# Patient Record
Sex: Female | Born: 1965 | Race: Black or African American | Hispanic: No | Marital: Married | Smoking: Never smoker
Health system: Southern US, Community
[De-identification: ages and names within clinical notes are randomized; demographics above are authoritative.]

## PROBLEM LIST (undated history)

## (undated) DIAGNOSIS — C73 Malignant neoplasm of thyroid gland: Secondary | ICD-10-CM

## (undated) DIAGNOSIS — T7840XA Allergy, unspecified, initial encounter: Secondary | ICD-10-CM

## (undated) DIAGNOSIS — E079 Disorder of thyroid, unspecified: Secondary | ICD-10-CM

## (undated) DIAGNOSIS — E039 Hypothyroidism, unspecified: Secondary | ICD-10-CM

## (undated) DIAGNOSIS — M25569 Pain in unspecified knee: Secondary | ICD-10-CM

## (undated) DIAGNOSIS — M1990 Unspecified osteoarthritis, unspecified site: Secondary | ICD-10-CM

## (undated) DIAGNOSIS — J45909 Unspecified asthma, uncomplicated: Secondary | ICD-10-CM

## (undated) DIAGNOSIS — IMO0001 Reserved for inherently not codable concepts without codable children: Secondary | ICD-10-CM

## (undated) DIAGNOSIS — R233 Spontaneous ecchymoses: Secondary | ICD-10-CM

## (undated) DIAGNOSIS — M797 Fibromyalgia: Secondary | ICD-10-CM

## (undated) DIAGNOSIS — G4730 Sleep apnea, unspecified: Secondary | ICD-10-CM

## (undated) DIAGNOSIS — R51 Headache: Secondary | ICD-10-CM

## (undated) DIAGNOSIS — M21619 Bunion of unspecified foot: Secondary | ICD-10-CM

## (undated) DIAGNOSIS — O009 Unspecified ectopic pregnancy without intrauterine pregnancy: Secondary | ICD-10-CM

## (undated) DIAGNOSIS — R238 Other skin changes: Secondary | ICD-10-CM

## (undated) DIAGNOSIS — R5383 Other fatigue: Secondary | ICD-10-CM

## (undated) DIAGNOSIS — M722 Plantar fascial fibromatosis: Secondary | ICD-10-CM

## (undated) DIAGNOSIS — C801 Malignant (primary) neoplasm, unspecified: Secondary | ICD-10-CM

## (undated) DIAGNOSIS — Z9289 Personal history of other medical treatment: Secondary | ICD-10-CM

## (undated) DIAGNOSIS — K219 Gastro-esophageal reflux disease without esophagitis: Secondary | ICD-10-CM

## (undated) DIAGNOSIS — D649 Anemia, unspecified: Secondary | ICD-10-CM

## (undated) HISTORY — PX: OTHER SURGICAL HISTORY: SHX169

## (undated) HISTORY — PX: ECTOPIC PREGNANCY SURGERY: SHX613

## (undated) HISTORY — DX: Bunion of unspecified foot: M21.619

## (undated) HISTORY — DX: Disorder of thyroid, unspecified: E07.9

## (undated) HISTORY — DX: Fibromyalgia: M79.7

## (undated) HISTORY — DX: Plantar fascial fibromatosis: M72.2

## (undated) HISTORY — DX: Malignant neoplasm of thyroid gland: C73

## (undated) HISTORY — PX: BUNIONECTOMY: SHX129

## (undated) HISTORY — DX: Unspecified osteoarthritis, unspecified site: M19.90

## (undated) HISTORY — DX: Reserved for inherently not codable concepts without codable children: IMO0001

## (undated) HISTORY — DX: Sleep apnea, unspecified: G47.30

## (undated) HISTORY — DX: Allergy, unspecified, initial encounter: T78.40XA

## (undated) HISTORY — DX: Gastro-esophageal reflux disease without esophagitis: K21.9

## (undated) HISTORY — PX: TUBAL LIGATION: SHX77

## (undated) HISTORY — DX: Other fatigue: R53.83

## (undated) HISTORY — DX: Pain in unspecified knee: M25.569

## (undated) HISTORY — PX: WISDOM TOOTH EXTRACTION: SHX21

## (undated) HISTORY — PX: COLONOSCOPY: SHX174

## (undated) HISTORY — DX: Other skin changes: R23.8

## (undated) HISTORY — DX: Spontaneous ecchymoses: R23.3

## (undated) HISTORY — DX: Unspecified ectopic pregnancy without intrauterine pregnancy: O00.90

---

## 1986-10-28 HISTORY — PX: DILATION AND CURETTAGE OF UTERUS: SHX78

## 1993-10-28 HISTORY — PX: THYROIDECTOMY: SHX17

## 1998-03-29 ENCOUNTER — Other Ambulatory Visit: Admission: RE | Admit: 1998-03-29 | Discharge: 1998-03-29 | Payer: Self-pay | Admitting: Obstetrics & Gynecology

## 1998-10-17 ENCOUNTER — Emergency Department (HOSPITAL_COMMUNITY): Admission: EM | Admit: 1998-10-17 | Discharge: 1998-10-17 | Payer: Self-pay | Admitting: Emergency Medicine

## 1998-10-28 DIAGNOSIS — Z9289 Personal history of other medical treatment: Secondary | ICD-10-CM

## 1998-10-28 HISTORY — DX: Personal history of other medical treatment: Z92.89

## 1999-01-15 ENCOUNTER — Ambulatory Visit (HOSPITAL_COMMUNITY): Admission: RE | Admit: 1999-01-15 | Discharge: 1999-01-15 | Payer: Self-pay | Admitting: Endocrinology

## 1999-01-17 ENCOUNTER — Ambulatory Visit (HOSPITAL_COMMUNITY): Admission: RE | Admit: 1999-01-17 | Discharge: 1999-01-17 | Payer: Self-pay | Admitting: Endocrinology

## 1999-01-18 ENCOUNTER — Encounter: Payer: Self-pay | Admitting: Endocrinology

## 1999-01-19 ENCOUNTER — Ambulatory Visit (HOSPITAL_COMMUNITY): Admission: RE | Admit: 1999-01-19 | Discharge: 1999-01-19 | Payer: Self-pay | Admitting: Endocrinology

## 1999-01-19 ENCOUNTER — Encounter: Payer: Self-pay | Admitting: Endocrinology

## 1999-01-23 ENCOUNTER — Ambulatory Visit (HOSPITAL_COMMUNITY): Admission: RE | Admit: 1999-01-23 | Discharge: 1999-01-23 | Payer: Self-pay | Admitting: General Surgery

## 1999-01-23 ENCOUNTER — Encounter: Payer: Self-pay | Admitting: General Surgery

## 1999-04-12 ENCOUNTER — Other Ambulatory Visit: Admission: RE | Admit: 1999-04-12 | Discharge: 1999-04-12 | Payer: Self-pay | Admitting: Obstetrics & Gynecology

## 1999-08-06 ENCOUNTER — Ambulatory Visit (HOSPITAL_COMMUNITY): Admission: RE | Admit: 1999-08-06 | Discharge: 1999-08-06 | Payer: Self-pay | Admitting: Endocrinology

## 1999-08-10 ENCOUNTER — Ambulatory Visit (HOSPITAL_COMMUNITY): Admission: RE | Admit: 1999-08-10 | Discharge: 1999-08-10 | Payer: Self-pay | Admitting: Endocrinology

## 1999-08-10 ENCOUNTER — Encounter: Payer: Self-pay | Admitting: Endocrinology

## 2000-07-15 ENCOUNTER — Ambulatory Visit (HOSPITAL_COMMUNITY): Admission: RE | Admit: 2000-07-15 | Discharge: 2000-07-15 | Payer: Self-pay | Admitting: Obstetrics and Gynecology

## 2000-07-15 ENCOUNTER — Encounter: Payer: Self-pay | Admitting: Obstetrics and Gynecology

## 2000-08-01 ENCOUNTER — Encounter: Payer: Self-pay | Admitting: Obstetrics and Gynecology

## 2000-08-01 ENCOUNTER — Ambulatory Visit (HOSPITAL_COMMUNITY): Admission: RE | Admit: 2000-08-01 | Discharge: 2000-08-01 | Payer: Self-pay | Admitting: Obstetrics and Gynecology

## 2000-08-28 ENCOUNTER — Other Ambulatory Visit: Admission: RE | Admit: 2000-08-28 | Discharge: 2000-08-28 | Payer: Self-pay | Admitting: Obstetrics and Gynecology

## 2000-09-15 ENCOUNTER — Encounter: Payer: Self-pay | Admitting: Endocrinology

## 2000-09-15 ENCOUNTER — Ambulatory Visit (HOSPITAL_COMMUNITY): Admission: RE | Admit: 2000-09-15 | Discharge: 2000-09-15 | Payer: Self-pay | Admitting: Endocrinology

## 2001-09-01 ENCOUNTER — Emergency Department (HOSPITAL_COMMUNITY): Admission: EM | Admit: 2001-09-01 | Discharge: 2001-09-01 | Payer: Self-pay | Admitting: Emergency Medicine

## 2003-04-04 ENCOUNTER — Encounter: Payer: Self-pay | Admitting: Obstetrics and Gynecology

## 2003-04-04 ENCOUNTER — Ambulatory Visit (HOSPITAL_COMMUNITY): Admission: RE | Admit: 2003-04-04 | Discharge: 2003-04-04 | Payer: Self-pay | Admitting: Obstetrics and Gynecology

## 2003-05-04 ENCOUNTER — Ambulatory Visit (HOSPITAL_COMMUNITY): Admission: RE | Admit: 2003-05-04 | Discharge: 2003-05-04 | Payer: Self-pay | Admitting: Obstetrics and Gynecology

## 2003-05-04 ENCOUNTER — Encounter: Payer: Self-pay | Admitting: Obstetrics and Gynecology

## 2003-05-25 ENCOUNTER — Other Ambulatory Visit: Admission: RE | Admit: 2003-05-25 | Discharge: 2003-05-25 | Payer: Self-pay | Admitting: Obstetrics and Gynecology

## 2003-06-28 ENCOUNTER — Emergency Department (HOSPITAL_COMMUNITY): Admission: EM | Admit: 2003-06-28 | Discharge: 2003-06-29 | Payer: Self-pay | Admitting: Emergency Medicine

## 2003-06-29 ENCOUNTER — Encounter: Payer: Self-pay | Admitting: Emergency Medicine

## 2004-08-06 ENCOUNTER — Encounter (HOSPITAL_COMMUNITY): Admission: RE | Admit: 2004-08-06 | Discharge: 2004-11-04 | Payer: Self-pay | Admitting: Surgery

## 2004-08-31 ENCOUNTER — Ambulatory Visit (HOSPITAL_COMMUNITY): Admission: RE | Admit: 2004-08-31 | Discharge: 2004-08-31 | Payer: Self-pay | Admitting: Obstetrics and Gynecology

## 2004-12-13 ENCOUNTER — Emergency Department (HOSPITAL_COMMUNITY): Admission: EM | Admit: 2004-12-13 | Discharge: 2004-12-13 | Payer: Self-pay | Admitting: Emergency Medicine

## 2004-12-21 ENCOUNTER — Ambulatory Visit: Payer: Self-pay | Admitting: Internal Medicine

## 2005-01-15 ENCOUNTER — Ambulatory Visit: Payer: Self-pay | Admitting: Internal Medicine

## 2006-07-28 ENCOUNTER — Encounter (HOSPITAL_COMMUNITY): Admission: RE | Admit: 2006-07-28 | Discharge: 2006-10-26 | Payer: Self-pay | Admitting: Endocrinology

## 2008-10-13 ENCOUNTER — Telehealth: Payer: Self-pay | Admitting: Internal Medicine

## 2008-10-14 ENCOUNTER — Ambulatory Visit: Payer: Self-pay | Admitting: Gastroenterology

## 2008-10-14 DIAGNOSIS — R198 Other specified symptoms and signs involving the digestive system and abdomen: Secondary | ICD-10-CM | POA: Insufficient documentation

## 2008-10-14 DIAGNOSIS — K625 Hemorrhage of anus and rectum: Secondary | ICD-10-CM | POA: Insufficient documentation

## 2008-10-24 ENCOUNTER — Telehealth: Payer: Self-pay | Admitting: Internal Medicine

## 2008-10-26 ENCOUNTER — Ambulatory Visit: Payer: Self-pay | Admitting: Internal Medicine

## 2008-10-26 ENCOUNTER — Encounter: Payer: Self-pay | Admitting: Internal Medicine

## 2008-10-26 DIAGNOSIS — D649 Anemia, unspecified: Secondary | ICD-10-CM | POA: Insufficient documentation

## 2008-11-02 LAB — CONVERTED CEMR LAB
Basophils Absolute: 0 10*3/uL (ref 0.0–0.1)
Basophils Relative: 0 % (ref 0.0–3.0)
Eosinophils Absolute: 0.1 10*3/uL (ref 0.0–0.7)
Eosinophils Relative: 0.8 % (ref 0.0–5.0)
HCT: 30.7 % — ABNORMAL LOW (ref 36.0–46.0)
Hemoglobin: 9.9 g/dL — ABNORMAL LOW (ref 12.0–15.0)
Lymphocytes Relative: 27.3 % (ref 12.0–46.0)
MCHC: 32.3 g/dL (ref 30.0–36.0)
MCV: 70.9 fL — ABNORMAL LOW (ref 78.0–100.0)
Monocytes Absolute: 0.4 10*3/uL (ref 0.1–1.0)
Monocytes Relative: 3.9 % (ref 3.0–12.0)
Neutro Abs: 7.6 10*3/uL (ref 1.4–7.7)
Neutrophils Relative %: 68 % (ref 43.0–77.0)
Platelets: 389 10*3/uL (ref 150–400)
RBC: 4.33 M/uL (ref 3.87–5.11)
RDW: 17.7 % — ABNORMAL HIGH (ref 11.5–14.6)
WBC: 11.2 10*3/uL — ABNORMAL HIGH (ref 4.5–10.5)

## 2008-11-03 ENCOUNTER — Encounter: Payer: Self-pay | Admitting: Internal Medicine

## 2008-11-08 ENCOUNTER — Telehealth: Payer: Self-pay | Admitting: Internal Medicine

## 2008-11-10 ENCOUNTER — Ambulatory Visit: Payer: Self-pay | Admitting: Internal Medicine

## 2008-11-22 ENCOUNTER — Telehealth: Payer: Self-pay | Admitting: Internal Medicine

## 2008-11-24 ENCOUNTER — Ambulatory Visit: Payer: Self-pay | Admitting: Internal Medicine

## 2008-11-24 ENCOUNTER — Encounter: Payer: Self-pay | Admitting: Internal Medicine

## 2009-12-26 ENCOUNTER — Encounter: Admission: RE | Admit: 2009-12-26 | Discharge: 2009-12-26 | Payer: Self-pay | Admitting: Internal Medicine

## 2009-12-28 ENCOUNTER — Encounter: Admission: RE | Admit: 2009-12-28 | Discharge: 2009-12-28 | Payer: Self-pay | Admitting: Internal Medicine

## 2010-11-17 ENCOUNTER — Encounter: Payer: Self-pay | Admitting: Surgery

## 2010-11-18 ENCOUNTER — Encounter: Payer: Self-pay | Admitting: Endocrinology

## 2011-01-16 ENCOUNTER — Other Ambulatory Visit: Payer: Self-pay | Admitting: Internal Medicine

## 2011-01-16 DIAGNOSIS — Z1231 Encounter for screening mammogram for malignant neoplasm of breast: Secondary | ICD-10-CM

## 2011-01-23 ENCOUNTER — Ambulatory Visit: Payer: Self-pay

## 2011-02-08 ENCOUNTER — Inpatient Hospital Stay: Admission: RE | Admit: 2011-02-08 | Payer: Self-pay | Source: Ambulatory Visit

## 2011-05-29 DIAGNOSIS — M722 Plantar fascial fibromatosis: Secondary | ICD-10-CM

## 2011-07-02 ENCOUNTER — Other Ambulatory Visit: Payer: Self-pay | Admitting: Orthopedic Surgery

## 2011-07-02 DIAGNOSIS — M25561 Pain in right knee: Secondary | ICD-10-CM

## 2011-07-07 ENCOUNTER — Ambulatory Visit
Admission: RE | Admit: 2011-07-07 | Discharge: 2011-07-07 | Disposition: A | Discharge status: Home / Self Care | Payer: BC Managed Care – PPO | Source: Ambulatory Visit | Attending: Orthopedic Surgery | Admitting: Orthopedic Surgery

## 2011-07-07 DIAGNOSIS — M25561 Pain in right knee: Secondary | ICD-10-CM

## 2011-08-21 ENCOUNTER — Other Ambulatory Visit: Payer: Self-pay | Admitting: Internal Medicine

## 2011-08-21 DIAGNOSIS — N644 Mastodynia: Secondary | ICD-10-CM

## 2011-08-30 ENCOUNTER — Ambulatory Visit (INDEPENDENT_AMBULATORY_CARE_PROVIDER_SITE_OTHER): Payer: BC Managed Care – PPO | Admitting: Surgery

## 2011-08-30 NOTE — Progress Notes (Signed)
Chief Complaint:  Morbid obesity, BMI 45 with DM  History of Present Illness:  Dana Vasquez is an 45 y.o. female referred by Dr. Kellie Shropshire for Roux-en-Y gastric bypass. Ms. Alen has been to one of our some enlarged and has done research on this and seems very well versed in the procedure. I discussed in some detail which she had a good comprehension and was to proceed with a workup for scheduling.  Her history of obesity may have been augmented by her bout with thyroid cancer requiring radioactive iodine ablation and subsequent thyroidectomy leaving her hypothyroid. Her surgeon was Dr. Gerrit Friends.  She also had a ruptured tubal pregnancy in 2001 requiring left tubal removal. As her only abdominal surgery.   Past Medical History  Diagnosis Date  . Fibromyalgia   . Euthyroid   . Fatigue   . GERD (gastroesophageal reflux disease)   . Bruises easily     Past Surgical History  Procedure Date  . Tubal ligation   . Fallopian tubes removed   . Thyroidectomy     Medications Prior to Admission  Medication Sig Dispense Refill  . Ferrous Sulfate (IRON CR PO) Take by mouth.        . Levothyroxine Sodium (SYNTHROID PO) Take by mouth.        . Nutritional Supplements (VITAMIN D BOOSTER PO) Take by mouth.         No current facility-administered medications on file as of 08/30/2011.     Allergies no known allergies   No family history on file.  Social History:   has an unknown smoking status. She does not have any smokeless tobacco history on file. She reports that she does not drink alcohol. Her drug history not on file.   REVIEW OF SYSTEMS - PERTINENT POSITIVES ONLY: Review of systems is positive for weight change, loss of sleep and fatigue, breast pain, high blood pressure, bronchitis, shortness of breath, diabetes, thyroid disease, hiatal hernia with reflux, rectal bleeding or hemorrhoids, headaches, anemia, prior thyroid cancer, sinus problems runny nose, hoarseness, glasses,  obstructive sleep apnea requiring CPAP machine.  Physical Exam:   Blood pressure 122/82, pulse 88, temperature 97.4 F (36.3 C), temperature source Temporal, height 5\' 5"  (1.651 m), weight 270 lb 6 oz (122.641 kg). Body mass index is 44.99 kg/(m^2).  Gen:  No acute distress.  Well nourished and well groomed.   Neurological: Alert and oriented to person, place, and time. Coordination normal.  Head: Normocephalic and atraumatic.  Eyes: Conjunctivae are normal. Pupils are equal, round, and reactive to light. No scleral icterus.  Neck: Normal range of motion. Neck supple. No tracheal deviation or thyromegaly present.  Cardiovascular: Normal rate, regular rhythm, normal heart sounds and intact distal pulses.  Exam reveals no gallop and no friction rub.  No murmur heard. Respiratory: Effort normal.  No respiratory distress. No chest wall tenderness. Breath sounds normal.  No wheezes, rales or rhonchi.  GI: Soft. Bowel sounds are normal. The abdomen is soft and nontender.  There is no rebound and no guarding.  Musculoskeletal: Normal range of motion. Extremities are nontender.  Lymphadenopathy: No cervical, preauricular, postauricular or axillary adenopathy is present Skin: Skin is warm and dry. No rash noted. No diaphoresis. No erythema. No pallor. No clubbing, cyanosis, or edema.   Psychiatric: Normal mood and affect. Behavior is normal. Judgment and thought content normal.    LABORATORY RESULTS: No results found for this or any previous visit (from the past 48 hour(s)).  RADIOLOGY  RESULTS: No results found.  Problem List: Active Problems:  * No active hospital problems. *    Assessment & Plan: Morbid obesity with multiple comorbidities. Plan begin workup for Roux-en-Y gastric bypass.    Matt B. Daphine Deutscher, MD, [facility] [phone] beeper [phone]  08/30/2011 5:48 PM

## 2011-09-02 ENCOUNTER — Other Ambulatory Visit (INDEPENDENT_AMBULATORY_CARE_PROVIDER_SITE_OTHER): Payer: Self-pay | Admitting: Surgery

## 2011-09-10 ENCOUNTER — Encounter: Payer: Self-pay | Admitting: *Deleted

## 2011-09-10 ENCOUNTER — Telehealth (INDEPENDENT_AMBULATORY_CARE_PROVIDER_SITE_OTHER): Payer: Self-pay | Admitting: Surgery

## 2011-09-10 ENCOUNTER — Encounter: Payer: BC Managed Care – PPO | Attending: Surgery | Admitting: *Deleted

## 2011-09-10 DIAGNOSIS — Z01818 Encounter for other preprocedural examination: Secondary | ICD-10-CM | POA: Insufficient documentation

## 2011-09-10 DIAGNOSIS — Z713 Dietary counseling and surveillance: Secondary | ICD-10-CM | POA: Insufficient documentation

## 2011-09-10 NOTE — Patient Instructions (Signed)
   Follow Pre-Op Nutrition Goals to prepare for Gastric Bypass Surgery.   Call the [facility] at [phone] once you have been given your surgery date to enrolled in the Pre-Op Nutrition Class. You will need to attend this nutrition class 3-4 weeks prior to your surgery. 

## 2011-09-10 NOTE — Telephone Encounter (Signed)
Hi Trace/Dr Daphine Deutscher,  I received the following email from the patient. I advised pt that the nutritionist will go over many of the items mentioned concerning meal plans/sizes, eating habits, weight loss goals, etc. Also advised the patient that we will be more than happy to schedule an appt with Dr Daphine Deutscher for her to discuss the consent and the concerns she lists in the email if she would like. Or, she may ask all of those questions at her pre-op appt and then sign the consent form. I also advised the patient that the surgeons normally let the patient decide which procedure they prefer because they have to live with it for the rest of their lives. Adv pt to call if she would like to set up an appt with Dr. Daphine Deutscher prior to pre-op. Okey Regal  From: [email or web address] Sent: Monday, September 09, 2011 7:25 AM To: Leandrew Koyanagi Subject: RE: Appointment Sheet  [name],  I did reschedule and I also received the paperwork in the mail. I do, however, have some concerns. I do not feel comfortable initialing the second section of the Consent Form as yet. I did have an appointment with Dr. Daphine Deutscher a couple weeks ago, but we did not discuss any of the items mentioned in detail. In fact, my time with him was all of 10 minutes after being there at least 3 hours. I was very disappointed about the visit. Was this the time that we should have discussed the items listed on the consent form? Dr. Daphine Deutscher did thank me for a thorough medical history and asked me if I had any questions, but I had been very anxious for the opportunity for someone to personnally address me as to how this surgery may or may not be my best choice for weight loss. I wasn't asked why I chose the gastric bypass; nor was any other recommendations made or presented as it pertained to ME. We did not discuss my eating habits, my future weight loss goals, personal limits regarding acceptable meal size, or other options that are  non-surgical other than the fact that I've probably tried almost every diet or weight-reducing plan of which I know.  This is a major decision for me and I want to make sure that it's the right one for me. I've read a lot and have attended the information session at [facility]. I understand the generalities, but just want my Surgeon to be able to understand my individual plight.  Please advise.  Sincerely, Dana Vasquez

## 2011-09-10 NOTE — Progress Notes (Signed)
  Pre-Op Assessment Visit: Pre-Operative Gastric Bypass Surgery  Medical Nutrition Therapy:  Appt start time: 1730 end time:  1830.  Patient was seen on 09/10/2011 for Pre-Operative Gastric Bypass Nutrition Assessment. Assessment and letter of approval faxed to [facility] B[facility]ric Surgery Program coordinator on 09/10/2011.  Approval letter sent to [facility] center and will be available in the chart under the media tab.  Handouts given during visit include:  Pre-Op Goals Handout  Patient to call for Pre-Op and Post-Op Nutrition Education at the [facility] when surgery is scheduled.

## 2011-09-12 ENCOUNTER — Ambulatory Visit: Payer: BC Managed Care – PPO | Admitting: *Deleted

## 2011-09-18 ENCOUNTER — Encounter (HOSPITAL_COMMUNITY)
Admission: RE | Disposition: A | Discharge status: Home / Self Care | Payer: Self-pay | Source: Ambulatory Visit | Attending: Surgery

## 2011-09-18 ENCOUNTER — Ambulatory Visit (HOSPITAL_COMMUNITY)
Admission: RE | Admit: 2011-09-18 | Discharge: 2011-09-18 | Disposition: A | Discharge status: Home / Self Care | Payer: BC Managed Care – PPO | Source: Ambulatory Visit | Attending: Surgery | Admitting: Surgery

## 2011-09-18 DIAGNOSIS — Z01818 Encounter for other preprocedural examination: Secondary | ICD-10-CM | POA: Insufficient documentation

## 2011-09-18 HISTORY — PX: BREATH TEK H PYLORI: SHX5422

## 2011-09-18 SURGERY — BREATH TEST, FOR HELICOBACTER PYLORI
Anesthesia: Choice

## 2011-09-23 ENCOUNTER — Encounter (HOSPITAL_COMMUNITY): Payer: Self-pay

## 2011-09-23 ENCOUNTER — Ambulatory Visit (HOSPITAL_COMMUNITY)
Admission: RE | Admit: 2011-09-23 | Discharge: 2011-09-23 | Disposition: A | Discharge status: Home / Self Care | Payer: BC Managed Care – PPO | Source: Ambulatory Visit | Attending: Surgery | Admitting: Surgery

## 2011-09-23 ENCOUNTER — Other Ambulatory Visit: Payer: Self-pay

## 2011-09-23 DIAGNOSIS — K449 Diaphragmatic hernia without obstruction or gangrene: Secondary | ICD-10-CM | POA: Insufficient documentation

## 2011-09-23 DIAGNOSIS — Z6841 Body Mass Index (BMI) 40.0 and over, adult: Secondary | ICD-10-CM | POA: Insufficient documentation

## 2011-09-23 DIAGNOSIS — IMO0001 Reserved for inherently not codable concepts without codable children: Secondary | ICD-10-CM | POA: Insufficient documentation

## 2011-09-23 DIAGNOSIS — K219 Gastro-esophageal reflux disease without esophagitis: Secondary | ICD-10-CM | POA: Insufficient documentation

## 2011-09-23 DIAGNOSIS — G4730 Sleep apnea, unspecified: Secondary | ICD-10-CM | POA: Insufficient documentation

## 2011-09-23 DIAGNOSIS — R7309 Other abnormal glucose: Secondary | ICD-10-CM | POA: Insufficient documentation

## 2011-09-24 ENCOUNTER — Encounter (HOSPITAL_COMMUNITY): Payer: Self-pay | Admitting: Surgery

## 2011-10-03 ENCOUNTER — Ambulatory Visit
Admission: RE | Admit: 2011-10-03 | Discharge: 2011-10-03 | Disposition: A | Discharge status: Home / Self Care | Payer: BC Managed Care – PPO | Source: Ambulatory Visit | Attending: Internal Medicine | Admitting: Internal Medicine

## 2011-10-03 DIAGNOSIS — N644 Mastodynia: Secondary | ICD-10-CM

## 2011-10-10 ENCOUNTER — Other Ambulatory Visit (INDEPENDENT_AMBULATORY_CARE_PROVIDER_SITE_OTHER): Payer: Self-pay | Admitting: Surgery

## 2011-10-10 LAB — COMPREHENSIVE METABOLIC PANEL
AST: 11 U/L (ref 0–37)
Albumin: 4 g/dL (ref 3.5–5.2)
Alkaline Phosphatase: 65 U/L (ref 39–117)
BUN: 14 mg/dL (ref 6–23)
Potassium: 4.3 mEq/L (ref 3.5–5.3)

## 2011-10-10 LAB — LIPID PANEL
HDL: 45 mg/dL (ref >39)
LDL Cholesterol: 113 mg/dL — ABNORMAL HIGH (ref 0–99)
Total CHOL/HDL Ratio: 3.7 Ratio

## 2011-10-10 LAB — T4: T4, Total: 10.8 ug/dL (ref 5.0–12.5)

## 2011-10-11 LAB — CBC WITH DIFFERENTIAL/PLATELET
Basophils Absolute: 0 10*3/uL (ref 0.0–0.1)
Basophils Relative: 0 % (ref 0–1)
HCT: 31.6 % — ABNORMAL LOW (ref 36.0–46.0)
MCHC: 31 g/dL (ref 30.0–36.0)
Monocytes Absolute: 1 10*3/uL (ref 0.1–1.0)
Neutro Abs: 7.9 10*3/uL — ABNORMAL HIGH (ref 1.7–7.7)
Neutrophils Relative %: 69 % (ref 43–77)
Platelets: 477 10*3/uL — ABNORMAL HIGH (ref 150–400)
RDW: 19.3 % — ABNORMAL HIGH (ref 11.5–15.5)

## 2011-10-11 LAB — HEMOGLOBIN A1C: Mean Plasma Glucose: 131 mg/dL — ABNORMAL HIGH (ref <117)

## 2011-10-18 ENCOUNTER — Ambulatory Visit (INDEPENDENT_AMBULATORY_CARE_PROVIDER_SITE_OTHER): Payer: BC Managed Care – PPO | Admitting: General Surgery

## 2011-10-18 ENCOUNTER — Encounter (INDEPENDENT_AMBULATORY_CARE_PROVIDER_SITE_OTHER): Payer: Self-pay | Admitting: General Surgery

## 2011-10-18 LAB — IRON AND TIBC
%SAT: 7 % — ABNORMAL LOW (ref 20–55)
TIBC: 339 ug/dL (ref 250–470)

## 2011-10-18 NOTE — Progress Notes (Signed)
Patient ID: Dana Vasquez, female   DOB: 08/19/1966, 45 y.o.   MRN: [id number]  Chief Complaint  Patient presents with  . Other    Eval for Gastric Sleeve    HPI Dana Vasquez is a 45 y.o. female. This patient presents for evaluation for weight loss surgery. She has a BMI of 45 and originally saw Dr. Daphine Deutscher for possible Roux-en-Y gastric bypass. After further research she is interested in the sleeve gastrectomy. She has multiple medical problems and her main concern for the gastric bypass is her history of iron deficiency anemia. Her hemoglobin runs from the 7-9 range. She takes iron supplements now as well as vitamin D injections and is concerned that with gastric bypass her iron levels might be too low. She has attended our weight loss surgery similar and has done some independent research. She is not interested in the LAP-BAND because she is concerned of its lack of effectiveness. She's not currently exercising although she has tried multiple diet attempts. She is basically ready for surgery and his argument with a psychologist in the nutritionist. HPI  Past Medical History  Diagnosis Date  . Fibromyalgia   . Euthyroid   . Fatigue   . GERD (gastroesophageal reflux disease)   . Bruises easily   . Sleep apnea   . Knee pain   . Arthritis     Past Surgical History  Procedure Date  . Tubal ligation   . Fallopian tubes removed   . Thyroidectomy   . Breath tek h pylori 09/18/2011    Procedure: BREATH TEK H PYLORI;  Surgeon: Valarie Merino, MD;  Location: [facility];  Service: General;  Laterality: N/A;    No family history on file.  Social History History  Substance Use Topics  . Smoking status: Never Smoker   . Smokeless tobacco: Not on file  . Alcohol Use: No    No Known Allergies  Current Outpatient Prescriptions  Medication Sig Dispense Refill  . Ferrous Sulfate (IRON CR PO) Take by mouth.        . Levothyroxine Sodium (SYNTHROID PO) Take by mouth.        .  Nutritional Supplements (VITAMIN D BOOSTER PO) Take by mouth.          Review of Systems Review of Systems  Blood pressure 148/92, pulse 72, temperature 97.1 F (36.2 C), temperature source Temporal, resp. rate 18, height 5\' 5"  (1.651 m), weight 268 lb 6.4 oz (121.745 kg), last menstrual period 09/22/2011.  Physical Exam Physical Exam  Vitals reviewed. Constitutional: She appears well-developed and well-nourished. No distress.  HENT:  Head: Normocephalic and atraumatic.  Mouth/Throat: No oropharyngeal exudate.  Eyes: Conjunctivae are normal. Pupils are equal, round, and reactive to light. Right eye exhibits no discharge. Left eye exhibits no discharge. No scleral icterus.  Neck: Normal range of motion. No tracheal deviation present.       Prior thyroidectomy scar   Cardiovascular: Normal rate.   Pulmonary/Chest: Effort normal. No stridor. No respiratory distress. She has no wheezes.  Abdominal: Soft. She exhibits no distension. There is no tenderness. There is no rebound and no guarding.  Musculoskeletal: Normal range of motion. She exhibits no edema.  Neurological: She is alert. No cranial nerve deficit. Coordination normal.  Skin: Skin is warm and dry. No rash noted. She is not diaphoretic. No erythema. No pallor.  Psychiatric: She has a normal mood and affect. Her behavior is normal. Judgment and thought content normal.  Data Reviewed   Assessment    Morbid obesity with sleep apnea and hypothyroidism. She states that she is borderline diabetic and borderline hypertensive. I think that she would be a fine candidate for any of the procedures including the lap band, or sleeve gastrectomy.she would likely be fine with gastric bypass as well. Her concerns are legitimate about her prior iron deficiency anemia and this certainly could be exacerbated by any of the weight loss procedures and with the greatest effect from the gastric bypass. She has very mild reflux she says she has  reflux symptoms approximately once per month and she does not take anything routinely for this. I have offered her sleeve gastrectomy or lap bandor even a gastric bypass. I think that she is most interested in the sleeve gastrectomy.    Plan    I recommended that she do some additional research in order to help make a decision and continue with preoperative workup. She already has had an upper GI with a small hiatal hernia which should not affect her choice of surgery of procedures since this would be easily repaired at the time of surgery. I will also check an iron profile and vitamin D levels in preparation for surgery to think that's again she should be adequate for a sleeve gastrectomy if she desires. I did explain the risks of the surgery including infection, bleeding, pain, scarring, need for open surgery, injury to the spleen or straining structures, risk of leaks and benefits, stricture, reflux and the need for possible medication or conversion to Roux-en-Y gastric bypass, and the need for future surgeries. We also discussed the irreversible nature of this procedure and we will continue with the workup and she desires to proceed with sleeve gastrectomy we will schedule this when her further workup is complete.       Lodema Pilot DAVID 10/18/2011, 4:16 PM

## 2011-11-06 ENCOUNTER — Other Ambulatory Visit (INDEPENDENT_AMBULATORY_CARE_PROVIDER_SITE_OTHER): Payer: Self-pay | Admitting: General Surgery

## 2011-11-14 ENCOUNTER — Ambulatory Visit: Payer: BC Managed Care – PPO

## 2012-02-12 ENCOUNTER — Ambulatory Visit (INDEPENDENT_AMBULATORY_CARE_PROVIDER_SITE_OTHER): Payer: PRIVATE HEALTH INSURANCE | Admitting: Obstetrics and Gynecology

## 2012-02-12 ENCOUNTER — Encounter: Payer: Self-pay | Admitting: Obstetrics and Gynecology

## 2012-02-12 VITALS — BP 122/80 | HR 76 | Wt 264.0 lb

## 2012-02-12 DIAGNOSIS — N920 Excessive and frequent menstruation with regular cycle: Secondary | ICD-10-CM | POA: Insufficient documentation

## 2012-02-12 DIAGNOSIS — D219 Benign neoplasm of connective and other soft tissue, unspecified: Secondary | ICD-10-CM

## 2012-02-12 DIAGNOSIS — Z833 Family history of diabetes mellitus: Secondary | ICD-10-CM

## 2012-02-12 DIAGNOSIS — Z8759 Personal history of other complications of pregnancy, childbirth and the puerperium: Secondary | ICD-10-CM | POA: Insufficient documentation

## 2012-02-12 DIAGNOSIS — D259 Leiomyoma of uterus, unspecified: Secondary | ICD-10-CM

## 2012-02-12 NOTE — Patient Instructions (Signed)

## 2012-02-12 NOTE — Progress Notes (Signed)
The patient reports:she wantsf to discuss the removal of the iud.  Contraception:Tubal ligation  Last mammogram: was normal December 2012 Last pap: was normal August  2012  GC/Chlamydia cultures offered: declined HIV/RPR/HbsAg offered:  declined HSV 1 and 2 glycoprotein offered: declined  Menstrual cycle regular and monthly: Yes Menstrual flow normal: No to long  Urinary symptoms: none Normal bowel movements: No Reports abuse at home: No:   Pt presents to followup menorrhagia treated with Mirena.  Menses have been much lighter, but March menses lasted almost 2 weeks though the bleeding was light.  She expressed concerns about blood clots as a Mirena side effect, but confused menstual clots with DVT.  We discussed the risks and benefits of hysterectomy as an option for management of menorrhagia.  Exam:  IUD string visible at the cervix             Uterus enlarged, mobile and nontender.Exam compromised by body habitus             Adnexa no masses Assessment:  IUD in place.                         Improved menorrhagia                         Known side effect of irregular bleeding, but also improved. Plan:  Observe for 3 additional months            FU at that time

## 2013-02-15 ENCOUNTER — Other Ambulatory Visit: Payer: Self-pay

## 2013-02-15 DIAGNOSIS — Z1231 Encounter for screening mammogram for malignant neoplasm of breast: Secondary | ICD-10-CM

## 2013-03-11 ENCOUNTER — Ambulatory Visit: Payer: Self-pay

## 2013-10-14 ENCOUNTER — Ambulatory Visit: Payer: Self-pay

## 2013-10-18 ENCOUNTER — Ambulatory Visit: Payer: Self-pay

## 2013-10-26 ENCOUNTER — Ambulatory Visit: Payer: Self-pay

## 2014-03-08 ENCOUNTER — Other Ambulatory Visit: Payer: Self-pay

## 2014-03-08 DIAGNOSIS — Z1231 Encounter for screening mammogram for malignant neoplasm of breast: Secondary | ICD-10-CM

## 2014-03-10 ENCOUNTER — Ambulatory Visit
Admission: RE | Admit: 2014-03-10 | Discharge: 2014-03-10 | Disposition: A | Discharge status: Home / Self Care | Payer: BC Managed Care – PPO | Source: Ambulatory Visit

## 2014-03-10 ENCOUNTER — Encounter (INDEPENDENT_AMBULATORY_CARE_PROVIDER_SITE_OTHER): Payer: Self-pay

## 2014-03-10 DIAGNOSIS — Z1231 Encounter for screening mammogram for malignant neoplasm of breast: Secondary | ICD-10-CM

## 2014-04-07 ENCOUNTER — Other Ambulatory Visit: Payer: Self-pay | Admitting: Obstetrics and Gynecology

## 2014-04-12 ENCOUNTER — Other Ambulatory Visit: Payer: Self-pay | Admitting: Obstetrics and Gynecology

## 2014-04-13 ENCOUNTER — Encounter: Payer: BC Managed Care – PPO | Attending: Endocrinology | Admitting: Dietician

## 2014-04-13 ENCOUNTER — Encounter: Payer: Self-pay | Admitting: Dietician

## 2014-04-13 VITALS — Ht 64.0 in | Wt 263.5 lb

## 2014-04-13 DIAGNOSIS — Z713 Dietary counseling and surveillance: Secondary | ICD-10-CM | POA: Insufficient documentation

## 2014-04-13 DIAGNOSIS — E119 Type 2 diabetes mellitus without complications: Secondary | ICD-10-CM | POA: Diagnosis not present

## 2014-04-13 NOTE — Patient Instructions (Addendum)
   Your procedure is scheduled on:  Wednesday, June 24  Enter through the [employer] [facility] at: 10:25 AM Pick up the phone at the desk and dial [phone] and inform us of your arrival.  Please call this number if you have any problems the morning of surgery: [phone]  Remember: Do not eat food after midnight: Tuesday Do not drink clear liquids after: 7:30 AM Wednesday, day of surgery Take these medicines the morning of surgery with a SIP OF WATER: synthroid, lorsartan, liothyronine. Bring albuterol inhaler with you on day of surgery. Bring CPAP machine.  Do not wear jewelry, make-up, or FINGER nail polish No metal in your hair or on your body. Do not wear lotions, powders, perfumes.  You may wear deodorant.  Do not bring valuables to the hospital. Contacts, dentures or bridgework may not be worn into surgery.  Leave suitcase in the car. After Surgery it may be brought to your room. For patients being admitted to the hospital, checkout time is 11:00am the day of discharge.  Hme with husband Mr. Chaunte Hornbeck cell [phone].

## 2014-04-13 NOTE — Progress Notes (Signed)
Appt start time: 0800 end time:  0900.  Assessment:  Patient was seen on 04/13/14 for individual diabetes education. Pt reports with newly Dx T2DM with very mild HgbA1c increase. She has lost weight and made a number of changes since seeing Dr. Chalmers Cater, but seems to have gotten some confusion about her daily CHO needs and goals. MedHx also sig for thyroidectomy controlled by synthroid and liothyronine.  Current HbA1c: 6.5  Preferred Learning Style:   No preference indicated   Learning Readiness:   Change in progress  MEDICATIONS: see list.  DIETARY INTAKE: Usual eating pattern includes 2 meals and 2-3 snacks per day. Everyday foods include chix, pork rinds, nuts.  Avoided foods include sweet tea, juices.    24-hr recall:  B ( AM): most days none. Used to drink lots of juice, but has d/c'ed this recently.  Sometimes will have V8 juice. When eating, likes eggs and cheese, [place] bacon, grits but usually no more than once per week.  Snk ( AM): pork rinds are a recent snack due to low CHO content. Used to enjoy lots of white cheddar popcorn (essentially a whole bag in a sitting), but has stopped this recently. Many nuts, particularly cashews.  Used to drink lots of sweet tea, now switched to unsweet.  L ( PM): chix salad on one slice bread. At [employer], roast chix pieces, with green beans and pinto beans in favor of french fries. Fish filet from [employer] with no sides. This is usually eaten around 10 or 11. Shrimp cocktail.  Snk ( PM): similar to previous snack.  D ( PM): sometimes will go out to [employer] for similar to lunch, may get salad from [employer] or [employer]. Main protein at home is chix. Used to avoid red meat, but has had some lately, like bacon or hibachi steak and shrimp carry out.  Snk ( PM): none most days. Grapefruit.  Beverages: unsweet tea (used to drink lots of sweet tea), rarely coffee, no juice lately, no kool aid or lemonade, EtOH very infrequently.   Usual physical  activity: gym 1.5-2 hours including 30 minutes of cardio, weight training with personal trainer 5 days per week. This begun 2.5 weeks ago.   Progress Towards Goal(s):  In progress.   Nutritional Diagnosis:  NI-5.8.2 Excessive carbohydrate intake As related to previous intake of large amounts of juice and sweet tea, large portions of rice and other starches.  As evidenced by pt statements of same, HGA1c 6.5.    Intervention:  Nutrition counseling provided.  Discussed diabetes disease process and treatment options.  Discussed physiology of diabetes and role of obesity on insulin resistance.  Encouraged moderate weight reduction to improve glucose levels.  Discussed role of medications and diet in glucose control  Provided education on macronutrients on glucose levels.  Provided education on carb counting, importance of regularly scheduled meals/snacks, and meal planning  Discussed effects of physical activity on glucose levels and long-term glucose control.  Recommended 150 minutes of physical activity/week.  Reviewed patient medications.  Discussed role of medication on blood glucose and possible side effects  Discussed blood glucose monitoring and interpretation.  Discussed recommended target ranges and individual ranges.    Described short-term complications: hyper- and hypo-glycemia.  Discussed causes,symptoms, and treatment options.  Discussed prevention, detection, and treatment of long-term complications.  Discussed the role of prolonged elevated glucose levels on body systems.  Discussed role of stress on blood glucose levels and discussed strategies to manage psychosocial issues.  Discussed recommendations for  long-term diabetes self-care.  Established checklist for medical, dental, and emotional self-care.  Teaching Method Utilized:  Visual Auditory  Handouts given during visit include:  Living Well with Diabetes  The Plate Method  Barriers to learning/adherence to  lifestyle change: minimal  Diabetes self-care support plan:   [facility] support group  Husband was here today and is very supportive of pt care plan  Key areas of emphasis included what are carb rich foods, what is an appropriate amount of carbs in a meal and a day, how to track using the Plate Method or the carb counting method.   Demonstrated degree of understanding via:  Teach Back   Monitoring/Evaluation:  Dietary intake, exercise, portion control, and body weight in 3 month(s).

## 2014-04-14 ENCOUNTER — Encounter (HOSPITAL_COMMUNITY)
Admission: RE | Admit: 2014-04-14 | Discharge: 2014-04-14 | Disposition: A | Discharge status: Home / Self Care | Payer: BC Managed Care – PPO | Source: Ambulatory Visit | Attending: Obstetrics and Gynecology | Admitting: Obstetrics and Gynecology

## 2014-04-14 ENCOUNTER — Other Ambulatory Visit: Payer: Self-pay

## 2014-04-14 ENCOUNTER — Encounter (HOSPITAL_COMMUNITY): Payer: Self-pay

## 2014-04-14 DIAGNOSIS — Z01812 Encounter for preprocedural laboratory examination: Secondary | ICD-10-CM | POA: Insufficient documentation

## 2014-04-14 DIAGNOSIS — Z01818 Encounter for other preprocedural examination: Secondary | ICD-10-CM | POA: Insufficient documentation

## 2014-04-14 HISTORY — DX: Personal history of other medical treatment: Z92.89

## 2014-04-14 HISTORY — DX: Hypothyroidism, unspecified: E03.9

## 2014-04-14 HISTORY — DX: Headache: R51

## 2014-04-14 HISTORY — DX: Malignant (primary) neoplasm, unspecified: C80.1

## 2014-04-14 HISTORY — DX: Anemia, unspecified: D64.9

## 2014-04-14 HISTORY — DX: Unspecified asthma, uncomplicated: J45.909

## 2014-04-14 LAB — BASIC METABOLIC PANEL
BUN: 14 mg/dL (ref 6–23)
CALCIUM: 9.2 mg/dL (ref 8.4–10.5)
CO2: 25 mEq/L (ref 19–32)
CREATININE: 0.94 mg/dL (ref 0.50–1.10)
Chloride: 104 mEq/L (ref 96–112)
GFR, EST AFRICAN AMERICAN: 83 mL/min — AB (ref >90)
GFR, EST NON AFRICAN AMERICAN: 72 mL/min — AB (ref >90)
Glucose, Bld: 101 mg/dL — ABNORMAL HIGH (ref 70–99)
Potassium: 3.8 mEq/L (ref 3.7–5.3)
Sodium: 141 mEq/L (ref 137–147)

## 2014-04-14 LAB — CBC
HEMATOCRIT: 29.5 % — AB (ref 36.0–46.0)
Hemoglobin: 9.6 g/dL — ABNORMAL LOW (ref 12.0–15.0)
MCH: 22.7 pg — AB (ref 26.0–34.0)
MCHC: 32.5 g/dL (ref 30.0–36.0)
MCV: 69.9 fL — AB (ref 78.0–100.0)
PLATELETS: 392 10*3/uL (ref 150–400)
RBC: 4.22 MIL/uL (ref 3.87–5.11)
RDW: 17.2 % — AB (ref 11.5–15.5)
WBC: 8 10*3/uL (ref 4.0–10.5)

## 2014-04-14 NOTE — Pre-Procedure Instructions (Signed)
SDS BB History Log given to lab for patient's history of blood transfusion. 

## 2014-04-19 DIAGNOSIS — D219 Benign neoplasm of connective and other soft tissue, unspecified: Secondary | ICD-10-CM

## 2014-04-19 DIAGNOSIS — E1122 Type 2 diabetes mellitus with diabetic chronic kidney disease: Secondary | ICD-10-CM | POA: Diagnosis present

## 2014-04-19 MED ORDER — CEFOTETAN DISODIUM 2 G IJ SOLR
2.0000 g | INTRAMUSCULAR | Status: AC
  Administered 2014-04-20: 2 g via INTRAVENOUS
  Filled 2014-04-19: qty 2

## 2014-04-19 NOTE — H&P (Signed)
Dana Vasquez is an 48 y.o. female who presents for definitive therapy of menorrhagia, fibroids, dysmenorrhea.  The patient has a long hx of very heavy cycles without intermenstrual bleeding.  She also has a hx of fibroids. Her heavy bleeding did not respond to Mirena IUD and after considering all her options for management of symptomatic fibroids, she has decided on hysterectomy.  Pertinent Gynecological History: Menses: flow is excessive with use of 12 pads or tampons on heaviest days, usually lasting 7 to 8 days and with severe dysmenorrhea Bleeding: regular menses but very heavy and painful Contraception: tubal ligation Blood transfusions: 2000 in [place] for ruptured ectopic Sexually transmitted diseases: no past history Previous GYN Procedures: see below  Last mammogram: normal Date: 2015 Last pap: ASCUS neg HR HPV Date: 02/2014 OB History: G3, P2   Menstrual History:  LMP=04/06/14   Past Medical History  Diagnosis Date  . Fibromyalgia   . Euthyroid   . Fatigue   . GERD (gastroesophageal reflux disease)   . Bruises easily   . Knee pain   . Allergy   . Thyroid disease     thyroidectomy 1995  . SVD (spontaneous vaginal delivery)     x 3  . Hypertension   . Cancer     1995  . Asthma     allergy related - rarely uses inhaler  . Sleep apnea     uses CPAP  . Hypothyroidism   . Diabetes mellitus without complication     borderline - no meds  . Headache(784.0)     otc med prn - last one 02/2014  . Arthritis     knees - otc med prn  . Depression     no meds  . Anemia   . History of blood transfusion 2000    In [place], [place] at [place] - ? 4 units transfused  . Anxiety     no meds    Past Surgical History  Procedure Laterality Date  . Tubal ligation    . Fallopian tubes removed      left tube removed per patient - laparotomy  . Thyroidectomy    . Breath tek h pylori  09/18/2011    Procedure: BREATH TEK H PYLORI;  Surgeon: Pedro Earls, MD;  Location: [facility];  Service: General;  Laterality: N/A;  . Tubal ligation    . Colonoscopy    . Wisdom tooth extraction    . Dilation and curettage of uterus  1988    endometriosis    Family History  Problem Relation Age of Onset  . Hypertension Mother   . Cancer Father   . Cancer Brother   . Early death Brother   . Stroke Maternal Uncle   . Heart disease Maternal Uncle     Social History:  reports that she has never smoked. She has never used smokeless tobacco. She reports that she drinks alcohol. She reports that she does not use illicit drugs.  Allergies:  Allergies  Allergen Reactions  . Nitrofurantoin Monohyd Macro Other (See Comments)    Pt stated she gets a bad yeast infection    Prescriptions prior to admission  Medication Sig Dispense Refill  . albuterol (PROVENTIL HFA;VENTOLIN HFA) 108 (90 BASE) MCG/ACT inhaler Inhale into the lungs every 6 (six) hours as needed for wheezing or shortness of breath.      . Benzocaine (BOIL EASE MAXIMUM STRENGTH EX) Apply 1 application topically as needed.      . Ferrous  Sulfate (IRON CR PO) Take by mouth.        . levothyroxine (SYNTHROID, LEVOTHROID) 75 MCG tablet Take 75 mcg by mouth daily before breakfast.      . liothyronine (CYTOMEL) 5 MCG tablet Take 5 mcg by mouth daily.      Marland Kitchen losartan (COZAAR) 25 MG tablet Take 25 mg by mouth daily.        Review of Systems  Constitutional: Negative.   HENT: Negative.   Eyes: Negative.   Respiratory: Negative.   Cardiovascular: Negative.   Gastrointestinal: Negative.   Genitourinary: Negative.   Musculoskeletal: Positive for myalgias.  Skin: Negative.   Neurological: Negative.   Endo/Heme/Allergies: Negative.   Psychiatric/Behavioral: The patient is nervous/anxious.     Blood pressure 153/84, pulse 70, temperature 98.1 F (36.7 C), temperature source Oral, resp. rate 18, SpO2 98.00%. Physical Exam  Constitutional: She is oriented to person, place, and time.  obese  HENT:  Head:  Normocephalic and atraumatic.  Eyes: Conjunctivae and EOM are normal.  Neck: Normal range of motion. Neck supple.  Cardiovascular: Normal rate, regular rhythm and normal heart sounds.   Respiratory: Effort normal and breath sounds normal.  GI: Soft. Bowel sounds are normal.  Musculoskeletal: Normal range of motion.  Neurological: She is alert and oriented to person, place, and time.  Skin: Skin is warm and dry.  Psychiatric:  Anxious    Results for orders placed during the hospital encounter of 04/20/14 (from the past 24 hour(s))  PREGNANCY, URINE     Status: None   Collection Time    04/20/14 10:17 AM      Result Value Ref Range   Preg Test, Ur NEGATIVE  NEGATIVE  GLUCOSE, CAPILLARY     Status: None   Collection Time    04/20/14 10:22 AM      Result Value Ref Range   Glucose-Capillary 97  70 - 99 mg/dL   U/S Multiple fibroids.  No adnexal masses  ENDOMETRIAL BX Benign  ASSESSMENT 1)Uterine fibroids 2) Menorrhagia 3) History of anemia, probably secondary to 2) 4)Family hx of endometrial cancer 5)Morbid Obesity  PLAN 1) Pt made the decision over a year ago that she wanted to proceed with hysterectomy 2) Reviewed indications for her procedure which include fibroids associated with menorrhagia, dysmenorrhea unresponsive to Mirena IUD, and anemia with hx of ectopic pregnancy and family hx of endometrial cancer. Pt declines any procedure which could involve morcellation because of her family hx of endometrial cancer. She thus requests TAH.  I have advised removal of the patient's remaining to the and she agrees to salpingectomy Risks of surgery reviewed as anesthesia, infection, bleeding and damage to adjacent organs. Reviewed that pt's body habitus and diabetes put her at risk for wound infection as well. Because of her significant anemia. The patient will have blood crossmatched.   Dana Vasquez 04/20/2014, 11:20 AM

## 2014-04-20 ENCOUNTER — Encounter (HOSPITAL_COMMUNITY)
Admission: RE | Disposition: A | Discharge status: Home / Self Care | Payer: Self-pay | Source: Ambulatory Visit | Attending: Obstetrics and Gynecology

## 2014-04-20 ENCOUNTER — Encounter (HOSPITAL_COMMUNITY): Payer: Self-pay | Admitting: Anesthesiology

## 2014-04-20 ENCOUNTER — Inpatient Hospital Stay (HOSPITAL_COMMUNITY): Payer: BC Managed Care – PPO | Admitting: Anesthesiology

## 2014-04-20 ENCOUNTER — Inpatient Hospital Stay (HOSPITAL_COMMUNITY)
Admission: RE | Admit: 2014-04-20 | Discharge: 2014-04-22 | DRG: 742 | Disposition: A | Discharge status: Home / Self Care | Payer: BC Managed Care – PPO | Source: Ambulatory Visit | Attending: Obstetrics and Gynecology | Admitting: Obstetrics and Gynecology

## 2014-04-20 ENCOUNTER — Encounter (HOSPITAL_COMMUNITY): Payer: BC Managed Care – PPO | Admitting: Anesthesiology

## 2014-04-20 DIAGNOSIS — Z8249 Family history of ischemic heart disease and other diseases of the circulatory system: Secondary | ICD-10-CM

## 2014-04-20 DIAGNOSIS — E119 Type 2 diabetes mellitus without complications: Secondary | ICD-10-CM | POA: Diagnosis present

## 2014-04-20 DIAGNOSIS — E66813 Obesity, class 3: Secondary | ICD-10-CM | POA: Diagnosis present

## 2014-04-20 DIAGNOSIS — Z8542 Personal history of malignant neoplasm of other parts of uterus: Secondary | ICD-10-CM

## 2014-04-20 DIAGNOSIS — D251 Intramural leiomyoma of uterus: Principal | ICD-10-CM | POA: Diagnosis present

## 2014-04-20 DIAGNOSIS — F3289 Other specified depressive episodes: Secondary | ICD-10-CM | POA: Diagnosis present

## 2014-04-20 DIAGNOSIS — E1122 Type 2 diabetes mellitus with diabetic chronic kidney disease: Secondary | ICD-10-CM | POA: Diagnosis present

## 2014-04-20 DIAGNOSIS — Z823 Family history of stroke: Secondary | ICD-10-CM

## 2014-04-20 DIAGNOSIS — IMO0001 Reserved for inherently not codable concepts without codable children: Secondary | ICD-10-CM | POA: Diagnosis present

## 2014-04-20 DIAGNOSIS — D259 Leiomyoma of uterus, unspecified: Secondary | ICD-10-CM | POA: Diagnosis present

## 2014-04-20 DIAGNOSIS — Z6841 Body Mass Index (BMI) 40.0 and over, adult: Secondary | ICD-10-CM

## 2014-04-20 DIAGNOSIS — N920 Excessive and frequent menstruation with regular cycle: Secondary | ICD-10-CM | POA: Diagnosis present

## 2014-04-20 DIAGNOSIS — D649 Anemia, unspecified: Secondary | ICD-10-CM | POA: Diagnosis present

## 2014-04-20 DIAGNOSIS — K219 Gastro-esophageal reflux disease without esophagitis: Secondary | ICD-10-CM | POA: Diagnosis present

## 2014-04-20 DIAGNOSIS — G4730 Sleep apnea, unspecified: Secondary | ICD-10-CM | POA: Diagnosis present

## 2014-04-20 DIAGNOSIS — N946 Dysmenorrhea, unspecified: Secondary | ICD-10-CM | POA: Diagnosis present

## 2014-04-20 DIAGNOSIS — D219 Benign neoplasm of connective and other soft tissue, unspecified: Secondary | ICD-10-CM

## 2014-04-20 DIAGNOSIS — E039 Hypothyroidism, unspecified: Secondary | ICD-10-CM | POA: Diagnosis present

## 2014-04-20 DIAGNOSIS — F329 Major depressive disorder, single episode, unspecified: Secondary | ICD-10-CM | POA: Diagnosis present

## 2014-04-20 HISTORY — PX: ABDOMINAL HYSTERECTOMY: SHX81

## 2014-04-20 HISTORY — PX: LYSIS OF ADHESION: SHX5961

## 2014-04-20 LAB — PREPARE RBC (CROSSMATCH)

## 2014-04-20 LAB — GLUCOSE, CAPILLARY
GLUCOSE-CAPILLARY: 124 mg/dL — AB (ref 70–99)
Glucose-Capillary: 97 mg/dL (ref 70–99)

## 2014-04-20 LAB — ABO/RH: ABO/RH(D): O POS

## 2014-04-20 LAB — PREGNANCY, URINE: Preg Test, Ur: NEGATIVE

## 2014-04-20 SURGERY — HYSTERECTOMY, ABDOMINAL
Anesthesia: General | Site: Abdomen

## 2014-04-20 MED ORDER — KETOROLAC TROMETHAMINE 30 MG/ML IJ SOLN
30.0000 mg | Freq: Four times a day (QID) | INTRAMUSCULAR | Status: AC
  Administered 2014-04-20 – 2014-04-21 (×2): 30 mg via INTRAVENOUS
  Filled 2014-04-20 (×2): qty 1

## 2014-04-20 MED ORDER — DEXAMETHASONE SODIUM PHOSPHATE 10 MG/ML IJ SOLN
INTRAMUSCULAR | Status: DC | PRN
  Administered 2014-04-20: 10 mg via INTRAVENOUS

## 2014-04-20 MED ORDER — PROPOFOL 10 MG/ML IV EMUL
INTRAVENOUS | Status: AC
  Filled 2014-04-20: qty 20

## 2014-04-20 MED ORDER — LACTATED RINGERS IV SOLN: INTRAVENOUS | Status: DC

## 2014-04-20 MED ORDER — DIPHENHYDRAMINE HCL 12.5 MG/5ML PO ELIX: 12.5000 mg | ORAL_SOLUTION | Freq: Four times a day (QID) | ORAL | Status: DC | PRN

## 2014-04-20 MED ORDER — LEVOTHYROXINE SODIUM 75 MCG PO TABS
75.0000 ug | ORAL_TABLET | Freq: Every day | ORAL | Status: DC
  Administered 2014-04-21 – 2014-04-22 (×2): 75 ug via ORAL
  Filled 2014-04-20 (×3): qty 1

## 2014-04-20 MED ORDER — ONDANSETRON HCL 4 MG/2ML IJ SOLN
INTRAMUSCULAR | Status: DC | PRN
  Administered 2014-04-20: 4 mg via INTRAVENOUS

## 2014-04-20 MED ORDER — DEXAMETHASONE SODIUM PHOSPHATE 10 MG/ML IJ SOLN
INTRAMUSCULAR | Status: AC
  Filled 2014-04-20: qty 1

## 2014-04-20 MED ORDER — HYDROMORPHONE 0.3 MG/ML IV SOLN: INTRAVENOUS | Status: DC

## 2014-04-20 MED ORDER — SODIUM CHLORIDE 0.9 % IJ SOLN: 9.0000 mL | INTRAMUSCULAR | Status: DC | PRN

## 2014-04-20 MED ORDER — FERROUS SULFATE 325 (65 FE) MG PO TABS
325.0000 mg | ORAL_TABLET | Freq: Two times a day (BID) | ORAL | Status: DC
  Administered 2014-04-21 – 2014-04-22 (×3): 325 mg via ORAL
  Filled 2014-04-20 (×3): qty 1

## 2014-04-20 MED ORDER — NALOXONE HCL 0.4 MG/ML IJ SOLN: 0.4000 mg | INTRAMUSCULAR | Status: DC | PRN

## 2014-04-20 MED ORDER — ONDANSETRON HCL 4 MG/2ML IJ SOLN: 4.0000 mg | Freq: Four times a day (QID) | INTRAMUSCULAR | Status: DC | PRN

## 2014-04-20 MED ORDER — DIPHENHYDRAMINE HCL 50 MG/ML IJ SOLN: 12.5000 mg | Freq: Four times a day (QID) | INTRAMUSCULAR | Status: DC | PRN

## 2014-04-20 MED ORDER — HYDROMORPHONE HCL PF 1 MG/ML IJ SOLN
INTRAMUSCULAR | Status: AC
  Filled 2014-04-20: qty 1

## 2014-04-20 MED ORDER — LIOTHYRONINE SODIUM 5 MCG PO TABS
5.0000 ug | ORAL_TABLET | Freq: Every day | ORAL | Status: DC
  Administered 2014-04-21 – 2014-04-22 (×2): 5 ug via ORAL
  Filled 2014-04-20: qty 1

## 2014-04-20 MED ORDER — KETOROLAC TROMETHAMINE 30 MG/ML IJ SOLN: 15.0000 mg | Freq: Once | INTRAMUSCULAR | Status: DC | PRN

## 2014-04-20 MED ORDER — LOSARTAN POTASSIUM 25 MG PO TABS
25.0000 mg | ORAL_TABLET | Freq: Every day | ORAL | Status: DC
Start: 2014-04-20 — End: 2014-04-21
  Administered 2014-04-20: 25 mg via ORAL
  Filled 2014-04-20 (×3): qty 1

## 2014-04-20 MED ORDER — HYDROMORPHONE 0.3 MG/ML IV SOLN
INTRAVENOUS | Status: DC
  Administered 2014-04-20: 18:00:00 via INTRAVENOUS
  Administered 2014-04-20: 2.7 mg via INTRAVENOUS
  Administered 2014-04-21 (×2): 0.9 mg via INTRAVENOUS
  Administered 2014-04-21: 0.6 mg via INTRAVENOUS
  Filled 2014-04-20: qty 25

## 2014-04-20 MED ORDER — KETOROLAC TROMETHAMINE 30 MG/ML IJ SOLN
INTRAMUSCULAR | Status: DC | PRN
  Administered 2014-04-20: 30 mg via INTRAVENOUS

## 2014-04-20 MED ORDER — FENTANYL CITRATE 0.05 MG/ML IJ SOLN
INTRAMUSCULAR | Status: AC
  Filled 2014-04-20: qty 5

## 2014-04-20 MED ORDER — PROPOFOL 10 MG/ML IV BOLUS
INTRAVENOUS | Status: DC | PRN
  Administered 2014-04-20: 200 mg via INTRAVENOUS
  Administered 2014-04-20: 40 mg via INTRAVENOUS

## 2014-04-20 MED ORDER — BUPIVACAINE HCL (PF) 0.25 % IJ SOLN
INTRAMUSCULAR | Status: AC
  Filled 2014-04-20: qty 30

## 2014-04-20 MED ORDER — MIDAZOLAM HCL 2 MG/2ML IJ SOLN
INTRAMUSCULAR | Status: DC | PRN
  Administered 2014-04-20: 2 mg via INTRAVENOUS

## 2014-04-20 MED ORDER — MENTHOL 3 MG MT LOZG
1.0000 | LOZENGE | OROMUCOSAL | Status: DC | PRN
  Administered 2014-04-21: 3 mg via ORAL
  Filled 2014-04-20: qty 9

## 2014-04-20 MED ORDER — GLYCOPYRROLATE 0.2 MG/ML IJ SOLN
INTRAMUSCULAR | Status: DC | PRN
  Administered 2014-04-20 (×2): 0.1 mg via INTRAVENOUS
  Administered 2014-04-20: 0.6 mg via INTRAVENOUS

## 2014-04-20 MED ORDER — LIDOCAINE HCL (CARDIAC) 20 MG/ML IV SOLN
INTRAVENOUS | Status: AC
  Filled 2014-04-20: qty 5

## 2014-04-20 MED ORDER — NEOSTIGMINE METHYLSULFATE 10 MG/10ML IV SOLN
INTRAVENOUS | Status: AC
  Filled 2014-04-20: qty 1

## 2014-04-20 MED ORDER — DOCUSATE SODIUM 100 MG PO CAPS
100.0000 mg | ORAL_CAPSULE | Freq: Two times a day (BID) | ORAL | Status: DC
  Administered 2014-04-21 – 2014-04-22 (×3): 100 mg via ORAL
  Filled 2014-04-20 (×3): qty 1

## 2014-04-20 MED ORDER — MIDAZOLAM HCL 2 MG/2ML IJ SOLN
INTRAMUSCULAR | Status: AC
  Filled 2014-04-20: qty 2

## 2014-04-20 MED ORDER — ALBUTEROL SULFATE (2.5 MG/3ML) 0.083% IN NEBU: 2.5000 mg | INHALATION_SOLUTION | Freq: Four times a day (QID) | RESPIRATORY_TRACT | Status: DC | PRN

## 2014-04-20 MED ORDER — ONDANSETRON HCL 4 MG PO TABS: 4.0000 mg | ORAL_TABLET | Freq: Three times a day (TID) | ORAL | Status: DC | PRN

## 2014-04-20 MED ORDER — OXYCODONE-ACETAMINOPHEN 5-325 MG PO TABS
1.0000 | ORAL_TABLET | ORAL | Status: DC | PRN
  Administered 2014-04-21: 2 via ORAL
  Administered 2014-04-21: 1 via ORAL
  Administered 2014-04-21 – 2014-04-22 (×3): 2 via ORAL
  Filled 2014-04-20 (×5): qty 2

## 2014-04-20 MED ORDER — ONDANSETRON HCL 4 MG/2ML IJ SOLN
INTRAMUSCULAR | Status: AC
  Filled 2014-04-20: qty 2

## 2014-04-20 MED ORDER — HYDROMORPHONE HCL PF 1 MG/ML IJ SOLN
INTRAMUSCULAR | Status: DC | PRN
  Administered 2014-04-20: 1 mg via INTRAVENOUS

## 2014-04-20 MED ORDER — NEOSTIGMINE METHYLSULFATE 10 MG/10ML IV SOLN
INTRAVENOUS | Status: DC | PRN
  Administered 2014-04-20: 5 mg via INTRAVENOUS

## 2014-04-20 MED ORDER — ROCURONIUM BROMIDE 100 MG/10ML IV SOLN
INTRAVENOUS | Status: AC
  Filled 2014-04-20: qty 1

## 2014-04-20 MED ORDER — ROCURONIUM BROMIDE 100 MG/10ML IV SOLN
INTRAVENOUS | Status: DC | PRN
  Administered 2014-04-20: 10 mg via INTRAVENOUS
  Administered 2014-04-20: 60 mg via INTRAVENOUS
  Administered 2014-04-20: 20 mg via INTRAVENOUS

## 2014-04-20 MED ORDER — ALBUTEROL SULFATE HFA 108 (90 BASE) MCG/ACT IN AERS
INHALATION_SPRAY | RESPIRATORY_TRACT | Status: AC
  Administered 2014-04-20: 2
  Filled 2014-04-20: qty 6.7

## 2014-04-20 MED ORDER — HYDROMORPHONE HCL PF 1 MG/ML IJ SOLN
0.2500 mg | INTRAMUSCULAR | Status: DC | PRN
  Administered 2014-04-20 (×2): 0.5 mg via INTRAVENOUS

## 2014-04-20 MED ORDER — MEPERIDINE HCL 25 MG/ML IJ SOLN
6.2500 mg | INTRAMUSCULAR | Status: DC | PRN
Start: 2014-04-20 — End: 2014-04-20

## 2014-04-20 MED ORDER — MIDAZOLAM HCL 2 MG/2ML IJ SOLN: 0.5000 mg | Freq: Once | INTRAMUSCULAR | Status: DC | PRN

## 2014-04-20 MED ORDER — BUPIVACAINE HCL (PF) 0.25 % IJ SOLN
INTRAMUSCULAR | Status: DC | PRN
  Administered 2014-04-20: 30 mL

## 2014-04-20 MED ORDER — LACTATED RINGERS IV SOLN
INTRAVENOUS | Status: DC
  Administered 2014-04-20 (×5): via INTRAVENOUS

## 2014-04-20 MED ORDER — LIDOCAINE HCL (CARDIAC) 20 MG/ML IV SOLN
INTRAVENOUS | Status: DC | PRN
  Administered 2014-04-20: 100 mg via INTRAVENOUS

## 2014-04-20 MED ORDER — IBUPROFEN 600 MG PO TABS
600.0000 mg | ORAL_TABLET | Freq: Four times a day (QID) | ORAL | Status: DC | PRN
  Administered 2014-04-21 – 2014-04-22 (×3): 600 mg via ORAL
  Filled 2014-04-20 (×3): qty 1

## 2014-04-20 MED ORDER — ALBUTEROL SULFATE (2.5 MG/3ML) 0.083% IN NEBU
2.5000 mg | INHALATION_SOLUTION | Freq: Once | RESPIRATORY_TRACT | Status: DC
  Filled 2014-04-20: qty 3

## 2014-04-20 MED ORDER — PROMETHAZINE HCL 25 MG/ML IJ SOLN: 6.2500 mg | INTRAMUSCULAR | Status: DC | PRN

## 2014-04-20 MED ORDER — FENTANYL CITRATE 0.05 MG/ML IJ SOLN
INTRAMUSCULAR | Status: DC | PRN
  Administered 2014-04-20 (×3): 50 ug via INTRAVENOUS
  Administered 2014-04-20: 100 ug via INTRAVENOUS
  Administered 2014-04-20 (×3): 50 ug via INTRAVENOUS
  Administered 2014-04-20: 100 ug via INTRAVENOUS

## 2014-04-20 SURGICAL SUPPLY — 53 items
ADH SKN CLS APL DERMABOND .7 (GAUZE/BANDAGES/DRESSINGS)
CANISTER SUCT 3000ML (MISCELLANEOUS) ×2 IMPLANT
CATH ROBINSON RED A/P 16FR (CATHETERS) IMPLANT
CLOTH BEACON ORANGE TIMEOUT ST (SAFETY) ×2 IMPLANT
CONT PATH 16OZ SNAP LID 3702 (MISCELLANEOUS) ×2 IMPLANT
CONT SPECI 4OZ STER CLIK (MISCELLANEOUS) IMPLANT
DECANTER SPIKE VIAL GLASS SM (MISCELLANEOUS) IMPLANT
DERMABOND ADVANCED (GAUZE/BANDAGES/DRESSINGS)
DERMABOND ADVANCED .7 DNX12 (GAUZE/BANDAGES/DRESSINGS) IMPLANT
DRAIN JACKSON PRT FLT 7MM (DRAIN) IMPLANT
DRAPE WARM FLUID 44X44 (DRAPE) IMPLANT
DRSG OPSITE POSTOP 4X10 (GAUZE/BANDAGES/DRESSINGS) ×2 IMPLANT
DURAPREP 26ML APPLICATOR (WOUND CARE) ×2 IMPLANT
ELECT BLADE 6.5 EXT (BLADE) ×1 IMPLANT
ELECT CAUTERY BLADE 6.4 (BLADE) IMPLANT
ELECT NDL TIP 2.8 STRL (NEEDLE) IMPLANT
ELECT NEEDLE TIP 2.8 STRL (NEEDLE) IMPLANT
EVACUATOR SILICONE 100CC (DRAIN) IMPLANT
GAUZE SPONGE 4X4 16PLY XRAY LF (GAUZE/BANDAGES/DRESSINGS) ×2 IMPLANT
GLOVE BIOGEL PI IND STRL 7.0 (GLOVE) ×2 IMPLANT
GLOVE BIOGEL PI INDICATOR 7.0 (GLOVE) ×2
GLOVE SURG SS PI 6.5 STRL IVOR (GLOVE) ×4 IMPLANT
GOWN STRL REUS W/TWL LRG LVL3 (GOWN DISPOSABLE) ×6 IMPLANT
NDL HYPO 25X1 1.5 SAFETY (NEEDLE) IMPLANT
NDL SPNL 22GX3.5 QUINCKE BK (NEEDLE) ×1 IMPLANT
NEEDLE HYPO 25X1 1.5 SAFETY (NEEDLE) IMPLANT
NEEDLE SPNL 22GX3.5 QUINCKE BK (NEEDLE) ×2 IMPLANT
NS IRRIG 1000ML POUR BTL (IV SOLUTION) ×2 IMPLANT
PACK ABDOMINAL GYN (CUSTOM PROCEDURE TRAY) ×2 IMPLANT
PAD OB MATERNITY 4.3X12.25 (PERSONAL CARE ITEMS) ×2 IMPLANT
PROTECTOR NERVE ULNAR (MISCELLANEOUS) ×2 IMPLANT
SPONGE LAP 18X18 X RAY DECT (DISPOSABLE) ×4 IMPLANT
STAPLER VISISTAT 35W (STAPLE) IMPLANT
SUT MNCRL AB 3-0 PS2 27 (SUTURE) IMPLANT
SUT PDS AB 1 CT  36 (SUTURE)
SUT PDS AB 1 CT 36 (SUTURE) IMPLANT
SUT PLAIN 2 0 XLH (SUTURE) IMPLANT
SUT SILK 0 FSL (SUTURE) IMPLANT
SUT VIC AB 0 CT1 18XCR BRD8 (SUTURE) ×3 IMPLANT
SUT VIC AB 0 CT1 27 (SUTURE) ×6
SUT VIC AB 0 CT1 27XBRD ANBCTR (SUTURE) IMPLANT
SUT VIC AB 0 CT1 27XCR 8 STRN (SUTURE) IMPLANT
SUT VIC AB 0 CT1 8-18 (SUTURE) ×6
SUT VIC AB 2-0 CT1 (SUTURE) ×2 IMPLANT
SUT VIC AB 3-0 SH 27 (SUTURE)
SUT VIC AB 3-0 SH 27X BRD (SUTURE) IMPLANT
SUT VICRYL 0 TIES 12 18 (SUTURE) ×2 IMPLANT
SYR 50ML LL SCALE MARK (SYRINGE) IMPLANT
SYR CONTROL 10ML LL (SYRINGE) ×1 IMPLANT
SYR TB 1ML 25GX5/8 (SYRINGE) IMPLANT
TOWEL OR 17X24 6PK STRL BLUE (TOWEL DISPOSABLE) ×4 IMPLANT
TRAY FOLEY CATH 14FR (SET/KITS/TRAYS/PACK) ×2 IMPLANT
WATER STERILE IRR 1000ML POUR (IV SOLUTION) ×2 IMPLANT

## 2014-04-20 NOTE — Anesthesia Postprocedure Evaluation (Signed)
  Anesthesia Post-op Note  Anesthesia Post Note  Patient: Dana Vasquez  Procedure(s) Performed: Procedure(s) (LRB): Total ABDOMINAL HYSTERECTOMY partial right salpingectomy (N/A) LYSIS OF ADHESION (N/A)  Anesthesia type: General  Patient location: PACU  Post pain: Pain level controlled  Post assessment: Post-op Vital signs reviewed  Last Vitals:  Filed Vitals:   04/20/14 1557  BP:   Pulse: 58  Temp:   Resp: 13    Post vital signs: Reviewed  Level of consciousness: sedated  Complications: No apparent anesthesia complications

## 2014-04-20 NOTE — Anesthesia Preprocedure Evaluation (Addendum)
Anesthesia Evaluation  Patient identified by MRN, date of birth, ID band Patient awake    Reviewed: Allergy & Precautions, H&P , Patient's Chart, lab work & pertinent test results, reviewed documented beta blocker date and time   History of Anesthesia Complications Negative for: history of anesthetic complications  Airway Mallampati: III TM Distance: >3 FB Neck ROM: full    Dental   Pulmonary asthma , sleep apnea ,  breath sounds clear to auscultation        Cardiovascular Exercise Tolerance: Good hypertension, Rhythm:regular Rate:Normal     Neuro/Psych  Headaches, PSYCHIATRIC DISORDERS Anxiety Depression  Neuromuscular disease    GI/Hepatic GERD-  Controlled,  Endo/Other  diabetesHypothyroidism Morbid obesity  Renal/GU      Musculoskeletal  (+) Fibromyalgia -  Abdominal   Peds  Hematology  (+) anemia ,   Anesthesia Other Findings   Reproductive/Obstetrics                          Anesthesia Physical Anesthesia Plan  ASA: III  Anesthesia Plan: General ETT   Post-op Pain Management:    Induction:   Airway Management Planned:   Additional Equipment:   Intra-op Plan:   Post-operative Plan:   Informed Consent: I have reviewed the patients History and Physical, chart, labs and discussed the procedure including the risks, benefits and alternatives for the proposed anesthesia with the patient or authorized representative who has indicated his/her understanding and acceptance.   Dental Advisory Given  Plan Discussed with: CRNA and Surgeon  Anesthesia Plan Comments:         Anesthesia Quick Evaluation

## 2014-04-20 NOTE — Anesthesia Procedure Notes (Signed)
Procedure Name: Intubation Date/Time: 04/20/2014 11:49 AM Performed by: Flossie Dibble Pre-anesthesia Checklist: Suction available, Timeout performed, Emergency Drugs available, Patient identified and Patient being monitored Patient Re-evaluated:Patient Re-evaluated prior to inductionOxygen Delivery Method: Circle system utilized Preoxygenation: Pre-oxygenation with 100% oxygen (Pre O2  X 8 minutes using mask with straps.) Intubation Type: IV induction Ventilation: Mask ventilation without difficulty Laryngoscope Size: Mac and 3 (Glide Scope Blade) Grade View: Grade I Tube type: Oral Tube size: 7.0 mm Number of attempts: 1 Airway Equipment and Method: Patient positioned with wedge pillow,  Video-laryngoscopy and Stylet (Postioned on 6 folded bath blankets + foam head cradle under shloulders & head for sniffing position.) Placement Confirmation: ETT inserted through vocal cords under direct vision,  positive ETCO2 and breath sounds checked- equal and bilateral Secured at: 21 cm Tube secured with: Tape Dental Injury: Teeth and Oropharynx as per pre-operative assessment  Difficulty Due To: Difficulty was unanticipated

## 2014-04-20 NOTE — Op Note (Signed)
04/20/2014  3:47 PM  PATIENT:  Dana Vasquez  48 y.o. female MRN:  [id number]  PRE-OPERATIVE DIAGNOSIS:  Fibroids  POST-OPERATIVE DIAGNOSIS:  Fibroids.  Pelvic adhesions. S/p left salpingectomy and partial right salpingectomy  PROCEDURE:  Procedure(s): Total ABDOMINAL HYSTERECTOMY partial right salpingectomy LYSIS OF ADHESION  SURGEON:  Surgeon(s): Eldred Manges, MD  ASSISTANTS: Earnstine Regal certified physician Asst.   ANESTHESIA:  General  ESTIMATED BLOOD LOSS: 527PO  COMPLICATIONS: None  BLOOD ADMINISTERED:none  DRAINS: none   LOCAL MEDICATIONS USED:  MARCAINE    and Amount: 30 ml  SPECIMEN:  Source of Specimen:  Uterine fundus, uterine cervix, portion of right fallopian tube  DISPOSITION OF SPECIMEN:  PATHOLOGY  COUNTS:  YES  FINDINGS: The uterus was enlarged to approximately 8 weeks' size with several subserosal myomata. The left fallopian tube was status post salpingectomy. The right fallopian tube was status post partial salpingectomy with the fimbriated and remaining connected to the right ovary. There were extensive pelvic adhesions between the uterus and bowel, the uterus and pelvic side walls, the ovary, and bowel, and the bowel and pelvic sidewall. The ovaries bilaterally were normal size with the right ovary containing a hemorrhagic cyst.  PROCEDURE: The patient was taken to the operating room after appropriate identification and placed on the operating table in the supine position. Equipment for the induction of general anesthesia was placed and after the attainment of adequate general anesthesia the abdomen, perineum, and vagina were prepped with multiple layers of Betadine.a Foley catheter was inserted into the bladder under sterile conditions and connected to straight drainage. The abdomen was draped as a sterile field.  Suprapubic injection of 10 cc of quarter percent Marcaine was undertaken. A suprapubic incision was made and the abdomen opened in  layers. Peritoneum was entered and adhesions between the anterior abdominal wall and the omentum lysed to allow placement of a self-retaining retractor  in the abdominal cavity. A bladder blade was placed. After visual and palpable intraperitoneal evaluation of the anatomy was carried out, extensive lysis of adhesions was begun using sharp dissection to restore a semblance of normal anatomy allowing the procedure to begin. Adhesions between the uterus and bowel were lysed as were occasions between the towel and ovary.  Large Kelly clamps were placed at the cornual regions of the uterus. The left round ligament was then identified clamped cut and suture ligated. That incision was taken anteriorly on the anterior leaf of the broad ligament. The utero-ovarian ligament was identified clamped cut, free tied and suture-ligated. A similar procedure was carried out on the opposite side with the round ligament and utero-ovarian ligament. The bladder flap was completed on the opposite side with incision of the anterior leaf of the broad ligament. The bladder was dissected off the anterior cervix with a combination of blunt and sharp dissection. The uterine arteries on the right and left side were skeletonized, clamped cut and suture ligated.  Parametial tissues on the right and left side were clamped, cut and suture-ligated down to the level of the cervix. The cervical tissues were then clamped cut and suture ligated. The uterine fundus was excised from the cervix, and removed from the operative field for improved visualization. Uterosacral ligaments were clamped cut suture-ligated and those sutures held. The vaginal angles were clamped cut suture ligated and the sutures held. The remainder of the vagina was incised and the cervix was removed from the operative field. Vaginal cuff was closed with figure-of-eight sutures of 0 Vicryl. Copious irrigation  was carried out. The sutures holding the vaginal angles and uterosacral  ligaments were then tied together. Hemostasis was noted to be adequate and all instruments were removed from the peritoneal cavity.  The abdominal peritoneum was closed using running suture of 2-0 Vicryl.  The rectus fascia was closed with running sutures of 0 Vicryl from each apex to  the midline and each tied in the midline. The subcutaneous tissue was made hemostatic with Bovie cautery and irrigated. Interrupted sutures of 20 plain were placed in subcutaneous tissue to decrease tension on the incision. The skin incision was closed with a subcuticular suture of 3-0 Monocryl. Sterile dressing was applied. The patient was awakened from general anesthesia and taken to the recovery room in satisfactory condition having tolerated the procedure well with  sponge and instrument counts correct.  PLAN OF CARE: Admit to the postanesthesia care unit and then to women's unit  PATIENT DISPOSITION:  PACU - hemodynamically stable.   Delay start of Pharmacological VTE agent (>24hrs) due to surgical blood loss or risk of bleeding:  SCD hose were used during the entire case   Vasquez,Dana P 3:47 PM

## 2014-04-20 NOTE — Transfer of Care (Signed)
Immediate Anesthesia Transfer of Care Note  Patient: Dana Vasquez  Procedure(s) Performed: Procedure(s): Total ABDOMINAL HYSTERECTOMY partial right salpingectomy (N/A) LYSIS OF ADHESION (N/A)  Patient Location: PACU  Anesthesia Type:General  Level of Consciousness: awake, alert  and oriented  Airway & Oxygen Therapy: Patient Spontanous Breathing and Patient connected to nasal cannula oxygen  Post-op Assessment: Report given to PACU RN and Post -op Vital signs reviewed and stable  Post vital signs: Reviewed and stable  Complications: No apparent anesthesia complications

## 2014-04-21 ENCOUNTER — Encounter (HOSPITAL_COMMUNITY): Payer: Self-pay | Admitting: Obstetrics and Gynecology

## 2014-04-21 LAB — CBC
HCT: 27 % — ABNORMAL LOW (ref 36.0–46.0)
Hemoglobin: 8.6 g/dL — ABNORMAL LOW (ref 12.0–15.0)
MCH: 22.1 pg — AB (ref 26.0–34.0)
MCHC: 31.9 g/dL (ref 30.0–36.0)
MCV: 69.2 fL — AB (ref 78.0–100.0)
PLATELETS: 353 10*3/uL (ref 150–400)
RBC: 3.9 MIL/uL (ref 3.87–5.11)
RDW: 17.2 % — AB (ref 11.5–15.5)
WBC: 17.1 10*3/uL — AB (ref 4.0–10.5)

## 2014-04-21 MED ORDER — LOSARTAN POTASSIUM 25 MG PO TABS
25.0000 mg | ORAL_TABLET | Freq: Every day | ORAL | Status: DC
  Administered 2014-04-21: 25 mg via ORAL
  Filled 2014-04-21 (×2): qty 1

## 2014-04-21 NOTE — Addendum Note (Signed)
Addendum created 04/21/14 6256 by Flossie Dibble, CRNA   Modules edited: Notes Section   Notes Section:  File: [id number]

## 2014-04-21 NOTE — Progress Notes (Signed)
Dana Vasquez is a59 y.o.  [id number]  Post Op Date # 1: TAH/BS  Subjective: Patient is Doing well postoperatively. Patient has Pain is controlled with current analgesics. Medications being used: prescription NSAID's including Toradol and narcotic analgesics including PCA Dilaudid.., Initial ambulation caused dizziness and feeling faint but early morning ambulation did not. Tolerating liquids with no nausea.  Objective: Vital signs in last 24 hours: Temp:  [98 F (36.7 C)-98.6 F (37 C)] 98.2 F (36.8 C) (06/25 1517) Pulse Rate:  [55-78] 60 (06/25 0633) Resp:  [11-21] 12 (06/25 0633) BP: (133-153)/(64-84) 149/76 mmHg (06/25 0633) SpO2:  [94 %-100 %] 100 % (06/25 6160) Weight:  [265 lb (120.203 kg)] 265 lb (120.203 kg) (06/25 0201)  Intake/Output from previous day: 06/24 0701 - 06/25 0700 In: 4590 [P.O.:540; I.V.:4050] Out: 3000 [Urine:2600] Intake/Output this shift:    Recent Labs Lab 04/14/14 0955 04/21/14 0510  WBC 8.0 17.1*  HGB 9.6* 8.6*  HCT 29.5* 27.0*  PLT 392 353     Recent Labs Lab 04/14/14 0956  NA 141  K 3.8  CL 104  CO2 25  BUN 14  CREATININE 0.94  CALCIUM 9.2  GLUCOSE 101*    EXAM: General: cooperative, no distress and groggy. Resp: clear to auscultation bilaterally Cardio: regular rate and rhythm, S1, S2 normal, no murmur, click, rub or gallop GI: Bowel sounds presernt, dressing dry and intact with a few spots of dried stain along incision line. Extremities: Homans sign is negative, no sign of DVT and SCD hose in place and functioning.   Assessment: s/p Procedure(s): Total ABDOMINAL HYSTERECTOMY partial right salpingectomy LYSIS OF ADHESION: stable and anemia  Plan: Advance diet Encourage ambulation Advance to PO medication Routine care Discussed situations in which transfusion would be considered, including, syncope, or inability to tolerate ambulation and the current hemoglobin. Orthostatic vital signs have been stable and will  be monitored.  LOS: 1 day    POWELL,ELMIRA, PA-C 04/21/2014 7:38 AM

## 2014-04-21 NOTE — Discharge Instructions (Signed)
Call [facility] @ [phone] if:  You have a temperature greater than or equal to 100.4 degrees Farenheit orally You have pain that is not made better by the pain medication given and taken as directed You have excessive bleeding or problems urinating  Take Colace (Docusate Sodium/Stool Softener) 100 mg 2-3 times daily while taking narcotic pain medicine to avoid constipation or until bowel movements are regular. Take your iron supplements twice a day for the next 16 weeks Take your Ibuprofen 600 mg with food every 6 hours for 5 days then as needed for pain  You may drive after 2 weeks You may walk up steps  You may shower tomorrow You may resume a regular diet Keep incision clean and dry  Do not lift over 15 pounds for 6 weeks Avoid anything in vagina for 6 weeks (or until after your post-operative visit)  Keep appointment with Dr. Leo Grosser on May 25, 2014 at 10:15 a.m.

## 2014-04-21 NOTE — Anesthesia Postprocedure Evaluation (Signed)
Anesthesia Post Note  Patient: Dana Vasquez  Procedure(s) Performed: Procedure(s): Total ABDOMINAL HYSTERECTOMY partial right salpingectomy (N/A) LYSIS OF ADHESION (N/A)  Anesthesia type: General  Patient location: Women's Unit  Post pain: Pain level controlled  Post assessment: Post-op Vital signs reviewed  Last Vitals: BP 149/76  Pulse 60  Temp(Src) 36.8 C (Oral)  Resp 12  Ht 5\' 4"  (1.626 m)  Wt 265 lb (120.203 kg)  BMI 45.46 kg/m2  SpO2 100%  Post vital signs: Reviewed  Level of consciousness: awake  Complications: No apparent anesthesia complications

## 2014-04-22 LAB — CBC
HCT: 25.6 % — ABNORMAL LOW (ref 36.0–46.0)
Hemoglobin: 8.1 g/dL — ABNORMAL LOW (ref 12.0–15.0)
MCH: 22.1 pg — ABNORMAL LOW (ref 26.0–34.0)
MCHC: 31.6 g/dL (ref 30.0–36.0)
MCV: 69.8 fL — ABNORMAL LOW (ref 78.0–100.0)
Platelets: 327 10*3/uL (ref 150–400)
RBC: 3.67 MIL/uL — ABNORMAL LOW (ref 3.87–5.11)
RDW: 17.2 % — AB (ref 11.5–15.5)
WBC: 11.8 10*3/uL — ABNORMAL HIGH (ref 4.0–10.5)

## 2014-04-22 MED ORDER — ONDANSETRON HCL 4 MG PO TABS: 4.0000 mg | ORAL_TABLET | Freq: Three times a day (TID) | ORAL | Status: DC | PRN

## 2014-04-22 MED ORDER — IBUPROFEN 600 MG PO TABS: ORAL_TABLET | ORAL | Status: DC

## 2014-04-22 MED ORDER — OXYCODONE-ACETAMINOPHEN 5-325 MG PO TABS: 1.0000 | ORAL_TABLET | ORAL | Status: DC | PRN

## 2014-04-22 NOTE — Progress Notes (Signed)
Dana Vasquez is a5 y.o.  [id number]  Post Op Date # 2:  TAH/BS/LOA  Subjective: Patient is Doing well postoperatively. Patient has some pain but was just given Percocet and Ibuprofen that has been suscessful in the past in pain management. , admits to mild lightheadedness with initial ambulation but it resolves as she ambulates. Tolerating regular diet and voiding without difficulty.  Has been having gas pains but resolved with bathroom visit.   Objective: Vital signs in last 24 hours: Temp:  [97.5 F (36.4 C)-98.6 F (37 C)] 98.3 F (36.8 C) (06/26 0609) Pulse Rate:  [58-78] 66 (06/26 0609) Resp:  [16-20] 18 (06/26 0609) BP: (122-154)/(71-87) 141/76 mmHg (06/26 0609) SpO2:  [94 %-99 %] 95 % (06/26 0609)  Intake/Output from previous day: 06/25 0701 - 06/26 0700 In: 480 [P.O.:480] Out: 850 [Urine:850] Intake/Output this shift:    Recent Labs Lab 04/21/14 0510 04/22/14 0523  WBC 17.1* 11.8*  HGB 8.6* 8.1*  HCT 27.0* 25.6*  PLT 353 327    No results found for this basename: NA, K, CL, CO2, BUN, CREATININE, CALCIUM, LABALBU, PROT, BILITOT, ALKPHOS, ALT, AST, GLUCOSE,  in the last 168 hours  EXAM: General: alert, cooperative and no distress Resp: clear to auscultation bilaterally Cardio: regular rate and rhythm, S1, S2 normal, no murmur, click, rub or gallop GI: Bowel sounds present and honeycomb dressing dry and intact. Extremities: No calf tenderness   Assessment: s/p Procedure(s): Total ABDOMINAL HYSTERECTOMY partial right salpingectomy LYSIS OF ADHESION: progressing well and anemia  Plan: Discharge home  LOS: 2 days    Dorrene Bently, PA-C 04/22/2014 8:13 AM

## 2014-04-22 NOTE — Discharge Summary (Signed)
Physician Discharge Summary  Patient ID: Dana Vasquez MRN: [id number] DOB/AGE: Jul 18, 1966 49 y.o.  Admit date: 04/20/2014 Discharge date: 04/22/2014   Discharge Diagnoses: Symptomatic Uterine Fibroids, Menorrhagia, Anemia, Diabetes Mellitus and  Pelvic Adhesions  Active Problems:   ANEMIA   Obesity, Class III, BMI 40-49.9 (morbid obesity)   Menorrhagia   Fibroids   Type II or unspecified type diabetes mellitus without mention of complication, not stated as uncontrolled   Fibroid, uterine   Operation: Total Abdominal Hysterectomy with Left Salpingectomy,  Partial Right Salpingectomy and Lysis of Adhesions   Discharged Condition: Good  Hospital Course:  On the date of admission the patient underwent the aforementioned procedures and tolerated them well. Her post operative course was unremarkable with the patient tolerating a post operative hemoglobin of 8.1. She went on to resume bowel and bladder function by post operative day #2 and was therefore deemed ready for discharge home.  Disposition: 01-Home or Self Care  Discharge Medications:    Medication List         albuterol 108 (90 BASE) MCG/ACT inhaler  Commonly known as:  PROVENTIL HFA;VENTOLIN HFA  Inhale into the lungs every 6 (six) hours as needed for wheezing or shortness of breath.     BOIL EASE MAXIMUM STRENGTH EX  Apply 1 application topically as needed.     ibuprofen 600 MG tablet  Commonly known as:  ADVIL,MOTRIN  1  po  pc every 6 hours for  5 days then prn-pain     IRON CR PO  Take by mouth.     levothyroxine 75 MCG tablet  Commonly known as:  SYNTHROID, LEVOTHROID  Take 75 mcg by mouth daily before breakfast.     liothyronine 5 MCG tablet  Commonly known as:  CYTOMEL  Take 5 mcg by mouth daily.     losartan 25 MG tablet  Commonly known as:  COZAAR  Take 25 mg by mouth daily.     ondansetron 4 MG tablet  Commonly known as:  ZOFRAN  Take 1 tablet (4 mg total) by mouth every 8 (eight) hours as  needed for nausea or vomiting.     oxyCODONE-acetaminophen 5-325 MG per tablet  Commonly known as:  PERCOCET/ROXICET  Take 1-2 tablets by mouth every 4 (four) hours as needed for severe pain (moderate to severe pain (when tolerating fluids)).           Follow-up: Dr. Kendall Flack on May 25, 2014 at 10:15 a.m.    SignedEarnstine Regal, PA-C 04/22/2014, 8:21 AM

## 2014-04-23 LAB — TYPE AND SCREEN
ABO/RH(D): O POS
Antibody Screen: NEGATIVE
UNIT DIVISION: 0
Unit division: 0

## 2014-05-25 DIAGNOSIS — R5383 Other fatigue: Secondary | ICD-10-CM | POA: Insufficient documentation

## 2014-06-05 ENCOUNTER — Emergency Department (HOSPITAL_COMMUNITY): Payer: BC Managed Care – PPO

## 2014-06-05 ENCOUNTER — Emergency Department (HOSPITAL_COMMUNITY)
Admission: EM | Admit: 2014-06-05 | Discharge: 2014-06-05 | Disposition: A | Discharge status: Home / Self Care | Payer: BC Managed Care – PPO | Attending: Emergency Medicine | Admitting: Emergency Medicine

## 2014-06-05 ENCOUNTER — Encounter (HOSPITAL_COMMUNITY): Payer: Self-pay | Admitting: Emergency Medicine

## 2014-06-05 DIAGNOSIS — Z8739 Personal history of other diseases of the musculoskeletal system and connective tissue: Secondary | ICD-10-CM | POA: Insufficient documentation

## 2014-06-05 DIAGNOSIS — Z791 Long term (current) use of non-steroidal anti-inflammatories (NSAID): Secondary | ICD-10-CM | POA: Insufficient documentation

## 2014-06-05 DIAGNOSIS — J45909 Unspecified asthma, uncomplicated: Secondary | ICD-10-CM | POA: Insufficient documentation

## 2014-06-05 DIAGNOSIS — E119 Type 2 diabetes mellitus without complications: Secondary | ICD-10-CM | POA: Insufficient documentation

## 2014-06-05 DIAGNOSIS — Z859 Personal history of malignant neoplasm, unspecified: Secondary | ICD-10-CM | POA: Insufficient documentation

## 2014-06-05 DIAGNOSIS — E039 Hypothyroidism, unspecified: Secondary | ICD-10-CM | POA: Diagnosis not present

## 2014-06-05 DIAGNOSIS — R071 Chest pain on breathing: Secondary | ICD-10-CM | POA: Diagnosis present

## 2014-06-05 DIAGNOSIS — I10 Essential (primary) hypertension: Secondary | ICD-10-CM | POA: Diagnosis not present

## 2014-06-05 DIAGNOSIS — Z79899 Other long term (current) drug therapy: Secondary | ICD-10-CM | POA: Diagnosis not present

## 2014-06-05 DIAGNOSIS — F411 Generalized anxiety disorder: Secondary | ICD-10-CM | POA: Diagnosis not present

## 2014-06-05 DIAGNOSIS — R0789 Other chest pain: Secondary | ICD-10-CM

## 2014-06-05 LAB — BASIC METABOLIC PANEL
Anion gap: 12 (ref 5–15)
BUN: 16 mg/dL (ref 6–23)
CALCIUM: 9 mg/dL (ref 8.4–10.5)
CO2: 24 meq/L (ref 19–32)
Chloride: 105 mEq/L (ref 96–112)
Creatinine, Ser: 0.89 mg/dL (ref 0.50–1.10)
GFR calc Af Amer: 88 mL/min — ABNORMAL LOW (ref >90)
GFR calc non Af Amer: 76 mL/min — ABNORMAL LOW (ref >90)
GLUCOSE: 107 mg/dL — AB (ref 70–99)
Potassium: 3.9 mEq/L (ref 3.7–5.3)
Sodium: 141 mEq/L (ref 137–147)

## 2014-06-05 LAB — CBC WITH DIFFERENTIAL/PLATELET
BASOS PCT: 0 % (ref 0–1)
Basophils Absolute: 0 10*3/uL (ref 0.0–0.1)
EOS ABS: 0.1 10*3/uL (ref 0.0–0.7)
Eosinophils Relative: 1 % (ref 0–5)
HCT: 29.1 % — ABNORMAL LOW (ref 36.0–46.0)
Hemoglobin: 9.3 g/dL — ABNORMAL LOW (ref 12.0–15.0)
Lymphocytes Relative: 24 % (ref 12–46)
Lymphs Abs: 2.9 10*3/uL (ref 0.7–4.0)
MCH: 21.6 pg — AB (ref 26.0–34.0)
MCHC: 32 g/dL (ref 30.0–36.0)
MCV: 67.5 fL — ABNORMAL LOW (ref 78.0–100.0)
MONO ABS: 1 10*3/uL (ref 0.1–1.0)
Monocytes Relative: 8 % (ref 3–12)
Neutro Abs: 8 10*3/uL — ABNORMAL HIGH (ref 1.7–7.7)
Neutrophils Relative %: 67 % (ref 43–77)
PLATELETS: 366 10*3/uL (ref 150–400)
RBC: 4.31 MIL/uL (ref 3.87–5.11)
RDW: 17.3 % — ABNORMAL HIGH (ref 11.5–15.5)
WBC: 12 10*3/uL — ABNORMAL HIGH (ref 4.0–10.5)

## 2014-06-05 LAB — I-STAT TROPONIN, ED: TROPONIN I, POC: 0 ng/mL (ref 0.00–0.08)

## 2014-06-05 LAB — D-DIMER, QUANTITATIVE: D-Dimer, Quant: 3.24 ug/mL-FEU — ABNORMAL HIGH (ref 0.00–0.48)

## 2014-06-05 MED ORDER — IBUPROFEN 800 MG PO TABS: 800.0000 mg | ORAL_TABLET | Freq: Three times a day (TID) | ORAL | Status: DC

## 2014-06-05 MED ORDER — KETOROLAC TROMETHAMINE 60 MG/2ML IM SOLN
60.0000 mg | Freq: Once | INTRAMUSCULAR | Status: AC
  Administered 2014-06-05: 60 mg via INTRAMUSCULAR
  Filled 2014-06-05: qty 2

## 2014-06-05 MED ORDER — HYDROCODONE-ACETAMINOPHEN 5-325 MG PO TABS: 2.0000 | ORAL_TABLET | ORAL | Status: DC | PRN

## 2014-06-05 MED ORDER — HYDROCODONE-ACETAMINOPHEN 5-325 MG PO TABS
1.0000 | ORAL_TABLET | Freq: Once | ORAL | Status: AC
  Administered 2014-06-05: 1 via ORAL
  Filled 2014-06-05: qty 1

## 2014-06-05 MED ORDER — IOHEXOL 350 MG/ML SOLN
80.0000 mL | Freq: Once | INTRAVENOUS | Status: AC | PRN
  Administered 2014-06-05: 80 mL via INTRAVENOUS

## 2014-06-05 NOTE — Discharge Instructions (Signed)

## 2014-06-05 NOTE — ED Provider Notes (Signed)
CSN: [id number]     Arrival date & time 06/05/14  0525 History   First MD Initiated Contact with Patient 06/05/14 0540     Chief Complaint  Patient presents with  . Chest Pain     (Consider location/radiation/quality/duration/timing/severity/associated sxs/prior Treatment) HPI  This is a 48 year old female with history of fibromyalgia, hypertension, hyperlipidemia, diabetes who presents with chest pain. Patient reports onset of chest pain at approximately 4:30 AM. She states that it woke her up from her sleep. She reports that it is left-sided and sharp. It radiates down her left arm. She's never had anything like this before. Pain is worse with breathing and movement. She was given an aspirin and nitroglycerin by EMS. Currently her pain is 4/10. Denies any history of blood clots. Did recently have a hysterectomy at the end of June.  Past Medical History  Diagnosis Date  . Fibromyalgia   . Euthyroid   . Fatigue   . GERD (gastroesophageal reflux disease)   . Bruises easily   . Knee pain   . Allergy   . Thyroid disease     thyroidectomy 1995  . SVD (spontaneous vaginal delivery)     x 3  . Hypertension   . Cancer     1995  . Asthma     allergy related - rarely uses inhaler  . Sleep apnea     uses CPAP  . Hypothyroidism   . Diabetes mellitus without complication     borderline - no meds  . Headache(784.0)     otc med prn - last one 02/2014  . Arthritis     knees - otc med prn  . Depression     no meds  . Anemia   . History of blood transfusion 2000    In [place], [place] at [facility] - ? 4 units transfused  . Anxiety     no meds   Past Surgical History  Procedure Laterality Date  . Tubal ligation    . Fallopian tubes removed      left tube removed per patient - laparotomy  . Thyroidectomy    . Breath tek h pylori  09/18/2011    Procedure: BREATH TEK H PYLORI;  Surgeon: Pedro Earls, MD;  Location: [facility];  Service: General;  Laterality: N/A;  . Tubal ligation     . Colonoscopy    . Wisdom tooth extraction    . Dilation and curettage of uterus  1988    endometriosis  . Abdominal hysterectomy N/A 04/20/2014    Procedure: Total ABDOMINAL HYSTERECTOMY partial right salpingectomy;  Surgeon: Eldred Manges, MD;  Location: [facility];  Service: Gynecology;  Laterality: N/A;  . Lysis of adhesion N/A 04/20/2014    Procedure: LYSIS OF ADHESION;  Surgeon: Eldred Manges, MD;  Location: [facility];  Service: Gynecology;  Laterality: N/A;   Family History  Problem Relation Age of Onset  . Hypertension Mother   . Cancer Father   . Cancer Brother   . Early death Brother   . Stroke Maternal Uncle   . Heart disease Maternal Uncle    History  Substance Use Topics  . Smoking status: Never Smoker   . Smokeless tobacco: Never Used  . Alcohol Use: Yes     Comment: socially   OB History   Grav Para Term Preterm Abortions TAB SAB Ect Mult Living                 Review of Systems  Constitutional: Negative for fever.  Respiratory: Positive for chest tightness. Negative for cough and shortness of breath.   Cardiovascular: Positive for chest pain. Negative for palpitations and leg swelling.  Gastrointestinal: Negative for nausea, vomiting and abdominal pain.  Genitourinary: Negative for dysuria.  Musculoskeletal: Negative for back pain.  Skin: Negative for rash.  Neurological: Negative for headaches.  All other systems reviewed and are negative.     Allergies  Nitrofurantoin monohyd macro  Home Medications   Prior to Admission medications   Medication Sig Start Date End Date Taking? Authorizing Provider  albuterol (PROVENTIL HFA;VENTOLIN HFA) 108 (90 BASE) MCG/ACT inhaler Inhale into the lungs every 6 (six) hours as needed for wheezing or shortness of breath.    Historical Provider, MD  Benzocaine (BOIL EASE MAXIMUM STRENGTH EX) Apply 1 application topically as needed.    Historical Provider, MD  Ferrous Sulfate (IRON CR PO) Take by mouth.       Historical Provider, MD  ibuprofen (ADVIL,MOTRIN) 600 MG tablet 1  po  pc every 6 hours for  5 days then prn-pain 04/22/14   Earnstine Regal, PA-C  levothyroxine (SYNTHROID, LEVOTHROID) 75 MCG tablet Take 75 mcg by mouth daily before breakfast.    Historical Provider, MD  liothyronine (CYTOMEL) 5 MCG tablet Take 5 mcg by mouth daily.    Historical Provider, MD  losartan (COZAAR) 25 MG tablet Take 25 mg by mouth daily.    Historical Provider, MD  ondansetron (ZOFRAN) 4 MG tablet Take 1 tablet (4 mg total) by mouth every 8 (eight) hours as needed for nausea or vomiting. 04/22/14   Earnstine Regal, PA-C  oxyCODONE-acetaminophen (PERCOCET/ROXICET) 5-325 MG per tablet Take 1-2 tablets by mouth every 4 (four) hours as needed for severe pain (moderate to severe pain (when tolerating fluids)). 04/22/14   Elmira Powell, PA-C   BP 125/71  Pulse 73  Temp(Src) 98.7 F (37.1 C) (Oral)  Resp 19  Ht 5\' 4"  (1.626 m)  Wt 260 lb (117.935 kg)  BMI 44.61 kg/m2  SpO2 97% Physical Exam  Nursing note and vitals reviewed. Constitutional: She is oriented to person, place, and time. She appears well-developed and well-nourished. No distress.  HENT:  Head: Normocephalic and atraumatic.  Cardiovascular: Normal rate, regular rhythm and normal heart sounds.   No murmur heard. Pulmonary/Chest: Effort normal and breath sounds normal. No respiratory distress. She has no wheezes. She exhibits tenderness.  Tenderness palpation over the left chest wall  Abdominal: Soft. Bowel sounds are normal. There is no tenderness. There is no rebound and no guarding.  Musculoskeletal:  Trace bilateral lower extremity edema  Neurological: She is alert and oriented to person, place, and time.  Skin: Skin is warm and dry.  Psychiatric: She has a normal mood and affect.    ED Course  Procedures (including critical care time) Labs Review Labs Reviewed  D-DIMER, QUANTITATIVE - Abnormal; Notable for the following:    D-Dimer, Quant 3.24  (*)    All other components within normal limits  CBC WITH DIFFERENTIAL - Abnormal; Notable for the following:    WBC 12.0 (*)    Hemoglobin 9.3 (*)    HCT 29.1 (*)    MCV 67.5 (*)    MCH 21.6 (*)    RDW 17.3 (*)    Neutro Abs 8.0 (*)    All other components within normal limits  BASIC METABOLIC PANEL - Abnormal; Notable for the following:    Glucose, Bld 107 (*)    GFR calc non  Af Amer 76 (*)    GFR calc Af Amer 88 (*)    All other components within normal limits  I-STAT TROPOININ, ED    Imaging Review No results found.   EKG Interpretation   Date/Time:  Sunday June 05 2014 05:34:38 EDT Ventricular Rate:  74 PR Interval:  153 QRS Duration: 86 QT Interval:  384 QTC Calculation: 426 R Axis:   50 Text Interpretation:  Sinus rhythm EKG WITHIN NORMAL LIMITS Confirmed by  Dina Rich  MD, [place] ([id number]) on 06/05/2014 5:37:47 AM      MDM   Final diagnoses:  None    Patient presents with CP.  Sharp and pleuritic in nature.  Worse with movement.  Low risk ACS and normal EKG.  Reproducible CP on exam.  However, patient also had surgery within the last 6 weeks.  Will send screening d-dimer for PE r/o.  Dimer +.  CTA ordered.  Signed out to Dr. Betsey Holiday.    Merryl Hacker, MD 06/05/14 2007

## 2014-06-05 NOTE — ED Provider Notes (Signed)
Patient signed out to me with CT angiography chest pending, rule out PE. Patient seen and evaluated for chest pain was felt to be musculoskeletal in nature, but with recent surgery and elevated d-dimer, CT angiography was performed. CT was negative for acute findings including no evidence of PE. Patient will be discharged home to continue treatment for musculoskeletal chest pain. Followup with primary doctor.  Orpah Greek, MD 06/05/14 [phone]

## 2014-06-05 NOTE — ED Notes (Signed)
Pt from home. Woke up around 0430 with left side CP that radiated down left arm. Called EMS. EMS gave pt 1 nitro tablet Sl and pt took 325mg  ASA x 3. CP went from 8/10 to 7/10. Pt refused another nitro tablet b/c it gave her a headache. Pt states CP increases with movement and inspiration. Pt alert and oriented x 4, neuro intact. Skin warm and dry.

## 2014-06-14 ENCOUNTER — Encounter: Payer: BC Managed Care – PPO | Attending: Endocrinology | Admitting: Dietician

## 2014-06-14 DIAGNOSIS — Z713 Dietary counseling and surveillance: Secondary | ICD-10-CM | POA: Diagnosis not present

## 2014-06-14 DIAGNOSIS — E119 Type 2 diabetes mellitus without complications: Secondary | ICD-10-CM | POA: Insufficient documentation

## 2014-06-14 NOTE — Patient Instructions (Addendum)
-  Increase exercise!  Calories: 1600-1800 per day Carbs: 180-200 grams (12-13 choices per day)  Have two 15-gram servings per meal and 1 serving at snacks  -Fill up on non-starchy vegetables (any veggie that is not corn, peas, beans, or potatoes)  -Avoid skipping meals -Have something every 3-5 hours  -Try [place] bars

## 2014-06-14 NOTE — Progress Notes (Signed)
Appt start time: 0910 end time:  0940.  Follow up: Dana Vasquez returns today reporting that she feels like she has a good handle on diet regarding diabetes. The patient reports she has lost 20 pounds in the last 6 months. Considered RYGB in 2012 but did not undergo surgery. She currently feels like she can lose the weight on her own. Dana Vasquez had a hysterectomy in June and wants to start exercising again soon. She has been checking her blood sugars 1x a day, average fasting BG 109 mg/dL. 122 mg/dL this morning.   Current HbA1c: 6.5%  Preferred Learning Style:   No preference indicated   Learning Readiness:   Change in progress  MEDICATIONS: see list.  DIETARY INTAKE: Usual eating pattern includes 2 meals and 2-3 snacks per day. Everyday foods include chix, pork rinds, nuts.  Avoided foods include sweet tea, juices.    Usual physical activity: walking  Progress Towards Goal(s):  In progress.   Nutritional Diagnosis:  NI-5.8.2 Excessive carbohydrate intake As related to previous intake of large amounts of juice and sweet tea, large portions of rice and other starches.  As evidenced by pt statements of same, HGA1c 6.5.    Intervention:  Nutrition counseling provided.  Discussed diabetes disease process and treatment options.  Discussed physiology of diabetes and role of obesity on insulin resistance.  Encouraged moderate weight reduction to improve glucose levels.  Discussed role of medications and diet in glucose control  Provided education on macronutrients on glucose levels.  Provided education on carb counting, importance of regularly scheduled meals/snacks, and meal planning  Discussed effects of physical activity on glucose levels and long-term glucose control.  Recommended 150 minutes of physical activity/week.  Teaching Method Utilized:  Visual Auditory  Handouts given during visit include:  Meal planning card  My Plate  99J CHO + protein snacks  Barriers to  learning/adherence to lifestyle change: minimal  Key areas of emphasis included what are carb rich foods, what is an appropriate amount of carbs in a meal and a day, how to track using the Plate Method or the carb counting method.   Demonstrated degree of understanding via:  Teach Back   Monitoring/Evaluation:  Dietary intake, exercise, portion control, and body weight prn.

## 2014-07-18 ENCOUNTER — Ambulatory Visit: Payer: BC Managed Care – PPO | Admitting: Cardiovascular Disease

## 2014-09-06 ENCOUNTER — Ambulatory Visit: Payer: BC Managed Care – PPO | Admitting: Cardiovascular Disease

## 2015-02-03 ENCOUNTER — Other Ambulatory Visit: Payer: Self-pay | Admitting: Sports Medicine

## 2015-02-03 DIAGNOSIS — M5441 Lumbago with sciatica, right side: Secondary | ICD-10-CM

## 2015-02-25 ENCOUNTER — Other Ambulatory Visit: Payer: Self-pay

## 2015-04-24 ENCOUNTER — Other Ambulatory Visit: Payer: Self-pay

## 2015-05-15 ENCOUNTER — Ambulatory Visit
Admission: RE | Admit: 2015-05-15 | Discharge: 2015-05-15 | Disposition: A | Discharge status: Home / Self Care | Payer: BLUE CROSS/BLUE SHIELD | Source: Ambulatory Visit | Attending: Sports Medicine | Admitting: Sports Medicine

## 2015-05-15 DIAGNOSIS — M5441 Lumbago with sciatica, right side: Secondary | ICD-10-CM

## 2015-05-17 ENCOUNTER — Encounter: Payer: Self-pay | Admitting: Internal Medicine

## 2015-09-20 ENCOUNTER — Other Ambulatory Visit: Payer: Self-pay | Admitting: Sports Medicine

## 2015-09-20 DIAGNOSIS — M25561 Pain in right knee: Secondary | ICD-10-CM

## 2015-09-30 ENCOUNTER — Ambulatory Visit
Admission: RE | Admit: 2015-09-30 | Discharge: 2015-09-30 | Disposition: A | Discharge status: Home / Self Care | Payer: BLUE CROSS/BLUE SHIELD | Source: Ambulatory Visit | Attending: Sports Medicine | Admitting: Sports Medicine

## 2015-09-30 ENCOUNTER — Other Ambulatory Visit: Payer: BLUE CROSS/BLUE SHIELD

## 2015-09-30 DIAGNOSIS — M25561 Pain in right knee: Secondary | ICD-10-CM

## 2016-02-29 ENCOUNTER — Encounter: Payer: Self-pay | Admitting: Neurology

## 2016-02-29 ENCOUNTER — Ambulatory Visit (INDEPENDENT_AMBULATORY_CARE_PROVIDER_SITE_OTHER): Payer: BLUE CROSS/BLUE SHIELD | Admitting: Neurology

## 2016-02-29 VITALS — BP 124/86 | HR 68 | Resp 20 | Ht 63.0 in | Wt 262.0 lb

## 2016-02-29 DIAGNOSIS — G4733 Obstructive sleep apnea (adult) (pediatric): Secondary | ICD-10-CM

## 2016-02-29 DIAGNOSIS — E662 Morbid (severe) obesity with alveolar hypoventilation: Secondary | ICD-10-CM

## 2016-02-29 DIAGNOSIS — E088 Diabetes mellitus due to underlying condition with unspecified complications: Secondary | ICD-10-CM | POA: Diagnosis not present

## 2016-02-29 DIAGNOSIS — Z9989 Dependence on other enabling machines and devices: Secondary | ICD-10-CM

## 2016-02-29 NOTE — Progress Notes (Signed)
SLEEP MEDICINE CLINIC   Provider:  Larey Vasquez, M D  Referring Provider: Glendale Chard, MD Primary Care Physician:  No primary care provider on file.  Chief Complaint  Patient presents with  . New Patient (Initial Visit)    has never seen a sleep specialist, on cpap, needs a DME, rm 11, alone    HPI:  Dana Vasquez is a 50 y.o. female , seen here as a referral from Dana Vasquez for a transfer of sleep medicine care.   [name] is a 50 year old African-American right-handed female who carries a past medical history of obesity, diabetes, fibromyalgia thyroidectomy, hysterectomy, and fallopian tube removal after an ectopic pregnancy. The patient was diagnosed in 2012 almost 5 years ago, with obstructive sleep apnea in the sleep and wellness Center on [facility]. Attending technician was [place] Vasquez. She was diagnosed with moderate obstructive sleep apnea associated with snoring and hypoxia and was treated in the same night with CPAP ( SPLIT) , titrated to 11 cm water pressure. She was sent to [facility] for set up. She was placed on a medium sized Fisher-Paykel Zest mask.  She reports that she never had an M.D. follow-up, she actually never met the physician during the sleep study or afterwards. There was no pre-evaluation. Now more than 5 years after her initial sleep study she feels that her CPAP is no longer controlling her sleepiness. She received supplies by calling her phone number and they were mailed to her.  I reviewed Dana Vasquez next sleep study on the patient is a total sleep time of 308 minutes, about 20% REM sleep, her AHI was 24.3, her lowest oxygen level at night was 85%, she was noted to snore during the baseline part of her split night study. Her REM AHI was 47.8, at the time of the sleep study in 2012 she weighted 260 pounds.  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 Some days she will come home later than others, but usually she works between 8 AM and 4 PM. She has no history of shift work. She is usually home in time for dinner, and she states that even during her workday she has begun feeling less alert less restored or refreshed. At times she felt dizzy or lightheaded. Her bedtime is usually around 9 PM plus minus one hour. And in bed she usually can sleep promptly as long as she has her CPAP. She feels no longer that she can sleep without CPAP. When she first was started on CPAP she would sleep all night without even turning and with no breaks or arousals. Now she wakes up several times at night, sometimes with palpitations, she also has more frequent bad dreams- vivid dreams ,she has been  screaming loudly and her husband has been woken by this too. Her bedroom is described as cool, quiet and dark. She prefers supine or left lateral sleep and usually uses 2 pillows. She doesn't usually not have to go to the bathroom at night, she does not wake up sweaty but  with headaches. She has been woken by sharp stabbing headaches and has sometimes been waking up in the morning with morning headaches that are more generalized global. These are episodic cluster headaches and hypoxia headaches. At least once a day she has the irresistible urge to go to sleep but cannot follow her urge. She reports that  if she has the opportunity, is at rest, physically not active and mentally not stimulated, she feels sleepy. On a weekend if she is at home she can nap, between 1 and 2 hours in duration. She feels more refreshed after a nap, she does not wake up with the same headaches, and usually seems not to have the same vivid dreams. Her headaches have affected her vision, and she has risk factors for pseudotumor cerebri. Diabetes may play a role- ophthalmology exam was at [place], [place].   Sleep medical history and family sleep history:  Morbid obesity, no  tonsillectomy, no TBI, diabetes- well controlled, HTN. Surgical thyroid removal in 1995. Social history:  Married, Pharmacist, hospital , Armed forces operational officer ( flooring ), 3 children, she does not drink alcohol, she does not use tobacco products, she drinks decaffeinated coffee and tea, nor sodas.  Review of Systems: Out of a complete 14 system review, the patient complains of only the following symptoms, and all other reviewed systems are negative. Epworth score 12 , Fatigue severity score 46  , depression score 3/15 She endorsed wheezing, aching muscles, fatigue, weight gain, blurring of vision, lightheadedness, easy bruising, dizziness, sleepiness, she had thyroid Vasquez in 1995.   Social History   Social History  . Marital Status: Married    Spouse Name: N/A  . Number of Children: N/A  . Years of Education: N/A   Occupational History  . Not on file.   Social History Main Topics  . Smoking status: Never Smoker   . Smokeless tobacco: Never Used  . Alcohol Use: Yes     Comment: socially  . Drug Use: No  . Sexual Activity: Yes    Birth Control/ Protection: Surgical   Other Topics Concern  . Not on file   Social History Narrative    Family History  Problem Relation Age of Onset  . Hypertension Mother   . Vasquez Father   . Vasquez Brother   . Early death Brother   . Stroke Maternal Uncle   . Heart disease Maternal Uncle     Past Medical History  Diagnosis Date  . Fibromyalgia   . Euthyroid   . Fatigue   . GERD (gastroesophageal reflux disease)   . Bruises easily   . Knee pain   . Allergy   . Thyroid disease     thyroidectomy 1995  . SVD (spontaneous vaginal delivery)     x 3  . Hypertension   . Vasquez ([place])     1995  . Asthma     allergy related - rarely uses inhaler  . Sleep apnea     uses CPAP  . Hypothyroidism   . Diabetes mellitus without complication (HCC)     borderline - no meds  . Headache(784.0)     otc med prn - last one 02/2014  . Arthritis     knees - otc  med prn  . Depression     no meds  . Anemia   . History of blood transfusion 2000    In [place], [place] at [place] - ? 4 units transfused  . Anxiety     no meds    Past Surgical History  Procedure Laterality Date  . Tubal ligation    . Fallopian tubes removed      left tube removed per patient - laparotomy  . Thyroidectomy    . Breath tek h pylori  09/18/2011    Procedure: BREATH TEK H PYLORI;  Surgeon: Pedro Earls,  MD;  Location: [facility];  Service: General;  Laterality: N/A;  . Tubal ligation    . Colonoscopy    . Wisdom tooth extraction    . Dilation and curettage of uterus  1988    endometriosis  . Abdominal hysterectomy N/A 04/20/2014    Procedure: Total ABDOMINAL HYSTERECTOMY partial right salpingectomy;  Surgeon: Eldred Manges, MD;  Location: [facility];  Service: Gynecology;  Laterality: N/A;  . Lysis of adhesion N/A 04/20/2014    Procedure: LYSIS OF ADHESION;  Surgeon: Eldred Manges, MD;  Location: [facility];  Service: Gynecology;  Laterality: N/A;    Current Outpatient Prescriptions  Medication Sig Dispense Refill  . albuterol (PROVENTIL HFA;VENTOLIN HFA) 108 (90 BASE) MCG/ACT inhaler Inhale into the lungs every 6 (six) hours as needed for wheezing or shortness of breath.    . levothyroxine (SYNTHROID, LEVOTHROID) 75 MCG tablet Take 75 mcg by mouth daily before breakfast.    . losartan (COZAAR) 25 MG tablet Take 25 mg by mouth daily.     No current facility-administered medications for this visit.    Allergies as of 02/29/2016 - Review Complete 02/29/2016  Allergen Reaction Noted  . Nitrofurantoin monohyd macro Other (See Comments) 02/12/2012    Vitals: BP 124/86 mmHg  Pulse 68  Resp 20  Ht 5' 3" (1.6 m)  Wt 262 lb (118.842 kg)  BMI 46.42 kg/m2  LMP 04/07/2014 Last Weight:  Wt Readings from Last 1 Encounters:  02/29/16 262 lb (118.842 kg)   QGB:EEFE mass index is 46.42 kg/(m^2).     Last Height:   Ht Readings from Last 1 Encounters:  02/29/16 5'  3" (1.6 m)    Physical exam:  General: The patient is awake, alert and appears not in acute distress. The patient is well groomed. Head: Normocephalic, atraumatic. Neck is supple. Mallampati 5 -the uvula is not visible, the patient has mild retrognathia and a larger tongue all contributing to a more narrow upper airway. Anoxic since was 15.5 inches , her nasal airflow is unrestricted. She does not have TMJ pain or clicking.  nCardiovascular:  Regular rate and rhythm , without  murmurs or carotid bruit, and without distended neck veins. Respiratory: Lungs are clear to auscultation. Skin:  Without evidence of edema, or rash Trunk: BMI is morbidly obese   Neurologic exam : The patient is awake and alert, oriented to place and time.   Memory subjective described as intact. Attention span & concentration ability appears normal.  Speech is fluent,  without dysarthria, dysphonia or aphasia.  Mood and affect are appropriate.  Cranial nerves: She has been starting to crave more salt, but she reports no different taste or smell sensation. Pupils are equal and briskly reactive to light. Funduscopic exam without evidence of pallor or edema.  Extraocular movements  in vertical and horizontal planes intact and without nystagmus. Visual fields by finger perimetry are intact. Hearing to finger rub intact.  Facial sensation intact to fine touch.Facial motor strength is symmetric and tongue and uvula move midline. Shoulder shrug was symmetrical.   Motor exam:  Normal tone, muscle bulk and symmetric strength in all extremities. Sensory:  Fine touch, pinprick and vibration were tested in all extremities. Proprioception tested in the upper extremities was normal. Coordination: Rapid alternating movements in the fingers/hands was normal. Finger-to-nose maneuver  normal without evidence of ataxia, dysmetria or tremor. Gait and station: Patient walks without assistive device and is able unassisted to climb up to  the exam table. Strength  within normal limits.  Stance is stable and normal.  Deep tendon reflexes: in the  upper and lower extremities are symmetric and intact. Babinski maneuver response is  downgoing.  The patient was advised of the nature of the diagnosed sleep disorder , the treatment options and risks for general a health and wellness arising from not treating the condition.  I spent more than 45 minutes of face to face time with the patient. Greater than 50% of time was spent in counseling and coordination of care. We have discussed the diagnosis and differential and I answered the patient's questions.     Assessment:   Dear Dana Vasquez, Thank you very much for sending this very pleasant lady my way, I think there are several indicators that her apnea may still be controlled but her sleep is yet fragmented and lacks the same quality it used to have. Especially the episodic cluster headaches in the morning headaches but is concerned for idiopathic intracranial hypertension , hypercapnia and hypoxia.  After physical and neurologic examination, review of laboratory studies,  Personal review of imaging studies, reports of other /same  Imaging studies ,  Results of polysomnography/ neurophysiology testing and pre-existing records as far as provided in visit., my assessment is   1) [name] reports weight gain just over the last couple of months by about 20 pounds, an increased craving for salt, and an exacerbation of hypersomnia. Her sleep is more fragmented she wakes up she is more restless all this has been just recently changing.   Her weight gain which is rather rapid and her craving salt may be of metabolic origin-  She is followed by Dr. Chalmers Cater for endocrinology, and I will request recent labs regarding diabetes, thyroid function, renal function.  2)She has been using her CPAP compliantly at the same level is facet 5 years ago 11 cm water pressure for compliance with 100% 4 days and 97%  for over 4 hours of daily use. Average user time is 6 hours and 50 minutes, her residual AHI is 0.3 based on the download she should be optimally treated yet she reports that her sleep has not the same quality, does not restored refresh her and she wakes up with headaches. For this reason I will ask Mrs. Keesling to come in for a sleep study with hypercapnia measures and hypoxemia measures. This may explain her hypersomnia the dry mouth in spite of compliantly using CPAP.   3) morbid obesity. Nutrional consult requested, the patient may be a candidate for gastric bypass.   4) episodic cluster headache, sleep related headaches- I am  concerned that her headaches relate to a lack of oxygen or retention and CO2.  5) her eye exam was performed by an optometrist, and pictures of the right never taken, apparently negative for diabetic changes or papilledema. If her blurring of vision continues in conjunction with her headaches after our sleep study is completed, I may consider a spinal tap to document opening pressure.    Plan:  Treatment plan and additional workup :  SPLIT study with hypercapnia and Oxygen recording. I know that the patient is using CPAP compliantly and I know that the CPAP has controlled the apnea very well, what I don't know is if she still has hypoxia or hypercapnia. If this should be the case in spite of CPAP use I will ask the attending technician to titrate this patient to oxygen. Please note that the patient reports episodic cluster headaches waking her up from  sleep. These are usually treated with oxygen.  I have instructed the technologist to call me or text me at home if any questions arise during the study in regards to CPAP titration, switching to BiPAP, adding oxygen.  Rv after PSG/ SPLIT.     Asencion Partridge Dohmeier MD  02/29/2016   CC: Dana Vasquez, [address] [place], [place] [id number]

## 2016-03-26 ENCOUNTER — Telehealth: Payer: Self-pay | Admitting: Neurology

## 2016-03-26 NOTE — Telephone Encounter (Signed)
The patient called 1 hour before her sleep study appointment, indicated that she was not feeling well, running a low grade temp of 100.4. I indicated that she needed to reschedule the visit.

## 2016-03-28 DIAGNOSIS — D573 Sickle-cell trait: Secondary | ICD-10-CM | POA: Insufficient documentation

## 2016-04-03 DIAGNOSIS — R053 Chronic cough: Secondary | ICD-10-CM | POA: Insufficient documentation

## 2016-04-03 DIAGNOSIS — R490 Dysphonia: Secondary | ICD-10-CM | POA: Insufficient documentation

## 2016-04-03 DIAGNOSIS — R05 Cough: Secondary | ICD-10-CM | POA: Insufficient documentation

## 2016-04-03 DIAGNOSIS — J0141 Acute recurrent pansinusitis: Secondary | ICD-10-CM | POA: Insufficient documentation

## 2016-04-14 ENCOUNTER — Ambulatory Visit (INDEPENDENT_AMBULATORY_CARE_PROVIDER_SITE_OTHER): Payer: BLUE CROSS/BLUE SHIELD | Admitting: Neurology

## 2016-04-14 DIAGNOSIS — Z9989 Dependence on other enabling machines and devices: Secondary | ICD-10-CM

## 2016-04-14 DIAGNOSIS — G4733 Obstructive sleep apnea (adult) (pediatric): Secondary | ICD-10-CM | POA: Diagnosis not present

## 2016-04-14 DIAGNOSIS — E662 Morbid (severe) obesity with alveolar hypoventilation: Secondary | ICD-10-CM | POA: Diagnosis not present

## 2016-04-14 DIAGNOSIS — E088 Diabetes mellitus due to underlying condition with unspecified complications: Secondary | ICD-10-CM

## 2016-04-18 ENCOUNTER — Telehealth: Payer: Self-pay

## 2016-04-18 DIAGNOSIS — G4733 Obstructive sleep apnea (adult) (pediatric): Secondary | ICD-10-CM

## 2016-04-18 NOTE — Telephone Encounter (Signed)
I spoke to pt regarding her sleep study results. I advised her that her sleep study revealed mild osa and Dr. Brett Fairy recommends starting a new cpap. Pt is agreeable to starting a new cpap. Pt was the order for cpap sent to a [facility]. Will send to [facility]. Pt verbalized understanding. A follow up appt was scheduled for 8/30 at 2:30. Pt verbalized understanding of results. Pt had no questions at this time but was encouraged to call back if questions arise.

## 2016-04-25 ENCOUNTER — Ambulatory Visit (INDEPENDENT_AMBULATORY_CARE_PROVIDER_SITE_OTHER): Payer: BLUE CROSS/BLUE SHIELD | Admitting: Allergy and Immunology

## 2016-04-25 ENCOUNTER — Encounter: Payer: Self-pay | Admitting: Allergy and Immunology

## 2016-04-25 VITALS — BP 130/90 | HR 76 | Temp 98.0°F | Resp 16 | Ht 63.98 in | Wt 263.4 lb

## 2016-04-25 DIAGNOSIS — R062 Wheezing: Secondary | ICD-10-CM

## 2016-04-25 DIAGNOSIS — J310 Chronic rhinitis: Secondary | ICD-10-CM | POA: Diagnosis not present

## 2016-04-25 DIAGNOSIS — R059 Cough, unspecified: Secondary | ICD-10-CM

## 2016-04-25 DIAGNOSIS — R05 Cough: Secondary | ICD-10-CM | POA: Diagnosis not present

## 2016-04-25 MED ORDER — ALBUTEROL SULFATE HFA 108 (90 BASE) MCG/ACT IN AERS: 2.0000 | INHALATION_SPRAY | RESPIRATORY_TRACT | Status: DC | PRN

## 2016-04-25 MED ORDER — BECLOMETHASONE DIPROPIONATE 80 MCG/ACT IN AERS: 2.0000 | INHALATION_SPRAY | Freq: Two times a day (BID) | RESPIRATORY_TRACT | Status: DC

## 2016-04-25 MED ORDER — FEXOFENADINE HCL 180 MG PO TABS: 180.0000 mg | ORAL_TABLET | Freq: Every day | ORAL | Status: DC

## 2016-04-25 NOTE — Progress Notes (Signed)
NEW PATIENT NOTE  RE: Dana Vasquez MRN: [id number] DOB: [birth year] [facility] [address] [place] [address] Date of Office Visit: 04/25/2016  Dear Dana Manges, MD:  I had the pleasure of seeing Dana Vasquez today in initial evaluation, as you recall-- Subjective:  Dana Vasquez is a 50 y.o. female who presents today for Cough and Wheezing  Assessment:   1. History of cough and wheeze, in no respiratory distress with clear lung exam and normal oxygenation, history appears consistent with persistent asthma.   2. Chronic rhinitis, suspected allergic etiology.    3.      Complex medical history. Plan:   Meds ordered this encounter  Medications  . beclomethasone (QVAR) 80 MCG/ACT inhaler    Sig: Inhale 2 puffs into the lungs 2 (two) times daily. Rinse, Gargle and Spit after each use.    Dispense:  1 Inhaler    Refill:  5  . albuterol (PROAIR HFA) 108 (90 Base) MCG/ACT inhaler    Sig: Inhale 2 puffs into the lungs every 4 (four) hours as needed for wheezing or shortness of breath.    Dispense:  1 Inhaler    Refill:  2  . fexofenadine (ALLEGRA) 180 MG tablet    Sig: Take 1 tablet (180 mg total) by mouth daily.    Dispense:  30 tablet    Refill:  5  1. Avoidance:  As discussed. 2. Antihistamine: Allegra 180mg  by mouth once daily for runny nose or itching. 3. Nasal Spray: Nasacort AQ 1-2 spray(s) each nostril once daily for stuffy nose or drainage.  4. Inhalers:  Rescue: ProAir 2 puffs every 4 hours as needed for cough or wheeze.       -May use 2 puffs 10-20 minutes prior to exercise.  Preventative: QVAR 57mcg 2  puffs twice daily (Rinse, gargle, and spit out after use). 5. Eye Drops: Zaditor one  drop(s) each eye twcie daily for itchy eyes as needed. 6. Nasal Saline wash each evening at shower time. 7. Follow up Visit: for skin testing off antihistamines 72 hours prior to appointment.  HPI: Dana Vasquez presents in initial evaluation  of recurring cough and congestion.  She describes a 5 year history of rhinorrhea, congestion, sneezing, itchy watery eyes, postnasal drip, cough, wheeze and chest congestion, worse in the last 2 months.  She feels her symptoms are year-round and a few times a year, has been diagnosed with bronchitis receiving antibiotics and cortisone injections recently.  She is not aware of a specific asthma diagnosis.  She feels pollen, outdoors and fluctuant weather patterns are provoking factors for her symptoms and has noted exercise and nocturnal induced difficulty.  No noted history of food sensitivities, reflux or sinus infections, but is interested in allergy testing.  She saw Dr. Wilburn Vasquez, ENT a year ago but cannot recall whether had a sinus CT scan or last chest x-ray.  Denies ED or Urgent care visits.  Medical History: Past Medical History  Diagnosis Date  . Fibromyalgia   . Euthyroid   . Fatigue   . GERD (gastroesophageal reflux disease)   . Bruises easily   . Knee pain   . Allergy   . Thyroid disease     thyroidectomy 1995  . SVD (spontaneous vaginal delivery)     x 3  . Hypertension   . Cancer ([place])     1995  . Asthma     allergy related - rarely uses inhaler  .  Sleep apnea     uses CPAP  . Hypothyroidism   . Diabetes mellitus without complication (HCC)     borderline - no meds  . Headache(784.0)     otc med prn - last one 02/2014  . Arthritis     knees - otc med prn  . Depression     no meds  . Anemia   . History of blood transfusion 2000    In [place], [place] at [place] - ? 4 units transfused  . Anxiety     no meds   Surgical History: Past Surgical History  Procedure Laterality Date  . Tubal ligation    . Fallopian tubes removed      left tube removed per patient - laparotomy  . Thyroidectomy    . Breath tek h pylori  09/18/2011    Procedure: BREATH TEK H PYLORI;  Surgeon: [place] Earls, MD;  Location: [facility];  Service: General;  Laterality: N/A;  . Tubal  ligation    . Colonoscopy    . Wisdom tooth extraction    . Dilation and curettage of uterus  1988    endometriosis  . Abdominal hysterectomy N/A 04/20/2014    Procedure: Total ABDOMINAL HYSTERECTOMY partial right salpingectomy;  Surgeon: Dana Manges, MD;  Location: [facility];  Service: Gynecology;  Laterality: N/A;  . Lysis of adhesion N/A 04/20/2014    Procedure: LYSIS OF ADHESION;  Surgeon: [place] Manges, MD;  Location: [facility];  Service: Gynecology;  Laterality: N/A;   Family History: Family History  Problem Relation Age of Onset  . Hypertension Mother   . Cancer Mother     uterine  . Cancer Father   . Cancer Brother   . Early death Brother   . Stroke Maternal Uncle   . Heart disease Maternal Uncle   . Angioedema Neg Hx   . Asthma Neg Hx   . Eczema Neg Hx   . Immunodeficiency Neg Hx   . Urticaria Neg Hx   . Food Allergy Daughter   . Allergic rhinitis Daughter   . Allergic rhinitis Daughter    Social History: Social History  . Marital Status: Married    Spouse Name: N/A  . Number of Children: N/A  . Years of Education: N/A   Social History Main Topics  . Smoking status: Never Smoker   . Smokeless tobacco: Never Used  . Alcohol Use: 0.0 oz/week    0 Standard drinks or equivalent per week     Comment: socially  . Drug Use: No  . 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  Dana Vasquez has a current medication list which includes the following prescription(s): albuterol, levothyroxine, losartan, onetouch delica lancets 0000000, triamcinolone, albuterol, beclomethasone, cyclobenzaprine, fexofenadine, and hydrocortisone.   Drug Allergies: Allergies  Allergen Reactions  . Nitrofurantoin Monohyd Macro Other (See Comments)    Pt stated she gets a bad yeast infection   Environmental History: Dana Vasquez lives in a 49 year old house for 20 years  with carpet floors, with central heat and air; stuffed mattress, non-feather pillow/comforter without humidifier, pets or smokers.   Review of Systems  Constitutional: Negative for fever, weight loss and malaise/fatigue.  HENT: Positive for congestion. Negative for ear pain, hearing loss, nosebleeds and sore throat.   Eyes: Negative for blurred vision, double vision, pain, discharge and redness.  Respiratory: Positive for cough. Negative for shortness of breath.        Denies history of pneumonia.  Uses CPAP.  Gastrointestinal: Negative for heartburn, nausea, vomiting, abdominal pain, diarrhea and constipation.  Genitourinary: Negative.   Musculoskeletal: Negative for myalgias and joint pain.  Skin: Negative.  Negative for itching and rash.  Neurological: Positive for headaches. Negative for dizziness, seizures and weakness.  Endo/Heme/Allergies: Positive for environmental allergies.       Denies sensitivity to aspirin, NSAIDs, stinging insects, foods, latex and cosmetics.  Avoids costume jewelry tolerates nickel free.     Immunological: No chronic or recurring infections. Objective:   Filed Vitals:   04/25/16 1004  BP: 130/90  Pulse: 76  Temp: 98 F (36.7 C)  Resp: 16   SpO2 Readings from Last 1 Encounters:  04/25/16 99%   Physical Exam  Constitutional: She is well-developed, well-nourished, and in no distress.  HENT:  Head: Atraumatic.  Right Ear: Tympanic membrane and ear canal normal.  Left Ear: Tympanic membrane and ear canal normal.  Nose: Mucosal edema present. No rhinorrhea. No epistaxis.  Mouth/Throat: Oropharynx is clear and moist and mucous membranes are normal. No oropharyngeal exudate, posterior oropharyngeal edema or posterior oropharyngeal erythema.  Eyes: Conjunctivae are normal.  Neck: Neck supple.  Cardiovascular: Normal rate, S1 normal and S2 normal.   No murmur heard. Pulmonary/Chest: Effort normal. She has no wheezes. She has no rhonchi. She has no  rales.  Post Xopenex/Atrovent neb: Continues to be clear.  Patient without wheeze, rhonchi or crackles.  Abdominal: Soft. Normal appearance and bowel sounds are normal.  Musculoskeletal: She exhibits no edema.  Lymphadenopathy:    She has no cervical adenopathy.  Neurological: She is alert.  Skin: Skin is warm and intact. No rash noted. No cyanosis. Nails show no clubbing.    Diagnostics: Spirometry:  FVC  1.86--68%, FEV1 1.65--- 73%, FEF 25-75% 2.07--72%; postbronchodilator  FVC 1.94--71%, FEV1 1.80--79%, FEF 25-75% 2.85--99%.  (See scanned image).    Skin testing:  Deferred today.    Mikyle Sox M. Ishmael Holter, MD   cc: [name], MD

## 2016-04-25 NOTE — Patient Instructions (Signed)
Take Home Sheet  1. Avoidance:  As discussed.   2. Antihistamine: Allegra 180mg  by mouth once daily for runny nose or itching.   3. Nasal Spray: Nasacort AQ 1-2 spray(s) each nostril once daily for stuffy nose or drainage.    4. Inhalers:  Rescue: ProAir 2 puffs every 4 hours as needed for cough or wheeze.       -May use 2 puffs 10-20 minutes prior to exercise.   Preventative: QVAR 61mcg 2  puffs twice daily (Rinse, gargle, and spit out after use).   5. Eye Drops: Zaditor one  drop(s) each eye twcie daily for itchy eyes as needed.   6. Nasal Saline wash each evening at shower time.   7. Follow up Visit: for skin testing off antihistamines 72 hours prior to appointment.   Websites that have reliable Patient information: 1. American Academy of Asthma, Allergy, & Immunology: www.aaaai.org 2. Food Allergy Network: www.foodallergy.org 3. Mothers of Asthmatics: www.aanma.org 4. [place]: DiningCalendar.de 5. American College of Allergy, Asthma, & Immunology: https://robertson.info/ or www.acaai.org

## 2016-04-28 ENCOUNTER — Encounter: Payer: Self-pay | Admitting: Allergy and Immunology

## 2016-04-28 DIAGNOSIS — N182 Chronic kidney disease, stage 2 (mild): Secondary | ICD-10-CM | POA: Insufficient documentation

## 2016-05-23 ENCOUNTER — Ambulatory Visit: Payer: BLUE CROSS/BLUE SHIELD | Admitting: Allergy and Immunology

## 2016-06-07 ENCOUNTER — Ambulatory Visit (INDEPENDENT_AMBULATORY_CARE_PROVIDER_SITE_OTHER): Payer: BLUE CROSS/BLUE SHIELD | Admitting: Allergy

## 2016-06-07 ENCOUNTER — Encounter: Payer: Self-pay | Admitting: Allergy

## 2016-06-07 VITALS — BP 122/84 | HR 68 | Temp 98.5°F | Resp 16 | Ht 64.57 in | Wt 263.8 lb

## 2016-06-07 DIAGNOSIS — J301 Allergic rhinitis due to pollen: Secondary | ICD-10-CM | POA: Diagnosis not present

## 2016-06-07 DIAGNOSIS — J4530 Mild persistent asthma, uncomplicated: Secondary | ICD-10-CM | POA: Diagnosis not present

## 2016-06-07 DIAGNOSIS — J45901 Unspecified asthma with (acute) exacerbation: Secondary | ICD-10-CM | POA: Insufficient documentation

## 2016-06-07 DIAGNOSIS — J3089 Other allergic rhinitis: Secondary | ICD-10-CM | POA: Insufficient documentation

## 2016-06-07 NOTE — Patient Instructions (Addendum)
1. Allergic rhinitis due to pollen Allergy testing positive today for tree, weed, Dust mites and cockroach allergens which are seasonal and year-round allergens.  Continue Allegra 180mg  as needed for symptoms Continue Nasacort 1-2 spray as needed for nasal symptoms.  Continue Zaditor 1 drop each eye twice a day as needed for eye symptoms.    Consider dust mite pillow case cover to decrease dust mite exposure.  Wash bedding in hot water weekly.    2. Asthma, mild persistent Improved with initiation of Qvar.  Continue Qvar 2puff daily as you have been using.  If symptoms worsen (as below) increase to 2puff twice daily.  Continue albuterol as needed Asthma control goals:   Full participation in all desired activities (may need albuterol before activity)  Albuterol use two time or less a week on average (not counting use with activity)  Cough interfering with sleep two time or less a month  Oral steroids no more than once a year  No hospitalizations  Follow-up 51mo

## 2016-06-07 NOTE — Progress Notes (Signed)
Follow-up Note  RE: Dana Vasquez MRN: [id number] DOB: [birth year] Date of Office Visit: 06/07/2016   History of present illness: Dana Vasquez is a 50 y.o. female presenting today for skin testing and follow-up of Cough and wheeze, chronic rhinitis.  She was last seen by Dr. Ishmael Holter in June 2017.    She was started on Qvar after last visit.  She has been taking 2 puffs daily and feels that has helped significantly.  She feels like her airway is "more open".  Cough is much improved now it is periodic if she does activity mostly.  Proair use since last 1-2 times a week now since starting on Qvar.    With her rhinitis she uses her nasal spray for about a week after last visit with improvement in symptoms.  She hasn't needed to use in past 2 weeks.  Will take allegra at night and use nasal spray in the morning if she is feeling symptomatic.      Review of systems: Review of Systems  Constitutional: Negative for chills and fever.  HENT: Negative for sore throat.   Eyes: Negative for blurred vision and redness.  Gastrointestinal: Negative for diarrhea, nausea and vomiting.  Skin: Negative for rash.  Neurological: Positive for headaches (occipital).    All other systems negative unless noted above in HPI  Past medical/social/surgical/family history have been reviewed and are unchanged unless specifically indicated below.  No changes  Medication List:   Medication List       Accurate as of 06/07/16  5:08 PM. Always use your most recent med list.          albuterol 108 (90 Base) MCG/ACT inhaler Commonly known as:  PROAIR HFA Inhale 2 puffs into the lungs every 4 (four) hours as needed for wheezing or shortness of breath.   beclomethasone 80 MCG/ACT inhaler Commonly known as:  QVAR Inhale 2 puffs into the lungs 2 (two) times daily. Rinse, Gargle and Spit after each use.   cyclobenzaprine 5 MG tablet Commonly known as:  FLEXERIL Take 5 mg by mouth. Reported on  04/25/2016   fexofenadine 180 MG tablet Commonly known as:  ALLEGRA Take 1 tablet (180 mg total) by mouth daily.   hydrocortisone 25 MG suppository Commonly known as:  ANUSOL-HC Place 25 mg rectally. Reported on 04/25/2016   levothyroxine 75 MCG tablet Commonly known as:  SYNTHROID, LEVOTHROID Take 75 mcg by mouth daily before breakfast.   losartan 25 MG tablet Commonly known as:  COZAAR Take 25 mg by mouth daily.   ONETOUCH DELICA LANCETS [id number] Misc   triamcinolone 55 MCG/ACT Aero nasal inhaler Commonly known as:  NASACORT Place into the nose.       Known medication allergies: Allergies  Allergen Reactions  . Nitrofurantoin Monohyd Macro Other (See Comments)    Pt stated she gets a bad yeast infection     Physical examination: Blood pressure 122/84, pulse 68, temperature 98.5 F (36.9 C), temperature source Oral, resp. rate 16, height 5' 4.57" (1.64 m), weight 263 lb 12.8 oz (119.7 kg), last menstrual period 04/07/2014, SpO2 98 %.  General: Alert, interactive, in no acute distress. HEENT: TMs pearly gray, turbinates mildly edematous without discharge, post-pharynx non erythematous. Neck: Supple without lymphadenopathy. Lungs: Clear to auscultation without wheezing, rhonchi or rales. {no increased work of breathing. CV: Normal S1, S2 without murmurs. Abdomen: Nondistended, nontender. Skin: Warm and dry, without lesions or rashes. Extremities:  No clubbing, cyanosis or edema. Neuro:  Grossly intact.  Diagnositics/Labs:  Spirometry: FEV1: 83%, FVC: 81%, ratio consistent with non-obstructive pattern.  Normal spirometry  Allergy testing:  Positive for lambs quarter, ash, dust mite (d. Far and d. Pter), cockroach.   Allergy testing results were read and interpreted by provider, documented by clinical staff.   Assessment and plan:   1. Allergic rhinitis due to pollen Allergy testing positive today for tree, weed, Dust mites and cockroach allergens which are seasonal  and year-round allergens.  Continue Allegra 180mg  as needed for symptoms Continue Nasacort 1-2 spray as needed for nasal symptoms.  Continue Zaditor 1 drop each eye twice a day as needed for eye symptoms.    Consider dust mite pillow case cover to decrease dust mite exposure.  Wash bedding in hot water weekly.    2. Asthma with acute exacerbation, mild persistent Improved with initiation of Qvar.  Continue Qvar 2puff daily as you have been using.  If symptoms worsen (as below) increase to 2puff twice daily.  Continue albuterol as needed Asthma control goals:   Full participation in all desired activities (may need albuterol before activity)  Albuterol use two time or less a week on average (not counting use with activity)  Cough interfering with sleep two time or less a month  Oral steroids no more than once a year  No hospitalizations  Follow-up 11mo   I appreciate the opportunity to take part in Dana Vasquez's care. Please do not hesitate to contact me with questions.  Sincerely,   Prudy Feeler, MD Allergy/Immunology [facility]

## 2016-06-26 ENCOUNTER — Ambulatory Visit: Payer: Self-pay | Admitting: Neurology

## 2016-07-29 ENCOUNTER — Other Ambulatory Visit: Payer: Self-pay | Admitting: Nurse Practitioner

## 2016-07-29 ENCOUNTER — Ambulatory Visit
Admission: RE | Admit: 2016-07-29 | Discharge: 2016-07-29 | Disposition: A | Discharge status: Home / Self Care | Payer: BLUE CROSS/BLUE SHIELD | Source: Ambulatory Visit | Attending: Nurse Practitioner | Admitting: Nurse Practitioner

## 2016-07-29 DIAGNOSIS — R05 Cough: Secondary | ICD-10-CM

## 2016-07-29 DIAGNOSIS — R053 Chronic cough: Secondary | ICD-10-CM

## 2016-08-15 ENCOUNTER — Ambulatory Visit (INDEPENDENT_AMBULATORY_CARE_PROVIDER_SITE_OTHER): Payer: BLUE CROSS/BLUE SHIELD | Admitting: Neurology

## 2016-08-15 ENCOUNTER — Encounter: Payer: Self-pay | Admitting: Neurology

## 2016-08-15 VITALS — BP 130/72 | HR 72 | Resp 20 | Ht 64.0 in | Wt 262.0 lb

## 2016-08-15 DIAGNOSIS — Z9989 Dependence on other enabling machines and devices: Secondary | ICD-10-CM

## 2016-08-15 DIAGNOSIS — G4733 Obstructive sleep apnea (adult) (pediatric): Secondary | ICD-10-CM

## 2016-08-15 NOTE — Progress Notes (Signed)
SLEEP MEDICINE CLINIC   Provider:  Larey Seat, M D  Referring Provider: Glendale Chard, MD Primary Care Physician:  [name], MD  Chief Complaint  Patient presents with  . Follow-up    been sick on cpap, sleeping better    HPI:  Dana Vasquez is a 50 y.o. female , seen here as a referral from Dr. Baird Cancer for a transfer of sleep medicine care.   [name] is a 50 year old African-American right-handed female who carries a past medical history of obesity, diabetes, fibromyalgia thyroidectomy, hysterectomy, and fallopian tube removal after an ectopic pregnancy. The patient was diagnosed in 2012 almost 5 years ago, with obstructive sleep apnea in the sleep [facility] on [facility] Attending technician was Lane Hacker. She was diagnosed with moderate obstructive sleep apnea associated with snoring and hypoxia and was treated in the same night with CPAP ( SPLIT) , titrated to 11 cm water pressure. She was sent to [facility] for set up. She was placed on a medium sized Fisher-Paykel Zest mask.  She reports that she never had an M.D. follow-up, she actually never met the physician during the sleep study or afterwards. There was no pre-evaluation. Now more than 5 years after her initial sleep study she feels that her CPAP is no longer controlling her sleepiness. She received supplies by calling her phone number and they were mailed to her.  I reviewed Dr. Janese Banks next sleep study on the patient is a total sleep time of 308 minutes, about 20% REM sleep, her AHI was 24.3, her lowest oxygen level at night was 85%, she was noted to snore during the baseline part of her split night study. Her REM AHI was 47.8, at the time of the sleep study in 2012 she weighted 260 pounds.  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 Some days she will come home later  than others, but usually she works between 8 AM and 4 PM. She has no history of shift work. She is usually home in time for dinner, and she states that even during her workday she has begun feeling less alert less restored or refreshed. At times she felt dizzy or lightheaded. Her bedtime is usually around 9 PM plus minus one hour. And in bed she usually can sleep promptly as long as she has her CPAP. She feels no longer that she can sleep without CPAP. When she first was started on CPAP she would sleep all night without even turning and with no breaks or arousals. Now she wakes up several times at night, sometimes with palpitations, she also has more frequent bad dreams- vivid dreams ,she has been  screaming loudly and her husband has been woken by this too. Her bedroom is described as cool, quiet and dark. She prefers supine or left lateral sleep and usually uses 2 pillows. She doesn't usually not have to go to the bathroom at night, she does not wake up sweaty but  with headaches. She has been woken by sharp stabbing headaches and has sometimes been waking up in the morning with morning headaches that are more generalized global. These are episodic cluster headaches and hypoxia headaches. At least once a day she has the irresistible urge to go to sleep but cannot follow her urge. She reports that if she has the opportunity, is at rest, physically not active and mentally not  stimulated, she feels sleepy. On a weekend if she is at home she can nap, between 1 and 2 hours in duration. She feels more refreshed after a nap, she does not wake up with the same headaches, and usually seems not to have the same vivid dreams. Her headaches have affected her vision, and she has risk factors for pseudotumor cerebri. Diabetes may play a role- ophthalmology exam was at [place], [place].   Sleep medical history and family sleep history:  Morbid obesity, no tonsillectomy, no TBI, diabetes- well controlled, HTN. Surgical thyroid  removal in 1995. Social history:  Married, Pharmacist, hospital , Armed forces operational officer ( flooring ), 3 children, she does not drink alcohol, she does not use tobacco products, she drinks decaffeinated coffee and tea, nor sodas.  Interval history from 08/15/2016. I have the pleasure of seeing Dana Vasquez [birth year] , Following a split-night polysomnography from 04/14/2016. The patient suffers from a mild obstructive sleep apnea she slept all time of the baseline part of the study in supine AHI was 8.4, average heart rate was 70 without irregular heartbeats. She did have a sleep latency of only 3 minutes, and she was titrated during the study was only 7 cm water which reduced her AHI to 0.7 in the meantime the patient reports that she felt she had recurrent bronchitis while using CPAP and also some asthmatic symptoms. She is concerned that this may be related to the use of the machine perpetuating an infection. She had once had to reschedule her sleep study because of having a flu but she has had bronchitis since using CPAP. After for series of antibiotics she was still not cured. He received steroids for the asthmatic component as well. In spite of all this she has been a very compliant CPAP user with an 87% compliance rate over the last 30 days and residual AHI of 0.7. She has minimal air leaks and the 91st percentile pressure is 9.9 cm she's using an AutoSet between 5 and 10 cm water with 3 cm EPR no changes to the settings have to be made that the patient may benefit from adjusting the humidifier. She has changed the interface. [facility]- advised to use vinegar water.   Review of Systems: Out of a complete 14 system review, the patient complains of only the following symptoms, and all other reviewed systems are negative. Epworth score 12 , Fatigue severity score 46  , depression score 3/15 She endorsed wheezing, aching muscles, fatigue, weight gain, blurring of vision, lightheadedness, easy bruising, dizziness,  sleepiness, she had thyroid cancer in 1995.   Social History   Social History  . Marital status: Married    Spouse name: N/A  . Number of children: N/A  . Years of education: N/A   Occupational History  . Not on file.   Social History Main Topics  . Smoking status: Never Smoker  . Smokeless tobacco: Never Used  . Alcohol use 0.0 oz/week     Comment: socially  . Drug use: No  . Sexual activity: Yes    Birth control/ protection: Surgical   Other Topics Concern  . Not on file   Social History Narrative  . No narrative on file    Family History  Problem Relation Age of Onset  . Hypertension Mother   . Cancer Mother     uterine  . Cancer Father   . Cancer Brother   . Early death Brother   . Stroke Maternal Uncle   . Heart disease Maternal Uncle   .  Food Allergy Daughter   . Allergic rhinitis Daughter   . Allergic rhinitis Daughter   . Angioedema Neg Hx   . Asthma Neg Hx   . Eczema Neg Hx   . Immunodeficiency Neg Hx   . Urticaria Neg Hx     Past Medical History:  Diagnosis Date  . Allergy   . Anemia   . Anxiety    no meds  . Arthritis    knees - otc med prn  . Asthma    allergy related - rarely uses inhaler  . Bruises easily   . Cancer ([place])    1995  . Depression    no meds  . Diabetes mellitus without complication (HCC)    borderline - no meds  . Euthyroid   . Fatigue   . Fibromyalgia   . GERD (gastroesophageal reflux disease)   . Headache(784.0)    otc med prn - last one 02/2014  . History of blood transfusion 2000   In [place], [place] at [facility] - ? 4 units transfused  . Hypertension   . Hypothyroidism   . Knee pain   . Sleep apnea    uses CPAP  . SVD (spontaneous vaginal delivery)    x 3  . Thyroid disease    thyroidectomy 1995    Past Surgical History:  Procedure Laterality Date  . ABDOMINAL HYSTERECTOMY N/A 04/20/2014   Procedure: Total ABDOMINAL HYSTERECTOMY partial right salpingectomy;  Surgeon: Eldred Manges, MD;  Location:  [facility];  Service: Gynecology;  Laterality: N/A;  . BREATH TEK H PYLORI  09/18/2011   Procedure: BREATH TEK H PYLORI;  Surgeon: Pedro Earls, MD;  Location: [facility];  Service: General;  Laterality: N/A;  . COLONOSCOPY    . DILATION AND CURETTAGE OF UTERUS  1988   endometriosis  . fallopian tubes removed     left tube removed per patient - laparotomy  . LYSIS OF ADHESION N/A 04/20/2014   Procedure: LYSIS OF ADHESION;  Surgeon: Eldred Manges, MD;  Location: [facility];  Service: Gynecology;  Laterality: N/A;  . THYROIDECTOMY    . tubal ligation    . TUBAL LIGATION    . WISDOM TOOTH EXTRACTION      Current Outpatient Prescriptions  Medication Sig Dispense Refill  . albuterol (PROAIR HFA) 108 (90 Base) MCG/ACT inhaler Inhale 2 puffs into the lungs every 4 (four) hours as needed for wheezing or shortness of breath. 1 Inhaler 2  . beclomethasone (QVAR) 80 MCG/ACT inhaler Inhale 2 puffs into the lungs 2 (two) times daily. Rinse, Gargle and Spit after each use. 1 Inhaler 5  . cyclobenzaprine (FLEXERIL) 5 MG tablet Take 5 mg by mouth 3 (three) times daily as needed. Reported on 04/25/2016    . fexofenadine (ALLEGRA) 180 MG tablet Take 1 tablet (180 mg total) by mouth daily. 30 tablet 5  . hydrocortisone (ANUSOL-HC) 25 MG suppository Place 25 mg rectally. Reported on 04/25/2016    . levothyroxine (SYNTHROID, LEVOTHROID) 75 MCG tablet Take 75 mcg by mouth daily before breakfast.    . losartan (COZAAR) 25 MG tablet Take 25 mg by mouth daily.    Glory Rosebush DELICA LANCETS 19Q MISC     . triamcinolone (NASACORT) 55 MCG/ACT AERO nasal inhaler Place into the nose.     No current facility-administered medications for this visit.     Allergies as of 08/15/2016 - Review Complete 08/15/2016  Allergen Reaction Noted  . Nitrofurantoin monohyd macro Other (See Comments) 02/12/2012  Vitals: BP 130/72   Pulse 72   Resp 20   Ht 5' 4" (1.626 m)   Wt 262 lb (118.8 kg)   LMP 04/07/2014   BMI  44.97 kg/m  Last Weight:  Wt Readings from Last 1 Encounters:  08/15/16 262 lb (118.8 kg)   DHW:YSHU mass index is 44.97 kg/m.     Last Height:   Ht Readings from Last 1 Encounters:  08/15/16 5' 4" (1.626 m)    Physical exam:  General: The patient is awake, alert and appears not in acute distress. The patient is well groomed. Head: Normocephalic, atraumatic. Neck is supple. Mallampati 5 -the uvula is not visible, the patient has mild retrognathia and a larger tongue all contributing to a more narrow upper airway. Anoxic since was 15.5 inches , her nasal airflow is unrestricted. She does not have TMJ pain or clicking.  nCardiovascular:  Regular rate and rhythm , without  murmurs or carotid bruit, and without distended neck veins. Respiratory: Lungs are clear to auscultation. Skin:  Without evidence of edema, or rash Trunk: BMI is morbidly obese   Neurologic exam : The patient is awake and alert, oriented to place and time.   Memory subjective described as intact. Attention span & concentration ability appears normal.  Speech is fluent,  without dysarthria, dysphonia or aphasia.  Mood and affect are appropriate.  Cranial nerves: She has been starting to crave more salt, but she reports no different taste or smell sensation. Pupils are equal and briskly reactive to light. Funduscopic exam without evidence of pallor or edema.  Extraocular movements  in vertical and horizontal planes intact and without nystagmus. Visual fields by finger perimetry are intact. Hearing to finger rub intact.  Facial sensation intact to fine touch.Facial motor strength is symmetric and tongue and uvula move midline. Shoulder shrug was symmetrical.   upper and lower extremities are symmetric and intact. The patient was advised of the nature of the diagnosed sleep disorder , the treatment options and risks for general a health and wellness arising from not treating the condition.  I spent more than 45 minutes of  face to face time with the patient. Greater than 50% of time was spent in counseling and coordination of care. We have discussed the diagnosis and differential and I answered the patient's questions.     Assessment:   Dear Bailey Mech, Thank you very much for sending this very pleasant lady my way, I think there are several indicators that her apnea may still be controlled but her sleep is yet fragmented and lacks the same quality it used to have. Especially the episodic cluster headaches in the morning headaches but is concerned for idiopathic intracranial hypertension , hypercapnia and hypoxia. She was diagnsoed with a mild OSA and responded w very well to only 10 cm water CPAP. She complaints of Asthma. Bronchitis.  For this reason we discussed today how to keep the machine as hygienic as possible and to make sure that she does not introduce pathogens into her airway  After physical and neurologic examination, review of laboratory studies,  Personal review of imaging studies, reports of other /same  Imaging studies ,  Results of polysomnography/ neurophysiology testing and pre-existing records as far as provided in visit., my assessment is   Mild OSA with excellent CPAP compliance treated at only 9.9 cm water pressure on an AutoSet, nasal mask, air sense machine by [facility]. The durable medical equipment company is able care and they will regularly supply  her with new parts filter tubing etc. the settings on the machine do not have to be changed, her daytime sleepiness score is reduced, her Epworth was 11, she still is very fatigued 52 points on her fatigue severity score. I believe this has to do with her airway infection and asthma and she probably has to cough. Which is think it is safe for her to use a stronger cough suppressant at night?  Rv in 12 month    Plan:  Treatment plan and additional workup :    Larey Seat MD  08/15/2016   CC: Glendale Chard, [place]  [place], [place] [address]

## 2016-08-15 NOTE — Patient Instructions (Signed)

## 2016-09-26 ENCOUNTER — Encounter: Payer: Self-pay | Admitting: Neurology

## 2016-10-07 ENCOUNTER — Ambulatory Visit: Payer: BLUE CROSS/BLUE SHIELD | Admitting: Allergy

## 2016-10-11 ENCOUNTER — Ambulatory Visit: Payer: BLUE CROSS/BLUE SHIELD | Admitting: Allergy

## 2016-10-17 ENCOUNTER — Ambulatory Visit: Payer: BLUE CROSS/BLUE SHIELD | Admitting: Allergy

## 2016-10-25 ENCOUNTER — Ambulatory Visit: Payer: BLUE CROSS/BLUE SHIELD | Admitting: Allergy

## 2016-11-11 ENCOUNTER — Encounter: Payer: Self-pay | Admitting: Internal Medicine

## 2016-11-13 ENCOUNTER — Ambulatory Visit: Payer: BLUE CROSS/BLUE SHIELD | Admitting: Allergy

## 2016-12-12 ENCOUNTER — Other Ambulatory Visit: Payer: Self-pay | Admitting: *Deleted

## 2016-12-12 ENCOUNTER — Encounter: Payer: Self-pay | Admitting: Podiatry

## 2016-12-12 ENCOUNTER — Encounter: Payer: Self-pay | Admitting: *Deleted

## 2016-12-12 ENCOUNTER — Ambulatory Visit (INDEPENDENT_AMBULATORY_CARE_PROVIDER_SITE_OTHER): Payer: BLUE CROSS/BLUE SHIELD | Admitting: Podiatry

## 2016-12-12 ENCOUNTER — Ambulatory Visit (INDEPENDENT_AMBULATORY_CARE_PROVIDER_SITE_OTHER): Payer: BC Managed Care – PPO

## 2016-12-12 VITALS — BP 131/74 | HR 76 | Resp 16

## 2016-12-12 DIAGNOSIS — M722 Plantar fascial fibromatosis: Secondary | ICD-10-CM | POA: Diagnosis not present

## 2016-12-12 DIAGNOSIS — I129 Hypertensive chronic kidney disease with stage 1 through stage 4 chronic kidney disease, or unspecified chronic kidney disease: Secondary | ICD-10-CM | POA: Insufficient documentation

## 2016-12-12 DIAGNOSIS — Z79899 Other long term (current) drug therapy: Secondary | ICD-10-CM | POA: Insufficient documentation

## 2016-12-12 DIAGNOSIS — N08 Glomerular disorders in diseases classified elsewhere: Secondary | ICD-10-CM | POA: Insufficient documentation

## 2016-12-12 DIAGNOSIS — Z0000 Encounter for general adult medical examination without abnormal findings: Secondary | ICD-10-CM | POA: Insufficient documentation

## 2016-12-12 MED ORDER — MELOXICAM 15 MG PO TABS: 15.0000 mg | ORAL_TABLET | Freq: Every day | ORAL | 3 refills | Status: DC

## 2016-12-12 NOTE — Progress Notes (Signed)
   Subjective:    Patient ID: Dana Vasquez, female    DOB: 1966-07-30, 51 y.o.   MRN: [id number]  HPI: She presents today with a 10 year history of pain to her right plantar heel. She states it is been on and off for that time. She also relates that she had to have Topaz surgery on the left heel when she had her bunion correction many years ago. She states that she was in a boot for 6 months. States that she has recently been diagnosed as diabetic with a hemoglobin A1c was 6.7 most recently takes no medications.    Review of Systems  All other systems reviewed and are negative.      Objective:   Physical Exam: I have reviewed her past history medications allergies surgery social history and review of systems area pulses are strongly palpable. Neurologic sensorium is intact. Deep tendon reflexes are normal muscle strength bilaterally symmetrical and strong. Orthopedic evaluation demonstrates mild hallux valgus deformity of the right foot with pain on palpation medial calcaneal tubercle of the right heel. Left foot demonstrates a scar overlying the first metatarsophalangeal joint where bunion was corrected. No open lesions or wounds are noted.        Assessment & Plan:  Assessment: Plantar fasciitis right foot. Hallux abductovalgus deformity right foot.  Plan: Discussed etiology pathology conservative versus surgical therapies. At this point I injected the right heel today with Kenalog and local anesthetic placed her in a plantar fascia brace she has a night splint at home that she will continue to wear. We discussed appropriate shoe gear stretching exercises and ice therapy. Due to the diabetes and did not prescribed prednisone however I did prescribe meloxicam which she had taken in the past without complications. I will follow-up with her in 1 month

## 2016-12-12 NOTE — Patient Instructions (Signed)

## 2016-12-16 ENCOUNTER — Telehealth: Payer: Self-pay | Admitting: *Deleted

## 2016-12-16 NOTE — Telephone Encounter (Signed)
Pt states the Corflex she was fitted with is too small and she would like a larger one. Left message informing pt if the Corflex was too small we could refit her if she would bring the small one back in.

## 2017-01-07 ENCOUNTER — Ambulatory Visit (INDEPENDENT_AMBULATORY_CARE_PROVIDER_SITE_OTHER): Payer: BC Managed Care – PPO | Admitting: Podiatry

## 2017-01-07 DIAGNOSIS — M722 Plantar fascial fibromatosis: Secondary | ICD-10-CM | POA: Diagnosis not present

## 2017-01-07 NOTE — Progress Notes (Signed)
She presents today for follow-up of her plantar fasciitis. She states that she is approximately 30% improvement continues to take her anti-inflammatories and utilize her splints. She has orthotics at home that she will bring with her next visit.  Objective: Vital signs are stable alert and oriented 3. Pulses are palpable. She has pain on palpation medially continue tubercle of the right foot. No calf pain.  Assessment: Pain in limb secondary to plantar fasciitis right foot.  Plan: Reinjected the area today with Kenalog and local anesthetic continue all conservative therapies including pressure brace and night splint. Follow-up with me in 1 month.

## 2017-01-09 ENCOUNTER — Encounter (HOSPITAL_COMMUNITY): Payer: Self-pay | Admitting: Emergency Medicine

## 2017-01-09 ENCOUNTER — Ambulatory Visit (INDEPENDENT_AMBULATORY_CARE_PROVIDER_SITE_OTHER): Payer: Worker's Compensation

## 2017-01-09 ENCOUNTER — Ambulatory Visit (HOSPITAL_COMMUNITY)
Admission: EM | Admit: 2017-01-09 | Discharge: 2017-01-09 | Disposition: A | Discharge status: Home / Self Care | Payer: Worker's Compensation | Attending: Family Medicine | Admitting: Family Medicine

## 2017-01-09 DIAGNOSIS — S5002XA Contusion of left elbow, initial encounter: Secondary | ICD-10-CM | POA: Diagnosis not present

## 2017-01-09 DIAGNOSIS — W19XXXA Unspecified fall, initial encounter: Secondary | ICD-10-CM | POA: Diagnosis not present

## 2017-01-09 DIAGNOSIS — M542 Cervicalgia: Secondary | ICD-10-CM | POA: Diagnosis not present

## 2017-01-09 DIAGNOSIS — M25512 Pain in left shoulder: Secondary | ICD-10-CM | POA: Diagnosis not present

## 2017-01-09 MED ORDER — KETOROLAC TROMETHAMINE 60 MG/2ML IM SOLN
INTRAMUSCULAR | Status: AC
  Filled 2017-01-09: qty 2

## 2017-01-09 MED ORDER — KETOROLAC TROMETHAMINE 60 MG/2ML IM SOLN
60.0000 mg | Freq: Once | INTRAMUSCULAR | Status: AC
  Administered 2017-01-09: 60 mg via INTRAMUSCULAR

## 2017-01-09 NOTE — Discharge Instructions (Signed)
Call Occupational Health for workman's comp visit and follow up.  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  Recommend taking meloxicam and flexeril from home.  No fracture of elbow is seen.

## 2017-01-09 NOTE — ED Provider Notes (Signed)
CSN: [id number]     Arrival date & time 01/09/17  1632 History   First MD Initiated Contact with Patient 01/09/17 1737     Chief Complaint  Patient presents with  . Fall   (Consider location/radiation/quality/duration/timing/severity/associated sxs/prior Treatment) Patient c/o neck discomfort, left elbow discomfort, and left elbow discomfort.  She fell today at school.  She is a Education officer, museum.   The history is provided by the patient.  Fall  This is a new problem. The current episode started 12 to 24 hours ago. The problem occurs constantly. The problem has not changed since onset.Nothing aggravates the symptoms. Nothing relieves the symptoms. She has tried nothing for the symptoms.    Past Medical History:  Diagnosis Date  . Allergy   . Anemia   . Anxiety    no meds  . Arthritis    knees - otc med prn  . Asthma    allergy related - rarely uses inhaler  . Bruises easily   . Cancer ([place])    1995  . Depression    no meds  . Diabetes mellitus without complication (HCC)    borderline - no meds  . Euthyroid   . Fatigue   . Fibromyalgia   . GERD (gastroesophageal reflux disease)   . Headache(784.0)    otc med prn - last one 02/2014  . History of blood transfusion 2000   In [place], [place] at [place] - ? 4 units transfused  . Hypertension   . Hypothyroidism   . Knee pain   . Sleep apnea    uses CPAP  . SVD (spontaneous vaginal delivery)    x 3  . Thyroid disease    thyroidectomy 1995   Past Surgical History:  Procedure Laterality Date  . ABDOMINAL HYSTERECTOMY N/A 04/20/2014   Procedure: Total ABDOMINAL HYSTERECTOMY partial right salpingectomy;  Surgeon: Eldred Manges, MD;  Location: [facility];  Service: Gynecology;  Laterality: N/A;  . BREATH TEK H PYLORI  09/18/2011   Procedure: BREATH TEK H PYLORI;  Surgeon: Pedro Earls, MD;  Location: [facility];  Service: General;  Laterality: N/A;  . COLONOSCOPY    . DILATION AND CURETTAGE OF UTERUS  1988   endometriosis   . fallopian tubes removed     left tube removed per patient - laparotomy  . LYSIS OF ADHESION N/A 04/20/2014   Procedure: LYSIS OF ADHESION;  Surgeon: Eldred Manges, MD;  Location: [facility];  Service: Gynecology;  Laterality: N/A;  . THYROIDECTOMY    . tubal ligation    . TUBAL LIGATION    . WISDOM TOOTH EXTRACTION     Family History  Problem Relation Age of Onset  . Hypertension Mother   . Cancer Mother     uterine  . Cancer Father   . Cancer Brother   . Early death Brother   . Stroke Maternal Uncle   . Heart disease Maternal Uncle   . Food Allergy Daughter   . Allergic rhinitis Daughter   . Allergic rhinitis Daughter   . Angioedema Neg Hx   . Asthma Neg Hx   . Eczema Neg Hx   . Immunodeficiency Neg Hx   . Urticaria Neg Hx    Social History  Substance Use Topics  . Smoking status: Never Smoker  . Smokeless tobacco: Never Used  . Alcohol use 0.0 oz/week     Comment: socially   OB History    No data available     Review of  Systems  Constitutional: Negative.   HENT: Negative.   Eyes: Negative.   Respiratory: Negative.   Cardiovascular: Negative.   Gastrointestinal: Negative.   Endocrine: Negative.   Genitourinary: Negative.   Musculoskeletal: Positive for arthralgias.  Skin: Negative.   Allergic/Immunologic: Negative.   Neurological: Negative.   Hematological: Negative.   Psychiatric/Behavioral: Negative.     Allergies  Nitrofurantoin monohyd macro  Home Medications   Prior to Admission medications   Medication Sig Start Date End Date Taking? Authorizing Provider  levothyroxine (SYNTHROID, LEVOTHROID) 75 MCG tablet Take 75 mcg by mouth daily before breakfast.   Yes Historical Provider, MD  losartan (COZAAR) 25 MG tablet Take 25 mg by mouth daily.   Yes Historical Provider, MD  meloxicam (MOBIC) 15 MG tablet Take 1 tablet (15 mg total) by mouth daily. 12/12/16  Yes Max T Milinda Pointer, DPM   Meds Ordered and Administered this Visit   Medications  ketorolac  (TORADOL) injection 60 mg (not administered)    BP 126/78 (BP Location: Right Arm)   Pulse 73   Temp 98.9 F (37.2 C) (Oral)   LMP 04/07/2014   SpO2 100%  No data found.   Physical Exam  Constitutional: She is oriented to person, place, and time. She appears well-developed and well-nourished.  HENT:  Head: Normocephalic and atraumatic.  Right Ear: External ear normal.  Left Ear: External ear normal.  Mouth/Throat: Oropharynx is clear and moist.  Eyes: Conjunctivae and EOM are normal. Pupils are equal, round, and reactive to light.  Neck: Normal range of motion. Neck supple.  Cardiovascular: Normal rate, regular rhythm and normal heart sounds.   Pulmonary/Chest: Effort normal and breath sounds normal.  Abdominal: Soft.  Musculoskeletal: She exhibits tenderness.  FROM left shoulder.  Tenderness to left deltoid.  Tenderness left elbow.  No deformity of elbow or shoulder. TTP lower cervical paraspinous muscles.  FROM cervical spine.  Neurological: She is alert and oriented to person, place, and time.  Nursing note and vitals reviewed.   Urgent Care Course     Procedures (including critical care time)  Labs Review Labs Reviewed - No data to display  Imaging Review Dg Elbow Complete Left (3+view)  Result Date: 01/09/2017 CLINICAL DATA:  51 year old female with acute left elbow pain following fall today. Initial encounter. EXAM: LEFT ELBOW - COMPLETE 3+ VIEW COMPARISON:  None. FINDINGS: There is no evidence of fracture, dislocation, or joint effusion. There is no evidence of arthropathy or other focal bone abnormality. Soft tissues are unremarkable. IMPRESSION: Negative. Electronically Signed   By: Margarette Canada M.D.   On: 01/09/2017 17:19     Visual Acuity Review  Right Eye Distance:   Left Eye Distance:   Bilateral Distance:    Right Eye Near:   Left Eye Near:    Bilateral Near:         MDM   1. Fall, initial encounter   2. Acute pain of left shoulder   3.  Contusion of left elbow, initial encounter   4. Cervicalgia    Continue taking meloxicam and flexeril rx'd by orthopedics Toradol 60mg  IM Off work tomorrow and follow up with Occupational Health.      Lysbeth Penner, FNP 01/09/17 1750

## 2017-01-09 NOTE — ED Triage Notes (Signed)
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  She broke her fall with her left elbow and is having pain in her left elbow, shoulder, neck and mid back.

## 2017-01-17 ENCOUNTER — Telehealth: Payer: Self-pay | Admitting: Allergy and Immunology

## 2017-01-17 NOTE — Telephone Encounter (Signed)
Pt called and said that she needed a prior auth for Qvar called into [facility] on [address]. She made appointment for Tuesday April 3 with Dr Verlin Fester. [phone].

## 2017-01-20 ENCOUNTER — Other Ambulatory Visit: Payer: Self-pay

## 2017-01-20 MED ORDER — BECLOMETHASONE DIPROP HFA 80 MCG/ACT IN AERB: 2.0000 | INHALATION_SPRAY | Freq: Two times a day (BID) | RESPIRATORY_TRACT | 0 refills | Status: DC

## 2017-01-20 MED ORDER — FLUTICASONE PROPIONATE HFA 110 MCG/ACT IN AERO: 2.0000 | INHALATION_SPRAY | Freq: Two times a day (BID) | RESPIRATORY_TRACT | 0 refills | Status: DC

## 2017-01-20 NOTE — Telephone Encounter (Signed)
Left detailed message for patient with medication change instructions and advised to call with any questions.

## 2017-01-28 ENCOUNTER — Encounter: Payer: Self-pay | Admitting: Allergy and Immunology

## 2017-01-28 ENCOUNTER — Ambulatory Visit (INDEPENDENT_AMBULATORY_CARE_PROVIDER_SITE_OTHER): Payer: BC Managed Care – PPO | Admitting: Allergy and Immunology

## 2017-01-28 VITALS — BP 130/76 | HR 70 | Temp 98.2°F | Resp 16 | Ht 64.0 in | Wt 255.0 lb

## 2017-01-28 DIAGNOSIS — J3089 Other allergic rhinitis: Secondary | ICD-10-CM | POA: Diagnosis not present

## 2017-01-28 DIAGNOSIS — J45901 Unspecified asthma with (acute) exacerbation: Secondary | ICD-10-CM | POA: Diagnosis not present

## 2017-01-28 DIAGNOSIS — E088 Diabetes mellitus due to underlying condition with unspecified complications: Secondary | ICD-10-CM | POA: Diagnosis not present

## 2017-01-28 MED ORDER — PREDNISONE 1 MG PO TABS: 10.0000 mg | ORAL_TABLET | Freq: Every day | ORAL | Status: DC

## 2017-01-28 MED ORDER — AZELASTINE HCL 0.15 % NA SOLN: 1.0000 | Freq: Two times a day (BID) | NASAL | 3 refills | Status: DC | PRN

## 2017-01-28 MED ORDER — FLUTICASONE PROPIONATE HFA 110 MCG/ACT IN AERO: 2.0000 | INHALATION_SPRAY | Freq: Two times a day (BID) | RESPIRATORY_TRACT | 5 refills | Status: DC

## 2017-01-28 NOTE — Patient Instructions (Addendum)
Asthma with acute exacerbation  A prescription has been provided for Flovent 110 g, 2 inhalations twice a day. To maximize pulmonary deposition, a spacer has been provided along with instructions for its proper administration with an HFA inhaler.  Restart montelukast 10 mg daily bedtime.    To hasten symptom relief, prednisone has been provided, 20 mg x 4 days, 10 mg x1 day, then stop.  Continue albuterol HFA, 1-2 inhalations every 4-6 hours as needed.  The patient has been asked to contact me if her symptoms persist or progress. Otherwise, she may return for follow up in 4 months.  Allergic rhinitis  A prescription has been provided for azelastine nasal spray, 1-2 sprays per nostril 2 times daily as needed. Proper nasal spray technique has been discussed and demonstrated.   I have also recommended nasal saline spray (i.e., Simply Saline) or nasal saline lavage (i.e., NeilMed) as needed and prior to medicated nasal sprays.    Continue appropriate allergen avoidance measures and restart montelukast 10 mg daily.   Return in about 4 months (around 05/30/2017), or if symptoms worsen or fail to improve.

## 2017-01-28 NOTE — Assessment & Plan Note (Signed)
   A prescription has been provided for azelastine nasal spray, 1-2 sprays per nostril 2 times daily as needed. Proper nasal spray technique has been discussed and demonstrated.   I have also recommended nasal saline spray (i.e., Simply Saline) or nasal saline lavage (i.e., NeilMed) as needed and prior to medicated nasal sprays.    Continue appropriate allergen avoidance measures and restart montelukast 10 mg daily.

## 2017-01-28 NOTE — Progress Notes (Signed)
Follow-up Note  RE: Dana Vasquez MRN: [id number] DOB: 12-Dec-1965 Date of Office Visit: 01/28/2017  Primary care provider: [name], MD Referring provider: Glendale Chard, MD  History of present illness: Dana Vasquez is a 51 y.o. female with persistent asthma and allergic rhinitis presenting today for sick visit.  She was last seen in this clinic in August 2017 by Dr. Nelva Bush.  She reports that she had a "rough time" throughout when her regarding her asthma.  She apparently was on 3 rounds of antibiotics for asthmatic bronchitis which did not provide relief.  Eventually she was put on prednisone with symptom relief.  She believes that the lower respiratory symptoms may have been triggered by her CPAP machine which she apparently was not cleaning appropriately.  She states that she is still experiencing chest tightness and coughing.  She reports that she has not been taking montelukast because she did not know why she was taking it.  In addition, she is not taking inhaled corticosteroid because her prescription was switched from Qvar to Flovent and she did not know why.  She states that fluticasone nasal spray has not been beneficial in relieving her nasal allergy symptoms.  She has no reflux related complaints today.   Assessment and plan: Asthma with acute exacerbation  A prescription has been provided for Flovent 110 g, 2 inhalations twice a day. To maximize pulmonary deposition, a spacer has been provided along with instructions for its proper administration with an HFA inhaler.  Restart montelukast 10 mg daily bedtime.    To hasten symptom relief, prednisone has been provided, 20 mg x 4 days, 10 mg x1 day, then stop.  Continue albuterol HFA, 1-2 inhalations every 4-6 hours as needed.  The patient has been asked to contact me if her symptoms persist or progress. Otherwise, she may return for follow up in 4 months.  Allergic rhinitis  A prescription has been provided  for azelastine nasal spray, 1-2 sprays per nostril 2 times daily as needed. Proper nasal spray technique has been discussed and demonstrated.   I have also recommended nasal saline spray (i.e., Simply Saline) or nasal saline lavage (i.e., NeilMed) as needed and prior to medicated nasal sprays.    Continue appropriate allergen avoidance measures and restart montelukast 10 mg daily.   Meds ordered this encounter  Medications  . Azelastine HCl 0.15 % SOLN    Sig: Place 1-2 sprays into both nostrils 2 (two) times daily as needed.    Dispense:  30 mL    Refill:  3  . fluticasone (FLOVENT HFA) 110 MCG/ACT inhaler    Sig: Inhale 2 puffs into the lungs 2 (two) times daily.    Dispense:  1 Inhaler    Refill:  5  . predniSONE (DELTASONE) tablet 10 mg    Diagnostics: Spirometry reveals an FVC of 1.86 L and an FEV1 of 1.6 early liters (71% predicted) with 120 mL (7%) post bronchodilator improvement.  Please see scanned spirometry results for details.    Physical examination: Blood pressure 130/76, pulse 70, temperature 98.2 F (36.8 C), temperature source Oral, resp. rate 16, height 5\' 4"  (1.626 m), weight 255 lb (115.7 kg), last menstrual period 04/07/2014, SpO2 98 %.  General: Alert, interactive, in no acute distress. HEENT: TMs pearly gray, turbinates moderately edematous with clear discharge, post-pharynx mildly erythematous. Neck: Supple without lymphadenopathy. Lungs: Mildly decreased breath sounds bilaterally without wheezing, rhonchi or rales. CV: Normal S1, S2 without murmurs. Skin: Warm and dry,  without lesions or rashes.  The following portions of the patient's history were reviewed and updated as appropriate: allergies, current medications, past family history, past medical history, past social history, past surgical history and problem list.  Allergies as of 01/28/2017      Reactions   Nitrofurantoin Monohyd Macro Other (See Comments)   Pt stated she gets a bad yeast infection        Medication List       Accurate as of 01/28/17  1:35 PM. Always use your most recent med list.          Azelastine HCl 0.15 % Soln Place 1-2 sprays into both nostrils 2 (two) times daily as needed.   fluticasone 110 MCG/ACT inhaler Commonly known as:  FLOVENT HFA Inhale 2 puffs into the lungs 2 (two) times daily.   fluticasone 110 MCG/ACT inhaler Commonly known as:  FLOVENT HFA Inhale 2 puffs into the lungs 2 (two) times daily.   HYDROMET 5-1.5 MG/5ML syrup Generic drug:  HYDROcodone-homatropine TAKE 5 MLS BY MOUTH EVERY 4-6 HOURS AS NEEDED   levothyroxine 75 MCG tablet Commonly known as:  SYNTHROID, LEVOTHROID Take 75 mcg by mouth daily before breakfast.   losartan 25 MG tablet Commonly known as:  COZAAR Take 25 mg by mouth daily.   meloxicam 15 MG tablet Commonly known as:  MOBIC Take 1 tablet (15 mg total) by mouth daily.   montelukast 10 MG tablet Commonly known as:  SINGULAIR TK 1 T PO QPM       Allergies  Allergen Reactions  . Nitrofurantoin Monohyd Macro Other (See Comments)    Pt stated she gets a bad yeast infection   Review of systems: Review of systems negative except as noted in HPI / PMHx or noted below: Constitutional: Negative.  HENT: Negative.   Eyes: Negative.  Respiratory: Negative.   Cardiovascular: Negative.  Gastrointestinal: Negative.  Genitourinary: Negative.  Musculoskeletal: Negative.  Neurological: Negative.  Endo/Heme/Allergies: Negative.  Cutaneous: Negative.  Past Medical History:  Diagnosis Date  . Allergy   . Anemia   . Anxiety    no meds  . Arthritis    knees - otc med prn  . Asthma    allergy related - rarely uses inhaler  . Bruises easily   . Cancer ([place])    1995  . Depression    no meds  . Diabetes mellitus without complication (HCC)    borderline - no meds  . Euthyroid   . Fatigue   . Fibromyalgia   . GERD (gastroesophageal reflux disease)   . Headache(784.0)    otc med prn - last one 02/2014   . History of blood transfusion 2000   In [place], [place] at [place] - ? 4 units transfused  . Hypertension   . Hypothyroidism   . Knee pain   . Sleep apnea    uses CPAP  . SVD (spontaneous vaginal delivery)    x 3  . Thyroid disease    thyroidectomy 1995    Family History  Problem Relation Age of Onset  . Hypertension Mother   . Cancer Mother     uterine  . Cancer Father   . Cancer Brother   . Early death Brother   . Stroke Maternal Uncle   . Heart disease Maternal Uncle   . Food Allergy Daughter   . Allergic rhinitis Daughter   . Allergic rhinitis Daughter   . Angioedema Neg Hx   . Asthma Neg Hx   .  Eczema Neg Hx   . Immunodeficiency Neg Hx   . Urticaria Neg Hx     Social History   Social History  . Marital status: Married    Spouse name: N/A  . Number of children: N/A  . Years of education: N/A   Occupational History  . Not on file.   Social History Main Topics  . Smoking status: Never Smoker  . Smokeless tobacco: Never Used  . Alcohol use 0.0 oz/week     Comment: socially  . Drug use: No  . Sexual activity: Yes    Birth control/ protection: Surgical   Other Topics Concern  . Not on file   Social History Narrative  . No narrative on file    I appreciate the opportunity to take part in Kathryne's care. Please do not hesitate to contact me with questions.  Sincerely,   R. Edgar Frisk, MD

## 2017-01-28 NOTE — Assessment & Plan Note (Addendum)
   A prescription has been provided for Flovent 110 g, 2 inhalations twice a day. To maximize pulmonary deposition, a spacer has been provided along with instructions for its proper administration with an HFA inhaler.  Restart montelukast 10 mg daily bedtime.    To hasten symptom relief, prednisone has been provided, 20 mg x 4 days, 10 mg x1 day, then stop.  Continue albuterol HFA, 1-2 inhalations every 4-6 hours as needed.  The patient has been asked to contact me if her symptoms persist or progress. Otherwise, she may return for follow up in 4 months.

## 2017-02-12 ENCOUNTER — Ambulatory Visit (INDEPENDENT_AMBULATORY_CARE_PROVIDER_SITE_OTHER): Payer: BC Managed Care – PPO

## 2017-02-12 ENCOUNTER — Encounter: Payer: Self-pay | Admitting: Podiatry

## 2017-02-12 ENCOUNTER — Ambulatory Visit (INDEPENDENT_AMBULATORY_CARE_PROVIDER_SITE_OTHER): Payer: BC Managed Care – PPO | Admitting: Podiatry

## 2017-02-12 DIAGNOSIS — M722 Plantar fascial fibromatosis: Secondary | ICD-10-CM | POA: Diagnosis not present

## 2017-02-12 DIAGNOSIS — T148XXA Other injury of unspecified body region, initial encounter: Secondary | ICD-10-CM | POA: Diagnosis not present

## 2017-02-12 NOTE — Progress Notes (Signed)
Subjective:     Patient ID: Dana Vasquez, female   DOB: July 12, 1966, 51 y.o.   MRN: [id number]  HPI patient states she felt a pop in her right heel when she got out of the car and states that it's been very tender over the last few days. She felt her heel was improve his with previous treatment by Dr. Milinda Pointer symptoms are quite intense now   Review of Systems     Objective:   Physical Exam Neurovascular status intact negative Homan sign was noted with patient found to have exquisite discomfort distal to the fascial insertion into the right arch with no ecchymosis and mild edema noted    Assessment:     Probability for tear of the partial nature of the right plantar fashion due to long-standing nature of condition and mechanics walking    Plan:     H&P x-ray reviewed condition discussed. At this point I recommended aggressive ice therapy which I showed her how to do and dispensed ice packs and air fracture walker to immobilize. She is placed an air fracture walker and will reappoint 4 weeks for Dr. Milinda Pointer to review  X-rays were negative for signs of fracture and did indicate some soft tissue inflammation plantar

## 2017-03-13 ENCOUNTER — Ambulatory Visit: Payer: BC Managed Care – PPO | Admitting: Podiatry

## 2017-04-03 ENCOUNTER — Ambulatory Visit: Payer: BC Managed Care – PPO | Admitting: Podiatry

## 2017-04-15 ENCOUNTER — Encounter (INDEPENDENT_AMBULATORY_CARE_PROVIDER_SITE_OTHER): Payer: BC Managed Care – PPO | Admitting: Podiatry

## 2017-04-15 NOTE — Progress Notes (Signed)
This encounter was created in error - please disregard.

## 2017-04-24 ENCOUNTER — Other Ambulatory Visit: Payer: Self-pay | Admitting: Occupational Medicine

## 2017-04-24 ENCOUNTER — Ambulatory Visit: Payer: Worker's Compensation

## 2017-04-24 DIAGNOSIS — R0781 Pleurodynia: Secondary | ICD-10-CM

## 2017-05-22 ENCOUNTER — Encounter: Payer: Self-pay | Admitting: Podiatry

## 2017-05-22 ENCOUNTER — Ambulatory Visit (INDEPENDENT_AMBULATORY_CARE_PROVIDER_SITE_OTHER): Payer: BC Managed Care – PPO | Admitting: Podiatry

## 2017-05-22 DIAGNOSIS — M722 Plantar fascial fibromatosis: Secondary | ICD-10-CM

## 2017-05-23 NOTE — Progress Notes (Signed)
She presents today for follow-up of her right heel pain. She states it is much better than it was last time when I felt the pop in my foot. She states that it has improved considerably and she wore the boot for a little while.  Objective: Vital signs are stable she is alert and oriented 3. Pulses are palpable. She still has pain on palpation of the plantar fascia though it appears that it has ruptured since is no longer tight along the medial longitudinal arch. She still has pain distal to the insertion site.  Assessment: Fasciitis left plantar fascial rupture right foot.  Plan: I injected today with Kenalog and local anesthetic. I will follow-up with her in 1-2 months necessary.

## 2017-05-28 ENCOUNTER — Encounter: Payer: Self-pay | Admitting: Internal Medicine

## 2017-06-03 ENCOUNTER — Ambulatory Visit: Payer: BC Managed Care – PPO | Admitting: Allergy and Immunology

## 2017-07-03 ENCOUNTER — Ambulatory Visit: Payer: BC Managed Care – PPO | Admitting: Podiatry

## 2017-07-18 ENCOUNTER — Ambulatory Visit (AMBULATORY_SURGERY_CENTER): Payer: Self-pay

## 2017-07-18 VITALS — Ht 64.0 in | Wt 257.6 lb

## 2017-07-18 DIAGNOSIS — Z1211 Encounter for screening for malignant neoplasm of colon: Secondary | ICD-10-CM

## 2017-07-18 NOTE — Progress Notes (Signed)
No allergies to eggs or soy No diet meds No home oxygen No past problems with anesthesia  Declined emmi 

## 2017-07-22 ENCOUNTER — Encounter: Payer: Self-pay | Admitting: Internal Medicine

## 2017-08-01 ENCOUNTER — Ambulatory Visit (AMBULATORY_SURGERY_CENTER): Payer: BC Managed Care – PPO | Admitting: Internal Medicine

## 2017-08-01 ENCOUNTER — Encounter: Payer: Self-pay | Admitting: Internal Medicine

## 2017-08-01 VITALS — BP 132/77 | HR 74 | Temp 98.2°F | Resp 14 | Ht 64.0 in | Wt 257.0 lb

## 2017-08-01 DIAGNOSIS — Z1212 Encounter for screening for malignant neoplasm of rectum: Secondary | ICD-10-CM

## 2017-08-01 DIAGNOSIS — Z1211 Encounter for screening for malignant neoplasm of colon: Secondary | ICD-10-CM | POA: Diagnosis present

## 2017-08-01 MED ORDER — SODIUM CHLORIDE 0.9 % IV SOLN: 500.0000 mL | INTRAVENOUS | Status: DC

## 2017-08-01 NOTE — Patient Instructions (Addendum)
No polyps or cancer seen.  You do have diverticulosis - thickened muscle rings and pouches in the colon wall. Please read the handout about this condition.  Next routine colonoscopy or other screening test in 10 years - 2028  I appreciate the opportunity to care for you. Gatha Mayer, MD, FACG    YOU HAD AN ENDOSCOPIC PROCEDURE TODAY AT [facility]:   Refer to the procedure report that was given to you for any specific questions about what was found during the examination.  If the procedure report does not answer your questions, please call your gastroenterologist to clarify.  If you requested that your care partner not be given the details of your procedure findings, then the procedure report has been included in a sealed envelope for you to review at your convenience later.  YOU SHOULD EXPECT: Some feelings of bloating in the abdomen. Passage of more gas than usual.  Walking can help get rid of the air that was put into your GI tract during the procedure and reduce the bloating. If you had a lower endoscopy (such as a colonoscopy or flexible sigmoidoscopy) you may notice spotting of blood in your stool or on the toilet paper. If you underwent a bowel prep for your procedure, you may not have a normal bowel movement for a few days.  Please Note:  You might notice some irritation and congestion in your nose or some drainage.  This is from the oxygen used during your procedure.  There is no need for concern and it should clear up in a day or so.  SYMPTOMS TO REPORT IMMEDIATELY:   Following lower endoscopy (colonoscopy or flexible sigmoidoscopy):  Excessive amounts of blood in the stool  Significant tenderness or worsening of abdominal pains  Swelling of the abdomen that is new, acute  Fever of 100F or higher    For urgent or emergent issues, a gastroenterologist can be reached at any hour by calling [phone].   DIET:  We do recommend a small meal at  first, but then you may proceed to your regular diet.  Drink plenty of fluids but you should avoid alcoholic beverages for 24 hours.  ACTIVITY:  You should plan to take it easy for the rest of today and you should NOT DRIVE or use heavy machinery until tomorrow (because of the sedation medicines used during the test).    FOLLOW UP: Our staff will call the number listed on your records the next business day following your procedure to check on you and address any questions or concerns that you may have regarding the information given to you following your procedure. If we do not reach you, we will leave a message.  However, if you are feeling well and you are not experiencing any problems, there is no need to return our call.  We will assume that you have returned to your regular daily activities without incident.  If any biopsies were taken you will be contacted by phone or by letter within the next 1-3 weeks.  Please call us at [phone] if you have not heard about the biopsies in 3 weeks.    SIGNATURES/CONFIDENTIALITY: You and/or your care partner have signed paperwork which will be entered into your electronic medical record.  These signatures attest to the fact that that the information above on your After Visit Summary has been reviewed and is understood.  Full responsibility of the confidentiality of this discharge information lies with you  and/or your care-partner.   Resume medications. Information given on diverticulosis.

## 2017-08-01 NOTE — Progress Notes (Signed)
Report to PACU, RN, vss, BBS= Clear.  

## 2017-08-01 NOTE — Progress Notes (Signed)
Pt's states no medical or surgical changes since previsit or office visit.  LAST WEEK HAD A COURSE OF PREDNISONE  ATE 3 GRANOLA BARS THIS WEEK

## 2017-08-01 NOTE — Op Note (Signed)
[place] Patient Name: Dana Vasquez Procedure Date: 08/01/2017 7:58 AM MRN: [id number] Endoscopist: Gatha Mayer , MD Age: 51 Referring MD:  Date of Birth: 03-16-1966 Gender: Female Account #: [id number] Procedure:                Colonoscopy Indications:              Screening for colorectal malignant neoplasm Medicines:                Propofol per Anesthesia, Monitored Anesthesia Care Procedure:                Pre-Anesthesia Assessment:                           - Prior to the procedure, a History and Physical                            was performed, and patient medications and                            allergies were reviewed. The patient's tolerance of                            previous anesthesia was also reviewed. The risks                            and benefits of the procedure and the sedation                            options and risks were discussed with the patient.                            All questions were answered, and informed consent                            was obtained. Prior Anticoagulants: The patient has                            taken no previous anticoagulant or antiplatelet                            agents. ASA Grade Assessment: III - A patient with                            severe systemic disease. After reviewing the risks                            and benefits, the patient was deemed in                            satisfactory condition to undergo the procedure.                           After obtaining informed consent, the colonoscope  was passed under direct vision. Throughout the                            procedure, the patient's blood pressure, pulse, and                            oxygen saturations were monitored continuously. The                            Colonoscope was introduced through the anus and                            advanced to the the cecum, identified by     appendiceal orifice and ileocecal valve. The                            ileocecal valve, appendiceal orifice, and rectum                            were photographed. The quality of the bowel                            preparation was excellent. The bowel preparation                            used was Miralax. Scope In: 8:01:48 AM Scope Out: 8:13:20 AM Scope Withdrawal Time: 0 hours 9 minutes 32 seconds  Total Procedure Duration: 0 hours 11 minutes 32 seconds  Findings:                 The perianal and digital rectal examinations were                            normal.                           Multiple small-mouthed diverticula were found in                            the sigmoid colon. There was no evidence of                            diverticular bleeding.                           The exam was otherwise without abnormality on                            direct and retroflexion views. Complications:            No immediate complications. Estimated blood loss:                            None. Estimated Blood Loss:     Estimated blood loss: none. Recommendation:           - Repeat colonoscopy in 10 years for screening  purposes.                           - Resume previous diet.                           - Continue present medications. Gatha Mayer, MD 08/01/2017 8:21:17 AM This report has been signed electronically.

## 2017-08-04 ENCOUNTER — Telehealth: Payer: Self-pay

## 2017-08-04 NOTE — Telephone Encounter (Signed)
Unable to leave message

## 2017-08-19 ENCOUNTER — Encounter: Payer: Self-pay | Admitting: Adult Health

## 2017-08-19 ENCOUNTER — Ambulatory Visit (INDEPENDENT_AMBULATORY_CARE_PROVIDER_SITE_OTHER): Payer: BC Managed Care – PPO | Admitting: Adult Health

## 2017-08-19 VITALS — BP 133/89 | HR 81 | Ht 64.0 in | Wt 257.2 lb

## 2017-08-19 DIAGNOSIS — G4733 Obstructive sleep apnea (adult) (pediatric): Secondary | ICD-10-CM

## 2017-08-19 DIAGNOSIS — Z9989 Dependence on other enabling machines and devices: Secondary | ICD-10-CM | POA: Diagnosis not present

## 2017-08-19 NOTE — Progress Notes (Signed)
PATIENT: Dana Vasquez DOB: [birth year]  REASON FOR VISIT: follow up- OSA on CPAP HISTORY FROM: patient  HISTORY OF PRESENT ILLNESS: Today 08/19/17 [name] is a 51 year old female with a history of obstructive sleep apnea CPAP. She returns today for follow-up. Her CPAP download indicates that she uses her machine 13 out of 30 days for compliance of 43%. On average she uses her machine 7 hours. She uses her machine greater than 4 hours 12 out of 13 days for compliance of 40%. She is on a minimum pressure of 5 cm water and max pressure 10 cm water with EPR of 3. Her residual AHI is 1.7. She has a leak in the 95th percentile at 30.6 L/m. She reports that she has a difficult time using the machine because she feels it causes her to have bronchitis and asthma. She feels that this is due to possible  bacteria in the machine. She does clean her machine but she is requesting a CPAP cleaning machine. I did advise that I'm not sure that insurance will cover this however she states that she is willing to pay out of pocket. The patient reports that in the last week she did receive new supplies however according to her download shows that she still has a significant leak. She returns today for an evaluation.  HISTORY  08/15/2016 Copied From Dr. Edwena Felty notes. I have the pleasure of seeing Roselyn Tay-Euceda 08/15/16 ,Following a split-night polysomnography from 04/14/2016. The patient suffers from a mild obstructive sleep apnea she slept all time of the baseline part of the study in supine AHI was 8.4, average heart rate was 70 without irregular heartbeats. She did have a sleep latency of only 3 minutes, and she was titrated during the study was only 7 cm water which reduced her AHI to 0.7 in the meantime the patient reports that she felt she had recurrent bronchitis while using CPAP and also some asthmatic symptoms. She is concerned that this may be related to the use of the machine perpetuating an  infection. She had once had to reschedule her sleep study because of having a flu but she has had bronchitis since using CPAP. After for series of antibiotics she was still not cured. He received steroids for the asthmatic component as well. In spite of all this she has been a very compliant CPAP user with an 87% compliance rate over the last 30 days and residual AHI of 0.7. She has minimal air leaks and the 91st percentile pressure is 9.9 cm she's using an AutoSet between 5 and 10 cm water with 3 cm EPR no changes to the settings have to be made that the patient may benefit from adjusting the humidifier.  REVIEW OF SYSTEMS: Out of a complete 14 system review of symptoms, the patient complains only of the following symptoms, and all other reviewed systems are negative.  ALLERGIES: Allergies  Allergen Reactions  . Amoxicillin     Other reaction(s): Headache  . Nitrofurantoin Monohyd Macro Other (See Comments)    Pt stated she gets a bad yeast infection  . Breo Ellipta [Fluticasone Furoate-Vilanterol]     Rash on chest  . Dexilant [Dexlansoprazole]     "bad stomachache"  . Meloxicam     Upset stomach and stomach pains    HOME MEDICATIONS: Outpatient Medications Prior to Visit  Medication Sig Dispense Refill  . fluticasone (FLOVENT HFA) 110 MCG/ACT inhaler Inhale 2 puffs into the lungs 2 (two) times daily. (Patient  not taking: Reported on 07/18/2017) 1 Inhaler 5  . HYDROMET 5-1.5 MG/5ML syrup TAKE 5 MLS BY MOUTH EVERY 4-6 HOURS AS NEEDED  0  . levothyroxine (SYNTHROID, LEVOTHROID) 125 MCG tablet Take 125 mcg by mouth daily before breakfast.     . losartan (COZAAR) 25 MG tablet Take 25 mg by mouth daily.     No facility-administered medications prior to visit.     PAST MEDICAL HISTORY: Past Medical History:  Diagnosis Date  . Allergy   . Anemia   . Anxiety    no meds  . Arthritis    knees - otc med prn  . Asthma    allergy related - rarely uses inhaler  . Bruises easily   .  Cancer ([place])    1995;thyroid cancer  . Depression    no meds  . Diabetes mellitus without complication (HCC)    borderline - no meds  . Ectopic pregnancy   . Euthyroid   . Fatigue   . Fibromyalgia   . GERD (gastroesophageal reflux disease)   . Headache(784.0)    otc med prn - last one 02/2014  . History of blood transfusion 2000   In [place], [place] at [facility] - ? 4 units transfused  . Hypertension   . Hypothyroidism   . Knee pain   . Sleep apnea    uses CPAP  . SVD (spontaneous vaginal delivery)    x 3  . Thyroid disease    thyroidectomy 1995    PAST SURGICAL HISTORY: Past Surgical History:  Procedure Laterality Date  . ABDOMINAL HYSTERECTOMY N/A 04/20/2014   Procedure: Total ABDOMINAL HYSTERECTOMY partial right salpingectomy;  Surgeon: Eldred Manges, MD;  Location: [facility];  Service: Gynecology;  Laterality: N/A;  . BREATH TEK H PYLORI  09/18/2011   Procedure: BREATH TEK H PYLORI;  Surgeon: Pedro Earls, MD;  Location: [facility];  Service: General;  Laterality: N/A;  . BUNIONECTOMY     left;2015  . COLONOSCOPY    . DILATION AND CURETTAGE OF UTERUS  1988   endometriosis  . fallopian tubes removed     left tube removed per patient - laparotomy  . LYSIS OF ADHESION N/A 04/20/2014   Procedure: LYSIS OF ADHESION;  Surgeon: Eldred Manges, MD;  Location: [facility];  Service: Gynecology;  Laterality: N/A;  . THYROIDECTOMY    . tubal ligation    . TUBAL LIGATION    . WISDOM TOOTH EXTRACTION      FAMILY HISTORY: Family History  Problem Relation Age of Onset  . Hypertension Mother   . Cancer Mother        uterine  . Cancer Father   . Cancer Brother        stomach; 109  . Early death Brother   . Stroke Maternal Uncle   . Heart disease Maternal Uncle   . Food Allergy Daughter   . Allergic rhinitis Daughter   . Allergic rhinitis Daughter   . Angioedema Neg Hx   . Asthma Neg Hx   . Eczema Neg Hx   . Immunodeficiency Neg Hx   . Urticaria Neg Hx     SOCIAL  HISTORY: Social History   Social History  . Marital status: Married    Spouse name: N/A  . Number of children: N/A  . Years of education: N/A   Occupational History  . Not on file.   Social History Main Topics  . Smoking status: Never Smoker  . Smokeless tobacco: Never  Used  . Alcohol use 0.0 oz/week     Comment: socially; every 3-4 months  . Drug use: No  . Sexual activity: Yes    Birth control/ protection: Surgical   Other Topics Concern  . Not on file   Social History Narrative  . No narrative on file      PHYSICAL EXAM  Vitals:   08/19/17 0715  BP: 133/89  Pulse: 81  Weight: 257 lb 3.2 oz (116.7 kg)  Height: 5\' 4"  (1.626 m)   Body mass index is 44.15 kg/m.  Generalized: Well developed, in no acute distress   Neurological examination  Mentation: Alert oriented to time, place, history taking. Follows all commands speech and language fluent Cranial nerve II-XII: Pupils were equal round reactive to light. Extraocular movements were full, visual field were full on confrontational test. Facial sensation and strength were normal. Uvula tongue midline. Head turning and shoulder shrug  were normal and symmetric. Motor: The motor testing reveals 5 over 5 strength of all 4 extremities. Good symmetric motor tone is noted throughout.  Sensory: Sensory testing is intact to soft touch on all 4 extremities. No evidence of extinction is noted.  Coordination: Cerebellar testing reveals good finger-nose-finger and heel-to-shin bilaterally.  Gait and station: Gait is normal.     DIAGNOSTIC DATA (LABS, IMAGING, TESTING) - I reviewed patient records, labs, notes, testing and imaging myself where available.  Lab Results  Component Value Date   WBC 12.0 (H) 06/05/2014   HGB 9.3 (L) 06/05/2014   HCT 29.1 (L) 06/05/2014   MCV 67.5 (L) 06/05/2014   PLT 366 06/05/2014      Component Value Date/Time   NA 141 06/05/2014 0556   K 3.9 06/05/2014 0556   CL 105 06/05/2014 0556     CO2 24 06/05/2014 0556   GLUCOSE 107 (H) 06/05/2014 0556   BUN 16 06/05/2014 0556   CREATININE 0.89 06/05/2014 0556   CREATININE 1.01 10/10/2011 0914   CALCIUM 9.0 06/05/2014 0556   PROT 7.5 10/10/2011 0914   ALBUMIN 4.0 10/10/2011 0914   AST 11 10/10/2011 0914   ALT 11 10/10/2011 0914   ALKPHOS 65 10/10/2011 0914   BILITOT 0.4 10/10/2011 0914   GFRNONAA 76 (L) 06/05/2014 0556   GFRAA 88 (L) 06/05/2014 0556   Lab Results  Component Value Date   CHOL 166 10/10/2011   HDL 45 10/10/2011   LDLCALC 113 (H) 10/10/2011   TRIG 41 10/10/2011   CHOLHDL 3.7 10/10/2011   Lab Results  Component Value Date   HGBA1C 6.2 (H) 10/10/2011   No results found for: [id number] Lab Results  Component Value Date   TSH 1.672 10/10/2011      ASSESSMENT AND PLAN 51 y.o. year old female  has a past medical history of Allergy; Anemia; Anxiety; Arthritis; Asthma; Bruises easily; Cancer ([place]); Depression; Diabetes mellitus without complication ([place]); Ectopic pregnancy; Euthyroid; Fatigue; Fibromyalgia; GERD (gastroesophageal reflux disease); Headache(784.0); History of blood transfusion (2000); Hypertension; Hypothyroidism; Knee pain; Sleep apnea; SVD (spontaneous vaginal delivery); and Thyroid disease. here with :  1. OSA on CPAP  The patient's download shows compliance. She reports that she is scared to use the machine because it typically radiates to asthma and bronchitis. She is requesting a CPAP cleaning machine to see if this is beneficial. Her download also shows a significant leak. I will have her over at the sleep lab today for a mask refitting. She is advised to continue using the CPAP nightly. If her symptoms worsen  or she develops new symptoms she should let us know. She will follow-up in 3 months or sooner if needed.  I spent 15 minutes with the patient. 50% of this time was spent reviewing the patient's CPAP download     Ward Givens, MSN, NP-C 08/19/2017, 7:09 AM [facility] [address] [place], [place] [address] [phone]

## 2017-08-19 NOTE — Patient Instructions (Addendum)
Your Plan:  Continue using CPAP nightly- try using everynight Mask refitting today  If your symptoms worsen or you develop new symptoms please let us know.    Thank you for coming to see [place] at [facility]. I hope we have been able to provide you high quality care today.  You may receive a patient satisfaction survey over the next few weeks. We would appreciate your feedback and comments so that we may continue to improve ourselves and the health of our patients.

## 2017-08-19 NOTE — Progress Notes (Signed)
Fax confirmation received for Cpap supplies (cleaning machine).  [phone] [email or web address]

## 2017-09-02 ENCOUNTER — Other Ambulatory Visit: Payer: Self-pay | Admitting: Internal Medicine

## 2017-09-02 DIAGNOSIS — R221 Localized swelling, mass and lump, neck: Secondary | ICD-10-CM

## 2017-09-02 DIAGNOSIS — IMO0002 Reserved for concepts with insufficient information to code with codable children: Secondary | ICD-10-CM

## 2017-09-02 DIAGNOSIS — R229 Localized swelling, mass and lump, unspecified: Secondary | ICD-10-CM

## 2017-09-02 DIAGNOSIS — R609 Edema, unspecified: Secondary | ICD-10-CM

## 2017-09-12 ENCOUNTER — Ambulatory Visit
Admission: RE | Admit: 2017-09-12 | Discharge: 2017-09-12 | Disposition: A | Discharge status: Home / Self Care | Payer: BC Managed Care – PPO | Source: Ambulatory Visit | Attending: Internal Medicine | Admitting: Internal Medicine

## 2017-09-12 DIAGNOSIS — R221 Localized swelling, mass and lump, neck: Secondary | ICD-10-CM

## 2017-09-12 DIAGNOSIS — R609 Edema, unspecified: Secondary | ICD-10-CM

## 2017-09-12 DIAGNOSIS — R229 Localized swelling, mass and lump, unspecified: Secondary | ICD-10-CM

## 2017-09-12 DIAGNOSIS — IMO0002 Reserved for concepts with insufficient information to code with codable children: Secondary | ICD-10-CM

## 2017-09-29 ENCOUNTER — Telehealth: Payer: Self-pay | Admitting: Allergy and Immunology

## 2017-09-29 NOTE — Telephone Encounter (Signed)
I called patient to discuss reaction. She stated something about Qvar, then told me that [place] and wanted to know if could call back in about 5-10 minutes. I advised her that we would be closing in 15 minutes. She then asked if I could just call back tomorrow. I told her yes and I will do so at earliest convenience tomorrow.

## 2017-09-29 NOTE — Telephone Encounter (Signed)
Patient called back stating that she was switched from Qvar to [facility] which was working until she started developing a rash on her chest. She then was switched to Symbicort which is also causing this rash on her chest. She would like to know if there is another medication she could try. Please advise.

## 2017-09-29 NOTE — Telephone Encounter (Signed)
Pt called and needs to talk with you about her meds she is having reaction. [phone] home. [phone] work

## 2017-09-29 NOTE — Telephone Encounter (Signed)
Sure, try Spiriva Respimat 1.25 mcg, 2 inhalations daily.

## 2017-09-30 MED ORDER — TIOTROPIUM BROMIDE MONOHYDRATE 1.25 MCG/ACT IN AERS: 2.0000 | INHALATION_SPRAY | Freq: Every day | RESPIRATORY_TRACT | 5 refills | Status: DC

## 2017-09-30 NOTE — Telephone Encounter (Signed)
Called patient, but no answer. I did not get to leave a message because the line was silent. Prescription has been sent in already.

## 2017-10-01 NOTE — Telephone Encounter (Signed)
Left message for pt to call office

## 2017-10-03 NOTE — Telephone Encounter (Signed)
Attempted to call Pt to advise that RX for Spiriva 1.25 mcg, 2 inhalations daily has been sent to pharmacy. Mobile and Home phone # have no VM available. Unable to leave VM with Pt's employer as no extension was provided.

## 2017-10-07 NOTE — Telephone Encounter (Signed)
Informed pt that rx is at pharmacy she stated she was worried about another rash. I told her to try it and call me back in a few days to informe me of how she was doing

## 2017-10-07 NOTE — Telephone Encounter (Signed)
Noted  

## 2017-10-08 ENCOUNTER — Other Ambulatory Visit: Payer: Self-pay | Admitting: Internal Medicine

## 2017-10-08 DIAGNOSIS — R221 Localized swelling, mass and lump, neck: Secondary | ICD-10-CM

## 2017-10-09 ENCOUNTER — Telehealth: Payer: Self-pay | Admitting: Allergy and Immunology

## 2017-10-09 ENCOUNTER — Emergency Department (HOSPITAL_COMMUNITY)
Admission: EM | Admit: 2017-10-09 | Discharge: 2017-10-09 | Disposition: A | Discharge status: Home / Self Care | Payer: BC Managed Care – PPO | Attending: Emergency Medicine | Admitting: Emergency Medicine

## 2017-10-09 ENCOUNTER — Emergency Department (HOSPITAL_COMMUNITY): Payer: BC Managed Care – PPO

## 2017-10-09 ENCOUNTER — Encounter (HOSPITAL_COMMUNITY): Payer: Self-pay | Admitting: Emergency Medicine

## 2017-10-09 DIAGNOSIS — E039 Hypothyroidism, unspecified: Secondary | ICD-10-CM | POA: Insufficient documentation

## 2017-10-09 DIAGNOSIS — Z85850 Personal history of malignant neoplasm of thyroid: Secondary | ICD-10-CM | POA: Diagnosis not present

## 2017-10-09 DIAGNOSIS — I129 Hypertensive chronic kidney disease with stage 1 through stage 4 chronic kidney disease, or unspecified chronic kidney disease: Secondary | ICD-10-CM | POA: Insufficient documentation

## 2017-10-09 DIAGNOSIS — Z79899 Other long term (current) drug therapy: Secondary | ICD-10-CM | POA: Insufficient documentation

## 2017-10-09 DIAGNOSIS — E1122 Type 2 diabetes mellitus with diabetic chronic kidney disease: Secondary | ICD-10-CM | POA: Insufficient documentation

## 2017-10-09 DIAGNOSIS — J45909 Unspecified asthma, uncomplicated: Secondary | ICD-10-CM | POA: Diagnosis not present

## 2017-10-09 DIAGNOSIS — M940 Chondrocostal junction syndrome [Tietze]: Secondary | ICD-10-CM | POA: Diagnosis not present

## 2017-10-09 DIAGNOSIS — R0789 Other chest pain: Secondary | ICD-10-CM | POA: Insufficient documentation

## 2017-10-09 DIAGNOSIS — R079 Chest pain, unspecified: Secondary | ICD-10-CM | POA: Diagnosis present

## 2017-10-09 DIAGNOSIS — N182 Chronic kidney disease, stage 2 (mild): Secondary | ICD-10-CM | POA: Diagnosis not present

## 2017-10-09 LAB — CBC
HCT: 36.2 % (ref 36.0–46.0)
Hemoglobin: 12 g/dL (ref 12.0–15.0)
MCH: 25.4 pg — ABNORMAL LOW (ref 26.0–34.0)
MCHC: 33.1 g/dL (ref 30.0–36.0)
MCV: 76.5 fL — ABNORMAL LOW (ref 78.0–100.0)
Platelets: 360 10*3/uL (ref 150–400)
RBC: 4.73 MIL/uL (ref 3.87–5.11)
RDW: 15.8 % — AB (ref 11.5–15.5)
WBC: 10.6 10*3/uL — ABNORMAL HIGH (ref 4.0–10.5)

## 2017-10-09 LAB — BASIC METABOLIC PANEL
Anion gap: 5 (ref 5–15)
BUN: 15 mg/dL (ref 6–20)
CALCIUM: 8.6 mg/dL — AB (ref 8.9–10.3)
CO2: 29 mmol/L (ref 22–32)
Chloride: 107 mmol/L (ref 101–111)
Creatinine, Ser: 1 mg/dL (ref 0.44–1.00)
GFR calc Af Amer: >60 mL/min (ref >60)
GLUCOSE: 114 mg/dL — AB (ref 65–99)
Potassium: 3.7 mmol/L (ref 3.5–5.1)
Sodium: 141 mmol/L (ref 135–145)

## 2017-10-09 LAB — I-STAT BETA HCG BLOOD, ED (MC, WL, AP ONLY): I-stat hCG, quantitative: <5 m[IU]/mL (ref <5)

## 2017-10-09 LAB — I-STAT TROPONIN, ED: TROPONIN I, POC: 0 ng/mL (ref 0.00–0.08)

## 2017-10-09 MED ORDER — TIOTROPIUM BROMIDE MONOHYDRATE 1.25 MCG/ACT IN AERS: 2.0000 | INHALATION_SPRAY | Freq: Every day | RESPIRATORY_TRACT | 5 refills | Status: DC

## 2017-10-09 MED ORDER — NAPROXEN 500 MG PO TABS: 500.0000 mg | ORAL_TABLET | Freq: Two times a day (BID) | ORAL | 0 refills | Status: DC

## 2017-10-09 NOTE — Telephone Encounter (Signed)
Pt was called and said that we called in her Spiriva ansd she went to pharmacy [facility] on [address] and it was not there someone had called it into [facility] [address]. [phone].

## 2017-10-09 NOTE — Telephone Encounter (Signed)
Prescription has been sent to the correct pharmacy.

## 2017-10-09 NOTE — ED Provider Notes (Signed)
[facility] DEPT Provider Note   CSN: [id number] Arrival date & time: 10/09/17  1518     History   Chief Complaint Chief Complaint  Patient presents with  . Chest Pain    HPI Dana Vasquez is a 51 y.o. female.CC:  Left chest pain  HPI: 51 y/o female with no h/o heart disease. +PMH of htn on losartan, and allergies/asthma.   States that last night at 8:00 she was sitting on the floor wrapping some Christmas presents developed some sharp pain in her left anterior chest just adjacent to her sternum.  Remains well localized.  Sharp.  Hurts to breathe, touch, or lay on.  No cough.  No fever.  No history of heart disease.  Non-smoker.  Past Medical History:  Diagnosis Date  . Allergy   . Anemia   . Anxiety    no meds  . Arthritis    knees - otc med prn  . Asthma    allergy related - rarely uses inhaler  . Bruises easily   . Cancer ([place])    1995;thyroid cancer  . Depression    no meds  . Diabetes mellitus without complication (HCC)    borderline - no meds  . Ectopic pregnancy   . Euthyroid   . Fatigue   . Fibromyalgia   . GERD (gastroesophageal reflux disease)   . Headache(784.0)    otc med prn - last one 02/2014  . History of blood transfusion 2000   In [place], [place] at [place] - ? 4 units transfused  . Hypertension   . Hypothyroidism   . Knee pain   . Sleep apnea    uses CPAP  . SVD (spontaneous vaginal delivery)    x 3  . Thyroid disease    thyroidectomy 1995    Patient Active Problem List   Diagnosis Date Noted  . Encounter for general adult medical examination without abnormal findings 12/12/2016  . 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 Glomerular disorders in diseases classified elsewhere 12/12/2016  . Other long term (current) drug therapy 12/12/2016  . Allergic rhinitis 06/07/2016  . Asthma with acute  exacerbation 06/07/2016  . Chronic kidney disease, stage II (mild) 04/28/2016  . Acute recurrent pansinusitis 04/03/2016  . Cough, persistent 04/03/2016  . Hoarseness 04/03/2016  . Obesity hypoventilation syndrome ([place]) 02/29/2016  . Obstructive sleep apnea 02/29/2016  . Diabetes mellitus due to underlying condition with complication, without long-term current use of insulin ([place]) 02/29/2016  . Fibroid, uterine 04/20/2014  . Fibroids 04/19/2014  . Type 2 diabetes mellitus with diabetic chronic kidney disease ([place]) 04/19/2014  . Obesity, Class III, BMI 40-49.9 (morbid obesity) ([place]) 02/12/2012  . Menorrhagia 02/12/2012  . Hx of ectopic pregnancy 02/12/2012  . Plantar fasciitis 05/29/2011    Past Surgical History:  Procedure Laterality Date  . ABDOMINAL HYSTERECTOMY N/A 04/20/2014   Procedure: Total ABDOMINAL HYSTERECTOMY partial right salpingectomy;  Surgeon: Eldred Manges, MD;  Location: [facility];  Service: Gynecology;  Laterality: N/A;  . BREATH TEK H PYLORI  09/18/2011   Procedure: BREATH TEK H PYLORI;  Surgeon: Pedro Earls, MD;  Location: [facility];  Service: General;  Laterality: N/A;  . BUNIONECTOMY     left;2015  . COLONOSCOPY    . DILATION AND CURETTAGE OF UTERUS  1988   endometriosis  . fallopian tubes removed     left tube removed per  patient - laparotomy  . LYSIS OF ADHESION N/A 04/20/2014   Procedure: LYSIS OF ADHESION;  Surgeon: Eldred Manges, MD;  Location: [facility];  Service: Gynecology;  Laterality: N/A;  . THYROIDECTOMY    . tubal ligation    . TUBAL LIGATION    . WISDOM TOOTH EXTRACTION      OB History    No data available       Home Medications    Prior to Admission medications   Medication Sig Start Date End Date Taking? Authorizing Provider  BREO ELLIPTA 100-25 MCG/INH AEPB INHALE 1 PUFF PO QD AT THE SAME TIME EACH DAY. 07/23/17   [provider]  fluticasone (FLOVENT HFA) 110 MCG/ACT inhaler Inhale 2 puffs into the lungs 2 (two)  times daily. 01/28/17   Bobbitt, Sedalia Muta, MD  levothyroxine (SYNTHROID, LEVOTHROID) 125 MCG tablet Take 125 mcg by mouth daily before breakfast.     [provider]  losartan (COZAAR) 25 MG tablet Take 25 mg by mouth daily.    [provider]  montelukast (SINGULAIR) 10 MG tablet Take 10 mg by mouth daily.    [provider]  naproxen (NAPROSYN) 500 MG tablet Take 1 tablet (500 mg total) by mouth 2 (two) times daily. 10/09/17   Tanna Furry, MD  PROAIR HFA [phone] Base) MCG/ACT inhaler as needed. 05/15/17   [provider]  Tiotropium Bromide Monohydrate (SPIRIVA RESPIMAT) 1.25 MCG/ACT AERS Inhale 2 puffs into the lungs daily. 10/09/17   Bobbitt, Sedalia Muta, MD    Family History Family History  Problem Relation Age of Onset  . Hypertension Mother   . Cancer Mother        uterine  . Cancer Father   . Cancer Brother        stomach; 36  . Early death Brother   . Stroke Maternal Uncle   . Heart disease Maternal Uncle   . Food Allergy Daughter   . Allergic rhinitis Daughter   . Allergic rhinitis Daughter   . Angioedema Neg Hx   . Asthma Neg Hx   . Eczema Neg Hx   . Immunodeficiency Neg Hx   . Urticaria Neg Hx     Social History Social History   Tobacco Use  . Smoking status: Never Smoker  . Smokeless tobacco: Never Used  Substance Use Topics  . Alcohol use: Yes    Alcohol/week: 0.0 oz    Comment: socially; every 3-4 months  . Drug use: No     Allergies   Amoxicillin; Nitrofurantoin monohyd macro; Breo ellipta [fluticasone furoate-vilanterol]; Dexilant [dexlansoprazole]; and Meloxicam   Review of Systems Review of Systems  Constitutional: Negative for appetite change, chills, diaphoresis, fatigue and fever.  HENT: Negative for mouth sores, sore throat and trouble swallowing.   Eyes: Negative for visual disturbance.  Respiratory: Negative for cough, chest tightness, shortness of breath and wheezing.   Cardiovascular: Positive for  chest pain.  Gastrointestinal: Negative for abdominal distention, abdominal pain, diarrhea, nausea and vomiting.  Endocrine: Negative for polydipsia, polyphagia and polyuria.  Genitourinary: Negative for dysuria, frequency and hematuria.  Musculoskeletal: Negative for gait problem.  Skin: Negative for color change, pallor and rash.  Neurological: Negative for dizziness, syncope, light-headedness and headaches.  Hematological: Does not bruise/bleed easily.  Psychiatric/Behavioral: Negative for behavioral problems and confusion.     Physical Exam Updated Vital Signs BP 140/80   Pulse 75   Temp 98.1 F (36.7 C) (Oral)   Resp (!) 22  Ht 5\' 4"  (1.626 m)   Wt 116.6 kg (257 lb)   LMP 04/07/2014   SpO2 98%   BMI 44.11 kg/m   Physical Exam  Constitutional: She is oriented to person, place, and time. She appears well-developed and well-nourished. No distress.  HENT:  Head: Normocephalic.  Eyes: Conjunctivae are normal. Pupils are equal, round, and reactive to light. No scleral icterus.  Neck: Normal range of motion. Neck supple. No thyromegaly present.  Cardiovascular: Normal rate and regular rhythm. Exam reveals no gallop and no friction rub.  No murmur heard. Tenderness to the costal cartilages immediately adjacent to the left upper sternal border.  No crepitus.  No subcu air.  Clear bilateral breath sounds.  Normal heart tones  Pulmonary/Chest: Effort normal and breath sounds normal. No respiratory distress. She has no wheezes. She has no rales.  Abdominal: Soft. Bowel sounds are normal. She exhibits no distension. There is no tenderness. There is no rebound.  Musculoskeletal: Normal range of motion.  Neurological: She is alert and oriented to person, place, and time.  Skin: Skin is warm and dry. No rash noted.  Psychiatric: She has a normal mood and affect. Her behavior is normal.     ED Treatments / Results  Labs (all labs ordered are listed, but only abnormal results are  displayed) Labs Reviewed  BASIC METABOLIC PANEL - Abnormal; Notable for the following components:      Result Value   Glucose, Bld 114 (*)    Calcium 8.6 (*)    All other components within normal limits  CBC - Abnormal; Notable for the following components:   WBC 10.6 (*)    MCV 76.5 (*)    MCH 25.4 (*)    RDW 15.8 (*)    All other components within normal limits  I-STAT TROPONIN, ED  I-STAT BETA HCG BLOOD, ED ([facility], [facility], [facility] ONLY)    EKG  EKG Interpretation None       Radiology Dg Chest 2 View  Result Date: 10/09/2017 CLINICAL DATA:  Chest pain over the last day. History of hypertension. EXAM: CHEST  2 VIEW COMPARISON:  04/24/2017 FINDINGS: Heart size is normal. Mediastinal shadows are normal. The lungs are clear. No bronchial thickening. No infiltrate, mass, effusion or collapse. Pulmonary vascularity is normal. No bony abnormality. IMPRESSION: Normal chest Electronically Signed   By: Nelson Chimes M.D.   On: 10/09/2017 17:39    Procedures Procedures (including critical care time)  Medications Ordered in ED Medications - No data to display   Initial Impression / Assessment and Plan / ED Course  I have reviewed the triage vital signs and the nursing notes.  Pertinent labs & imaging results that were available during my care of the patient were reviewed by me and considered in my medical decision making (see chart for details).     EKG with baseline wander, but interpretable.  No ST changes.  No sign of injury or ectopy.  Chest x-ray normal.    Symptoms, exam, and findings consistent with costochondritis.  Reassured.  Plan anti-inflammatories.  Discharge home  Final Clinical Impressions(s) / ED Diagnoses   Final diagnoses:  Chest wall pain  Costochondritis    ED Discharge Orders        Ordered    naproxen (NAPROSYN) 500 MG tablet  2 times daily     10/09/17 1808       Tanna Furry, MD 10/09/17 [phone]

## 2017-10-09 NOTE — Discharge Instructions (Signed)
Expect slow improvement over several days.

## 2017-10-09 NOTE — ED Triage Notes (Signed)
Patient c/o generalized chest pains that started last night around 830p. Patient reports taking ASA which helped chest pain some but still there and even sore with palpation.  Patient denies any cough, falls, injuries.

## 2017-10-28 HISTORY — PX: FOOT SURGERY: SHX648

## 2017-11-04 ENCOUNTER — Ambulatory Visit
Admission: RE | Admit: 2017-11-04 | Discharge: 2017-11-04 | Disposition: A | Discharge status: Home / Self Care | Payer: BC Managed Care – PPO | Source: Ambulatory Visit | Attending: Internal Medicine | Admitting: Internal Medicine

## 2017-11-04 DIAGNOSIS — R221 Localized swelling, mass and lump, neck: Secondary | ICD-10-CM

## 2017-11-04 MED ORDER — IOPAMIDOL (ISOVUE-300) INJECTION 61%
75.0000 mL | Freq: Once | INTRAVENOUS | Status: DC | PRN
Start: 2017-11-04 — End: 2017-11-05

## 2017-11-06 ENCOUNTER — Other Ambulatory Visit (HOSPITAL_COMMUNITY): Payer: Self-pay | Admitting: Surgery

## 2017-11-06 ENCOUNTER — Other Ambulatory Visit: Payer: Self-pay | Admitting: Surgery

## 2017-11-17 ENCOUNTER — Ambulatory Visit (HOSPITAL_COMMUNITY)
Admission: RE | Admit: 2017-11-17 | Discharge: 2017-11-17 | Disposition: A | Discharge status: Home / Self Care | Payer: BC Managed Care – PPO | Source: Ambulatory Visit | Attending: Surgery | Admitting: Surgery

## 2017-11-17 DIAGNOSIS — Z6841 Body Mass Index (BMI) 40.0 and over, adult: Secondary | ICD-10-CM | POA: Insufficient documentation

## 2017-11-17 DIAGNOSIS — Z01818 Encounter for other preprocedural examination: Secondary | ICD-10-CM | POA: Insufficient documentation

## 2017-11-17 DIAGNOSIS — K449 Diaphragmatic hernia without obstruction or gangrene: Secondary | ICD-10-CM | POA: Insufficient documentation

## 2017-11-27 ENCOUNTER — Ambulatory Visit: Payer: Self-pay | Admitting: Skilled Nursing Facility1

## 2017-12-01 ENCOUNTER — Ambulatory Visit (INDEPENDENT_AMBULATORY_CARE_PROVIDER_SITE_OTHER): Payer: BC Managed Care – PPO | Admitting: Neurology

## 2017-12-01 ENCOUNTER — Encounter: Payer: Self-pay | Admitting: Neurology

## 2017-12-01 VITALS — BP 132/80 | HR 85 | Ht 64.0 in | Wt 269.0 lb

## 2017-12-01 DIAGNOSIS — G4733 Obstructive sleep apnea (adult) (pediatric): Secondary | ICD-10-CM

## 2017-12-01 DIAGNOSIS — Z9989 Dependence on other enabling machines and devices: Secondary | ICD-10-CM

## 2017-12-01 DIAGNOSIS — Z0289 Encounter for other administrative examinations: Secondary | ICD-10-CM

## 2017-12-02 ENCOUNTER — Ambulatory Visit: Payer: Self-pay | Admitting: Skilled Nursing Facility1

## 2017-12-03 ENCOUNTER — Encounter: Payer: Self-pay | Admitting: Neurology

## 2017-12-03 ENCOUNTER — Ambulatory Visit: Payer: BC Managed Care – PPO | Admitting: Neurology

## 2017-12-03 VITALS — BP 144/86 | HR 77 | Ht 64.0 in | Wt 265.0 lb

## 2017-12-03 DIAGNOSIS — G4733 Obstructive sleep apnea (adult) (pediatric): Secondary | ICD-10-CM

## 2017-12-03 DIAGNOSIS — Z9989 Dependence on other enabling machines and devices: Secondary | ICD-10-CM

## 2017-12-03 DIAGNOSIS — Z6841 Body Mass Index (BMI) 40.0 and over, adult: Secondary | ICD-10-CM

## 2017-12-03 DIAGNOSIS — J4540 Moderate persistent asthma, uncomplicated: Secondary | ICD-10-CM | POA: Insufficient documentation

## 2017-12-03 NOTE — Progress Notes (Signed)
PATIENT: Dana Vasquez DOB: [birth year]  REASON FOR VISIT: follow up- OSA on CPAP HISTORY FROM: patient  HISTORY OF PRESENT ILLNESS:  Today 12/03/17, I have the pleasure of seeing Dana Vasquez, in a RV for CPAP compliance.  The patient has noted an increase of bronchitis sinusitis and other upper airway infections during the winter months and she correlated this in her mind to CPAP machine harboring bacteria.  She has used a vinegar water mixed now to clean the machine, but she is saving up for so clean machine.  Her Epworth sleepiness score is still elevated at 14 points and her fatigue severity at 46 points, and her compliance report for CPAP shows only 21 out of 30 days of use which is a 70% compliance and some of these days under 4 hours average use of time is 3 hours and 57 minutes she just falls short of 4 hours.  The AHI is 1.5 showing that the current settings worked very well for the patient the AutoSet is between 5 and 10 cmH2O pressure window to 3 cm EPR I actually would like to lower the EPR response. She is asthmatic and this may help her. Her humidifier settings were discussed , too- she probably needs to bump the setting from 3 to 5 during heating season. Her main risk factor has been her elevated body mass index, the patient is a thyroid cancer survivor and weight loss has been difficult since thyroidectomy she has been seen by Dr. Thelma Barge surgery in preparation for a gastric sleeve procedure.  Surgery will likely take place this spring.CD    MM- Date ? [name] is a 52 year old female with a history of obstructive sleep apnea CPAP. She returns today for follow-up. Her CPAP download indicates that she uses her machine 13 out of 30 days for compliance of 43%. On average she uses her machine 7 hours. She uses her machine greater than 4 hours 12 out of 13 days for compliance of 40%. She is on a minimum pressure of 5 cm water and max pressure 10 cm water with  EPR of 3. Her residual AHI is 1.7. She has a leak in the 95th percentile at 30.6 L/m. She reports that she has a difficult time using the machine because she feels it causes her to have bronchitis and asthma. She feels that this is due to possible  bacteria in the machine. She does clean her machine but she is requesting a CPAP cleaning machine. I did advise that I'm not sure that insurance will cover this however she states that she is willing to pay out of pocket. The patient reports that in the last week she did receive new supplies however according to her download shows that she still has a significant leak. She returns today for an evaluation.  HISTORY  08/15/2016 Copied From Dr. Edwena Felty notes. I have the pleasure of seeing Dana Vasquez ,Following a split-night polysomnography from 04/14/2016. The patient suffers from a mild obstructive sleep apnea she slept all time of the baseline part of the study in supine AHI was 8.4, average heart rate was 70 without irregular heartbeats. She did have a sleep latency of only 3 minutes, and she was titrated during the study was only 7 cm water which reduced her AHI to 0.7 in the meantime the patient reports that she felt she had recurrent bronchitis while using CPAP and also some asthmatic symptoms. She is concerned that this may be  related to the use of the machine perpetuating an infection. She had once had to reschedule her sleep study because of having a flu but she has had bronchitis since using CPAP. After for series of antibiotics she was still not cured. He received steroids for the asthmatic component as well. In spite of all this she has been a very compliant CPAP user with an 87% compliance rate over the last 30 days and residual AHI of 0.7. She has minimal air leaks and the 91st percentile pressure is 9.9 cm she's using an AutoSet between 5 and 10 cm water with 3 cm EPR no changes to the settings have to be made that the patient may benefit  from adjusting the humidifier.  REVIEW OF SYSTEMS: Out of a complete 14 system review of symptoms, the patient complains only of the following symptoms, and all other reviewed systems are negative.    ALLERGIES: Allergies  Allergen Reactions  . Amoxicillin     Other reaction(s): Headache  . Nitrofurantoin Monohyd Macro Other (See Comments)    Pt stated she gets a bad yeast infection  . Breo Ellipta [Fluticasone Furoate-Vilanterol]     Rash on chest  . Dexilant [Dexlansoprazole]     "bad stomachache"  . Meloxicam     Upset stomach and stomach pains    HOME MEDICATIONS: Outpatient Medications Prior to Visit  Medication Sig Dispense Refill  . Levothyroxine Sodium 137 MCG CAPS Take 125 mcg by mouth daily before breakfast.     . losartan (COZAAR) 25 MG tablet Take 25 mg by mouth daily.    . montelukast (SINGULAIR) 10 MG tablet Take 10 mg by mouth daily as needed.     . naproxen (NAPROSYN) 500 MG tablet Take 1 tablet (500 mg total) by mouth 2 (two) times daily. (Patient taking differently: Take 500 mg by mouth 2 (two) times daily as needed. ) 30 tablet 0  . PROAIR HFA 108 (90 Base) MCG/ACT inhaler as needed.  5  . Beclomethasone Dipropionate (QVAR IN) Inhale into the lungs daily.    . Tiotropium Bromide Monohydrate (SPIRIVA RESPIMAT) 1.25 MCG/ACT AERS Inhale 2 puffs into the lungs daily. 4 g 5   No facility-administered medications prior to visit.     PAST MEDICAL HISTORY: Past Medical History:  Diagnosis Date  . Allergy   . Anemia   . Anxiety    no meds  . Arthritis    knees - otc med prn  . Asthma    allergy related - rarely uses inhaler  . Bruises easily   . Cancer ([place])    1995;thyroid cancer  . Depression    no meds  . Diabetes mellitus without complication (HCC)    borderline - no meds  . Ectopic pregnancy   . Euthyroid   . Fatigue   . Fibromyalgia   . GERD (gastroesophageal reflux disease)   . Headache(784.0)    otc med prn - last one 02/2014  . History of  blood transfusion 2000   In [place], [place] at [place] - ? 4 units transfused  . Hypertension   . Hypothyroidism   . Knee pain   . Sleep apnea    uses CPAP  . SVD (spontaneous vaginal delivery)    x 3  . Thyroid disease    thyroidectomy 1995    PAST SURGICAL HISTORY: Past Surgical History:  Procedure Laterality Date  . ABDOMINAL HYSTERECTOMY N/A 04/20/2014   Procedure: Total ABDOMINAL HYSTERECTOMY partial right salpingectomy;  Surgeon: Lorriane Shire  Midge Minium, MD;  Location: [facility];  Service: Gynecology;  Laterality: N/A;  . BREATH TEK H PYLORI  09/18/2011   Procedure: BREATH TEK H PYLORI;  Surgeon: Pedro Earls, MD;  Location: [facility];  Service: General;  Laterality: N/A;  . BUNIONECTOMY     left;2015  . COLONOSCOPY    . DILATION AND CURETTAGE OF UTERUS  1988   endometriosis  . fallopian tubes removed     left tube removed per patient - laparotomy  . LYSIS OF ADHESION N/A 04/20/2014   Procedure: LYSIS OF ADHESION;  Surgeon: Eldred Manges, MD;  Location: [facility];  Service: Gynecology;  Laterality: N/A;  . THYROIDECTOMY    . tubal ligation    . TUBAL LIGATION    . WISDOM TOOTH EXTRACTION      FAMILY HISTORY: Family History  Problem Relation Age of Onset  . Hypertension Mother   . Cancer Mother        uterine  . Cancer Father   . Cancer Brother        stomach; 15  . Early death Brother   . Stroke Maternal Uncle   . Heart disease Maternal Uncle   . Food Allergy Daughter   . Allergic rhinitis Daughter   . Allergic rhinitis Daughter   . Angioedema Neg Hx   . Asthma Neg Hx   . Eczema Neg Hx   . Immunodeficiency Neg Hx   . Urticaria Neg Hx     SOCIAL HISTORY: Social History   Socioeconomic History  . Marital status: Married    Spouse name: Not on file  . Number of children: Not on file  . Years of education: Not on file  . Highest education level: Not on file  Social Needs  . Financial resource strain: Not on file  . Food insecurity - worry: Not on file    . Food insecurity - inability: Not on file  . Transportation needs - medical: Not on file  . Transportation needs - non-medical: Not on file  Occupational History  . Not on file  Tobacco Use  . Smoking status: Never Smoker  . Smokeless tobacco: Never Used  Substance and Sexual Activity  . Alcohol use: Yes    Alcohol/week: 0.0 oz    Comment: socially; every 3-4 months  . Drug use: No  . Sexual activity: Yes    Birth control/protection: Surgical  Other Topics Concern  . Not on file  Social History Narrative  . Not on file      PHYSICAL EXAM  Vitals:   12/03/17 0744  BP: (!) 144/86  Pulse: 77  Weight: 265 lb (120.2 kg)  Height: 5\' 4"  (1.626 m)   Body mass index is 45.49 kg/m.  Generalized: Well developed, in no acute distress   Neck circumference 15. 5 ' , Mallampati grade 4, no significant red prognathia noted.  No TMJ click.  Neurological examination  Mentation: Alert oriented to time, place, history taking. Follows all commands speech and language fluent Cranial nerve II-XII: sense of taste and smell are intact.  Pupils were equal round reactive to light. Extraocular movements were full, visual field were full on confrontational test.  Facial sensation and strength were normal. Uvula and tongue midline, no fasciculations or tremor. . Head turning and shoulder shrug  were normal and symmetric. Motor: symmetric motor tone is noted throughout.  Sensory: intact to soft touch, pin prick and vibration.  Coordination: deferred.  Gait and station: Gait is normal.  DIAGNOSTIC DATA (LABS, IMAGING, TESTING) - I reviewed patient records, labs, notes, testing and imaging myself where available. CPAP download.  Dr. Hassell Done revisited with the patient in preparation for a gastric sleeve procedure.   Lab Results  Component Value Date   WBC 10.6 (H) 10/09/2017   HGB 12.0 10/09/2017   HCT 36.2 10/09/2017   MCV 76.5 (L) 10/09/2017   PLT 360 10/09/2017      Component  Value Date/Time   NA 141 10/09/2017 1544   K 3.7 10/09/2017 1544   CL 107 10/09/2017 1544   CO2 29 10/09/2017 1544   GLUCOSE 114 (H) 10/09/2017 1544   BUN 15 10/09/2017 1544   CREATININE 1.00 10/09/2017 1544   CREATININE 1.01 10/10/2011 0914   CALCIUM 8.6 (L) 10/09/2017 1544   PROT 7.5 10/10/2011 0914   ALBUMIN 4.0 10/10/2011 0914   AST 11 10/10/2011 0914   ALT 11 10/10/2011 0914   ALKPHOS 65 10/10/2011 0914   BILITOT 0.4 10/10/2011 0914   GFRNONAA >60 10/09/2017 1544   GFRAA >60 10/09/2017 1544   Lab Results  Component Value Date   CHOL 166 10/10/2011   HDL 45 10/10/2011   LDLCALC 113 (H) 10/10/2011   TRIG 41 10/10/2011   CHOLHDL 3.7 10/10/2011   Lab Results  Component Value Date   HGBA1C 6.2 (H) 10/10/2011   No results found for: VITAMINB12 Lab Results  Component Value Date   TSH 1.672 10/10/2011      ASSESSMENT AND PLAN:   1. OSA on CPAP poor compliance at 50% by time, settings are comfortable, new mask ( fitted by Shirlean Mylar ) is working well . The patient will need to adjust humidifier and   I will ask to remove the EPR setting , or decrease EPR to 1 cm from 3 cm.   2. Asthma, frequent bronchitis, sinusitis and other upper airway irritation has been present especially over the winter months.  The patient attributed this to the CPAP use, but it is possible that her to dry, she has been cleaning her machine routinely with vinegar water and the face mask with mild soap.  Going to invest in a soaking machine.  3.  Obesity, the patient has had losing weight after thyroidectomy.  She is a thyroid cancer survivor.  Has been seen by [facility] surgery's Dr. Hassell Done in preparation for gastric sleeve procedure, planned for spring 2019. It is well possible that the significant weight loss the use of CPAP becomes redundant. HTN and DM will be better controlled.    Rv after surgery , 3-6 month post sleeve procedure. HST to evaluate if apnea is resolved or still present.   Larey Seat, MD   12/03/2017, 8:09 AM [facility] [address], [place], [place] [address] [phone]

## 2017-12-09 ENCOUNTER — Encounter: Payer: Self-pay | Admitting: Skilled Nursing Facility1

## 2017-12-09 ENCOUNTER — Encounter: Payer: BC Managed Care – PPO | Attending: Surgery | Admitting: Skilled Nursing Facility1

## 2017-12-09 DIAGNOSIS — Z713 Dietary counseling and surveillance: Secondary | ICD-10-CM | POA: Insufficient documentation

## 2017-12-09 DIAGNOSIS — E088 Diabetes mellitus due to underlying condition with unspecified complications: Secondary | ICD-10-CM

## 2017-12-09 DIAGNOSIS — Z6841 Body Mass Index (BMI) 40.0 and over, adult: Secondary | ICD-10-CM | POA: Insufficient documentation

## 2017-12-09 NOTE — Progress Notes (Signed)
Pre-Op Assessment Visit:  Pre-Operative Sleeve Gastrectomy Surgery  Medical Nutrition Therapy:  Appt start time: 7:30 End time:  8:35  Patient was seen on 12/09/2017 for Pre-Operative Nutrition Assessment. Assessment and letter of approval faxed to [facility] Bariatric Surgery Program coordinator on 12/09/2017.   Pt states she has always struggled with constipation and consistently feels bloated and struggles with feeling exhausted. Pt states she checks her blood sugar every morning: 121-127 with an A1C of 6.9.   Pt expectation of surgery: To lose weight  Start weight at [facility]: 266.2 BMI: 45.69  24 hr Dietary Recall: First Meal: fast food: chicken biscuit or granola bar and smoothie or cereal Snack: chips Second Meal: fast food salad or cafeteria  Snack: peanuts and granola bars Third Meal: fast food Snack: cheddar popcorn  Beverages: unsweet tea, decaf coffee with cream, water, juice, gatorade  Encouraged to engage in 150 minutes of moderate physical activity including cardiovascular and weight baring weekly  Handouts given during visit include:  . Pre-Op Goals . Bariatric Surgery Protein Shakes During the appointment today the following Pre-Op Goals were reviewed with the patient: . Maintain or lose weight as instructed by your surgeon . Make healthy food choices . Begin to limit portion sizes . Limited concentrated sugars and fried foods . Keep fat/sugar in the single digits per serving on             food labels . Practice CHEWING your food  (aim for 30 chews per bite or until applesauce consistency) . Practice not drinking 15 minutes before, during, and 30 minutes after each meal/snack . Avoid all carbonated beverages  . Avoid/limit caffeinated beverages  . Avoid all sugar-sweetened beverages . Consume 3 meals per day; eat every 3-5 hours . Make a list of non-food related activities . Aim for 64-100 ounces of FLUID daily  . Aim for at least 60-80 grams of  PROTEIN daily . Look for a liquid protein source that contain ?15 g protein and ?5 g carbohydrate  (ex: shakes, drinks, shots)  -Follow diet recommendations listed below   Energy and Macronutrient Recomendations: Calories: 1500 Carbohydrate: 170 Protein: 112 Fat: 42  Demonstrated degree of understanding via:  Teach Back  Teaching Method Utilized:  Visual Auditory Hands on  Barriers to learning/adherence to lifestyle change: Thyroid dysfunction   Patient to call the Nutrition and Diabetes Education Services to enroll in Pre-Op and Post-Op Nutrition Education when surgery date is scheduled.

## 2017-12-11 ENCOUNTER — Encounter: Payer: Self-pay | Admitting: Neurology

## 2017-12-11 NOTE — Patient Instructions (Signed)
Preparation for bariatric surgery.

## 2017-12-11 NOTE — Progress Notes (Signed)
The visit was rescheduled for the following day at 8 AM. CD

## 2018-01-06 ENCOUNTER — Ambulatory Visit (INDEPENDENT_AMBULATORY_CARE_PROVIDER_SITE_OTHER): Payer: BC Managed Care – PPO

## 2018-01-06 ENCOUNTER — Other Ambulatory Visit: Payer: Self-pay | Admitting: Podiatry

## 2018-01-06 ENCOUNTER — Encounter: Payer: Self-pay | Admitting: Podiatry

## 2018-01-06 ENCOUNTER — Ambulatory Visit: Payer: BC Managed Care – PPO | Admitting: Podiatry

## 2018-01-06 DIAGNOSIS — G629 Polyneuropathy, unspecified: Secondary | ICD-10-CM | POA: Diagnosis not present

## 2018-01-06 DIAGNOSIS — M79671 Pain in right foot: Secondary | ICD-10-CM

## 2018-01-06 DIAGNOSIS — E1142 Type 2 diabetes mellitus with diabetic polyneuropathy: Secondary | ICD-10-CM | POA: Diagnosis not present

## 2018-01-06 DIAGNOSIS — M722 Plantar fascial fibromatosis: Secondary | ICD-10-CM

## 2018-01-06 DIAGNOSIS — M79672 Pain in left foot: Principal | ICD-10-CM

## 2018-01-06 MED ORDER — GABAPENTIN 100 MG PO CAPS: 100.0000 mg | ORAL_CAPSULE | Freq: Two times a day (BID) | ORAL | 3 refills | Status: DC

## 2018-01-06 NOTE — Progress Notes (Signed)
She presents today for follow-up of pain to the plantar aspect of her heel.  The foot still hurts the foot hurts more today than it did before pain is 8 out of 10 today as she refers to her right heel.  She says been going on for the past month I am getting sharp pains in my arch originally my boot was helping but now it is not.  She goes on to state that she has burning in her forefoot bilaterally.  She is a type II diabetic with a current capillary blood sugar of 117 mg/dL this morning and hemoglobin A1c of a 6.8.  Objective: Vital signs are stable alert and oriented x3 pulses are palpable.  Neurologic sensorium is intact deep tendon reflexes are intact muscle strength symmetrical bilateral.  Orthopedic evaluation demonstrates pain on palpation medial calcaneal tubercle of the right heel.  No open lesions or wounds.  Assessment: Plantar fasciitis right.  Some diabetic peripheral neuropathy.  Plan: Discussed etiology pathology conservative versus surgical therapy start her on 100 mg of gabapentin at nighttime to be moved up to twice daily within the next week or 2.  She understands this is amenable to it.  After sterile Betadine skin prep injected 20 mg of Kenalog 5 mg Marcaine to the point of maximal tenderness of the right heel.  Tolerated this procedure well without complications.  I will follow-up with her in 1 month.  She does have problems taking medication she went approximately.

## 2018-02-10 ENCOUNTER — Ambulatory Visit: Payer: BC Managed Care – PPO | Admitting: Podiatry

## 2018-02-16 ENCOUNTER — Ambulatory Visit: Payer: Self-pay | Admitting: Registered"

## 2018-02-23 ENCOUNTER — Ambulatory Visit: Payer: Self-pay | Admitting: Skilled Nursing Facility1

## 2018-03-03 ENCOUNTER — Other Ambulatory Visit: Payer: Self-pay

## 2018-03-03 ENCOUNTER — Ambulatory Visit: Payer: BC Managed Care – PPO | Admitting: Podiatry

## 2018-03-03 ENCOUNTER — Encounter: Payer: Self-pay | Admitting: Podiatry

## 2018-03-03 DIAGNOSIS — K219 Gastro-esophageal reflux disease without esophagitis: Secondary | ICD-10-CM | POA: Insufficient documentation

## 2018-03-03 DIAGNOSIS — K5730 Diverticulosis of large intestine without perforation or abscess without bleeding: Secondary | ICD-10-CM | POA: Insufficient documentation

## 2018-03-03 DIAGNOSIS — M7671 Peroneal tendinitis, right leg: Secondary | ICD-10-CM | POA: Diagnosis not present

## 2018-03-03 DIAGNOSIS — E0781 Sick-euthyroid syndrome: Secondary | ICD-10-CM | POA: Insufficient documentation

## 2018-03-03 DIAGNOSIS — M722 Plantar fascial fibromatosis: Secondary | ICD-10-CM | POA: Diagnosis not present

## 2018-03-03 DIAGNOSIS — M1990 Unspecified osteoarthritis, unspecified site: Secondary | ICD-10-CM | POA: Insufficient documentation

## 2018-03-04 NOTE — Progress Notes (Signed)
She presents today for follow-up of plantar fasciitis bilaterally she states they have been both hurting so badly and she states these orthotics to start doing anything for me as she refers orthotics made by an orthopedic group previously.  States that she cannot wear them any longer because the have to large of the heel on them and that her heel slides out of her shoes.  Objective: Vital signs are stable she is alert and oriented x3.  Pulses are palpable.  She has pain on palpation medial calcaneal tubercles bilateral.  Pulses remain palpable neurologic risk sensorium is still diminished.  She has tenderness on palpation fifth metatarsal of the right foot  Assessment: Cannot rule out peroneal tendinitis but she also has fifth metatarsal base metatarsalgia more than likely due to lateral compensation from her plantar fasciitis right foot.  Plan: Discussed etiology pathology conservative versus surgical therapies.  At this point after sterile Betadine skin prep I injected 10 mg of Kenalog 5 mg of Marcaine to the fifth metatarsal base and then injected 20 mg Kenalog 5 mg of Marcaine to the medial aspect of the right heel as well.  She will follow-up with Liliane Channel to have a new pair of orthotics made.  One that we use a intrinsic heel post and be low enough to fit into her dress shoes.  She like her previous orthotics from [facility]

## 2018-03-19 ENCOUNTER — Ambulatory Visit (INDEPENDENT_AMBULATORY_CARE_PROVIDER_SITE_OTHER): Payer: BC Managed Care – PPO | Admitting: Orthotics

## 2018-03-19 DIAGNOSIS — M722 Plantar fascial fibromatosis: Secondary | ICD-10-CM | POA: Diagnosis not present

## 2018-03-19 DIAGNOSIS — M7671 Peroneal tendinitis, right leg: Secondary | ICD-10-CM

## 2018-03-19 NOTE — Progress Notes (Signed)
Patient came into today to be cast for [employer] [facility]. Upon recommendation of Dr. Milinda Pointer Patient presents with lateral border and heel pain RIGHT, Pes Planus, plantar F Goals are to offload lateral border via 3* RF/FF valgus intrinsic post (valg kirby); as well as good dress f/o sulcus lenght Plan vendor [facility]

## 2018-03-30 ENCOUNTER — Encounter: Payer: BC Managed Care – PPO | Attending: Surgery | Admitting: Registered"

## 2018-03-30 DIAGNOSIS — Z6841 Body Mass Index (BMI) 40.0 and over, adult: Secondary | ICD-10-CM | POA: Insufficient documentation

## 2018-03-30 DIAGNOSIS — E669 Obesity, unspecified: Secondary | ICD-10-CM

## 2018-03-30 DIAGNOSIS — Z713 Dietary counseling and surveillance: Secondary | ICD-10-CM | POA: Insufficient documentation

## 2018-03-30 DIAGNOSIS — E119 Type 2 diabetes mellitus without complications: Secondary | ICD-10-CM

## 2018-04-01 NOTE — Progress Notes (Signed)
  Pre-Operative Nutrition Class:  Appt start time: 8:15 End time:  9:15.  Patient was seen on 03/30/2018 for Pre-Operative Bariatric Surgery Education at [facility].   Surgery date: 04/14/2018 Surgery type: Sleeve Start weight at [facility]: 266.2 Weight today: 271.4   Samples given per MNT protocol. Patient educated on appropriate usage: Bariatric Advantage Multivitamin Lot # [id number] Exp: 07/2019  Bariatric Advantage Calcium Citrate Lot # [id number] Exp: 08/03/2018  Renee Pain Protein Shake Lot # [id number] Exp: 08/14/2018  The following the learning objectives were met by the patient during this course:  Identify Pre-Op Dietary Goals and will begin 2 weeks pre-operatively  Identify appropriate sources of fluids and proteins   State protein recommendations and appropriate sources pre and post-operatively  Identify Post-Operative Dietary Goals and will follow for 2 weeks post-operatively  Identify appropriate multivitamin and calcium sources  Describe the need for physical activity post-operatively and will follow MD recommendations  State when to call healthcare provider regarding medication questions or post-operative complications  Handouts given during class include:  Pre-Op Bariatric Surgery Diet Handout  Protein Shake Handout  Post-Op Bariatric Surgery Nutrition Handout  [facility] Program Information Flyer  Support Group Information Flyer  [facility] Bariatric Supplements Price List  Follow-Up Plan: Patient will follow-up at [facility] 2 weeks post operatively for diet advancement per MD.

## 2018-04-02 NOTE — H&P (Signed)
Chief Complaint:  Morbid obesity BMI 46  History of Present Illness:  Dana Vasquez is an 52 y.o. female who was seen in our office in January regarding bariatric surgery.  She had previously started a workup in 2013.  Her prior surgeries include a ruptured ectopic pregnancy and hysterectomy.  She is followed by Dr. Bryon Lions.  She denied any significant GERD but on upper GI she was found to have a tiny sliding hiatal hernia.  No reflux was seen.  She uses a CPAP for obstructive sleep apnea.  She has diet controlled diabetes and her recent hemoglobin A1c was 6.4.  She is excited about her prospects with a sleeve gastrectomy and was seen and evaluated in the office June 6.  Her questions were answered and she is prepared for surgery.  Past Medical History:  Diagnosis Date  . Allergy   . Anemia   . Anxiety    no meds  . Arthritis    knees - otc med prn  . Asthma    allergy related - rarely uses inhaler  . Bruises easily   . Cancer ([place])    1995;thyroid cancer  . Depression    no meds  . Diabetes mellitus without complication (HCC)    borderline - no meds  . Ectopic pregnancy   . Euthyroid   . Fatigue   . Fibromyalgia   . GERD (gastroesophageal reflux disease)   . Headache(784.0)    otc med prn - last one 02/2014  . History of blood transfusion 2000   In [place], [place] at [facility] - ? 4 units transfused  . Hypertension   . Hypothyroidism   . Knee pain   . Sleep apnea    uses CPAP  . SVD (spontaneous vaginal delivery)    x 3  . Thyroid disease    thyroidectomy 1995    Past Surgical History:  Procedure Laterality Date  . ABDOMINAL HYSTERECTOMY N/A 04/20/2014   Procedure: Total ABDOMINAL HYSTERECTOMY partial right salpingectomy;  Surgeon: Eldred Manges, MD;  Location: [facility];  Service: Gynecology;  Laterality: N/A;  . BREATH TEK H PYLORI  09/18/2011   Procedure: BREATH TEK H PYLORI;  Surgeon: Pedro Earls, MD;  Location: [facility];  Service: General;   Laterality: N/A;  . BUNIONECTOMY     left;2015  . COLONOSCOPY    . DILATION AND CURETTAGE OF UTERUS  1988   endometriosis  . fallopian tubes removed     left tube removed per patient - laparotomy  . LYSIS OF ADHESION N/A 04/20/2014   Procedure: LYSIS OF ADHESION;  Surgeon: Eldred Manges, MD;  Location: [facility];  Service: Gynecology;  Laterality: N/A;  . THYROIDECTOMY    . tubal ligation    . TUBAL LIGATION    . WISDOM TOOTH EXTRACTION      No current facility-administered medications for this encounter.    Current Outpatient Medications  Medication Sig Dispense Refill  . gabapentin (NEURONTIN) 100 MG capsule Take 1 capsule (100 mg total) by mouth 2 (two) times daily. 60 capsule 3  . Levothyroxine Sodium 137 MCG CAPS Take 125 mcg by mouth daily before breakfast.     . losartan (COZAAR) 25 MG tablet Take 25 mg by mouth daily.    . montelukast (SINGULAIR) 10 MG tablet Take 10 mg by mouth daily as needed.     . naproxen (NAPROSYN) 500 MG tablet Take 1 tablet (500 mg total) by mouth 2 (two) times daily. (Patient  taking differently: Take 500 mg by mouth 2 (two) times daily as needed. ) 30 tablet 0  . PROAIR HFA 108 (90 Base) MCG/ACT inhaler as needed.  5   Amoxicillin; Nitrofurantoin monohyd macro; Breo ellipta [fluticasone furoate-vilanterol]; Dexilant [dexlansoprazole]; and Meloxicam Family History  Problem Relation Age of Onset  . Hypertension Mother   . Cancer Mother        uterine  . Cancer Father   . Cancer Brother        stomach; 58  . Early death Brother   . Stroke Maternal Uncle   . Heart disease Maternal Uncle   . Food Allergy Daughter   . Allergic rhinitis Daughter   . Allergic rhinitis Daughter   . Angioedema Neg Hx   . Asthma Neg Hx   . Eczema Neg Hx   . Immunodeficiency Neg Hx   . Urticaria Neg Hx    Social History:   reports that she has never smoked. She has never used smokeless tobacco. She reports that she drinks alcohol. She reports that she does not use  drugs.   REVIEW OF SYSTEMS : Negative except for see her problem list.  It is otherwise positive for arthritis in the right knee for which she receives steroid and gel injections.  She also has plantar fasciitis on the right foot  Physical Exam:   Last menstrual period 04/07/2014. There is no height or weight on file to calculate BMI.  Gen:  WDWN African-American female NAD  Neurological: Alert and oriented to person, place, and time. Motor and sensory function is grossly intact  Head: Normocephalic and atraumatic.  Eyes: Conjunctivae are normal. Pupils are equal, round, and reactive to light. No scleral icterus.  Neck: Normal range of motion. Neck supple. No tracheal deviation or thyromegaly present.  Cardiovascular:  SR without murmurs or gallops.  No carotid bruits Breast:  Not examined Respiratory: Effort normal.  No respiratory distress. No chest wall tenderness. Breath sounds normal.  No wheezes, rales or rhonchi.  Abdomen:  Obese nontender GU:  Not examined Musculoskeletal: Normal range of motion. Extremities are nontender. No cyanosis, edema or clubbing noted Lymphadenopathy: No cervical, preauricular, postauricular or axillary adenopathy is present Skin: Skin is warm and dry. No rash noted. No diaphoresis. No erythema. No pallor. Pscyh: Normal mood and affect. Behavior is normal. Judgment and thought content normal.   LABORATORY RESULTS: No results found for this or any previous visit (from the past 48 hour(s)).   RADIOLOGY RESULTS: No results found.  Problem List: Patient Active Problem List   Diagnosis Date Noted  . Euthyroid thyroiditis 03/03/2018  . Arthritis 03/03/2018  . Diverticulosis of colon without diverticulitis 03/03/2018  . Gastroesophageal reflux disease 03/03/2018  . Morbid obesity with body mass index (BMI) of 45.0 to 49.9 in adult ([place]) 12/03/2017  . Moderate persistent asthma without complication 40/98/1191  . Encounter for general adult medical  examination without abnormal findings 12/12/2016  . 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 Glomerular disorders in diseases classified elsewhere 12/12/2016  . Other long term (current) drug therapy 12/12/2016  . Allergic rhinitis 06/07/2016  . Asthma with acute exacerbation 06/07/2016  . Chronic kidney disease, stage II (mild) 04/28/2016  . Acute recurrent pansinusitis 04/03/2016  . Cough, persistent 04/03/2016  . Hoarseness 04/03/2016  . Sickle cell trait ([place]) 03/28/2016  . Obesity hypoventilation syndrome ([place]) 02/29/2016  . Obstructive sleep apnea 02/29/2016  .  Diabetes mellitus due to underlying condition with complication, without long-term current use of insulin ([place]) 02/29/2016  . Fibroid, uterine 04/20/2014  . Fibroids 04/19/2014  . Type 2 diabetes mellitus with diabetic chronic kidney disease ([place]) 04/19/2014  . Obesity, Class III, BMI 40-49.9 (morbid obesity) ([place]) 02/12/2012  . Menorrhagia 02/12/2012  . Hx of ectopic pregnancy 02/12/2012  . Plantar fasciitis 05/29/2011    Assessment & Plan: Morbid obesity BMI 46.2 with diabetes.  Plan sleeve gastrectomy    Matt B. Hassell Done, MD, [facility] [phone] beeper [phone]  04/02/2018 9:42 AM

## 2018-04-09 ENCOUNTER — Other Ambulatory Visit: Payer: BC Managed Care – PPO | Admitting: Orthotics

## 2018-04-10 ENCOUNTER — Encounter (HOSPITAL_COMMUNITY): Payer: Self-pay

## 2018-04-10 NOTE — Patient Instructions (Addendum)
Dana Vasquez  04/10/2018   Your procedure is scheduled on: 04-14-18  Report to [facility] Main  Entrance  Report to admitting at 1200 PM    Call this number if you have problems the morning of surgery [phone]   Remember: Do not eat food :After 600 pm night before surgery.Marland KitchenMORNING OF SURGERY DRINK:  1SHAKE BEFORE YOU LEAVE HOME, DRINK ALL OF THE SHAKE AT ONE TIME.   NO SOLID FOOD AFTER 600 PM THE NIGHT BEFORE YOUR SURGERY. YOU MAY DRINK CLEAR FLUIDS. THE SHAKE YOU DRINK BEFORE YOU LEAVE HOME WILL BE THE LAST FLUIDS YOU DRINK BEFORE SURGERY DRINK SHAKE AT 1100 AM   PAIN IS EXPECTED AFTER SURGERY AND WILL NOT BE COMPLETELY ELIMINATED. AMBULATION AND TYLENOL WILL HELP REDUCE INCISIONAL AND GAS PAIN. MOVEMENT IS KEY!  YOU ARE EXPECTED TO BE OUT OF BED WITHIN 4 HOURS OF ADMISSION TO YOUR PATIENT ROOM.  SITTING IN THE RECLINER THROUGHOUT THE DAY IS IMPORTANT FOR DRINKING FLUIDS AND MOVING GAS THROUGHOUT THE GI TRACT.  COMPRESSION STOCKINGS SHOULD BE WORN Dana Vasquez UNLESS YOU ARE WALKING.   INCENTIVE SPIROMETER SHOULD BE USED EVERY HOUR WHILE AWAKE TO DECREASE POST-OPERATIVE COMPLICATIONS SUCH AS PNEUMONIA.  WHEN DISCHARGED HOME, IT IS IMPORTANT TO CONTINUE TO WALK EVERY HOUR AND USE THE INCENTIVE SPIROMETER EVERY HOUR.          CLEAR LIQUID DIET   Foods Allowed                                                                     Foods Excluded  Coffee and tea, regular and decaf                             liquids that you cannot  Plain Jell-O in any flavor                                             see through such as: Fruit ices (not with fruit pulp)                                     milk, soups, orange juice  Iced Popsicles                                    All solid food Carbonated beverages, regular and diet                                    Cranberry, grape and apple juices Sports drinks like Gatorade Lightly seasoned  clear broth or consume(fat free) Sugar, honey syrup  Sample Menu Breakfast  Lunch                                     Supper Cranberry juice                    Beef broth                            Chicken broth Jell-O                                     Grape juice                           Apple juice Coffee or tea                        Jell-O                                      Popsicle                                                Coffee or tea                        Coffee or tea  _____________________________________________________________________   Take these medicines the morning of surgery with A SIP OF WATER: SYNTHROID, PROAIR INHALER AND BRING INHALER WITH YOU DO NOT TAKE ANY DIABETIC MEDICATIONS DAY OF YOUR SURGERY                               You may not have any metal on your body including hair pins and              piercings  Do not wear jewelry, make-up, lotions, powders or perfumes, deodorant             Do not wear nail polish.  Do not shave  48 hours prior to surgery.              Men may shave face and neck.   Do not bring valuables to the hospital. [name].  Contacts, dentures or bridgework may not be worn into surgery.  Leave suitcase in the car. After surgery it may be brought to your room.                   Please read over the following fact sheets you were given: _____________________________________________________________________   [facility] - Preparing for Surgery Before surgery, you can play an important role.  Because skin is not sterile, your skin needs to be as free of germs as possible.  You can reduce the number of germs on your skin by washing with CHG (chlorahexidine gluconate) soap before surgery.  CHG is an antiseptic cleaner which kills germs and bonds with the skin to continue  killing germs even after washing. Please DO NOT use if you have an allergy to  CHG or antibacterial soaps.  If your skin becomes reddened/irritated stop using the CHG and inform your nurse when you arrive at Short Stay. Do not shave (including legs and underarms) for at least 48 hours prior to the first CHG shower.  You may shave your face/neck. Please follow these instructions carefully:  1.  Shower with CHG Soap the night before surgery and the  morning of Surgery.  2.  If you choose to wash your hair, wash your hair first as usual with your  normal  shampoo.  3.  After you shampoo, rinse your hair and body thoroughly to remove the  shampoo.                           4.  Use CHG as you would any other liquid soap.  You can apply chg directly  to the skin and wash                       Gently with a scrungie or clean washcloth.  5.  Apply the CHG Soap to your body ONLY FROM THE NECK DOWN.   Do not use on face/ open                           Wound or open sores. Avoid contact with eyes, ears mouth and genitals (private parts).                       Wash face,  Genitals (private parts) with your normal soap.             6.  Wash thoroughly, paying special attention to the area where your surgery  will be performed.  7.  Thoroughly rinse your body with warm water from the neck down.  8.  DO NOT shower/wash with your normal soap after using and rinsing off  the CHG Soap.                9.  Pat yourself dry with a clean towel.            10.  Wear clean pajamas.            11.  Place clean sheets on your bed the night of your first shower and do not  sleep with pets. Day of Surgery : Do not apply any lotions/deodorants the morning of surgery.  Please wear clean clothes to the hospital/surgery center.  FAILURE TO FOLLOW THESE INSTRUCTIONS MAY RESULT IN THE CANCELLATION OF YOUR SURGERY PATIENT SIGNATURE_________________________________  NURSE SIGNATURE__________________________________  ________________________________________________________________________   Dana Vasquez  An incentive spirometer is a tool that can help keep your lungs clear and active. This tool measures how well you are filling your lungs with each breath. Taking long deep breaths may help reverse or decrease the chance of developing breathing (pulmonary) problems (especially infection) following:  A long period of time when you are unable to move or be active. BEFORE THE PROCEDURE   If the spirometer includes an indicator to show your best effort, your nurse or respiratory therapist will set it to a desired goal.  If possible, sit up straight or lean slightly forward. Try not to slouch.  Hold the incentive spirometer in an upright position. INSTRUCTIONS FOR USE  1. Sit  on the edge of your bed if possible, or sit up as far as you can in bed or on a chair. 2. Hold the incentive spirometer in an upright position. 3. Breathe out normally. 4. Place the mouthpiece in your mouth and seal your lips tightly around it. 5. Breathe in slowly and as deeply as possible, raising the piston or the ball toward the top of the column. 6. Hold your breath for 3-5 seconds or for as long as possible. Allow the piston or ball to fall to the bottom of the column. 7. Remove the mouthpiece from your mouth and breathe out normally. 8. Rest for a few seconds and repeat Steps 1 through 7 at least 10 times every 1-2 hours when you are awake. Take your time and take a few normal breaths between deep breaths. 9. The spirometer may include an indicator to show your best effort. Use the indicator as a goal to work toward during each repetition. 10. After each set of 10 deep breaths, practice coughing to be sure your lungs are clear. If you have an incision (the cut made at the time of surgery), support your incision when coughing by placing a pillow or rolled up towels firmly against it. Once you are able to get out of bed, walk around indoors and cough well. You may stop using the incentive spirometer when  instructed by your caregiver.  RISKS AND COMPLICATIONS  Take your time so you do not get dizzy or light-headed.  If you are in pain, you may need to take or ask for pain medication before doing incentive spirometry. It is harder to take a deep breath if you are having pain. AFTER USE  Rest and breathe slowly and easily.  It can be helpful to keep track of a log of your progress. Your caregiver can provide you with a simple table to help with this. If you are using the spirometer at home, follow these instructions: [place] IF:   You are having difficultly using the spirometer.  You have trouble using the spirometer as often as instructed.  Your pain medication is not giving enough relief while using the spirometer.  You develop fever of 100.5 F (38.1 C) or higher. SEEK IMMEDIATE MEDICAL CARE IF:   You cough up bloody sputum that had not been present before.  You develop fever of 102 F (38.9 C) or greater.  You develop worsening pain at or near the incision site. MAKE SURE YOU:   Understand these instructions.  Will watch your condition.  Will get help right away if you are not doing well or get worse. Document Released: 02/24/2007 Document Revised: 01/06/2012 Document Reviewed: 04/27/2007 ExitCare Patient Information 2014 ExitCare, [place].   ________________________________________________________________________  WHAT IS A BLOOD TRANSFUSION? Blood Transfusion Information  A transfusion is the replacement of blood or some of its parts. Blood is made up of multiple cells which provide different functions.  Red blood cells carry oxygen and are used for blood loss replacement.  White blood cells fight against infection.  Platelets control bleeding.  Plasma helps clot blood.  Other blood products are available for specialized needs, such as hemophilia or other clotting disorders. BEFORE THE TRANSFUSION  Who gives blood for transfusions?   Healthy  volunteers who are fully evaluated to make sure their blood is safe. This is blood bank blood. Transfusion therapy is the safest it has ever been in the practice of medicine. Before blood is taken from a donor, a  complete history is taken to make sure that person has no history of diseases nor engages in risky social behavior (examples are intravenous drug use or sexual activity with multiple partners). The donor's travel history is screened to minimize risk of transmitting infections, such as malaria. The donated blood is tested for signs of infectious diseases, such as HIV and hepatitis. The blood is then tested to be sure it is compatible with you in order to minimize the chance of a transfusion reaction. If you or a relative donates blood, this is often done in anticipation of surgery and is not appropriate for emergency situations. It takes many days to process the donated blood. RISKS AND COMPLICATIONS Although transfusion therapy is very safe and saves many lives, the main dangers of transfusion include:   Getting an infectious disease.  Developing a transfusion reaction. This is an allergic reaction to something in the blood you were given. Every precaution is taken to prevent this. The decision to have a blood transfusion has been considered carefully by your caregiver before blood is given. Blood is not given unless the benefits outweigh the risks. AFTER THE TRANSFUSION  Right after receiving a blood transfusion, you will usually feel much better and more energetic. This is especially true if your red blood cells have gotten low (anemic). The transfusion raises the level of the red blood cells which carry oxygen, and this usually causes an energy increase.  The nurse administering the transfusion will monitor you carefully for complications. HOME CARE INSTRUCTIONS  No special instructions are needed after a transfusion. You may find your energy is better. Speak with your caregiver about any  limitations on activity for underlying diseases you may have. SEEK MEDICAL CARE IF:   Your condition is not improving after your transfusion.  You develop redness or irritation at the intravenous (IV) site. SEEK IMMEDIATE MEDICAL CARE IF:  Any of the following symptoms occur over the next 12 hours:  Shaking chills.  You have a temperature by mouth above 102 F (38.9 C), not controlled by medicine.  Chest, back, or muscle pain.  People around you feel you are not acting correctly or are confused.  Shortness of breath or difficulty breathing.  Dizziness and fainting.  You get a rash or develop hives.  You have a decrease in urine output.  Your urine turns a dark color or changes to pink, red, or brown. Any of the following symptoms occur over the next 10 days:  You have a temperature by mouth above 102 F (38.9 C), not controlled by medicine.  Shortness of breath.  Weakness after normal activity.  The white part of the eye turns yellow (jaundice).  You have a decrease in the amount of urine or are urinating less often.  Your urine turns a dark color or changes to pink, red, or brown. Document Released: 10/11/2000 Document Revised: 01/06/2012 Document Reviewed: 05/30/2008 [facility] Patient Information 2014 [place], [place].  _______________________________________________________________________

## 2018-04-10 NOTE — Progress Notes (Signed)
CHEST XRAY 11-17-17 Epic  EKG 11-17-17 EPIC

## 2018-04-13 ENCOUNTER — Other Ambulatory Visit: Payer: BC Managed Care – PPO | Admitting: Orthotics

## 2018-04-13 ENCOUNTER — Encounter (HOSPITAL_COMMUNITY): Payer: Self-pay | Admitting: *Deleted

## 2018-04-13 ENCOUNTER — Encounter (HOSPITAL_COMMUNITY)
Admission: RE | Admit: 2018-04-13 | Discharge: 2018-04-13 | Disposition: A | Discharge status: Home / Self Care | Payer: BC Managed Care – PPO | Source: Ambulatory Visit | Attending: Surgery | Admitting: Surgery

## 2018-04-13 ENCOUNTER — Other Ambulatory Visit: Payer: Self-pay

## 2018-04-13 ENCOUNTER — Ambulatory Visit: Payer: BC Managed Care – PPO | Admitting: Orthotics

## 2018-04-13 DIAGNOSIS — M722 Plantar fascial fibromatosis: Secondary | ICD-10-CM

## 2018-04-13 DIAGNOSIS — M7671 Peroneal tendinitis, right leg: Secondary | ICD-10-CM

## 2018-04-13 LAB — CBC WITH DIFFERENTIAL/PLATELET
Basophils Absolute: 0 10*3/uL (ref 0.0–0.1)
Basophils Relative: 0 %
EOS PCT: 1 %
Eosinophils Absolute: 0.1 10*3/uL (ref 0.0–0.7)
HCT: 35.6 % — ABNORMAL LOW (ref 36.0–46.0)
Hemoglobin: 11.6 g/dL — ABNORMAL LOW (ref 12.0–15.0)
LYMPHS PCT: 23 %
Lymphs Abs: 2 10*3/uL (ref 0.7–4.0)
MCH: 24.5 pg — AB (ref 26.0–34.0)
MCHC: 32.6 g/dL (ref 30.0–36.0)
MCV: 75.1 fL — AB (ref 78.0–100.0)
MONO ABS: 0.6 10*3/uL (ref 0.1–1.0)
MONOS PCT: 8 %
Neutro Abs: 5.7 10*3/uL (ref 1.7–7.7)
Neutrophils Relative %: 68 %
Platelets: 380 10*3/uL (ref 150–400)
RBC: 4.74 MIL/uL (ref 3.87–5.11)
RDW: 15.8 % — AB (ref 11.5–15.5)
WBC: 8.4 10*3/uL (ref 4.0–10.5)

## 2018-04-13 LAB — ABO/RH: ABO/RH(D): O POS

## 2018-04-13 LAB — COMPREHENSIVE METABOLIC PANEL
ALK PHOS: 67 U/L (ref 38–126)
ALT: 18 U/L (ref 14–54)
AST: 17 U/L (ref 15–41)
Albumin: 4.1 g/dL (ref 3.5–5.0)
Anion gap: 7 (ref 5–15)
BUN: 22 mg/dL — AB (ref 6–20)
CALCIUM: 9.7 mg/dL (ref 8.9–10.3)
CHLORIDE: 111 mmol/L (ref 101–111)
CO2: 28 mmol/L (ref 22–32)
CREATININE: 1.07 mg/dL — AB (ref 0.44–1.00)
GFR calc Af Amer: >60 mL/min (ref >60)
GFR calc non Af Amer: 59 mL/min — ABNORMAL LOW (ref >60)
Glucose, Bld: 102 mg/dL — ABNORMAL HIGH (ref 65–99)
Potassium: 4.4 mmol/L (ref 3.5–5.1)
Sodium: 146 mmol/L — ABNORMAL HIGH (ref 135–145)
Total Bilirubin: 1 mg/dL (ref 0.3–1.2)
Total Protein: 8.1 g/dL (ref 6.5–8.1)

## 2018-04-13 LAB — HEMOGLOBIN A1C
HEMOGLOBIN A1C: 6 % — AB (ref 4.8–5.6)
Mean Plasma Glucose: 125.5 mg/dL

## 2018-04-13 MED ORDER — BUPIVACAINE LIPOSOME 1.3 % IJ SUSP
20.0000 mL | Freq: Once | INTRAMUSCULAR | Status: DC
  Filled 2018-04-13: qty 20

## 2018-04-13 NOTE — Progress Notes (Signed)
Picked up her f/o but didn't bring appropriate shoes to accommodate.  Check arch height, length and advised to come back if needed any adjustments.

## 2018-04-14 ENCOUNTER — Encounter (HOSPITAL_COMMUNITY)
Admission: RE | Disposition: A | Discharge status: Home / Self Care | Payer: Self-pay | Source: Ambulatory Visit | Attending: Surgery

## 2018-04-14 ENCOUNTER — Inpatient Hospital Stay (HOSPITAL_COMMUNITY): Payer: BC Managed Care – PPO | Admitting: Anesthesiology

## 2018-04-14 ENCOUNTER — Encounter (HOSPITAL_COMMUNITY): Payer: Self-pay | Admitting: Anesthesiology

## 2018-04-14 ENCOUNTER — Inpatient Hospital Stay (HOSPITAL_COMMUNITY)
Admission: RE | Admit: 2018-04-14 | Discharge: 2018-04-16 | DRG: 620 | Disposition: A | Discharge status: Home / Self Care | Payer: BC Managed Care – PPO | Source: Ambulatory Visit | Attending: Surgery | Admitting: Surgery

## 2018-04-14 ENCOUNTER — Other Ambulatory Visit: Payer: Self-pay

## 2018-04-14 DIAGNOSIS — J45909 Unspecified asthma, uncomplicated: Secondary | ICD-10-CM | POA: Diagnosis present

## 2018-04-14 DIAGNOSIS — E1122 Type 2 diabetes mellitus with diabetic chronic kidney disease: Secondary | ICD-10-CM | POA: Diagnosis present

## 2018-04-14 DIAGNOSIS — N182 Chronic kidney disease, stage 2 (mild): Secondary | ICD-10-CM | POA: Diagnosis present

## 2018-04-14 DIAGNOSIS — G4733 Obstructive sleep apnea (adult) (pediatric): Secondary | ICD-10-CM | POA: Diagnosis present

## 2018-04-14 DIAGNOSIS — E039 Hypothyroidism, unspecified: Secondary | ICD-10-CM | POA: Diagnosis present

## 2018-04-14 DIAGNOSIS — K660 Peritoneal adhesions (postprocedural) (postinfection): Secondary | ICD-10-CM | POA: Diagnosis present

## 2018-04-14 DIAGNOSIS — K219 Gastro-esophageal reflux disease without esophagitis: Secondary | ICD-10-CM | POA: Diagnosis present

## 2018-04-14 DIAGNOSIS — Z85850 Personal history of malignant neoplasm of thyroid: Secondary | ICD-10-CM | POA: Diagnosis not present

## 2018-04-14 DIAGNOSIS — Z7951 Long term (current) use of inhaled steroids: Secondary | ICD-10-CM | POA: Diagnosis not present

## 2018-04-14 DIAGNOSIS — Z6841 Body Mass Index (BMI) 40.0 and over, adult: Secondary | ICD-10-CM

## 2018-04-14 DIAGNOSIS — Z79899 Other long term (current) drug therapy: Secondary | ICD-10-CM

## 2018-04-14 DIAGNOSIS — C49A2 Gastrointestinal stromal tumor of stomach: Secondary | ICD-10-CM | POA: Diagnosis present

## 2018-04-14 DIAGNOSIS — I129 Hypertensive chronic kidney disease with stage 1 through stage 4 chronic kidney disease, or unspecified chronic kidney disease: Secondary | ICD-10-CM | POA: Diagnosis present

## 2018-04-14 DIAGNOSIS — Z9884 Bariatric surgery status: Secondary | ICD-10-CM

## 2018-04-14 DIAGNOSIS — M797 Fibromyalgia: Secondary | ICD-10-CM | POA: Diagnosis present

## 2018-04-14 HISTORY — PX: LAPAROSCOPIC GASTRIC SLEEVE RESECTION: SHX5895

## 2018-04-14 LAB — HEMOGLOBIN AND HEMATOCRIT, BLOOD
HCT: 34.6 % — ABNORMAL LOW (ref 36.0–46.0)
Hemoglobin: 11.4 g/dL — ABNORMAL LOW (ref 12.0–15.0)

## 2018-04-14 LAB — GLUCOSE, CAPILLARY: GLUCOSE-CAPILLARY: 128 mg/dL — AB (ref 65–99)

## 2018-04-14 LAB — TYPE AND SCREEN
ABO/RH(D): O POS
Antibody Screen: NEGATIVE

## 2018-04-14 SURGERY — GASTRECTOMY, SLEEVE, LAPAROSCOPIC
Anesthesia: General | Site: Abdomen

## 2018-04-14 MED ORDER — HYDROMORPHONE HCL 1 MG/ML IJ SOLN
INTRAMUSCULAR | Status: AC
  Administered 2018-04-14: 0.25 mg via INTRAVENOUS
  Filled 2018-04-14: qty 1

## 2018-04-14 MED ORDER — PREMIER PROTEIN SHAKE
2.0000 [oz_av] | ORAL | Status: DC
  Administered 2018-04-16 (×5): 2 [oz_av] via ORAL

## 2018-04-14 MED ORDER — MIDAZOLAM HCL 2 MG/2ML IJ SOLN
INTRAMUSCULAR | Status: AC
  Filled 2018-04-14: qty 2

## 2018-04-14 MED ORDER — HEPARIN SODIUM (PORCINE) 5000 UNIT/ML IJ SOLN
5000.0000 [IU] | INTRAMUSCULAR | Status: AC
  Administered 2018-04-14: 5000 [IU] via SUBCUTANEOUS
  Filled 2018-04-14: qty 1

## 2018-04-14 MED ORDER — FAMOTIDINE IN NACL 20-0.9 MG/50ML-% IV SOLN
20.0000 mg | Freq: Two times a day (BID) | INTRAVENOUS | Status: DC
  Administered 2018-04-14 – 2018-04-16 (×4): 20 mg via INTRAVENOUS
  Filled 2018-04-14 (×4): qty 50

## 2018-04-14 MED ORDER — MORPHINE SULFATE (PF) 2 MG/ML IV SOLN
1.0000 mg | INTRAVENOUS | Status: DC | PRN
  Administered 2018-04-14: 1 mg via INTRAVENOUS
  Filled 2018-04-14 (×2): qty 1

## 2018-04-14 MED ORDER — LIDOCAINE 2% (20 MG/ML) 5 ML SYRINGE
INTRAMUSCULAR | Status: DC | PRN
  Administered 2018-04-14: 1.5 mg/kg/h via INTRAVENOUS

## 2018-04-14 MED ORDER — HYDROMORPHONE HCL 1 MG/ML IJ SOLN
0.2500 mg | INTRAMUSCULAR | Status: DC | PRN
  Administered 2018-04-14 (×2): 0.25 mg via INTRAVENOUS

## 2018-04-14 MED ORDER — HEPARIN SODIUM (PORCINE) 5000 UNIT/ML IJ SOLN
5000.0000 [IU] | Freq: Three times a day (TID) | INTRAMUSCULAR | Status: DC
  Administered 2018-04-14 – 2018-04-16 (×5): 5000 [IU] via SUBCUTANEOUS
  Filled 2018-04-14 (×5): qty 1

## 2018-04-14 MED ORDER — ACETAMINOPHEN 160 MG/5ML PO SOLN
650.0000 mg | Freq: Four times a day (QID) | ORAL | Status: DC
  Administered 2018-04-14 – 2018-04-16 (×7): 650 mg via ORAL
  Filled 2018-04-14 (×9): qty 20.3

## 2018-04-14 MED ORDER — ACETAMINOPHEN 500 MG PO TABS
1000.0000 mg | ORAL_TABLET | ORAL | Status: AC
  Administered 2018-04-14: 1000 mg via ORAL
  Filled 2018-04-14: qty 2

## 2018-04-14 MED ORDER — FENTANYL CITRATE (PF) 100 MCG/2ML IJ SOLN
INTRAMUSCULAR | Status: DC | PRN
  Administered 2018-04-14 (×4): 50 ug via INTRAVENOUS

## 2018-04-14 MED ORDER — OXYCODONE HCL 5 MG/5ML PO SOLN
5.0000 mg | ORAL | Status: DC | PRN
  Administered 2018-04-14 – 2018-04-15 (×3): 5 mg via ORAL
  Administered 2018-04-15 (×3): 10 mg via ORAL
  Administered 2018-04-15: 5 mg via ORAL
  Administered 2018-04-16 (×2): 10 mg via ORAL
  Filled 2018-04-14: qty 5
  Filled 2018-04-14 (×2): qty 10
  Filled 2018-04-14: qty 5
  Filled 2018-04-14 (×4): qty 10
  Filled 2018-04-14: qty 5

## 2018-04-14 MED ORDER — DEXAMETHASONE SODIUM PHOSPHATE 10 MG/ML IJ SOLN
INTRAMUSCULAR | Status: AC
  Filled 2018-04-14: qty 1

## 2018-04-14 MED ORDER — MEPERIDINE HCL 50 MG/ML IJ SOLN: 6.2500 mg | INTRAMUSCULAR | Status: DC | PRN

## 2018-04-14 MED ORDER — ACETAMINOPHEN 10 MG/ML IV SOLN: 1000.0000 mg | Freq: Once | INTRAVENOUS | Status: DC | PRN

## 2018-04-14 MED ORDER — SODIUM CHLORIDE 0.9 % IV SOLN
2.0000 g | INTRAVENOUS | Status: AC
  Administered 2018-04-14: 2 g via INTRAVENOUS
  Filled 2018-04-14: qty 2

## 2018-04-14 MED ORDER — SUGAMMADEX SODIUM 500 MG/5ML IV SOLN
INTRAVENOUS | Status: DC | PRN
  Administered 2018-04-14: 300 mg via INTRAVENOUS

## 2018-04-14 MED ORDER — PROPOFOL 10 MG/ML IV BOLUS
INTRAVENOUS | Status: DC | PRN
  Administered 2018-04-14: 200 mg via INTRAVENOUS

## 2018-04-14 MED ORDER — ROCURONIUM BROMIDE 10 MG/ML (PF) SYRINGE
PREFILLED_SYRINGE | INTRAVENOUS | Status: DC | PRN
  Administered 2018-04-14: 40 mg via INTRAVENOUS
  Administered 2018-04-14: 10 mg via INTRAVENOUS

## 2018-04-14 MED ORDER — DEXAMETHASONE SODIUM PHOSPHATE 4 MG/ML IJ SOLN: 4.0000 mg | INTRAMUSCULAR | Status: DC

## 2018-04-14 MED ORDER — CELECOXIB 200 MG PO CAPS
400.0000 mg | ORAL_CAPSULE | ORAL | Status: AC
  Administered 2018-04-14: 400 mg via ORAL
  Filled 2018-04-14: qty 2

## 2018-04-14 MED ORDER — KCL IN DEXTROSE-NACL 20-5-0.45 MEQ/L-%-% IV SOLN
INTRAVENOUS | Status: DC
  Administered 2018-04-14 – 2018-04-15 (×2): via INTRAVENOUS
  Administered 2018-04-15 (×2): 125 mL/h via INTRAVENOUS
  Filled 2018-04-14 (×5): qty 1000

## 2018-04-14 MED ORDER — APREPITANT 40 MG PO CAPS
40.0000 mg | ORAL_CAPSULE | ORAL | Status: AC
  Administered 2018-04-14: 40 mg via ORAL
  Filled 2018-04-14: qty 1

## 2018-04-14 MED ORDER — CHLORHEXIDINE GLUCONATE CLOTH 2 % EX PADS: 6.0000 | MEDICATED_PAD | Freq: Once | CUTANEOUS | Status: DC

## 2018-04-14 MED ORDER — LIDOCAINE 2% (20 MG/ML) 5 ML SYRINGE
INTRAMUSCULAR | Status: DC | PRN
  Administered 2018-04-14: 100 mg via INTRAVENOUS

## 2018-04-14 MED ORDER — BUPIVACAINE LIPOSOME 1.3 % IJ SUSP
20.0000 mL | Freq: Once | INTRAMUSCULAR | Status: DC
  Filled 2018-04-14: qty 20

## 2018-04-14 MED ORDER — SODIUM CHLORIDE 0.9 % IJ SOLN
INTRAMUSCULAR | Status: AC
  Filled 2018-04-14: qty 10

## 2018-04-14 MED ORDER — HYDRALAZINE HCL 20 MG/ML IJ SOLN: 10.0000 mg | INTRAMUSCULAR | Status: DC | PRN

## 2018-04-14 MED ORDER — ONDANSETRON HCL 4 MG/2ML IJ SOLN
4.0000 mg | INTRAMUSCULAR | Status: DC | PRN
  Administered 2018-04-15 – 2018-04-16 (×3): 4 mg via INTRAVENOUS
  Filled 2018-04-14 (×3): qty 2

## 2018-04-14 MED ORDER — DEXAMETHASONE SODIUM PHOSPHATE 10 MG/ML IJ SOLN
INTRAMUSCULAR | Status: DC | PRN
  Administered 2018-04-14: 10 mg via INTRAVENOUS

## 2018-04-14 MED ORDER — PROMETHAZINE HCL 25 MG/ML IJ SOLN
INTRAMUSCULAR | Status: AC
  Filled 2018-04-14: qty 1

## 2018-04-14 MED ORDER — FENTANYL CITRATE (PF) 100 MCG/2ML IJ SOLN
INTRAMUSCULAR | Status: AC
  Filled 2018-04-14: qty 2

## 2018-04-14 MED ORDER — LACTATED RINGERS IV SOLN
INTRAVENOUS | Status: DC
  Administered 2018-04-14 (×2): via INTRAVENOUS

## 2018-04-14 MED ORDER — ONDANSETRON HCL 4 MG/2ML IJ SOLN
INTRAMUSCULAR | Status: DC | PRN
  Administered 2018-04-14: 4 mg via INTRAVENOUS

## 2018-04-14 MED ORDER — PROPOFOL 10 MG/ML IV BOLUS
INTRAVENOUS | Status: AC
  Filled 2018-04-14: qty 20

## 2018-04-14 MED ORDER — SUGAMMADEX SODIUM 500 MG/5ML IV SOLN
INTRAVENOUS | Status: AC
  Filled 2018-04-14: qty 5

## 2018-04-14 MED ORDER — PROMETHAZINE HCL 25 MG/ML IJ SOLN
6.2500 mg | INTRAMUSCULAR | Status: DC | PRN
  Administered 2018-04-14: 6.25 mg via INTRAVENOUS

## 2018-04-14 MED ORDER — GABAPENTIN 300 MG PO CAPS
300.0000 mg | ORAL_CAPSULE | ORAL | Status: AC
  Administered 2018-04-14: 300 mg via ORAL
  Filled 2018-04-14: qty 1

## 2018-04-14 MED ORDER — MIDAZOLAM HCL 5 MG/5ML IJ SOLN
INTRAMUSCULAR | Status: DC | PRN
  Administered 2018-04-14 (×2): 1 mg via INTRAVENOUS

## 2018-04-14 MED ORDER — SUCCINYLCHOLINE CHLORIDE 200 MG/10ML IV SOSY
PREFILLED_SYRINGE | INTRAVENOUS | Status: DC | PRN
  Administered 2018-04-14: 120 mg via INTRAVENOUS

## 2018-04-14 MED ORDER — SCOPOLAMINE 1 MG/3DAYS TD PT72
1.0000 | MEDICATED_PATCH | TRANSDERMAL | Status: DC
  Administered 2018-04-14: 1.5 mg via TRANSDERMAL
  Filled 2018-04-14: qty 1

## 2018-04-14 MED ORDER — ONDANSETRON HCL 4 MG/2ML IJ SOLN
INTRAMUSCULAR | Status: AC
  Filled 2018-04-14: qty 2

## 2018-04-14 SURGICAL SUPPLY — 62 items
ADH SKN CLS APL DERMABOND .7 (GAUZE/BANDAGES/DRESSINGS) ×1
APPLICATOR COTTON TIP 6IN STRL (MISCELLANEOUS) ×1 IMPLANT
APPLIER CLIP 5 13 M/L LIGAMAX5 (MISCELLANEOUS)
APPLIER CLIP ROT 10 11.4 M/L (STAPLE)
APPLIER CLIP ROT 13.4 12 LRG (CLIP)
APR CLP LRG 13.4X12 ROT 20 MLT (CLIP)
APR CLP MED LRG 11.4X10 (STAPLE)
APR CLP MED LRG 5 ANG JAW (MISCELLANEOUS)
BLADE SURG 15 STRL LF DISP TIS (BLADE) ×1 IMPLANT
BLADE SURG 15 STRL SS (BLADE) ×2
CABLE HIGH FREQUENCY MONO STRZ (ELECTRODE) ×2 IMPLANT
CLIP APPLIE 5 13 M/L LIGAMAX5 (MISCELLANEOUS) IMPLANT
CLIP APPLIE ROT 10 11.4 M/L (STAPLE) IMPLANT
CLIP APPLIE ROT 13.4 12 LRG (CLIP) IMPLANT
DERMABOND ADVANCED (GAUZE/BANDAGES/DRESSINGS) ×1
DERMABOND ADVANCED .7 DNX12 (GAUZE/BANDAGES/DRESSINGS) IMPLANT
DEVICE SUT QUICK LOAD TK 5 (STAPLE) IMPLANT
DEVICE SUT TI-KNOT TK 5X26 (MISCELLANEOUS) IMPLANT
DEVICE SUTURE ENDOST 10MM (ENDOMECHANICALS) IMPLANT
DISSECTOR BLUNT TIP ENDO 5MM (MISCELLANEOUS) IMPLANT
ELECT REM PT RETURN 15FT ADLT (MISCELLANEOUS) ×2 IMPLANT
GAUZE SPONGE 4X4 12PLY STRL (GAUZE/BANDAGES/DRESSINGS) ×1 IMPLANT
GLOVE BIOGEL M 8.0 STRL (GLOVE) ×2 IMPLANT
GOWN STRL REUS W/TWL XL LVL3 (GOWN DISPOSABLE) ×8 IMPLANT
GRASPER SUT TROCAR 14GX15 (MISCELLANEOUS) ×2 IMPLANT
HANDLE STAPLE EGIA 4 XL (STAPLE) ×2 IMPLANT
HOVERMATT SINGLE USE (MISCELLANEOUS) ×2 IMPLANT
KIT BASIN OR (CUSTOM PROCEDURE TRAY) ×2 IMPLANT
MARKER SKIN DUAL TIP RULER LAB (MISCELLANEOUS) ×2 IMPLANT
NDL SPNL 22GX3.5 QUINCKE BK (NEEDLE) ×1 IMPLANT
NEEDLE SPNL 22GX3.5 QUINCKE BK (NEEDLE) ×2 IMPLANT
PACK UNIVERSAL I (CUSTOM PROCEDURE TRAY) ×2 IMPLANT
RELOAD STAPLE 45 PURP MED/THCK (STAPLE) IMPLANT
RELOAD TRI 45 ART MED THCK BLK (STAPLE) ×2 IMPLANT
RELOAD TRI 45 ART MED THCK PUR (STAPLE) IMPLANT
RELOAD TRI 60 ART MED THCK BLK (STAPLE) ×2 IMPLANT
RELOAD TRI 60 ART MED THCK PUR (STAPLE) ×3 IMPLANT
SCISSORS LAP 5X45 EPIX DISP (ENDOMECHANICALS) ×1 IMPLANT
SET IRRIG TUBING LAPAROSCOPIC (IRRIGATION / IRRIGATOR) ×2 IMPLANT
SHEARS HARMONIC ACE PLUS 45CM (MISCELLANEOUS) ×2 IMPLANT
SLEEVE ADV FIXATION 5X100MM (TROCAR) ×4 IMPLANT
SLEEVE GASTRECTOMY 36FR VISIGI (MISCELLANEOUS) ×2 IMPLANT
SOLUTION ANTI FOG 6CC (MISCELLANEOUS) ×2 IMPLANT
SPONGE LAP 18X18 RF (DISPOSABLE) ×2 IMPLANT
STAPLER VISISTAT 35W (STAPLE) ×2 IMPLANT
SUT MNCRL AB 4-0 PS2 18 (SUTURE) ×2 IMPLANT
SUT SURGIDAC NAB ES-9 0 48 120 (SUTURE) IMPLANT
SUT VIC AB 4-0 SH 18 (SUTURE) ×2 IMPLANT
SUT VICRYL 0 TIES 12 18 (SUTURE) ×2 IMPLANT
SYR 10ML ECCENTRIC (SYRINGE) ×2 IMPLANT
SYR 20CC LL (SYRINGE) ×2 IMPLANT
SYR 50ML LL SCALE MARK (SYRINGE) ×2 IMPLANT
TOWEL OR 17X26 10 PK STRL BLUE (TOWEL DISPOSABLE) ×4 IMPLANT
TOWEL OR NON WOVEN STRL DISP B (DISPOSABLE) ×2 IMPLANT
TRAY FOLEY MTR SLVR 16FR STAT (SET/KITS/TRAYS/PACK) IMPLANT
TROCAR ADV FIXATION 5X100MM (TROCAR) ×2 IMPLANT
TROCAR BLADELESS 15MM (ENDOMECHANICALS) ×2 IMPLANT
TROCAR BLADELESS OPT 5 100 (ENDOMECHANICALS) ×2 IMPLANT
TUBE CALIBRATION LAPBAND (TUBING) IMPLANT
TUBING CONNECTING 10 (TUBING) ×2 IMPLANT
TUBING ENDO SMARTCAP (MISCELLANEOUS) ×2 IMPLANT
TUBING INSUF HEATED (TUBING) ×2 IMPLANT

## 2018-04-14 NOTE — Interval H&P Note (Signed)
History and Physical Interval Note:  04/14/2018 1:54 PM  Dana Vasquez  has presented today for surgery, with the diagnosis of MORBID  OBESITY  The various methods of treatment have been discussed with the patient and family. After consideration of risks, benefits and other options for treatment, the patient has consented to  Procedure(s): LAPAROSCOPIC GASTRIC SLEEVE RESECTION WITH UPPER ENDO AND ERAS PATHWAY (N/A) as a surgical intervention .  The patient's history has been reviewed, patient examined, no change in status, stable for surgery.  I have reviewed the patient's chart and labs.  Questions were answered to the patient's satisfaction.     Pedro Earls

## 2018-04-14 NOTE — Anesthesia Procedure Notes (Addendum)
Procedure Name: Intubation Date/Time: 04/14/2018 2:14 PM Performed by: Dione Booze, CRNA Pre-anesthesia Checklist: Patient identified, Emergency Drugs available, Suction available and Patient being monitored Patient Re-evaluated:Patient Re-evaluated prior to induction Oxygen Delivery Method: Circle system utilized Preoxygenation: Pre-oxygenation with 100% oxygen Induction Type: IV induction Laryngoscope Size: Mac and 4 Grade View: Grade II Tube type: Oral Tube size: 7.5 mm Number of attempts: 1 Airway Equipment and Method: Stylet Placement Confirmation: ETT inserted through vocal cords under direct vision,  positive ETCO2 and breath sounds checked- equal and bilateral Secured at: 22 cm Tube secured with: Tape Dental Injury: Teeth and Oropharynx as per pre-operative assessment  Comments: Anterior cords, cricoid needle.

## 2018-04-14 NOTE — Transfer of Care (Signed)
Immediate Anesthesia Transfer of Care Note  Patient: Dana Vasquez  Procedure(s) Performed: LAPAROSCOPIC GASTRIC SLEEVE RESECTION WITH UPPER ENDO AND ERAS PATHWAY (N/A Abdomen)  Patient Location: PACU  Anesthesia Type:General  Level of Consciousness: awake, alert  and patient cooperative  Airway & Oxygen Therapy: Patient Spontanous Breathing and Patient connected to face mask oxygen  Post-op Assessment: Report given to RN and Post -op Vital signs reviewed and stable  Post vital signs: Reviewed and stable  Last Vitals:  Vitals Value Taken Time  BP 162/92 04/14/2018  4:00 PM  Temp    Pulse 70 04/14/2018  4:03 PM  Resp 23 04/14/2018  4:03 PM  SpO2 100 % 04/14/2018  4:03 PM  Vitals shown include unvalidated device data.  Last Pain:  Vitals:   04/14/18 1238  TempSrc:   PainSc: 0-No pain         Complications: No apparent anesthesia complications

## 2018-04-14 NOTE — Op Note (Signed)
Name:  [name] MRN: [id number] Date of Surgery: 04/14/2018  Preop Diagnosis:  Morbid Obesity  Postop Diagnosis:  Morbid Obesity (Weight - 262, BMI - 45), S/P Gastric Sleeve resection  Procedure:  Upper endoscopy  (Intraoperative)  Surgeon:  Alphonsa Overall, M.D.  Anesthesia:  GET  Indications for procedure: Dana Vasquez is a 52 y.o. female whose primary care physician is Glendale Chard, MD and has completed a gastric sleeve resection today for weight loss by Dr. Hassell Done.  I am doing an intraoperative upper endoscopy to evaluate the gastric pouch after the sleeve gastrectomy.  Operative Note: The patient is under general anesthesia.  Dr. Hassell Done is laparoscoping the patient while I do an upper endoscopy to evaluate the stomach pouch.  With the patient intubated, I passed the Pentax upper endoscope without difficulty down the esophagus.  The esophagus was unremarkable.  The esophago-gastric junction was at 37 cm.    The mucosa of the stomach looked viable and the staple line was intact without bleeding.  I advanced the scope to the pylorus, but did not go through it.  While I insufflated the stomach pouch with air, Dr. Hassell Done  flooded the upper abdomen with saline to put the gastric pouch under saline.  There was no bubbling or evidence of a leak.  There was no evidence of narrowing of the pouch and the gastric sleeve looked tubular.  The scope was then withdrawn.  The esophagus was unremarkable and the patient tolerated the endoscopy without difficulty.  Alphonsa Overall, MD, [facility] Surgery Pager: [phone] Office phone:  [phone]

## 2018-04-14 NOTE — Anesthesia Postprocedure Evaluation (Signed)
Anesthesia Post Note  Patient: Dana Vasquez  Procedure(s) Performed: LAPAROSCOPIC GASTRIC SLEEVE RESECTION WITH UPPER ENDO AND ERAS PATHWAY (N/A Abdomen)     Patient location during evaluation: PACU Anesthesia Type: General Level of consciousness: sedated Pain management: pain level controlled Vital Signs Assessment: post-procedure vital signs reviewed and stable Respiratory status: spontaneous breathing Cardiovascular status: stable Postop Assessment: no apparent nausea or vomiting Anesthetic complications: no    Last Vitals:  Vitals:   04/14/18 1630 04/14/18 1645  BP: (!) 146/80 (!) 148/90  Pulse: (!) 56 (!) 57  Resp: 15 (!) 21  Temp:    SpO2: 100% 100%    Last Pain:  Vitals:   04/14/18 1630  TempSrc:   PainSc: 7    Pain Goal:                 Marieta Markov JR,JOHN Dynastee Brummell

## 2018-04-14 NOTE — Anesthesia Preprocedure Evaluation (Signed)
Anesthesia Evaluation  Patient identified by MRN, date of birth, ID band Patient awake    Reviewed: Allergy & Precautions, H&P , NPO status , Patient's Chart, lab work & pertinent test results, reviewed documented beta blocker date and time   History of Anesthesia Complications Negative for: history of anesthetic complications  Airway Mallampati: III  TM Distance: >3 FB Neck ROM: full    Dental no notable dental hx. (+) Teeth Intact   Pulmonary asthma , sleep apnea ,    Pulmonary exam normal breath sounds clear to auscultation       Cardiovascular Exercise Tolerance: Good hypertension, Pt. on medications Normal cardiovascular exam Rhythm:regular Rate:Normal     Neuro/Psych  Headaches, PSYCHIATRIC DISORDERS Anxiety Depression  Neuromuscular disease    GI/Hepatic GERD  Controlled,  Endo/Other  diabetesHypothyroidism Morbid obesity  Renal/GU      Musculoskeletal  (+) Fibromyalgia -  Abdominal (+) + obese,   Peds  Hematology  (+) anemia ,   Anesthesia Other Findings   Reproductive/Obstetrics                             Anesthesia Physical  Anesthesia Plan  ASA: III  Anesthesia Plan: General ETT   Post-op Pain Management:    Induction:   PONV Risk Score and Plan: 4 or greater and Ondansetron, Dexamethasone, Scopolamine patch - Pre-op and Midazolam  Airway Management Planned: Oral ETT  Additional Equipment:   Intra-op Plan:   Post-operative Plan: Extubation in OR  Informed Consent: I have reviewed the patients History and Physical, chart, labs and discussed the procedure including the risks, benefits and alternatives for the proposed anesthesia with the patient or authorized representative who has indicated his/her understanding and acceptance.   Dental Advisory Given  Plan Discussed with: CRNA and Surgeon  Anesthesia Plan Comments:         Anesthesia Quick  Evaluation

## 2018-04-14 NOTE — Op Note (Signed)
Surgeon: Kaylyn Lim, MD, FACS  Asst:  Alphonsa Overall, MD, FACS  Anes:  General endotracheal  Procedure: Laparoscopic sleeve gastrectomy and upper endoscopy  Diagnosis: Morbid obesity  Complications: None noted   EBL:   10 cc  Description of Procedure:  The patient was take to OR 4 and given general anesthesia.  The abdomen was prepped with Technicare and draped sterilely.  A timeout was performed.  Access to the abdomen was achieved with 5 mm Optiview through the left upper quadrant..  Following insufflation, the state of the abdomen was found to be with some adhesions to the from the falciform to the omentum and these were taken down.  Standard trocar placement was used including a 15 slightly to the right of the midline.  There was no dimple noted at the EG junction in the upper GI despite that the patient had no complaints of reflux..  The ViSiGi 36Fr tube was inserted to deflate the stomach and was pulled back into the esophagus.    The pylorus was identified and we measured 5 cm back and marked the antrum.  At that point we began dissection to take down the greater curvature of the stomach using the Harmonic scalpel.  This dissection was taken all the way up to the left crus.  Posterior attachments of the stomach were also taken down.    The ViSiGi tube was then passed into the antrum and suction applied so that it was snug along the lessor curvature.  The "crow's foot" or incisura was identified.  The sleeve gastrectomy was begun using the [employer] stapler beginning with a 4.5 cm black load with TRS followed by 6 cm black load with TRS followed by multiple purple load with TRS.Marland Kitchen  When the sleeve was complete the tube was taken off suction and insufflated briefly.  The tube was withdrawn.  Upper endoscopy was then performed by Dr. Lucia Gaskins and no bleeding or bubbles were noted..     The specimen was extracted through the 15 trocar site.  Wounds were infiltrated with Exparel placed  as a tap block on each lateral wall.  And closed with 4-0 Monocryl.  A 15 mm trocar was closed with a single 0 Vicryl placed with the endoscopic suturing device..   Dermabond was applied.     Matt B. Hassell Done, [place], [facility], [place]

## 2018-04-15 ENCOUNTER — Encounter (HOSPITAL_COMMUNITY): Payer: Self-pay | Admitting: Surgery

## 2018-04-15 LAB — CBC WITH DIFFERENTIAL/PLATELET
BASOS PCT: 0 %
Basophils Absolute: 0 10*3/uL (ref 0.0–0.1)
EOS PCT: 0 %
Eosinophils Absolute: 0 10*3/uL (ref 0.0–0.7)
HEMATOCRIT: 34.8 % — AB (ref 36.0–46.0)
Hemoglobin: 11.2 g/dL — ABNORMAL LOW (ref 12.0–15.0)
Lymphocytes Relative: 8 %
Lymphs Abs: 1.2 10*3/uL (ref 0.7–4.0)
MCH: 24.6 pg — ABNORMAL LOW (ref 26.0–34.0)
MCHC: 32.2 g/dL (ref 30.0–36.0)
MCV: 76.3 fL — ABNORMAL LOW (ref 78.0–100.0)
MONO ABS: 1.1 10*3/uL — AB (ref 0.1–1.0)
MONOS PCT: 7 %
NEUTROS ABS: 12.6 10*3/uL — AB (ref 1.7–7.7)
Neutrophils Relative %: 85 %
PLATELETS: 340 10*3/uL (ref 150–400)
RBC: 4.56 MIL/uL (ref 3.87–5.11)
RDW: 15.9 % — AB (ref 11.5–15.5)
WBC: 14.8 10*3/uL — ABNORMAL HIGH (ref 4.0–10.5)

## 2018-04-15 NOTE — Progress Notes (Signed)
Pt drank 3 cups of water today and ambulated 3-4 times.  Still complaining of pain, tried to keep pain meds on schedule today to help in decreasing pain. Pt is taking ice chips. Encouraged to ambulate and sip water.

## 2018-04-15 NOTE — Discharge Instructions (Signed)
° ° ° °GASTRIC BYPASS/SLEEVE ° Home Care Instructions ° ° These instructions are to help you care for yourself when you go home. ° °Call: If you have any problems. °• Call [phone] and ask for the surgeon on call °• If you need immediate help, come to the ER at [facility].  °• Tell the ER staff that you are a new post-op gastric bypass or gastric sleeve patient °  °Signs and symptoms to report: • Severe vomiting or nausea °o If you cannot keep down clear liquids for longer than 1 day, call your surgeon  °• Abdominal pain that does not get better after taking your pain medication °• Fever over 100.4° F with chills °• Heart beating over 100 beats a minute °• Shortness of breath at rest °• Chest pain °•  Redness, swelling, drainage, or foul odor at incision (surgical) sites °•  If your incisions open or pull apart °• Swelling or pain in calf (lower leg) °• Diarrhea (Loose bowel movements that happen often), frequent watery, uncontrolled bowel movements °• Constipation, (no bowel movements for 3 days) if this happens: Pick one °o Milk of Magnesia, 2 tablespoons by mouth, 3 times a day for 2 days if needed °o Stop taking Milk of Magnesia once you have a bowel movement °o Call your doctor if constipation continues °Or °o Miralax  (instead of Milk of Magnesia) following the label instructions °o Stop taking Miralax once you have a bowel movement °o Call your doctor if constipation continues °• Anything you think is not normal °  °Normal side effects after surgery: • Unable to sleep at night or unable to focus °• Irritability or moody °• Being tearful (crying) or depressed °These are common complaints, possibly related to your anesthesia medications that put you to sleep, stress of surgery, and change in lifestyle.  This usually goes away a few weeks after surgery.  If these feelings continue, call your primary care doctor. °  °Wound Care: You may have surgical glue, steri-strips, or staples over your incisions after  surgery °• Surgical glue:  Looks like a clear film over your incisions and will wear off a little at a time °• Steri-strips: Strips of tape over your incisions. You may notice a yellowish color on the skin under the steri-strips. This is used to make the   steri-strips stick better. Do not pull the steri-strips off - let them fall off °• Staples: Staples may be removed before you leave the hospital °o If you go home with staples, call [facility], [phone] at for an appointment with your surgeon’s nurse to have staples removed 10 days after surgery. °• Showering: You may shower two (2) days after your surgery unless your surgeon tells you differently °o Wash gently around incisions with warm soapy water, rinse well, and gently pat dry  °o No tub baths until staples are removed, steri-strips fall off or glue is gone.  °  °Medications: • Medications should be liquid or crushed if larger than the size of a dime °• Extended release pills (medication that release a little bit at a time through the day) should NOT be crushed or cut. (examples include XL, ER, DR, SR) °• Depending on the size and number of medications you take, you may need to space (take a few throughout the day)/change the time you take your medications so that you do not over-fill your pouch (smaller stomach) °• Make sure you follow-up with your primary care doctor to   make medication changes needed during rapid weight loss and life-style changes °• If you have diabetes, follow up with the doctor that orders your diabetes medication(s) within one week after surgery and check your blood sugar regularly. °• Do not drive while taking prescription pain medication  °• It is ok to take Tylenol by the bottle instructions with your pain medicine or instead of your pain medicine as needed.  DO NOT TAKE NSAIDS (EXAMPLES OF NSAIDS:  IBUPROFREN/ NAPROXEN)  °Diet:                    First 2 Weeks ° You will see the dietician t about two (2) weeks  after your surgery. The dietician will increase the types of foods you can eat if you are handling liquids well: °• If you have severe vomiting or nausea and cannot keep down clear liquids lasting longer than 1 day, call your surgeon @ ([phone]) °Protein Shake °• Drink at least 2 ounces of shake 5-6 times per day °• Each serving of protein shakes (usually 8 - 12 ounces) should have: °o 15 grams of protein  °o And no more than 5 grams of carbohydrate  °• Goal for protein each day: °o Men = 80 grams per day °o Women = 60 grams per day °• Protein powder may be added to fluids such as non-fat milk or Lactaid milk or unsweetened Soy/Almond milk (limit to 35 grams added protein powder per serving) ° °Hydration °• Slowly increase the amount of water and other clear liquids as tolerated (See Acceptable Fluids) °• Slowly increase the amount of protein shake as tolerated  °•  Sip fluids slowly and throughout the day.  Do not use straws. °• May use sugar substitutes in small amounts (no more than 6 - 8 packets per day; i.e. Splenda) ° °Fluid Goal °• The first goal is to drink at least 8 ounces of protein shake/drink per day (or as directed by the nutritionist); some examples of protein shakes are Syntrax Nectar, Adkins Advantage, EAS Edge HP, and Unjury. See handout from pre-op Bariatric Education Class: °o Slowly increase the amount of protein shake you drink as tolerated °o You may find it easier to slowly sip shakes throughout the day °o It is important to get your proteins in first °• Your fluid goal is to drink 64 - 100 ounces of fluid daily °o It may take a few weeks to build up to this °• 32 oz (or more) should be clear liquids  °And  °• 32 oz (or more) should be full liquids (see below for examples) °• Liquids should not contain sugar, caffeine, or carbonation ° °Clear Liquids: °• Water or Sugar-free flavored water (i.e. Fruit H2O, Propel) °• Decaffeinated coffee or tea (sugar-free) °• Crystal Lite, Wyler’s Lite,  Minute Maid Lite °• Sugar-free Jell-O °• Bouillon or broth °• Sugar-free Popsicle:   *Less than 20 calories each; Limit 1 per day ° °Full Liquids: °Protein Shakes/Drinks + 2 choices per day of other full liquids °• Full liquids must be: °o No More Than 15 grams of Carbs per serving  °o No More Than 3 grams of Fat per serving °• Strained low-fat cream soup (except Cream of Potato or Tomato) °• Non-Fat milk °• Fat-free Lactaid Milk °• Unsweetened Soy Or Unsweetened Almond Milk °• Low Sugar yogurt (Dannon Lite & Fit, Greek yogurt; Oikos Triple Zero; Chobani Simply 100; Yoplait 100 calorie Greek - No Fruit on the Bottom) ° °  °Vitamins   and Minerals • Start 1 day after surgery unless otherwise directed by your surgeon °• 2 Chewable Bariatric Specific Multivitamin / Multimineral Supplement with iron (Example: Bariatric Advantage Multi EA) °• Chewable Calcium with Vitamin D-3 °(Example: 3 Chewable Calcium Plus 600 with Vitamin D-3) °o Take 500 mg three (3) times a day for a total of 1500 mg each day °o Do not take all 3 doses of calcium at one time as it may cause constipation, and you can only absorb 500 mg  at a time  °o Do not mix multivitamins containing iron with calcium supplements; take 2 hours apart °• Menstruating women and those with a history of anemia (a blood disease that causes weakness) may need extra iron °o Talk with your doctor to see if you need more iron °• Do not stop taking or change any vitamins or minerals until you talk to your dietitian or surgeon °• Your Dietitian and/or surgeon must approve all vitamin and mineral supplements °  °Activity and Exercise: Limit your physical activity as instructed by your doctor.  It is important to continue walking at home.  During this time, use these guidelines: °• Do not lift anything greater than ten (10) pounds for at least two (2) weeks °• Do not go back to work or drive until your surgeon says you can °• You may have sex when you feel comfortable  °o It is  VERY important for female patients to use a reliable birth control method; fertility often increases after surgery  °o All hormonal birth control will be ineffective for 30 days after surgery due to medications given during surgery a barrier method must be used. °o Do not get pregnant for at least 18 months °• Start exercising as soon as your doctor tells you that you can °o Make sure your doctor approves any physical activity °• Start with a simple walking program °• Walk 5-15 minutes each day, 7 days per week.  °• Slowly increase until you are walking 30-45 minutes per day °Consider joining our BELT program. [phone] or email [email or web address] °  °Special Instructions Things to remember: °• Use your CPAP when sleeping if this applies to you ° °• [facility] has two free Bariatric Surgery Support Groups that meet monthly °o The 3rd Thursday of each month, 6 pm, [facility]  °o The 2nd Friday of each month, 11:45 am in the private dining room in the basement of [facility] °• It is very important to keep all follow up appointments with your surgeon, dietitian, primary care physician, and behavioral health practitioner °• Routine follow up schedule with your surgeon include appointments at 2-3 weeks, 6-8 weeks, 6 months, and 1 year at a minimum.  Your surgeon may request to see you more often.   °o After the first year, please follow up with your bariatric surgeon and dietitian at least once a year in order to maintain best weight loss results °[facility]: [phone] °[facility]: [phone] °Bariatric Nurse Coordinator: [phone] °  °   Reviewed and Endorsed  °by [place] Patient Education Committee, June, 2016 °Edits Approved: Aug, 2018 ° ° ° °

## 2018-04-15 NOTE — Plan of Care (Signed)

## 2018-04-15 NOTE — Progress Notes (Signed)
Pt. placed on CPAP for h/s, humidifier filled with s/w, using own nasal pillows and hose, added 2 lpm oxygen into circuit pt. tolerating well.

## 2018-04-15 NOTE — Progress Notes (Signed)
Pt not progressing with water.  C/o nausea and pain in RUQ.  Zofran given earlier.  IV infiltrated now so IV removed and will give Zofran when IV restarted.  Po pain meds given x 2 earlier by pt's nurse  but pt states they are not helping and is asking for something more.  Strongly encourged pt to get OOB and walk every 1-2 hrs.

## 2018-04-15 NOTE — Progress Notes (Signed)
Patient alert and oriented, Post op day 1.  Pt sitting on side of bed w/ family in room. Provided support and encouragement.  Encouraged pulmonary toilet, ambulation and small sips of liquids.  All questions answered.  Will continue to monitor.  Total 24 hr po intake from time of arrival to unit=  120cc

## 2018-04-15 NOTE — Progress Notes (Signed)
Patient ID: Dana Vasquez, female   DOB: 1966/02/10, 52 y.o.   MRN: [id number] [facility] Progress Note:   1 Day Post-Op  Subjective: Mental status is clear  Objective: Vital signs in last 24 hours: Temp:  [97.6 F (36.4 C)-98.9 F (37.2 C)] 98.7 F (37.1 C) (06/19 0639) Pulse Rate:  [56-77] 77 (06/19 0639) Resp:  [12-21] 18 (06/19 0639) BP: (95-174)/(49-95) 95/49 (06/19 0639) SpO2:  [94 %-100 %] 98 % (06/19 0945)  Intake/Output from previous day: 06/18 0701 - 06/19 0700 In: 2400.4 [P.O.:120; I.V.:2073.8; IV Piggyback:206.7] Out: 1425 [Urine:1400; Blood:25] Intake/Output this shift: No intake/output data recorded.  Physical Exam: Work of breathing is normal.  Taking liquids slowly and with some nausea. Incisions are ok  Lab Results:  Results for orders placed or performed during the hospital encounter of 04/14/18 (from the past 48 hour(s))  Glucose, capillary     Status: Abnormal   Collection Time: 04/14/18  4:01 PM  Result Value Ref Range   Glucose-Capillary 128 (H) 65 - 99 mg/dL   Comment 1 Document in Chart   Hemoglobin and hematocrit, blood     Status: Abnormal   Collection Time: 04/14/18  7:56 PM  Result Value Ref Range   Hemoglobin 11.4 (L) 12.0 - 15.0 g/dL   HCT 34.6 (L) 36.0 - 46.0 %    Comment: Performed at [facility], [place] [address], [place], [place] [address]  CBC WITH DIFFERENTIAL     Status: Abnormal   Collection Time: 04/15/18  5:01 AM  Result Value Ref Range   WBC 14.8 (H) 4.0 - 10.5 K/uL   RBC 4.56 3.87 - 5.11 MIL/uL   Hemoglobin 11.2 (L) 12.0 - 15.0 g/dL   HCT 34.8 (L) 36.0 - 46.0 %   MCV 76.3 (L) 78.0 - 100.0 fL   MCH 24.6 (L) 26.0 - 34.0 pg   MCHC 32.2 30.0 - 36.0 g/dL   RDW 15.9 (H) 11.5 - 15.5 %   Platelets 340 150 - 400 K/uL   Neutrophils Relative % 85 %   Neutro Abs 12.6 (H) 1.7 - 7.7 K/uL   Lymphocytes Relative 8 %   Lymphs Abs 1.2 0.7 - 4.0 K/uL   Monocytes Relative 7 %   Monocytes Absolute 1.1 (H) 0.1  - 1.0 K/uL   Eosinophils Relative 0 %   Eosinophils Absolute 0.0 0.0 - 0.7 K/uL   Basophils Relative 0 %   Basophils Absolute 0.0 0.0 - 0.1 K/uL    Comment: Performed at [facility], [place] [address], [place], [place] [address]    Radiology/Results: No results found.  Anti-infectives: Anti-infectives (From admission, onward)   Start     Dose/Rate Route Frequency Ordered Stop   04/14/18 1230  cefoTEtan (CEFOTAN) 2 g in sodium chloride 0.9 % 100 mL IVPB     2 g 200 mL/hr over 30 Minutes Intravenous On call to O.R. 04/14/18 1212 04/14/18 1433      Assessment/Plan: Problem List: Patient Active Problem List   Diagnosis Date Noted  . 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 Euthyroid thyroiditis 03/03/2018  . Arthritis 03/03/2018  . Diverticulosis of colon without diverticulitis 03/03/2018  . Gastroesophageal reflux disease 03/03/2018  . Morbid obesity with body mass index (BMI) of 45.0 to 49.9 in adult ([place]) 12/03/2017  . Moderate persistent asthma without complication 37/85/8850  . Encounter for general adult medical examination without abnormal findings 12/12/2016  . Hypertensive chronic kidney disease with stage  1 through stage 4 chronic kidney disease, or unspecified chronic kidney disease 12/12/2016  . Glomerular disorders in diseases classified elsewhere 12/12/2016  . Other long term (current) drug therapy 12/12/2016  . Allergic rhinitis 06/07/2016  . Asthma with acute exacerbation 06/07/2016  . Chronic kidney disease, stage II (mild) 04/28/2016  . Acute recurrent pansinusitis 04/03/2016  . Cough, persistent 04/03/2016  . Hoarseness 04/03/2016  . Sickle cell trait ([place]) 03/28/2016  . Obesity hypoventilation syndrome ([place]) 02/29/2016  . Obstructive sleep apnea 02/29/2016  . Diabetes mellitus due to underlying condition with complication, without long-term current use of insulin ([place]) 02/29/2016  . Fibroid, uterine 04/20/2014  .  Fibroids 04/19/2014  . Type 2 diabetes mellitus with diabetic chronic kidney disease ([place]) 04/19/2014  . Obesity, Class III, BMI 40-49.9 (morbid obesity) ([place]) 02/12/2012  . Menorrhagia 02/12/2012  . Hx of ectopic pregnancy 02/12/2012  . Plantar fasciitis 05/29/2011    Not ready for discharge today.   1 Day Post-Op    LOS: 1 day   Matt B. Hassell Done, MD, [facility] [phone] beeper [phone]  04/15/2018 2:43 PM

## 2018-04-16 LAB — CBC WITH DIFFERENTIAL/PLATELET
BASOS ABS: 0 10*3/uL (ref 0.0–0.1)
BASOS PCT: 0 %
EOS PCT: 0 %
Eosinophils Absolute: 0 10*3/uL (ref 0.0–0.7)
HCT: 32.5 % — ABNORMAL LOW (ref 36.0–46.0)
Hemoglobin: 10.5 g/dL — ABNORMAL LOW (ref 12.0–15.0)
Lymphocytes Relative: 22 %
Lymphs Abs: 2.5 10*3/uL (ref 0.7–4.0)
MCH: 24.8 pg — ABNORMAL LOW (ref 26.0–34.0)
MCHC: 32.3 g/dL (ref 30.0–36.0)
MCV: 76.7 fL — AB (ref 78.0–100.0)
MONO ABS: 1 10*3/uL (ref 0.1–1.0)
MONOS PCT: 9 %
Neutro Abs: 7.9 10*3/uL — ABNORMAL HIGH (ref 1.7–7.7)
Neutrophils Relative %: 69 %
PLATELETS: 306 10*3/uL (ref 150–400)
RBC: 4.24 MIL/uL (ref 3.87–5.11)
RDW: 16.1 % — AB (ref 11.5–15.5)
WBC: 11.5 10*3/uL — ABNORMAL HIGH (ref 4.0–10.5)

## 2018-04-16 NOTE — Progress Notes (Signed)
Discharge paperwork discussed with pt and family. They demonstrated understanding and pt was escorted to main lobby by wheelchair in stable condition.

## 2018-04-16 NOTE — Discharge Summary (Addendum)
Physician Discharge Summary  Patient ID: Dana Vasquez MRN: [id number] DOB/AGE: 1966/03/01 52 y.o.  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  Ready for discharge on PD 2.    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 rate 16, height 5\' 4"  (1.626 m), weight 119 kg (262 lb 5 oz), last menstrual period 04/07/2014, SpO2 99 %. Incisions OK.    Disposition: Discharge disposition: 01-Home or Self Care       Discharge Instructions    Ambulate hourly while awake   Complete by:  As directed    Call MD for:  difficulty breathing, headache or visual disturbances   Complete by:  As directed    Call MD for:  persistant dizziness or light-headedness   Complete by:  As directed    Call MD for:  persistant nausea and vomiting   Complete by:  As directed    Call MD for:  redness, tenderness, or signs of infection (pain, swelling, redness, odor or green/yellow discharge around incision site)   Complete by:  As directed    Call MD for:  severe uncontrolled pain   Complete by:  As directed    Call MD for:  temperature >101 F   Complete by:  As directed    Diet bariatric full liquid   Complete by:  As directed    Incentive spirometry   Complete by:  As directed    Perform hourly while awake     Allergies as of 04/16/2018      Reactions   Amoxicillin Other (See Comments)   Headache Has patient had a PCN  reaction causing immediate rash, facial/tongue/throat swelling, SOB or lightheadedness with hypotension: No Has patient had a PCN reaction causing severe rash involving mucus membranes or skin necrosis: No Has patient had a PCN reaction that required hospitalization: No Has patient had a PCN reaction occurring within the last 10 years: Unknown If all of the above answers are "NO", then may proceed with Cephalosporin use.   Nitrofurantoin Monohyd Macro Other (See Comments)   Pt stated she gets a bad yeast infection   Dexilant [dexlansoprazole]    "bad stomachache"   Meloxicam    Upset stomach and stomach pains      Medication List    STOP taking these medications   naproxen 500 MG tablet Commonly known as:  NAPROSYN     TAKE these medications   gabapentin 100 MG capsule Commonly known as:  NEURONTIN Take 1 capsule (100 mg total) by mouth 2 (two) times daily.   levothyroxine 150 MCG tablet Commonly known as:  SYNTHROID, LEVOTHROID Take 150 mcg by mouth daily before breakfast.   losartan 25 MG tablet Commonly known as:  COZAAR Take 25 mg by mouth daily. Notes to patient:  Monitor Blood Pressure Daily and keep a log for primary care physician.  You may need to make changes to your medications with rapid weight loss.  PROAIR HFA 108 (90 Base) MCG/ACT inhaler Generic drug:  albuterol Inhale 2 puffs into the lungs 4 (four) times daily as needed (for wheezing.).      Follow-up Information    [facility] Follow up on 04/29/2018.   Specialty:  General Surgery Why:  @ 10am w/ Dr. Johnathan Hausen Contact information: [address] [place] [address] [phone]        Johnathan Hausen, MD .   Specialty:  General Surgery Contact information: [address] [place] [address] [phone]           Signed: Pedro Earls 04/16/2018, 8:27 AM

## 2018-04-16 NOTE — Progress Notes (Signed)
Patient alert and oriented, pain is somewhat controlled. Has pushed back taking pain meds d/t "hurting drinking it".  Patient is tolerating small sips of fluids, advanced to protein shake.  Reviewed Gastric sleeve discharge instructions with patient and patient is able to articulate understanding.  Provided information on BELT program, Support Group and [facility] outpatient pharmacy. All questions answered, will continue to monitor. To be discharged this afternoon

## 2018-04-16 NOTE — Plan of Care (Signed)
Pt alert and oriented, resting with no complaints this am.  Pt ambulating, voiding, and tolerating protein well.  Pain well controlled with PO pain meds.  RN will monitor. Plan to d/c today per MD order.

## 2018-04-21 ENCOUNTER — Telehealth (HOSPITAL_COMMUNITY): Payer: Self-pay

## 2018-04-21 ENCOUNTER — Other Ambulatory Visit: Payer: Self-pay | Admitting: General Surgery

## 2018-04-21 NOTE — Telephone Encounter (Signed)
Patient made aware that she will be sent to hydration, await appointment time from hydration.  Will notify patient in the morning of appointment time.

## 2018-04-21 NOTE — Telephone Encounter (Addendum)
Patient called to discuss post bariatric surgery follow up questions.  See below:   1.  Tell me about your pain and pain management?has used pain medication once per day which has helped pain  2.  Let's talk about fluid intake.  How much total fluid are you taking in?8 ounces per day, MD notified for hydration.  Instructed the need to increase fluids.  Patient stated today the fluids were a little better.  When questioned what she meant by that she stated she was able to take in the broth from chicken noodle soup in addition to 8 ounces.  We discussed hydration in which she was agreeable  3.  How much protein have you taken in the last 2 days?less than a 1/4 shake, states they do not taste good.  4.  Have you had nausea?  Tell me about when have experienced nausea and what you did to help?no nausea  5.  Has the frequency or color changed with your urine?states urinating frequently and light color   6.  Tell me what your incisions look like?no problem  7.  Have you been passing gas? BM?passing gas had bm after mom  8.  If a problem or question were to arise who would you call?  Do you know contact numbers for [facility], [facility], and [facility]?knows how to contact all of the above, Outpatient RD's contacted by [facility] for follow up with patient.  9.  How has the walking going?moving around very tired   10.  How are your vitamins and calcium going?  How are you taking them?not taking any makes her feel bad

## 2018-04-22 ENCOUNTER — Telehealth (HOSPITAL_COMMUNITY): Payer: Self-pay

## 2018-04-22 ENCOUNTER — Ambulatory Visit (HOSPITAL_COMMUNITY)
Admission: RE | Admit: 2018-04-22 | Discharge: 2018-04-22 | Disposition: A | Discharge status: Home / Self Care | Payer: BC Managed Care – PPO | Source: Ambulatory Visit | Attending: General Surgery | Admitting: General Surgery

## 2018-04-22 DIAGNOSIS — E860 Dehydration: Secondary | ICD-10-CM | POA: Insufficient documentation

## 2018-04-22 LAB — COMPREHENSIVE METABOLIC PANEL
ALK PHOS: 75 U/L (ref 38–126)
ALT: 31 U/L (ref 0–44)
ANION GAP: 8 (ref 5–15)
AST: 24 U/L (ref 15–41)
Albumin: 3.8 g/dL (ref 3.5–5.0)
BUN: 26 mg/dL — ABNORMAL HIGH (ref 6–20)
CALCIUM: 9.6 mg/dL (ref 8.9–10.3)
CO2: 29 mmol/L (ref 22–32)
CREATININE: 1.31 mg/dL — AB (ref 0.44–1.00)
Chloride: 109 mmol/L (ref 98–111)
GFR, EST AFRICAN AMERICAN: 54 mL/min — AB (ref >60)
GFR, EST NON AFRICAN AMERICAN: 47 mL/min — AB (ref >60)
Glucose, Bld: 96 mg/dL (ref 70–99)
Potassium: 4.3 mmol/L (ref 3.5–5.1)
SODIUM: 146 mmol/L — AB (ref 135–145)
Total Bilirubin: 1 mg/dL (ref 0.3–1.2)
Total Protein: 8.5 g/dL — ABNORMAL HIGH (ref 6.5–8.1)

## 2018-04-22 MED ORDER — ACETAMINOPHEN 325 MG PO TABS
325.0000 mg | ORAL_TABLET | ORAL | Status: DC | PRN
  Administered 2018-04-22: 650 mg via ORAL
  Filled 2018-04-22: qty 2

## 2018-04-22 MED ORDER — ACETAMINOPHEN 160 MG/5ML PO SOLN
325.0000 mg | ORAL | Status: DC | PRN
  Filled 2018-04-22: qty 20.3

## 2018-04-22 MED ORDER — ONDANSETRON HCL 4 MG/2ML IJ SOLN: 4.0000 mg | INTRAMUSCULAR | Status: DC | PRN

## 2018-04-22 MED ORDER — ACETAMINOPHEN 325 MG RE SUPP
325.0000 mg | RECTAL | Status: DC | PRN
  Filled 2018-04-22: qty 2

## 2018-04-22 MED ORDER — ONDANSETRON 4 MG PO TBDP: 4.0000 mg | ORAL_TABLET | ORAL | Status: DC | PRN

## 2018-04-22 MED ORDER — SODIUM CHLORIDE 0.9 % IV BOLUS: 1000.0000 mL | Freq: Once | INTRAVENOUS | Status: DC

## 2018-04-22 MED ORDER — SODIUM CHLORIDE 0.9 % IV SOLN: INTRAVENOUS | Status: DC

## 2018-04-22 MED ORDER — THIAMINE HCL 100 MG/ML IJ SOLN
INTRAVENOUS | Status: DC
  Administered 2018-04-22: 13:00:00 via INTRAVENOUS
  Filled 2018-04-22 (×4): qty 1000

## 2018-04-22 NOTE — Discharge Instructions (Signed)
Dehydration, Adult Dehydration is a condition in which there is not enough fluid or water in the body. This happens when you lose more fluids than you take in. Important organs, such as the kidneys, brain, and heart, cannot function without a proper amount of fluids. Any loss of fluids from the body can lead to dehydration. Dehydration can range from mild to severe. This condition should be treated right away to prevent it from becoming severe. What are the causes? This condition may be caused by:  Vomiting.  Diarrhea.  Excessive sweating, such as from heat exposure or exercise.  Not drinking enough fluid, especially: ? When ill. ? While doing activity that requires a lot of energy.  Excessive urination.  Fever.  Infection.  Certain medicines, such as medicines that cause the body to lose excess fluid (diuretics).  Inability to access safe drinking water.  Reduced physical ability to get adequate water and food.  What increases the risk? This condition is more likely to develop in people:  Who have a poorly controlled long-term (chronic) illness, such as diabetes, heart disease, or kidney disease.  Who are age 65 or older.  Who are disabled.  Who live in a place with high altitude.  Who play endurance sports.  What are the signs or symptoms? Symptoms of mild dehydration may include:  Thirst.  Dry lips.  Slightly dry mouth.  Dry, warm skin.  Dizziness. Symptoms of moderate dehydration may include:  Very dry mouth.  Muscle cramps.  Dark urine. Urine may be the color of tea.  Decreased urine production.  Decreased tear production.  Heartbeat that is irregular or faster than normal (palpitations).  Headache.  Light-headedness, especially when you stand up from a sitting position.  Fainting (syncope). Symptoms of severe dehydration may include:  Changes in skin, such as: ? Cold and clammy skin. ? Blotchy (mottled) or pale skin. ? Skin that does  not quickly return to normal after being lightly pinched and released (poor skin turgor).  Changes in body fluids, such as: ? Extreme thirst. ? No tear production. ? Inability to sweat when body temperature is high, such as in hot weather. ? Very little urine production.  Changes in vital signs, such as: ? Weak pulse. ? Pulse that is more than 100 beats a minute when sitting still. ? Rapid breathing. ? Low blood pressure.  Other changes, such as: ? Sunken eyes. ? Cold hands and feet. ? Confusion. ? Lack of energy (lethargy). ? Difficulty waking up from sleep. ? Short-term weight loss. ? Unconsciousness. How is this diagnosed? This condition is diagnosed based on your symptoms and a physical exam. Blood and urine tests may be done to help confirm the diagnosis. How is this treated? Treatment for this condition depends on the severity. Mild or moderate dehydration can often be treated at home. Treatment should be started right away. Do not wait until dehydration becomes severe. Severe dehydration is an emergency and it needs to be treated in a hospital. Treatment for mild dehydration may include:  Drinking more fluids.  Replacing salts and minerals in your blood (electrolytes) that you may have lost. Treatment for moderate dehydration may include:  Drinking an oral rehydration solution (ORS). This is a drink that helps you replace fluids and electrolytes (rehydrate). It can be found at pharmacies and retail stores. Treatment for severe dehydration may include:  Receiving fluids through an IV tube.  Receiving an electrolyte solution through a feeding tube that is passed through your nose   and into your stomach (nasogastric tube, or NG tube).  Correcting any abnormalities in electrolytes.  Treating the underlying cause of dehydration. Follow these instructions at home:  If directed by your health care provider, drink an ORS: ? Make an ORS by following instructions on the  package. ? Start by drinking small amounts, about  cup (120 mL) every 5-10 minutes. ? Slowly increase how much you drink until you have taken the amount recommended by your health care provider.  Drink enough clear fluid to keep your urine clear or pale yellow. If you were told to drink an ORS, finish the ORS first, then start slowly drinking other clear fluids. Drink fluids such as: ? Water. Do not drink only water. Doing that can lead to having too little salt (sodium) in the body (hyponatremia). ? Ice chips. ? Fruit juice that you have added water to (diluted fruit juice). ? Low-calorie sports drinks.  Avoid: ? Alcohol. ? Drinks that contain a lot of sugar. These include high-calorie sports drinks, fruit juice that is not diluted, and soda. ? Caffeine. ? Foods that are greasy or contain a lot of fat or sugar.  Take over-the-counter and prescription medicines only as told by your health care provider.  Do not take sodium tablets. This can lead to having too much sodium in the body (hypernatremia).  Eat foods that contain a healthy balance of electrolytes, such as bananas, oranges, potatoes, tomatoes, and spinach.  Keep all follow-up visits as told by your health care provider. This is important. Contact a health care provider if:  You have abdominal pain that: ? Gets worse. ? Stays in one area (localizes).  You have a rash.  You have a stiff neck.  You are more irritable than usual.  You are sleepier or more difficult to wake up than usual.  You feel weak or dizzy.  You feel very thirsty.  You have urinated only a small amount of very dark urine over 6-8 hours. Get help right away if:  You have symptoms of severe dehydration.  You cannot drink fluids without vomiting.  Your symptoms get worse with treatment.  You have a fever.  You have a severe headache.  You have vomiting or diarrhea that: ? Gets worse. ? Does not go away.  You have blood or green matter  (bile) in your vomit.  You have blood in your stool. This may cause stool to look black and tarry.  You have not urinated in 6-8 hours.  You faint.  Your heart rate while sitting still is over 100 beats a minute.  You have trouble breathing. This information is not intended to replace advice given to you by your health care provider. Make sure you discuss any questions you have with your health care provider. Document Released: 10/14/2005 Document Revised: 05/10/2016 Document Reviewed: 12/08/2015 Elsevier Interactive Patient Education  2018 Elsevier Inc.  

## 2018-04-22 NOTE — Progress Notes (Signed)
Patient received 1L multivitamin infusion followed by 1L of NS infusion per orders.  Also with complaint of surgical pain which was medicated with po Tylenol.  Discharged ambulatory in stable condition.

## 2018-04-24 NOTE — Telephone Encounter (Signed)
Patient called for follow up post hydration clinic visit.  She states she feels much better with more energy.  She is not tracking fluids.  She states she had a shake, decaffeinated tea, milk, and popsicle. Suggestions given to patient to increase fluid including FF Fairlife Milk, water enhancers, unflavored jello, or broth/bullion.  Encouraged to track fluids.  RD's made aware of patient fluid challenges.  Instructed patient on s/s of dehydration and to call [facility] if she is unable to drink or has any symptoms of dehydration including dizziness, lightheadedness, headache, or decreased urine.  Phone number provided to patient.  Patient instructed to make sure follow up with PCP occurs next week. No further questions at this time.

## 2018-04-27 DIAGNOSIS — E039 Hypothyroidism, unspecified: Secondary | ICD-10-CM | POA: Insufficient documentation

## 2018-04-27 LAB — HEMOGLOBIN A1C: Hgb A1c MFr Bld: 6 (ref 4.0–6.0)

## 2018-04-27 LAB — HEPATIC FUNCTION PANEL
ALT: 18 (ref 7–35)
AST: 16 (ref 13–35)
Alkaline Phosphatase: 66 (ref 25–125)
BILIRUBIN, TOTAL: 0.3

## 2018-04-27 LAB — LIPID PANEL
Cholesterol: 162 (ref 0–200)
HDL: 30 — AB (ref 35–70)
LDL Cholesterol: 118
LDl/HDL Ratio: 3.9
TRIGLYCERIDES: 72 (ref 40–160)

## 2018-04-27 LAB — BASIC METABOLIC PANEL
BUN: 11 (ref 4–21)
CREATININE: 1 (ref 0.5–1.1)
Glucose: 91
Potassium: 4 (ref 3.4–5.3)
SODIUM: 145 (ref 137–147)

## 2018-04-27 LAB — TSH: TSH: 0.35 — AB (ref 0.41–5.90)

## 2018-04-28 ENCOUNTER — Encounter: Payer: BC Managed Care – PPO | Attending: Surgery | Admitting: Skilled Nursing Facility1

## 2018-04-28 DIAGNOSIS — Z713 Dietary counseling and surveillance: Secondary | ICD-10-CM | POA: Diagnosis present

## 2018-04-28 DIAGNOSIS — E119 Type 2 diabetes mellitus without complications: Secondary | ICD-10-CM

## 2018-04-28 DIAGNOSIS — Z6841 Body Mass Index (BMI) 40.0 and over, adult: Secondary | ICD-10-CM | POA: Insufficient documentation

## 2018-04-29 ENCOUNTER — Encounter: Payer: Self-pay | Admitting: Skilled Nursing Facility1

## 2018-04-29 NOTE — Progress Notes (Signed)
Bariatric Class:  Appt start time: 1530 end time:  1630.  2 Week Post-Operative Nutrition Class  Patient was seen on 04/28/2018 for Post-Operative [facility]   Surgery date: 04/14/2018 Surgery type: Sleeve Start weight at [facility]: 266.2 Weight today: 249.6  TANITA  BODY COMP RESULTS  04/28/2018   BMI (kg/m^2) 42.8   Fat Mass (lbs) 130.8   Fat Free Mass (lbs) 118.8   Total Body Water (lbs) 87.2   The following the learning objectives were met by the patient during this course:  Identifies Phase 3A (Soft, High Proteins) Dietary Goals and will begin from 2 weeks post-operatively to 2 months post-operatively  Identifies appropriate sources of fluids and proteins   States protein recommendations and appropriate sources post-operatively  Identifies the need for appropriate texture modifications, mastication, and bite sizes when consuming solids  Identifies appropriate multivitamin and calcium sources post-operatively  Describes the need for physical activity post-operatively and will follow MD recommendations  States when to call healthcare provider regarding medication questions or post-operative complications  Handouts given during class include:  Phase 3A: Soft, High Protein Diet Handout  Follow-Up Plan: Patient will follow-up at [facility] in 6 weeks for 2 month post-op nutrition visit for diet advancement per MD.

## 2018-05-05 ENCOUNTER — Telehealth: Payer: Self-pay | Admitting: Skilled Nursing Facility1

## 2018-05-05 NOTE — Telephone Encounter (Signed)
Dietitian returned call  LVM

## 2018-05-05 NOTE — Telephone Encounter (Signed)
Pt called concerned she has not lost enough weight.  Dietitian educated pt on this topic.  Pt had a hx of needing the hydration clinic.   Pt also states she was having trouble with food coming back up so she has been eating cottage cheese and applesauce.  Dietitian educated the pt on applesauce.   Pt reports getting in about 54 fluid ounces a day and reaching 60 grams of protein a day.

## 2018-05-19 ENCOUNTER — Other Ambulatory Visit: Payer: Self-pay | Admitting: Surgery

## 2018-05-19 DIAGNOSIS — R109 Unspecified abdominal pain: Secondary | ICD-10-CM

## 2018-05-26 ENCOUNTER — Ambulatory Visit
Admission: RE | Admit: 2018-05-26 | Discharge: 2018-05-26 | Disposition: A | Discharge status: Home / Self Care | Payer: BC Managed Care – PPO | Source: Ambulatory Visit | Attending: Surgery | Admitting: Surgery

## 2018-05-26 DIAGNOSIS — R109 Unspecified abdominal pain: Secondary | ICD-10-CM

## 2018-05-26 MED ORDER — IOPAMIDOL (ISOVUE-300) INJECTION 61%
125.0000 mL | Freq: Once | INTRAVENOUS | Status: AC | PRN
  Administered 2018-05-26: 125 mL via INTRAVENOUS

## 2018-05-28 ENCOUNTER — Telehealth: Payer: Self-pay | Admitting: Registered"

## 2018-05-28 NOTE — Telephone Encounter (Signed)
Pt states she has only lost 15 lbs in 6 weeks post-bariatric surgery. Pt states she thinks something is wrong because she has been at a stall.  Pt states she has been meeting her protein goals daily and taking daily vitamins. Pt states she is still trying to  increase fluid intake to reach goal of 64 ounces; currently consuming 44 ounces. Pt states she has not been released for physical activity yet.   RD encouraged pt to continue increasing fluid intake, focusing more on daily habits, meeting daily goals,  learning her new settings, and less on weight change.

## 2018-06-08 ENCOUNTER — Encounter: Payer: Self-pay | Admitting: Skilled Nursing Facility1

## 2018-06-08 ENCOUNTER — Encounter: Payer: BC Managed Care – PPO | Attending: Surgery | Admitting: Skilled Nursing Facility1

## 2018-06-08 DIAGNOSIS — Z6841 Body Mass Index (BMI) 40.0 and over, adult: Secondary | ICD-10-CM | POA: Diagnosis not present

## 2018-06-08 DIAGNOSIS — Z713 Dietary counseling and surveillance: Secondary | ICD-10-CM | POA: Insufficient documentation

## 2018-06-08 DIAGNOSIS — E119 Type 2 diabetes mellitus without complications: Secondary | ICD-10-CM

## 2018-06-08 NOTE — Progress Notes (Signed)
Sleeve  Primary concerns today: Post-operative Bariatric Surgery Nutrition Management.  Pt states she has reflux every time she drinks and vomited last week due to drinking water. Pt states she had stopped taking her pantoprazole for about 2-3 weeks before her reflux started back up again experiencing the acid mostly with just drinking. Pt states she does not feel like eating she drinks 2 protein shakes. Pt states she logs her food. Pt states there were cysts found on her ovary. Pt states she had a cupcake without issue. Pt states apart of the reason she ate the cupcake is because she was not seeing any weight loss: Dietitian and pt discussed this thought process be a hindrance on future success.  Pt arrived very upset with her slow weight loss and states everyone has been giving her the same message of it takes time but she is tired of hearing that. Pt states she feels better after dietitians education on weight and body composition and will try to not be so discouraged.    Surgery date: 04/14/2018 Surgery type: Sleeve Start weight at [facility]: 266.2 Weight today: 243 Weight change: 6 pounds  TANITA  BODY COMP RESULTS  04/28/2018 06/08/2018   BMI (kg/m^2) 42.8 41.7   Fat Mass (lbs) 130.8 122.8   Fat Free Mass (lbs) 118.8 120.2   Total Body Water (lbs) 87.2 87.8   24-hr recall: B (AM): protein shake (feeling food is too heavy or does not have time to eat) (7 days a week) or oatmeal with almond milk Snk (AM): almonds  L (PM): 2 boiled eggs or protein shake Snk (PM): protein shake or harvest snaps  D (PM): beef tips and gravy or salmon or [place] burger  Snk (PM):   Fluid intake: herbal tea, protein shakes, water Estimated total protein intake: 60+  Medications: no longer taking pantoprazole and gabapentin  Supplementation: not taking due to taste and calcium taking tums (2 every 2 hours for acid reflux)   CBG monitoring: fasting  Average CBG per patient: 80-90's Last patient reported  A1c: 6.0  Using straws: no Drinking while eating: yes Having you been chewing well: yes Chewing/swallowing difficulties: no Changes in vision: no Changes to mood/headaches: no Hair loss/Cahnges to skin/Changes to nails: no Any difficulty focusing or concentrating: no Sweating: no Dizziness/Lightheaded:  Palpitations: no  Carbonated beverages: no N/V/D/C/GAS: no Abdominal Pain: no Dumping syndrome: no  Recent physical activity:  4000 steps averaging and trying to walk more Will start BELT  Progress Towards Goal(s):  In progress.  Handouts given during visit include:  Non starchy veggies + protein    Nutritional Diagnosis:  [place]-3.3 Overweight/obesity related to past poor dietary habits and physical inactivity as evidenced by patient w/ recent sleeve surgery following dietary guidelines for continued weight loss.  Intervention:  Nutrition counseling.  Goals: -Rely on whole foods instead of the protein shakes -try soft cooked vegetables for the first 2 days then if all is well eat them prepared cooked or raw -try the capsule multivitamin   Teaching Method Utilized:  Visual Auditory Hands on  Barriers to learning/adherence to lifestyle change: trouble seeing past the weight  Demonstrated degree of understanding via:  Teach Back   Monitoring/Evaluation:  Dietary intake, exercise, and body weight

## 2018-06-23 ENCOUNTER — Ambulatory Visit: Payer: BC Managed Care – PPO | Admitting: Podiatry

## 2018-06-23 ENCOUNTER — Encounter

## 2018-06-23 ENCOUNTER — Encounter: Payer: Self-pay | Admitting: Podiatry

## 2018-06-23 ENCOUNTER — Telehealth: Payer: Self-pay | Admitting: *Deleted

## 2018-06-23 DIAGNOSIS — M722 Plantar fascial fibromatosis: Secondary | ICD-10-CM | POA: Diagnosis not present

## 2018-06-23 DIAGNOSIS — M7671 Peroneal tendinitis, right leg: Secondary | ICD-10-CM | POA: Diagnosis not present

## 2018-06-23 DIAGNOSIS — T148XXA Other injury of unspecified body region, initial encounter: Secondary | ICD-10-CM

## 2018-06-23 NOTE — Telephone Encounter (Signed)
-----   Message from Rip Harbour, [facility] sent at 06/23/2018 10:47 AM EDT ----- Regarding: MRI  MRI right ankle - evaluate plantar fascial tear and peroneal rupture right foot - surgical consideration

## 2018-06-23 NOTE — Progress Notes (Signed)
She presents today states that her foot is really started bothering me again is been going on now for about the past 2 years is getting to the point where can barely stand it anymore about worn my CAM Walker out I have had mild weight loss surgery and my foot is still hurting.  Objective: Vital signs are stable alert and oriented x3.  Pulses are palpable.  Ear pain on palpation of the plantar calcaneal tubercle right foot she also has some insertional Achilles tendinitis area right foot.  She also has moderate to severe pain on abduction against resistance and on palpation of the peroneal tendons.  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  Plan: Discussed etiology pathology conservative surgical therapy at this point recommend an MRI for evaluation and surgical consideration of her right ankle area.

## 2018-06-23 NOTE — Telephone Encounter (Signed)
Orders given to Gretta Arab, RN, faxed to [facility].

## 2018-06-26 ENCOUNTER — Telehealth: Payer: Self-pay | Admitting: Podiatry

## 2018-06-26 DIAGNOSIS — M79671 Pain in right foot: Secondary | ICD-10-CM

## 2018-06-26 NOTE — Telephone Encounter (Signed)
[facility] called and stated the needed a new referral for Dana Vasquez foot.

## 2018-06-26 NOTE — Addendum Note (Signed)
Addended by: Harriett Sine D on: 06/26/2018 02:53 PM   Modules accepted: Orders

## 2018-06-26 NOTE — Telephone Encounter (Signed)
Pt said she is suppose to be having a MRI done on her foot. [facility] said they are showing a MRI for her ankle. Patient would like for you to double check order.

## 2018-06-26 NOTE — Telephone Encounter (Signed)
Faxed MRI foot right wo to [facility] and gave to Gretta Arab, RN for pre-cert.

## 2018-06-30 ENCOUNTER — Other Ambulatory Visit: Payer: Self-pay

## 2018-07-05 ENCOUNTER — Ambulatory Visit
Admission: RE | Admit: 2018-07-05 | Discharge: 2018-07-05 | Disposition: A | Discharge status: Home / Self Care | Payer: BC Managed Care – PPO | Source: Ambulatory Visit | Attending: Podiatry | Admitting: Podiatry

## 2018-07-05 DIAGNOSIS — M722 Plantar fascial fibromatosis: Secondary | ICD-10-CM

## 2018-07-05 DIAGNOSIS — M7671 Peroneal tendinitis, right leg: Secondary | ICD-10-CM

## 2018-07-05 DIAGNOSIS — T148XXA Other injury of unspecified body region, initial encounter: Secondary | ICD-10-CM

## 2018-07-07 ENCOUNTER — Telehealth: Payer: Self-pay | Admitting: *Deleted

## 2018-07-07 NOTE — Telephone Encounter (Signed)
Left message on pt's home phone informing pt of Dr. Stephenie Acres request to send a copy of the MRI disc to a radiology specialist for more details for treatment planning and this would delay the final result for up to 2 weeks, and we would call with instructions.

## 2018-07-07 NOTE — Telephone Encounter (Signed)
Mailed copy of MRI results to [facility].

## 2018-07-07 NOTE — Telephone Encounter (Signed)
-----   Message from Garrel Ridgel, [place] sent at 07/07/2018  7:11 AM EDT ----- Please send for over read and have them to look for causes of ankle pain as well.

## 2018-07-08 DIAGNOSIS — M797 Fibromyalgia: Secondary | ICD-10-CM | POA: Insufficient documentation

## 2018-07-08 DIAGNOSIS — N83292 Other ovarian cyst, left side: Secondary | ICD-10-CM | POA: Insufficient documentation

## 2018-07-10 NOTE — Telephone Encounter (Signed)
Pt called states she got the message concerning the overread and would like to know what she is to do for pain relief.

## 2018-07-10 NOTE — Telephone Encounter (Signed)
Left message telling pt that she needed to stabilize the areas in a CAM boot and rest, ice and elevate, I would inform Dr. Milinda Pointer of her discomfort and call again. l forgot to tell pt to come to the [place] office today before 2:00pm if she needed to be fitted for a CAM Boot.

## 2018-07-13 MED ORDER — TRAMADOL HCL 50 MG PO TABS: 50.0000 mg | ORAL_TABLET | Freq: Three times a day (TID) | ORAL | 0 refills | Status: DC | PRN

## 2018-07-13 NOTE — Telephone Encounter (Signed)
You can give her tramadol

## 2018-07-13 NOTE — Telephone Encounter (Signed)
Left message informing pt of Dr. Stephenie Acres orders for tramadol and to be aware he would want to see her once the MRI results were available.

## 2018-07-13 NOTE — Telephone Encounter (Signed)
I spoke with Modena Nunnery and she states pt's overread report needs to be signed by Dr. Tamala Julian and then she will fax tomorrow.

## 2018-07-13 NOTE — Telephone Encounter (Signed)
Orders for tramadol faxed to [facility].

## 2018-07-13 NOTE — Telephone Encounter (Signed)
Pt states she had received multiple calls and was not sure which one, but she was told to go back into the boot, but which one the tall one is too heavy and the short is broken. I told pt the tall would stabilize more structure. Pt states the long boot hurts her knee, and is heavy. I told pt if she would bring the short boot, we may be able to replace, if it would make her overall more comfortable. 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 I told pt I could get the note ready for pick up tomorrow. Pt asked if the medication called in was a narcotic. I told her tramadol was and she would not be able to take and work.

## 2018-07-13 NOTE — Addendum Note (Signed)
Addended by: Harriett Sine D on: 07/13/2018 10:30 AM   Modules accepted: Orders

## 2018-07-14 ENCOUNTER — Ambulatory Visit: Payer: BC Managed Care – PPO | Admitting: Podiatry

## 2018-07-14 ENCOUNTER — Encounter: Payer: Self-pay | Admitting: *Deleted

## 2018-07-14 ENCOUNTER — Telehealth: Payer: Self-pay | Admitting: Podiatry

## 2018-07-14 DIAGNOSIS — M722 Plantar fascial fibromatosis: Secondary | ICD-10-CM

## 2018-07-14 NOTE — Telephone Encounter (Signed)
Unable to leave messge, dialed number several times and phone does not ring.

## 2018-07-14 NOTE — Telephone Encounter (Signed)
Left message for pt to call for an appt to discuss 2nd reading of MRI and treatment.

## 2018-07-14 NOTE — Telephone Encounter (Signed)
Received [facility] overread fax, gave to Dr. Milinda Pointer and original to scan.

## 2018-07-14 NOTE — Patient Instructions (Signed)
Pre-Operative Instructions  Congratulations, you have decided to take an important step towards improving your quality of life.  You can be assured that the doctors and staff at [facility] will be with you every step of the way.  Here are some important things you should know:  1. Plan to be at the surgery center/hospital at least 1 (one) hour prior to your scheduled time, unless otherwise directed by the surgical center/hospital staff.  You must have a responsible adult accompany you, remain during the surgery and drive you home.  Make sure you have directions to the surgical center/hospital to ensure you arrive on time. 2. If you are having surgery at [facility] or [facility], you will need a copy of your medical history and physical form from your family physician within one month prior to the date of surgery. We will give you a form for your primary physician to complete.  3. We make every effort to accommodate the date you request for surgery.  However, there are times where surgery dates or times have to be moved.  We will contact you as soon as possible if a change in schedule is required.   4. No aspirin/ibuprofen for one week before surgery.  If you are on aspirin, any non-steroidal anti-inflammatory medications (Mobic, Aleve, Ibuprofen) should not be taken seven (7) days prior to your surgery.  You make take Tylenol for pain prior to surgery.  5. Medications - If you are taking daily heart and blood pressure medications, seizure, reflux, allergy, asthma, anxiety, pain or diabetes medications, make sure you notify the surgery center/hospital before the day of surgery so they can tell you which medications you should take or avoid the day of surgery. 6. No food or drink after midnight the night before surgery unless directed otherwise by surgical center/hospital staff. 7. No alcoholic beverages 24-hours prior to surgery.  No smoking 24-hours prior or 24-hours after  surgery. 8. Wear loose pants or shorts. They should be loose enough to fit over bandages, boots, and casts. 9. Don't wear slip-on shoes. Sneakers are preferred. 10. Bring your boot with you to the surgery center/hospital.  Also bring crutches or a walker if your physician has prescribed it for you.  If you do not have this equipment, it will be provided for you after surgery. 11. If you have not been contacted by the surgery center/hospital by the day before your surgery, call to confirm the date and time of your surgery. 12. Leave-time from work may vary depending on the type of surgery you have.  Appropriate arrangements should be made prior to surgery with your employer. 13. Prescriptions will be provided immediately following surgery by your doctor.  Fill these as soon as possible after surgery and take the medication as directed. Pain medications will not be refilled on weekends and must be approved by the doctor. 14. Remove nail polish on the operative foot and avoid getting pedicures prior to surgery. 15. Wash the night before surgery.  The night before surgery wash the foot and leg well with water and the antibacterial soap provided. Be sure to pay special attention to beneath the toenails and in between the toes.  Wash for at least three (3) minutes. Rinse thoroughly with water and dry well with a towel.  Perform this wash unless told not to do so by your physician.  Enclosed: 1 Ice pack (please put in freezer the night before surgery)   1 Hibiclens skin cleaner     Pre-op instructions  If you have any questions regarding the instructions, please do not hesitate to call our office.  [place]: [address], , [place] [address] -- [phone]  [place]: [address] [place], [place] [address] -- [phone]  [place]: [address]  [place], [place] [address] -- [phone]  [place]: [address], [address], [place], [place] [address] -- [phone]  Website:  [email or web address] 

## 2018-07-15 ENCOUNTER — Telehealth: Payer: Self-pay | Admitting: *Deleted

## 2018-07-15 ENCOUNTER — Encounter: Payer: Self-pay | Admitting: Podiatry

## 2018-07-15 NOTE — Progress Notes (Signed)
She presents today for follow-up of her MRI report.  She states this right foot is just killing me I am ready to have surgery.  Objective: Vital signs are stable she is alert and oriented x3 has severe pain on palpation medial calcaneal tubercle of the right foot.  MRI does demonstrate a tear of the medial and central bands of the plantar fascia of the right foot.  Assessment: Tear of the medial and central bands of the plantar fascia right foot chronic plantar fasciitis right.  Plan: Discussed etiology pathology conservative or surgical therapies.  At this point we discussed surgical intervention consisting of a total endoscopic plantar fasciotomy of the right foot.  She understands this and is amenable to it.  We did discuss the possible postop complications which may include but are not limited to postop pain bleeding swelling infection recurrence need for further surgery overcorrection under correction cuboid impingement syndrome loss of digit loss of limb loss of life.  She signed a surgical consent form today was placed in a new cam walker.  She was provided with both oral and written home-going instructions for care of her foot as well as for her preop instructions surgery center instructions and anesthesia information.  After sterile Betadine skin prep I injected 20 mg Kenalog 5 mg Marcaine point maximal tenderness to help alleviate her symptoms.  I will follow-up with her in the near future for surgical intervention total endoscopic plantar fasciotomy.

## 2018-07-15 NOTE — Telephone Encounter (Signed)
ok 

## 2018-07-15 NOTE — Telephone Encounter (Signed)
Pt presented to office, states she is completing the [facility] pre-registration forms and does not know the surgical procedure. Pt asked if she add the right bunion to the surgery. I told pt she would need to schedule another appt with Dr. Milinda Pointer prior to the surgery to discuss her options. I gave pt a copy of the 4 page consent form.

## 2018-07-16 ENCOUNTER — Encounter: Payer: Self-pay | Admitting: Podiatry

## 2018-07-22 ENCOUNTER — Other Ambulatory Visit: Payer: Self-pay | Admitting: Podiatry

## 2018-07-22 MED ORDER — OXYCODONE-ACETAMINOPHEN 10-325 MG PO TABS: 1.0000 | ORAL_TABLET | Freq: Four times a day (QID) | ORAL | 0 refills | Status: AC | PRN

## 2018-07-22 MED ORDER — CLINDAMYCIN HCL 150 MG PO CAPS: 150.0000 mg | ORAL_CAPSULE | Freq: Three times a day (TID) | ORAL | 0 refills | Status: DC

## 2018-07-22 MED ORDER — ONDANSETRON HCL 4 MG PO TABS: 4.0000 mg | ORAL_TABLET | Freq: Three times a day (TID) | ORAL | 0 refills | Status: DC | PRN

## 2018-07-24 ENCOUNTER — Encounter: Payer: Self-pay | Admitting: Podiatry

## 2018-07-24 DIAGNOSIS — M722 Plantar fascial fibromatosis: Secondary | ICD-10-CM | POA: Diagnosis not present

## 2018-07-27 ENCOUNTER — Telehealth: Payer: Self-pay | Admitting: Podiatry

## 2018-07-27 NOTE — Telephone Encounter (Signed)
I had surgery on Friday with Dr. Milinda Pointer. I just want to make sure I'm clear on the postop instructions to promote or expedite healing. I can be reached at [phone]. Thank you.

## 2018-07-27 NOTE — Telephone Encounter (Signed)
I spoke with pt and she states Dana Vasquez, just called and answered her questions. I told her to call again with any concerns.

## 2018-07-29 ENCOUNTER — Telehealth: Payer: Self-pay | Admitting: *Deleted

## 2018-07-29 NOTE — Telephone Encounter (Signed)
POST OP CALL-    1) General condition stated by the patient: OKAY  2) Is the pt having pain? SOME AFTER THAT NUMBNESS WORE OFF  3) Pain score:   4) Has the pt taken Rx'd pain medication, regularly or PRN? REGULAR FOR A DAY, BUT NOW PRN  5) Is the pain medication giving relief? YES  6) Any fever, chills, nausea, or vomiting, shortness of breath or tightness in calf? NO  7) Is the bandage clean, dry and intact? YES  8) Is there excessive tightness, bleeding or drainage coming through the bandage? NO  9) Did you understand all of the post op instruction sheet given? YES  10) Any questions or concerns regarding post op care/recovery? QUESTIONS REGARDING ICING -     Confirmed POV appointment with patient

## 2018-07-30 ENCOUNTER — Ambulatory Visit (INDEPENDENT_AMBULATORY_CARE_PROVIDER_SITE_OTHER): Payer: BC Managed Care – PPO | Admitting: Podiatry

## 2018-07-30 VITALS — BP 144/85 | HR 78

## 2018-07-30 DIAGNOSIS — M722 Plantar fascial fibromatosis: Secondary | ICD-10-CM

## 2018-07-30 NOTE — Progress Notes (Signed)
DOS: 07-24-2018 Endoscopic Plantar Fasciotomy RT   [facility]

## 2018-07-30 NOTE — Progress Notes (Signed)
She presents today status post endoscopic plantar fasciotomy right foot date of surgery 07/24/2018.  States that it is sore she presents with a cane stating that she only takes the pain meds when she needs them.  Objective: Sterile dressing intact once removed demonstrates no erythema no edema cellulitis drainage or odor sutures are intact margins appear to be well coapted at this point.  Assessment: Well-healing surgical foot status post EPF x1 week.  Plan: I will allow her to start showering with this foot but I do more Band-Aids over the incision sites the majority of the time and she is to remain in the cam walker 99% of the time.  She understands and is amenable to it I will follow-up with her in 1 week for suture removal.

## 2018-08-06 ENCOUNTER — Ambulatory Visit (INDEPENDENT_AMBULATORY_CARE_PROVIDER_SITE_OTHER): Payer: BC Managed Care – PPO

## 2018-08-06 DIAGNOSIS — M722 Plantar fascial fibromatosis: Secondary | ICD-10-CM | POA: Diagnosis not present

## 2018-08-06 DIAGNOSIS — Z09 Encounter for follow-up examination after completed treatment for conditions other than malignant neoplasm: Secondary | ICD-10-CM

## 2018-08-06 MED ORDER — DOXYCYCLINE HYCLATE 100 MG PO TABS: 100.0000 mg | ORAL_TABLET | Freq: Two times a day (BID) | ORAL | 0 refills | Status: DC

## 2018-08-07 NOTE — Progress Notes (Signed)
Patient is here today for follow-up appointment, procedure performed 07/24/2018, EPF right foot.  She states that she is having quite a bit of sharp pains in her big toe joint and on both sides of her heels at the incision sites.  Noted minimal swelling to foot, incisions on medial and lateral side of the foot appear to be healing well, although there is some swelling at the incision sites it appears to be a suture reaction with the tissue being irritated.  Sutures were removed and wound edges remained coapted.  Patient was seen and evaluated by Dr. Milinda Pointer, he prescribed doxycycline 100 mg twice daily x10 days.  A night splint was dispensed along with compression anklet, instructions on use were given to the patient.  Patient is to follow-up in 2 weeks, or sooner with any acute symptom changes.  She is to remain in her boot, and use her night splint when she sleeps.

## 2018-08-10 ENCOUNTER — Encounter: Payer: BC Managed Care – PPO | Attending: Surgery | Admitting: Skilled Nursing Facility1

## 2018-08-10 ENCOUNTER — Encounter: Payer: Self-pay | Admitting: Skilled Nursing Facility1

## 2018-08-10 DIAGNOSIS — Z713 Dietary counseling and surveillance: Secondary | ICD-10-CM | POA: Insufficient documentation

## 2018-08-10 DIAGNOSIS — Z6841 Body Mass Index (BMI) 40.0 and over, adult: Secondary | ICD-10-CM | POA: Insufficient documentation

## 2018-08-10 DIAGNOSIS — E088 Diabetes mellitus due to underlying condition with unspecified complications: Secondary | ICD-10-CM

## 2018-08-10 NOTE — Patient Instructions (Addendum)
-  If using a protein shake for a snack only drink half  -Only eat 3 ounces of protein at your meals and about 1 ounce at snack   -Have something on your stomach before you take your multivitamin  -Talk to your doctor about your dizzy spells  -Check your blood sugar and blood pressure when feeling dizzy  -Try kefir instead of protein shake

## 2018-08-10 NOTE — Progress Notes (Signed)
Sleeve  Primary concerns today: Post-operative Bariatric Surgery Nutrition Management.  Dana Vasquez is still upset her weight is coming off slowly.   Dana Vasquez states she just got plantar fascitis surgery so has not been active. Dana Vasquez states she is bored with the same foods. Dana Vasquez states she has been eating 4-6 ounces of protein per meal. Dana Vasquez states her stomach hurts all the time (after eating 6 ounces of protein). Dana Vasquez states she has been getting dizzy. Dana Vasquez states she feels like she is taking something all day long which is frustrating. Dana Vasquez states she got out of the car and felt so dizzy she almost fell. Dana Vasquez states she has been having reflux a couple times a week. Dana Vasquez states she has been dizzy every day.   Surgery date: 04/14/2018 Surgery type: Sleeve Start weight at [facility]: 266.2 Weight today: 223.2 Weight change: 20 pounds  TANITA  BODY COMP RESULTS  04/28/2018 06/08/2018 08/10/2018   BMI (kg/m^2) 42.8 41.7 37.1   Fat Mass (lbs) 130.8 122.8 110   Fat Free Mass (lbs) 118.8 120.2 113.2   Total Body Water (lbs) 87.2 87.8 82   24-hr recall: always having at least 1 protein shake a day sometimes 2 B (AM): protein shake (feeling food is too heavy or does not have time to eat) (7 days a week) or oatmeal with almond milk or cottage cheese and [place] sausage  Snk (AM): almonds or yogurt or protein shake  L (PM): 2 boiled eggs or protein shake or roasted chicken bites or [place] burger with ketchup Snk (PM): protein shake or harvest snaps  D (PM): beef tips and gravy or salmon or [place] burger  Snk (PM):   Fluid intake: herbal tea, protein shakes, decaf, water 44 ounces Estimated total protein intake: 60+  Medications: no longer taking pantoprazole and gabapentin  Supplementation: opurity and calcium taking tums (2 every 2 hours for acid reflux)   CBG monitoring: fasting  Average CBG per patient: 80-90's, 117 Last patient reported A1c: 6.0  Using straws: no Drinking while eating: yes with pain Having you been  chewing well: yes Chewing/swallowing difficulties: no Changes in vision: no Changes to mood/headaches: no Hair loss/Cahnges to skin/Changes to nails: no Any difficulty focusing or concentrating: no Sweating: no Dizziness/Lightheaded: yes Palpitations: no  Carbonated beverages: no N/V/D/C/GAS: no Abdominal Pain: no Dumping syndrome: no  Recent physical activity:  4000 steps averaging and trying to walk more Will start BELT  Progress Towards Goal(s):  In progress.  Handouts given during visit include:  Non starchy veggies + protein    Nutritional Diagnosis:  [place]-3.3 Overweight/obesity related to past poor dietary habits and physical inactivity as evidenced by patient w/ recent sleeve surgery following dietary guidelines for continued weight loss.  Intervention:  Nutrition counseling.  Goals: -If using a protein shake for a snack only drink half -Only eat 3 ounces of protein at your meals and about 1 ounce at snack  -Have something on your stomach before you take your multivitamin -Talk to your doctor about your dizzy spells -Check your blood sugar and blood pressure when feeling dizzy -Try kefir instead of protein shake  Teaching Method Utilized:  Visual Auditory Hands on  Barriers to learning/adherence to lifestyle change: trouble seeing past the weight  Demonstrated degree of understanding via:  Teach Back   Monitoring/Evaluation:  Dietary intake, exercise, and body weight

## 2018-08-15 ENCOUNTER — Encounter: Payer: Self-pay | Admitting: Internal Medicine

## 2018-08-15 DIAGNOSIS — E039 Hypothyroidism, unspecified: Secondary | ICD-10-CM

## 2018-08-16 ENCOUNTER — Other Ambulatory Visit: Payer: Self-pay | Admitting: Internal Medicine

## 2018-08-16 ENCOUNTER — Other Ambulatory Visit: Payer: Self-pay | Admitting: Podiatry

## 2018-08-25 ENCOUNTER — Ambulatory Visit (INDEPENDENT_AMBULATORY_CARE_PROVIDER_SITE_OTHER): Payer: BC Managed Care – PPO | Admitting: Podiatry

## 2018-08-25 DIAGNOSIS — M722 Plantar fascial fibromatosis: Secondary | ICD-10-CM

## 2018-08-25 DIAGNOSIS — Z09 Encounter for follow-up examination after completed treatment for conditions other than malignant neoplasm: Secondary | ICD-10-CM

## 2018-08-25 NOTE — Progress Notes (Signed)
She presents today for follow-up of her endoscopic plantar fasciotomy she states that her foot is feeling much better and she just got back into her regular shoes last couple of days.  States that she still has some heel pain but it feels much better than the pain she is experienced for the past 3 years.  Objective: Vital signs are stable she is alert and oriented x3.  Pulses are palpable.  There is no erythema edema cellulitis drainage or odor she has mild tenderness on palpation of the surgical site.  Much decreased from previous evaluation.  Assessment: Well-healing surgical foot right.  Plan: Continue use of regular tennis shoes all the time no bare feet no flip-flops and sandals I recommended that she follow-up with me in 4 weeks however I think she may be able to return to work in 2 weeks provided she can work light duty.

## 2018-08-31 ENCOUNTER — Encounter: Payer: Self-pay | Admitting: Adult Health

## 2018-09-02 ENCOUNTER — Ambulatory Visit (INDEPENDENT_AMBULATORY_CARE_PROVIDER_SITE_OTHER): Payer: BC Managed Care – PPO | Admitting: Adult Health

## 2018-09-02 ENCOUNTER — Encounter: Payer: Self-pay | Admitting: Adult Health

## 2018-09-02 ENCOUNTER — Other Ambulatory Visit: Payer: Self-pay | Admitting: Neurology

## 2018-09-02 VITALS — BP 120/72 | HR 63 | Ht 64.0 in | Wt 220.8 lb

## 2018-09-02 DIAGNOSIS — J4540 Moderate persistent asthma, uncomplicated: Secondary | ICD-10-CM

## 2018-09-02 DIAGNOSIS — Z6841 Body Mass Index (BMI) 40.0 and over, adult: Secondary | ICD-10-CM

## 2018-09-02 DIAGNOSIS — R634 Abnormal weight loss: Secondary | ICD-10-CM | POA: Diagnosis not present

## 2018-09-02 DIAGNOSIS — G4733 Obstructive sleep apnea (adult) (pediatric): Secondary | ICD-10-CM

## 2018-09-02 DIAGNOSIS — Z9989 Dependence on other enabling machines and devices: Principal | ICD-10-CM

## 2018-09-02 NOTE — Patient Instructions (Signed)
Your Plan:  Continue using CPAP nightly and >4 hours each night Repeat Home Sleep test If your symptoms worsen or you develop new symptoms please let us know.   Thank you for coming to see [place] at [facility]. I hope we have been able to provide you high quality care today.  You may receive a patient satisfaction survey over the next few weeks. We would appreciate your feedback and comments so that we may continue to improve ourselves and the health of our patients.

## 2018-09-02 NOTE — Progress Notes (Signed)
PATIENT: Dana Vasquez DOB: 1965-11-11  REASON FOR VISIT: follow up HISTORY FROM: patient  HISTORY OF PRESENT ILLNESS: Today 09/02/18:  Dana Vasquez is a 52 year old female with a history of obstructive sleep apnea on CPAP.  She reports that since her gastric bypass surgery she has lost approximately 55 pounds.  Her CPAP download indicates that she use her machine 21 out of 30 days for compliance of 70%.  She has her machine greater than 4 hours 15 days for compliance to 15%.  On average she uses her machine 6 hours and 11 minutes.  Her residual AHI is 2 on 5 to 10 cm of water with EPR of 1.  Her leak in the 95th percentile is 33 L/min.  She states that when she got new supplies the mask did not fit right.  In the past week she has had a mask refitting and has a new mask.  She states that it is much better.  She returns today for evaluation.  HISTORY 12/03/17, I have the pleasure of seeing Dana Vasquez, in a RV for CPAP compliance.  The patient has noted an increase of bronchitis sinusitis and other upper airway infections during the winter months and she correlated this in her mind to CPAP machine harboring bacteria.  She has used a vinegar water mixed now to clean the machine, but she is saving up for so clean machine.  Her Epworth sleepiness score is still elevated at 14 points and her fatigue severity at 46 points, and her compliance report for CPAP shows only 21 out of 30 days of use which is a 70% compliance and some of these days under 4 hours average use of time is 3 hours and 57 minutes she just falls short of 4 hours.  The AHI is 1.5 showing that the current settings worked very well for the patient the AutoSet is between 5 and 10 cmH2O pressure window to 3 cm EPR I actually would like to lower the EPR response. She is asthmatic and this may help her. Her humidifier settings were discussed , too- she probably needs to bump the setting from 3 to 5 during heating season. Her main  risk factor has been her elevated body mass index, the patient is a thyroid cancer survivor and weight loss has been difficult since thyroidectomy she has been seen by Dr. Thelma Barge surgery in preparation for a gastric sleeve procedure.  Surgery will likely take place this spring.CD   REVIEW OF SYSTEMS: Out of a complete 14 system review of symptoms, the patient complains only of the following symptoms, and all other reviewed systems are negative.  Epworth sleepiness score 5 Appetite change, blurred vision, joint pain, apnea, dizziness, bruise/bleed easily  ALLERGIES: Allergies  Allergen Reactions  . Amoxicillin Other (See Comments)    Headache Has patient had a PCN reaction causing immediate rash, facial/tongue/throat swelling, SOB or lightheadedness with hypotension: No Has patient had a PCN reaction causing severe rash involving mucus membranes or skin necrosis: No Has patient had a PCN reaction that required hospitalization: No Has patient had a PCN reaction occurring within the last 10 years: Unknown If all of the above answers are "NO", then may proceed with Cephalosporin use.   . Nitrofurantoin Monohyd Macro Other (See Comments)    Pt stated she gets a bad yeast infection  . Dexilant [Dexlansoprazole]     "bad stomachache"  . Meloxicam     Upset stomach and stomach pains  .  Budesonide Rash  . Formoterol Fumarate Rash    HOME MEDICATIONS: Outpatient Medications Prior to Visit  Medication Sig Dispense Refill  . CONTOUR NEXT TEST test strip U UTD TID  3  . gabapentin (NEURONTIN) 100 MG capsule Take 1 capsule (100 mg total) by mouth 2 (two) times daily. 60 capsule 3  . glucose blood (ONETOUCH VERIO) test strip 1 each by Other route as needed for other. Use as instructed    . levothyroxine (SYNTHROID, LEVOTHROID) 125 MCG tablet 125 mcg daily.  4  . MICROLET LANCETS MISC TEST TID  10  . Multiple Vitamins-Minerals (BARIATRIC MULTIVITAMINS/IRON) CAPS Take by mouth.    .  naproxen (NAPROSYN) 500 MG tablet Take 500 mg by mouth 2 (two) times daily with a meal.    . losartan (COZAAR) 25 MG tablet Take 25 mg by mouth daily.    Marland Kitchen BREO ELLIPTA 100-25 MCG/INH AEPB INHALE 1 PUFF BY MOUTH EVERY DAY AT THE SAME TIME EACH DAY. 1 each 1  . clindamycin (CLEOCIN) 150 MG capsule Take 1 capsule (150 mg total) by mouth 3 (three) times daily. 30 capsule 0  . doxycycline (VIBRA-TABS) 100 MG tablet Take 1 tablet (100 mg total) by mouth 2 (two) times daily. 20 tablet 0  . levocetirizine (XYZAL) 5 MG tablet TK 1 T PO D    . liothyronine (CYTOMEL) 5 MCG tablet liothyronine 5 mcg tablet    . metFORMIN (GLUCOPHAGE-XR) 500 MG 24 hr tablet metformin ER 500 mg tablet,extended release 24 hr    . mometasone (NASONEX) 50 MCG/ACT nasal spray Place 2 sprays into the nose daily.    . montelukast (SINGULAIR) 10 MG tablet TAKE 1 TABLET BY MOUTH EVERY EVENING. 30 tablet 0  . ondansetron (ZOFRAN) 4 MG tablet Take 1 tablet (4 mg total) by mouth every 8 (eight) hours as needed for nausea or vomiting. 20 tablet 0  . ondansetron (ZOFRAN-ODT) 4 MG disintegrating tablet TK 1 T PO Q 8 HOURS PRN  0  . pantoprazole (PROTONIX) 40 MG tablet TK 1 T PO QD  2  . PROAIR HFA 108 (90 Base) MCG/ACT inhaler Inhale 2 puffs into the lungs 4 (four) times daily as needed (for wheezing.).   5  . traMADol (ULTRAM) 50 MG tablet Take 1 tablet (50 mg total) by mouth every 8 (eight) hours as needed. 21 tablet 0   No facility-administered medications prior to visit.     PAST MEDICAL HISTORY: Past Medical History:  Diagnosis Date  . Allergy   . Anemia   . Arthritis    knees  AND SHOULDERS  . Asthma    allergy related - rarely uses inhaler  . Bruises easily   . Cancer (HCC)    1995;thyroid cancer, SURGERY AND RADIOACTIVE IODINE  . Diabetes mellitus without complication (HCC)    DIET CONTROLLED  . Ectopic pregnancy   . Euthyroid   . Fatigue   . Fibromyalgia   . GERD (gastroesophageal reflux disease)   .  Headache(784.0)    otc med prn - last one 02/2014  . History of blood transfusion 2000   In [place], [place] at [place] - ? 4 units transfused  . Hypertension   . Hypothyroidism   . Knee pain   . Sleep apnea    uses CPAP  . SVD (spontaneous vaginal delivery)    x 3  . Thyroid disease    thyroidectomy 1995    PAST SURGICAL HISTORY: Past Surgical History:  Procedure Laterality Date  .  ABDOMINAL HYSTERECTOMY N/A 04/20/2014   Procedure: Total ABDOMINAL HYSTERECTOMY partial right salpingectomy;  Surgeon: Eldred Manges, MD;  Location: [facility];  Service: Gynecology;  Laterality: N/A;  . BREATH TEK H PYLORI  09/18/2011   Procedure: BREATH TEK H PYLORI;  Surgeon: Pedro Earls, MD;  Location: [facility];  Service: General;  Laterality: N/A;  . BUNIONECTOMY     left;2015  . COLONOSCOPY    . DILATION AND CURETTAGE OF UTERUS  1988   endometriosis  . fallopian tubes removed     left tube removed per patient - laparotomy  . FOOT SURGERY Right 2019  . LAPAROSCOPIC GASTRIC SLEEVE RESECTION N/A 04/14/2018   Procedure: LAPAROSCOPIC GASTRIC SLEEVE RESECTION WITH UPPER ENDO AND ERAS PATHWAY;  Surgeon: Johnathan Hausen, MD;  Location: [facility];  Service: General;  Laterality: N/A;  . LYSIS OF ADHESION N/A 04/20/2014   Procedure: LYSIS OF ADHESION;  Surgeon: Eldred Manges, MD;  Location: [facility];  Service: Gynecology;  Laterality: N/A;  . THYROIDECTOMY  1995  . TUBAL LIGATION    . WISDOM TOOTH EXTRACTION      FAMILY HISTORY: Family History  Problem Relation Age of Onset  . Hypertension Mother   . Cancer Mother        uterine  . Cancer Father   . Cancer Brother        stomach; 68  . Early death Brother   . Stroke Maternal Uncle   . Heart disease Maternal Uncle   . Food Allergy Daughter   . Allergic rhinitis Daughter   . Allergic rhinitis Daughter   . Angioedema Neg Hx   . Asthma Neg Hx   . Eczema Neg Hx   . Immunodeficiency Neg Hx   . Urticaria Neg Hx     SOCIAL  HISTORY: Social History   Socioeconomic History  . Marital status: Married    Spouse name: Not on file  . Number of children: Not on file  . Years of education: Not on file  . Highest education level: Not on file  Occupational History  . Not on file  Social Needs  . Financial resource strain: Not on file  . Food insecurity:    Worry: Not on file    Inability: Not on file  . Transportation needs:    Medical: Not on file    Non-medical: Not on file  Tobacco Use  . Smoking status: Never Smoker  . Smokeless tobacco: Never Used  Substance and Sexual Activity  . Alcohol use: Yes    Alcohol/week: 0.0 standard drinks    Comment: socially; every 3-4 months  . Drug use: No  . Sexual activity: Yes    Birth control/protection: Surgical  Lifestyle  . Physical activity:    Days per week: Not on file    Minutes per session: Not on file  . Stress: Not on file  Relationships  . Social connections:    Talks on phone: Not on file    Gets together: Not on file    Attends religious service: Not on file    Active member of club or organization: Not on file    Attends meetings of clubs or organizations: Not on file    Relationship status: Not on file  . Intimate partner violence:    Fear of current or ex partner: Not on file    Emotionally abused: Not on file    Physically abused: Not on file    Forced sexual activity: Not on  file  Other Topics Concern  . Not on file  Social History Narrative  . Not on file      PHYSICAL EXAM  Vitals:   09/02/18 0712  BP: 120/72  Pulse: 63  Weight: 220 lb 12.8 oz (100.2 kg)  Height: 5\' 4"  (1.626 m)   Body mass index is 37.9 kg/m.  Generalized: Well developed, in no acute distress   Neurological examination  Mentation: Alert oriented to time, place, history taking. Follows all commands speech and language fluent Cranial nerve II-XII: Pupils were equal round reactive to light. Extraocular movements were full, visual field were full on  confrontational test. Facial sensation and strength were normal. Uvula tongue midline. Head turning and shoulder shrug  were normal and symmetric. Motor: The motor testing reveals 5 over 5 strength of all 4 extremities. Good symmetric motor tone is noted throughout.  Sensory: Sensory testing is intact to soft touch on all 4 extremities. No evidence of extinction is noted.  Coordination: Cerebellar testing reveals good finger-nose-finger and heel-to-shin bilaterally.  Gait and station: Gait is normal. Reflexes: Deep tendon reflexes are symmetric and normal bilaterally.   DIAGNOSTIC DATA (LABS, IMAGING, TESTING) - I reviewed patient records, labs, notes, testing and imaging myself where available.  Lab Results  Component Value Date   WBC 11.5 (H) 04/16/2018   HGB 10.5 (L) 04/16/2018   HCT 32.5 (L) 04/16/2018   MCV 76.7 (L) 04/16/2018   PLT 306 04/16/2018      Component Value Date/Time   NA 145 04/27/2018   K 4.0 04/27/2018   CL 109 04/22/2018 1210   CO2 29 04/22/2018 1210   GLUCOSE 96 04/22/2018 1210   BUN 11 04/27/2018   CREATININE 1.0 04/27/2018   CREATININE 1.31 (H) 04/22/2018 1210   CREATININE 1.01 10/10/2011 0914   CALCIUM 9.6 04/22/2018 1210   PROT 8.5 (H) 04/22/2018 1210   ALBUMIN 3.8 04/22/2018 1210   AST 16 04/27/2018   ALT 18 04/27/2018   ALKPHOS 66 04/27/2018   BILITOT 1.0 04/22/2018 1210   GFRNONAA 47 (L) 04/22/2018 1210   GFRAA 54 (L) 04/22/2018 1210   Lab Results  Component Value Date   CHOL 162 04/27/2018   HDL 30 (A) 04/27/2018   LDLCALC 118 04/27/2018   TRIG 72 04/27/2018   CHOLHDL 3.7 10/10/2011   Lab Results  Component Value Date   HGBA1C 6.0 04/27/2018   No results found for: [id number] Lab Results  Component Value Date   TSH 0.35 (A) 04/27/2018      ASSESSMENT AND PLAN 52 y.o. year old female  has a past medical history of Allergy, Anemia, Arthritis, Asthma, Bruises easily, Cancer ([place]), Diabetes mellitus without complication ([place]),  Ectopic pregnancy, Euthyroid, Fatigue, Fibromyalgia, GERD (gastroesophageal reflux disease), Headache(784.0), History of blood transfusion (2000), Hypertension, Hypothyroidism, Knee pain, Sleep apnea, SVD (spontaneous vaginal delivery), and Thyroid disease. here with :  1.  Obstructive sleep apnea on CPAP  The patient CPAP download shows suboptimal compliance with good treatment of her apnea.  She is encouraged to try to use machine nightly and greater than 4 hours each night.  Because she has had a 55 lb weight loss we will repeat a home sleep test.  She is advised that if her symptoms worsen or she develops new symptoms she should  let us know.  She will follow-up in 6 months or sooner if needed.  I spent 15 minutes with the patient. 50% of this time was spent reviewing CPAP download  Ward Givens, MSN, NP-C 09/02/2018, 7:36 AM [facility] [address] [place], [place] [address] [phone]

## 2018-09-03 ENCOUNTER — Encounter: Payer: Self-pay | Admitting: Internal Medicine

## 2018-09-03 ENCOUNTER — Ambulatory Visit (INDEPENDENT_AMBULATORY_CARE_PROVIDER_SITE_OTHER): Payer: BC Managed Care – PPO | Admitting: Internal Medicine

## 2018-09-03 VITALS — Temp 98.3°F | Ht 63.5 in | Wt 218.0 lb

## 2018-09-03 DIAGNOSIS — Z0000 Encounter for general adult medical examination without abnormal findings: Secondary | ICD-10-CM | POA: Diagnosis not present

## 2018-09-03 DIAGNOSIS — I10 Essential (primary) hypertension: Secondary | ICD-10-CM

## 2018-09-03 LAB — POCT URINALYSIS DIPSTICK
Bilirubin, UA: NEGATIVE
Blood, UA: NEGATIVE
Glucose, UA: NEGATIVE
Ketones, UA: NEGATIVE
LEUKOCYTES UA: NEGATIVE
Nitrite, UA: NEGATIVE
PROTEIN UA: NEGATIVE
Spec Grav, UA: 1.02 (ref 1.010–1.025)
Urobilinogen, UA: 0.2 E.U./dL
pH, UA: 5.5 (ref 5.0–8.0)

## 2018-09-03 LAB — POCT UA - MICROALBUMIN
Albumin/Creatinine Ratio, Urine, POC: <30
CREATININE, POC: 300 mg/dL
Microalbumin Ur, POC: 10 mg/L

## 2018-09-03 NOTE — Progress Notes (Signed)
Subjective:     Patient ID: Dana Vasquez , female    DOB: [birth year] , 52 y.o.   MRN: [id number]   Chief Complaint  Patient presents with  . Annual Exam  . Hypertension    HPI  She is here today for a full physical exam. She is followed by GYN for her pelvic exams. She has no specific concerns or complaints at this time.   Hypertension  This is a chronic problem. The current episode started more than 1 year ago. The problem has been gradually improving since onset. The problem is controlled. Pertinent negatives include no blurred vision, chest pain, headaches, orthopnea or shortness of breath. Risk factors for coronary artery disease include obesity, sedentary lifestyle, post-menopausal state and dyslipidemia.   She admits that she recently stopped her bp meds. She reports that she had been dizzy, her sx have since resolved.   Past Medical History:  Diagnosis Date  . Allergy   . Anemia   . Arthritis    knees  AND SHOULDERS  . Asthma    allergy related - rarely uses inhaler  . Bruises easily   . Cancer (HCC)    1995;thyroid cancer, SURGERY AND RADIOACTIVE IODINE  . Diabetes mellitus without complication (HCC)    DIET CONTROLLED  . Ectopic pregnancy   . Euthyroid   . Fatigue   . Fibromyalgia   . GERD (gastroesophageal reflux disease)   . Headache(784.0)    otc med prn - last one 02/2014  . History of blood transfusion 2000   In [place], [place] at [facility] - ? 4 units transfused  . Hypertension   . Hypothyroidism   . Knee pain   . Sleep apnea    uses CPAP  . SVD (spontaneous vaginal delivery)    x 3  . Thyroid disease    thyroidectomy 1995     Family History  Problem Relation Age of Onset  . Hypertension Mother   . Cancer Mother        uterine  . Cancer Father   . Cancer Brother        stomach; 10  . Early death Brother   . Stroke Maternal Uncle   . Heart disease Maternal Uncle   . Food Allergy Daughter   . Allergic rhinitis Daughter   . Allergic  rhinitis Daughter   . Angioedema Neg Hx   . Asthma Neg Hx   . Eczema Neg Hx   . Immunodeficiency Neg Hx   . Urticaria Neg Hx      Current Outpatient Medications:  .  CONTOUR NEXT TEST test strip, U UTD TID, Disp: , Rfl: 3 .  levothyroxine (SYNTHROID, LEVOTHROID) 125 MCG tablet, 125 mcg daily., Disp: , Rfl: 4 .  MICROLET LANCETS MISC, TEST TID, Disp: , Rfl: 10 .  Multiple Vitamins-Minerals (BARIATRIC MULTIVITAMINS/IRON) CAPS, Take by mouth., Disp: , Rfl:  .  naproxen (NAPROSYN) 500 MG tablet, Take 500 mg by mouth 2 (two) times daily with a meal., Disp: , Rfl:    Allergies  Allergen Reactions  . Amoxicillin Other (See Comments)    Headache Has patient had a PCN reaction causing immediate rash, facial/tongue/throat swelling, SOB or lightheadedness with hypotension: No Has patient had a PCN reaction causing severe rash involving mucus membranes or skin necrosis: No Has patient had a PCN reaction that required hospitalization: No Has patient had a PCN reaction occurring within the last 10 years: Unknown If all of the above answers are "NO", then  may proceed with Cephalosporin use.   . Nitrofurantoin Monohyd Macro Other (See Comments)    Pt stated she gets a bad yeast infection  . Dexilant [Dexlansoprazole]     "bad stomachache"  . Meloxicam     Upset stomach and stomach pains  . Budesonide Rash  . Formoterol Fumarate Rash     Review of Systems  Constitutional: Negative.   HENT: Negative.   Eyes: Negative.  Negative for blurred vision.  Respiratory: Negative.  Negative for shortness of breath.   Cardiovascular: Negative.  Negative for chest pain and orthopnea.  Gastrointestinal: Negative.   Endocrine: Negative.   Genitourinary: Negative.   Musculoskeletal: Negative.   Skin: Negative.   Allergic/Immunologic: Negative.   Neurological: Negative.  Negative for headaches.  Hematological: Negative.   Psychiatric/Behavioral: Negative.         Patient's last menstrual period  was 04/07/2014.. Negative for: breast discharge, breast lump(s), breast pain and breast self exam. Associated symptoms include abnormal vaginal bleeding. Pertinent negatives include abnormal bleeding (hematology), anxiety, decreased libido, depression, difficulty falling sleep, dyspareunia, history of infertility, nocturia, sexual dysfunction, sleep disturbances, urinary incontinence, urinary urgency, vaginal discharge and vaginal itching. Diet regular.The patient states her exercise level is  minimal.  . The patient's tobacco use is:  Social History   Tobacco Use  Smoking Status Never Smoker  Smokeless Tobacco Never Used  . She has been exposed to passive smoke. The patient's alcohol use is:  Social History   Substance and Sexual Activity  Alcohol Use Yes  . Alcohol/week: 0.0 standard drinks   Comment: socially; every 3-4 months   Today's Vitals   09/03/18 1052  Temp: 98.3 F (36.8 C)  TempSrc: Oral  Weight: 218 lb (98.9 kg)  Height: 5' 3.5" (1.613 m)   Body mass index is 38.01 kg/m.   Objective:  Physical Exam  Constitutional: She is oriented to person, place, and time. She appears well-developed and well-nourished.  HENT:  Head: Normocephalic and atraumatic.  Right Ear: External ear normal.  Left Ear: External ear normal.  Nose: Nose normal.  Mouth/Throat: Oropharynx is clear and moist.  Eyes: Pupils are equal, round, and reactive to light. Conjunctivae and EOM are normal.  Neck: Normal range of motion. Neck supple.  Cardiovascular: Normal rate, regular rhythm, normal heart sounds and intact distal pulses.  Pulmonary/Chest: Effort normal and breath sounds normal. Right breast exhibits no inverted nipple, no mass, no nipple discharge, no skin change and no tenderness. Left breast exhibits no inverted nipple, no mass, no nipple discharge, no skin change and no tenderness.  Abdominal: Soft. Bowel sounds are normal.  Healed surgical scars  Genitourinary:  Genitourinary  Comments: deferred  Musculoskeletal: Normal range of motion.  Neurological: She is alert and oriented to person, place, and time.  Skin: Skin is warm and dry.  Nursing note and vitals reviewed.       Assessment And Plan:     1. Routine general medical examination at health care facility  A full exam was performed. Importance of monthly self breast exams was discussed with the patient.  PATIENT HAS BEEN ADVISED TO GET 30-45 MINUTES REGULAR EXERCISE NO LESS THAN FOUR TO FIVE DAYS PER WEEK - BOTH WEIGHTBEARING EXERCISES AND AEROBIC ARE RECOMMENDED.  SHE IS ADVISED TO FOLLOW A HEALTHY DIET WITH AT LEAST SIX FRUITS/VEGGIES PER DAY, DECREASE INTAKE OF RED MEAT, AND TO INCREASE FISH INTAKE TO TWO DAYS PER WEEK.  MEATS/FISH SHOULD NOT BE FRIED, BAKED OR BROILED IS PREFERABLE.  I SUGGEST WEARING SPF 50 SUNSCREEN ON EXPOSED PARTS AND ESPECIALLY WHEN IN THE DIRECT SUNLIGHT FOR AN EXTENDED PERIOD OF TIME.  PLEASE AVOID FAST FOOD RESTAURANTS AND INCREASE YOUR WATER INTAKE.  - CMP14+EGFR - CBC - Lipid panel - Hemoglobin A1c - TSH - T4, Free  2. Essential hypertension, benign  Well Controlled, now off of medication. She is encouraged to stay well hydrated. She will rto in six months for re-evaluation. Importance of regular exercise was discussed with the patient.   - POCT Urinalysis Dipstick (84536) - POCT UA - Microalbumin - EKG 12-Lead        [name], MD

## 2018-09-03 NOTE — Patient Instructions (Signed)
Exercising to Lose Weight Exercising can help you to lose weight. In order to lose weight through exercise, you need to do vigorous-intensity exercise. You can tell that you are exercising with vigorous intensity if you are breathing very hard and fast and cannot hold a conversation while exercising. Moderate-intensity exercise helps to maintain your current weight. You can tell that you are exercising at a moderate level if you have a higher heart rate and faster breathing, but you are still able to hold a conversation. How often should I exercise? Choose an activity that you enjoy and set realistic goals. Your health care provider can help you to make an activity plan that works for you. Exercise regularly as directed by your health care provider. This may include:  Doing resistance training twice each week, such as: ? Push-ups. ? Sit-ups. ? Lifting weights. ? Using resistance bands.  Doing a given intensity of exercise for a given amount of time. Choose from these options: ? 150 minutes of moderate-intensity exercise every week. ? 75 minutes of vigorous-intensity exercise every week. ? A mix of moderate-intensity and vigorous-intensity exercise every week.  Children, pregnant women, people who are out of shape, people who are overweight, and older adults may need to consult a health care provider for individual recommendations. If you have any sort of medical condition, be sure to consult your health care provider before starting a new exercise program. What are some activities that can help me to lose weight?  Walking at a rate of at least 4.5 miles an hour.  Jogging or running at a rate of 5 miles per hour.  Biking at a rate of at least 10 miles per hour.  Lap swimming.  Roller-skating or in-line skating.  Cross-country skiing.  Vigorous competitive sports, such as football, basketball, and soccer.  Jumping rope.  Aerobic dancing. How can I be more active in my day-to-day  activities?  Use the stairs instead of the elevator.  Take a walk during your lunch break.  If you drive, park your car farther away from work or school.  If you take public transportation, get off one stop early and walk the rest of the way.  Make all of your phone calls while standing up and walking around.  Get up, stretch, and walk around every 30 minutes throughout the day. What guidelines should I follow while exercising?  Do not exercise so much that you hurt yourself, feel dizzy, or get very short of breath.  Consult your health care provider prior to starting a new exercise program.  Wear comfortable clothes and shoes with good support.  Drink plenty of water while you exercise to prevent dehydration or heat stroke. Body water is lost during exercise and must be replaced.  Work out until you breathe faster and your heart beats faster. This information is not intended to replace advice given to you by your health care provider. Make sure you discuss any questions you have with your health care provider. Document Released: 11/16/2010 Document Revised: 03/21/2016 Document Reviewed: 03/17/2014 Elsevier Interactive Patient Education  2018 Elsevier Inc.  

## 2018-09-04 LAB — CMP14+EGFR
A/G RATIO: 1.2 (ref 1.2–2.2)
ALBUMIN: 4.1 g/dL (ref 3.5–5.5)
ALT: 9 IU/L (ref 0–32)
AST: 12 IU/L (ref 0–40)
Alkaline Phosphatase: 71 IU/L (ref 39–117)
BUN / CREAT RATIO: 14 (ref 9–23)
BUN: 14 mg/dL (ref 6–24)
CHLORIDE: 103 mmol/L (ref 96–106)
CO2: 23 mmol/L (ref 20–29)
Calcium: 9.4 mg/dL (ref 8.7–10.2)
Creatinine, Ser: 0.98 mg/dL (ref 0.57–1.00)
GFR calc non Af Amer: 67 mL/min/{1.73_m2} (ref >59)
GFR, EST AFRICAN AMERICAN: 77 mL/min/{1.73_m2} (ref >59)
Globulin, Total: 3.4 g/dL (ref 1.5–4.5)
Glucose: 78 mg/dL (ref 65–99)
POTASSIUM: 4.2 mmol/L (ref 3.5–5.2)
Sodium: 141 mmol/L (ref 134–144)
TOTAL PROTEIN: 7.5 g/dL (ref 6.0–8.5)

## 2018-09-04 LAB — LIPID PANEL
CHOLESTEROL TOTAL: 238 mg/dL — AB (ref 100–199)
Chol/HDL Ratio: 4.2 ratio (ref 0.0–4.4)
HDL: 57 mg/dL (ref >39)
LDL Calculated: 165 mg/dL — ABNORMAL HIGH (ref 0–99)
TRIGLYCERIDES: 79 mg/dL (ref 0–149)
VLDL Cholesterol Cal: 16 mg/dL (ref 5–40)

## 2018-09-04 LAB — CBC
Hematocrit: 36.1 % (ref 34.0–46.6)
Hemoglobin: 11.4 g/dL (ref 11.1–15.9)
MCH: 25.4 pg — ABNORMAL LOW (ref 26.6–33.0)
MCHC: 31.6 g/dL (ref 31.5–35.7)
MCV: 80 fL (ref 79–97)
Platelets: 385 10*3/uL (ref 150–450)
RBC: 4.49 x10E6/uL (ref 3.77–5.28)
RDW: 15.1 % (ref 12.3–15.4)
WBC: 10.7 10*3/uL (ref 3.4–10.8)

## 2018-09-04 LAB — HEMOGLOBIN A1C
Est. average glucose Bld gHb Est-mCnc: 108 mg/dL
Hgb A1c MFr Bld: 5.4 % (ref 4.8–5.6)

## 2018-09-04 LAB — T4, FREE: FREE T4: 1.83 ng/dL — AB (ref 0.82–1.77)

## 2018-09-04 LAB — TSH: TSH: 0.516 u[IU]/mL (ref 0.450–4.500)

## 2018-09-04 NOTE — Progress Notes (Signed)
Here are your lab results:  Your liver function is normal.Your kidney function has improved. Please try to stay well hydrated. Your blood count has improved. Your cholesterol is quite elevated. Your LDL, bad cholesterol is 165. Ideally, this should be less than 100.  Please avoid fried foods and incorporate more exercise into your daily routine. Your hba1c is 5.4, you are no longer prediabetic. Congratulations!  Your thyroid meds need to be decreased. Your free t4 is too high. I would like for you to start Synthroid 112mcg once daily. If you are currently taking brand name Synthroid, you may come to pick up samples of 159mcg Synthroid from the office tomorrow, Sat 11/9 between 10-12 am. Please call [phone] to confirm we do have this dose in stock. Please skip your dose on Sat am. I would like for you to schedule a lab appt in six weeks for [place] to recheck your thyroid levels.   Please let me know if you have any questions or concerns.   Sincerely,    Serrena Linderman [facility], MD

## 2018-09-07 ENCOUNTER — Encounter: Payer: Self-pay | Admitting: Internal Medicine

## 2018-09-08 ENCOUNTER — Telehealth: Payer: Self-pay

## 2018-09-08 ENCOUNTER — Encounter: Payer: Self-pay | Admitting: Podiatry

## 2018-09-08 ENCOUNTER — Other Ambulatory Visit: Payer: Self-pay | Admitting: Neurology

## 2018-09-08 ENCOUNTER — Encounter: Payer: BC Managed Care – PPO | Admitting: Podiatry

## 2018-09-08 ENCOUNTER — Ambulatory Visit (INDEPENDENT_AMBULATORY_CARE_PROVIDER_SITE_OTHER): Payer: BC Managed Care – PPO | Admitting: Podiatry

## 2018-09-08 DIAGNOSIS — M7751 Other enthesopathy of right foot: Secondary | ICD-10-CM

## 2018-09-08 DIAGNOSIS — J4540 Moderate persistent asthma, uncomplicated: Secondary | ICD-10-CM

## 2018-09-08 DIAGNOSIS — G4733 Obstructive sleep apnea (adult) (pediatric): Secondary | ICD-10-CM

## 2018-09-08 DIAGNOSIS — Z6841 Body Mass Index (BMI) 40.0 and over, adult: Secondary | ICD-10-CM

## 2018-09-08 DIAGNOSIS — Z9889 Other specified postprocedural states: Secondary | ICD-10-CM

## 2018-09-08 DIAGNOSIS — R634 Abnormal weight loss: Secondary | ICD-10-CM

## 2018-09-08 DIAGNOSIS — Z9989 Dependence on other enabling machines and devices: Principal | ICD-10-CM

## 2018-09-08 DIAGNOSIS — M722 Plantar fascial fibromatosis: Secondary | ICD-10-CM

## 2018-09-08 NOTE — Telephone Encounter (Signed)
[employer] state plan denied HST but will cover in lab study. Need order for split night.

## 2018-09-08 NOTE — Telephone Encounter (Signed)
Order placed for the patient.

## 2018-09-09 ENCOUNTER — Telehealth: Payer: Self-pay | Admitting: *Deleted

## 2018-09-09 DIAGNOSIS — M722 Plantar fascial fibromatosis: Secondary | ICD-10-CM

## 2018-09-09 DIAGNOSIS — Z9889 Other specified postprocedural states: Secondary | ICD-10-CM

## 2018-09-09 NOTE — Telephone Encounter (Signed)
Hand delivered orders to [facility] - In-office.

## 2018-09-09 NOTE — Telephone Encounter (Signed)
-----   Message from Rip Harbour, [facility] sent at 09/08/2018  3:20 PM EST ----- Regarding: [facility] - In house  S/p EPF right DOS 07-24-18  Evaluate and treat 3 x wk x 4 wks

## 2018-09-09 NOTE — Progress Notes (Signed)
She presents today for a postop visit date of surgery 07/24/2018 status post endoscopic plantar fasciotomy states that she her heel is doing a little better though it is still exquisitely painful on long ambulation.  She states that now her worst part is the plantar lateral aspect of her right foot.  She denies fever chills nausea vomiting muscle aches pains denies any trauma to the foot.  Objective: Vital signs are stable she is alert and oriented x3.  Much decrease in edema erythema to the plantar aspect of the right foot mildly tender on palpation at the endoscopic plantar fasciotomy site.  However she does have pain on palpation sub-fifth metatarsal base right foot.  This is most likely compensatory in nature due to her long-standing gait abnormality for many years and just recently having the fasciotomy done able to take her a while before she gets back to normal gait.  Assessment: Bursitis capsulitis fifth metatarsal base right.  Resolving endoscopic plantar fasciotomy pain right.  Plan: At this point I will request that she start physical therapy also injected 10 mg of Kenalog 5 mg Marcaine after sterile Betadine skin prep sub-fifth met base right.  Tolerated procedure well without complications.  We may follow-up with her in 1 month.  We may allow her to start going back to work light duty over the next 2 to 4 weeks but I would like her to start physical therapy first.

## 2018-09-20 ENCOUNTER — Ambulatory Visit (INDEPENDENT_AMBULATORY_CARE_PROVIDER_SITE_OTHER): Payer: BC Managed Care – PPO | Admitting: Neurology

## 2018-09-20 DIAGNOSIS — G4733 Obstructive sleep apnea (adult) (pediatric): Secondary | ICD-10-CM

## 2018-09-20 DIAGNOSIS — Z6841 Body Mass Index (BMI) 40.0 and over, adult: Principal | ICD-10-CM

## 2018-09-20 DIAGNOSIS — R634 Abnormal weight loss: Secondary | ICD-10-CM

## 2018-09-20 DIAGNOSIS — J4540 Moderate persistent asthma, uncomplicated: Secondary | ICD-10-CM

## 2018-09-21 ENCOUNTER — Encounter: Payer: BC Managed Care – PPO | Admitting: Internal Medicine

## 2018-09-21 ENCOUNTER — Telehealth: Payer: Self-pay | Admitting: Podiatry

## 2018-09-22 ENCOUNTER — Ambulatory Visit (INDEPENDENT_AMBULATORY_CARE_PROVIDER_SITE_OTHER): Payer: Self-pay | Admitting: Podiatry

## 2018-09-22 ENCOUNTER — Encounter: Payer: BC Managed Care – PPO | Admitting: Podiatry

## 2018-09-22 ENCOUNTER — Encounter: Payer: Self-pay | Admitting: Podiatry

## 2018-09-22 DIAGNOSIS — Z9889 Other specified postprocedural states: Secondary | ICD-10-CM

## 2018-09-22 DIAGNOSIS — M722 Plantar fascial fibromatosis: Secondary | ICD-10-CM

## 2018-09-22 NOTE — Progress Notes (Signed)
She presents today for follow-up of her surgical foot.  States the injection really helped for only about a day or so and then the pain returned immediately states that physical therapy has been the most effective in [place] can tell a big difference of the progression of her surgical foot right.  She is status post endoscopic plantar fasciotomy.  Objective: Vital signs are stable alert and oriented x3.  She has pain on palpation to the lateral incision port there is no signs of infection no purulence no drainage is not dehisced is not open.  She has some tenderness at the plantar release but not nearly as painful as it has been prior to surgery.  Assessment: Slowly healing endoscopic plantar fasciotomy.  Plan: I am going to encourage her to continue physical therapy for a while longer before going back to work light duty for at least 1 month I will see her in 1 month.

## 2018-09-22 NOTE — Telephone Encounter (Signed)
ERROR

## 2018-09-28 ENCOUNTER — Encounter: Payer: Self-pay | Admitting: Podiatry

## 2018-09-30 NOTE — Procedures (Signed)
PATIENT'S NAME:  Prisilla, Kocsis DOB:      27-Feb-1966      MR#:    [id number]     DATE OF RECORDING: 09/20/2018  [facility] REFERRING M.D.:  Glendale Chard, MD Study Performed:   Baseline Polysomnogram HISTORY:  Ms. Cappello is a 52 year old female with a history of obstructive sleep apnea on CPAP.  She reports that since her gastric sleeve surgery she has lost approximately 55 pounds.  Her CPAP download indicates that she use her machine 21 out of 30 days.  She used her machine greater than 4 hours for 15 days / compliance to 15%.  On average she uses her machine 6 hours and 11 minutes.  Her residual AHI is 2 on pressures between 5 to 10 cm of water with EPR of 1 cm.  Her air leak in the 95th percentile was 33 L/min.  She states that when she got new supplies the mask did not fit right.  In the past week she has had a mask refitting and has a new mask.   Her main risk factor has been her elevated body mass index, the patient is a thyroid cancer survivor and weight loss has been difficult since thyroidectomy. She had weight loss surgery by Dr. Hassell Done at [facility] (gastric sleeve procedure this spring 2019),  The patient endorsed the Epworth Sleepiness Scale at 5/24 points.   The patient's weight 220 pounds with a height of 64 (inches), resulting in a BMI of 37.6 kg/m2. The patient's neck circumference measured 17 inches.  CURRENT MEDICATIONS: Neurontin, Synthroid, Multivitamins, Cozaar, Cleocin, Xyzal, Cytomel, Glucophage, Nasonex, Singulair, Zofran, Protonix, Ultram.   PROCEDURE:  This is a multichannel digital polysomnogram utilizing the Somnostar 11.2 system.  Electrodes and sensors were applied and monitored per AASM Specifications.   EEG, EOG, Chin and Limb EMG, were sampled at 200 Hz.  ECG, Snore and Nasal Pressure, Thermal Airflow, Respiratory Effort, CPAP Flow and Pressure, Oximetry was sampled at 50 Hz. Digital video and audio were recorded.      BASELINE STUDY: Lights Out was at 20:52 and  Lights On at 04:59.  Total recording time (TRT) was 487 minutes, with a total sleep time (TST) of 335.5 minutes.   The patient's sleep latency was 35 minutes.  REM latency was 61 minutes.  The sleep efficiency was 68.9 %.     SLEEP ARCHITECTURE: WASO (Wake after sleep onset) was 116 minutes. There were 2 minutes in Stage N1, 221 minutes Stage N2, 39 minutes Stage N3 and 73.5 minutes in Stage REM.  The percentage of Stage N1 was 0.6%, Stage N2 was 65.9%, Stage N3 was 11.6% and Stage R (REM sleep) was 21.9%.   RESPIRATORY ANALYSIS:  There were a total of 19 respiratory events:  0 obstructive apneas, 1 central apnea, and 18 hypopneas with 0 respiratory event related arousals (RERAs).     The total APNEA/HYPOPNEA INDEX (AHI) was 3.4 /hour and the total RESPIRATORY DISTURBANCE INDEX was 3.4 /hour.  11 events occurred in REM sleep and 15 events in NREM. The REM AHI was 9.0 /hour, versus a non-REM AHI of 1.8. The patient spent 211.5 minutes of total sleep time in the supine position and 124 minutes in non-supine. The supine AHI was 3.4 versus a non-supine AHI of 3.4/h.  OXYGEN SATURATION & C02:  The Wake baseline 02 saturation was 96%, with the lowest being 91%. Time spent below 89% saturation equaled 0 minutes.  AROUSALS: The arousals were noted as: 56 were  spontaneous, 0 were associated with PLMs, and 5 were associated with respiratory events. Audio and video analysis did not show any abnormal or unusual movements, behaviors, phonations or vocalizations.   EKG was in keeping with normal sinus rhythm (NSR).  IMPRESSION:  1. Complete Resolution of Obstructive Sleep Apnea (OSA) following weight loss surgery 6-18- 2019.   RECOMMENDATIONS:  1. No longer in need of CPAP therapy.    I certify that I have reviewed the entire raw data recording prior to the issuance of this report in accordance with the Standards of Accreditation of the American Academy of Sleep Medicine (AASM)   Larey Seat, MD    09-30-2018  Diplomat, American Board of Psychiatry and Neurology  Diplomat, American Board of Muse Director, [employer] [employer]

## 2018-10-01 ENCOUNTER — Telehealth: Payer: Self-pay | Admitting: Neurology

## 2018-10-01 ENCOUNTER — Encounter: Payer: Self-pay | Admitting: Neurology

## 2018-10-01 ENCOUNTER — Other Ambulatory Visit: Payer: Self-pay | Admitting: Neurology

## 2018-10-01 DIAGNOSIS — R634 Abnormal weight loss: Secondary | ICD-10-CM

## 2018-10-01 DIAGNOSIS — G4733 Obstructive sleep apnea (adult) (pediatric): Secondary | ICD-10-CM

## 2018-10-01 DIAGNOSIS — Z9989 Dependence on other enabling machines and devices: Principal | ICD-10-CM

## 2018-10-01 NOTE — Telephone Encounter (Signed)
-----   Message from Larey Seat, MD sent at 09/30/2018  6:40 PM EST ----- No need to SPLIT this PSG into CPAP titration- Apnea is no longer of clinical significance. Patient does not need to continue use of CPAP.  Congratulations, you weight loss has cured your sleep apnea!

## 2018-10-01 NOTE — Telephone Encounter (Signed)
Called the patient, there was no answer, LVM instructing the patient to call back.  If patient calls back please advise that her recent sleep study showed that apnea is no longer present and CPAP is no longer indicated. I have sent an order to [facility] for them to DC CPAP for the patient.

## 2018-10-02 ENCOUNTER — Other Ambulatory Visit: Payer: Self-pay

## 2018-10-02 ENCOUNTER — Telehealth: Payer: Self-pay

## 2018-10-02 MED ORDER — LEVOTHYROXINE SODIUM 112 MCG PO TABS: 112.0000 ug | ORAL_TABLET | Freq: Every day | ORAL | 1 refills | Status: DC

## 2018-10-02 NOTE — Telephone Encounter (Signed)
The pt was given her lab results and said that she has a follow-up with Dr. Chalmers Cater on hte 10th for a follow-up on her thyroid.

## 2018-10-02 NOTE — Telephone Encounter (Signed)
pls be sure a copy of her labs have been faxed to Dr Chalmers Cater. thanks

## 2018-10-05 NOTE — Telephone Encounter (Signed)
Labs faxed 10/05/2018 to Dr. Chalmers Cater.

## 2018-10-05 NOTE — Telephone Encounter (Signed)
Advised patient of previous message and she had no questions.

## 2018-10-06 ENCOUNTER — Encounter: Payer: BC Managed Care – PPO | Attending: Surgery | Admitting: Skilled Nursing Facility1

## 2018-10-06 DIAGNOSIS — Z6841 Body Mass Index (BMI) 40.0 and over, adult: Secondary | ICD-10-CM | POA: Insufficient documentation

## 2018-10-06 DIAGNOSIS — E669 Obesity, unspecified: Secondary | ICD-10-CM

## 2018-10-06 DIAGNOSIS — Z713 Dietary counseling and surveillance: Secondary | ICD-10-CM | POA: Insufficient documentation

## 2018-10-08 NOTE — Progress Notes (Signed)
Follow-up visit:  Post-Operative Sleeve Surgery  Medical Nutrition Therapy:  Appt start time: 6:00pm end time:  7:00pm  Primary concerns today: Post-operative Bariatric Surgery Nutrition Management 6 Month Post-Op Class  Surgery date: 04/14/2018 Surgery type: Sleeve Start weight at [facility]: 266.2 Weight today: 213.2  TANITA  BODY COMP RESULTS  04/28/2018 06/08/2018 08/10/2018   BMI (kg/m^2) 42.8 41.7 37.1   Fat Mass (lbs) 130.8 122.8 110   Fat Free Mass (lbs) 118.8 120.2 113.2   Total Body Water (lbs) 87.2 87.8 82     Information Reviewed/ Discussed During Appointment: -Review of composition scale numbers -Fluid requirements (64-100 ounces) -Protein requirements (60-80g) -Strategies for tolerating diet -Advancement of diet to include Starchy vegetables -Barriers to inclusion of new foods -Inclusion of appropriate multivitamin and calcium supplements  -Exercise recommendations   Fluid intake: adequate  Medications: see list Supplementation: appropriate  CBG monitoring:  Average CBG per patient: Last patient reported A1c: 5.4  Using straws: no Drinking while eating: no Having you been chewing well:no Chewing/swallowing difficulties: no Changes in vision: o Changes to mood/headaches: no Hair loss/Cahnges to skin/Changes to nails: no Any difficulty focusing or concentrating: no Sweating: no Dizziness/Lightheaded:  Palpitations: no  Carbonated beverages: no N/V/D/C/GAS: no Abdominal Pain: no  Recent physical activity:  Adequate   Progress Towards Goal(s):  In progress.  Handouts given during visit include:  Phase V diet Progression   Goals Sheet  The Benefits of Exercise are endless.....  Support Group Topics  Pt Chosen Goals:  I want to lower my cholesterol  remember to take all meds/vitmamins I will eat 1 new non starchy veggie every day by 11/06/18  Teaching Method Utilized: Visual Auditory Hands on   Demonstrated degree of understanding via:   Teach Back   Monitoring/Evaluation:  Dietary intake, exercise, and body weight. Follow up in 3 months for 9 month post-op visit.

## 2018-10-15 ENCOUNTER — Ambulatory Visit (INDEPENDENT_AMBULATORY_CARE_PROVIDER_SITE_OTHER): Payer: BC Managed Care – PPO | Admitting: Podiatry

## 2018-10-15 DIAGNOSIS — Z9889 Other specified postprocedural states: Secondary | ICD-10-CM

## 2018-10-15 DIAGNOSIS — M722 Plantar fascial fibromatosis: Secondary | ICD-10-CM

## 2018-10-15 NOTE — Progress Notes (Signed)
She presents today for follow-up of plantar fasciitis and and EPF to the right foot.  She states that is improved by about 60%.  States that occasionally she still gets some pain to the lateral aspect of the foot because she is been walking too much.  But has continued to take physical therapy.  Objective: Vital signs are stable she is alert and oriented x3.  There is no erythema edema cellulitis drainage or odor she has no pain on palpation medial continue tubercle or lateral aspect of the right foot.  Assessment: Well-healing surgical foot.  Plan: Follow-up with me on an as-needed basis.

## 2018-11-02 ENCOUNTER — Other Ambulatory Visit: Payer: Self-pay | Admitting: Internal Medicine

## 2018-11-10 ENCOUNTER — Ambulatory Visit: Payer: Self-pay

## 2018-12-07 IMAGING — US US SOFT TISSUE HEAD/NECK
1 series · 13 of 25 positions shown · non-contrast
Comparison: None.

CLINICAL DATA: Lateral neck swelling, fullness

EXAM:
ULTRASOUND OF HEAD/NECK SOFT TISSUES
TECHNIQUE: Ultrasound examination of the head and neck soft tissues was
performed in the area of clinical concern.

[Series 1: us soft tissue head/neck · 0.06mm/px · 50 acquisitions, 13 frames shown]
[im 1/50]
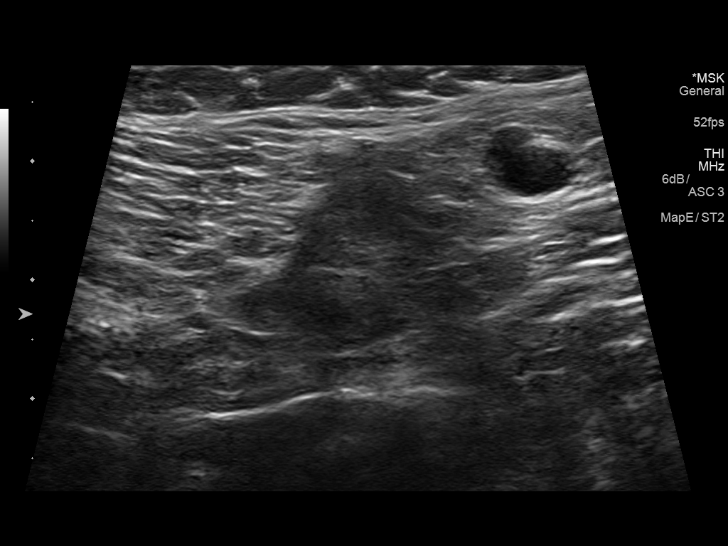
[im 5/50]
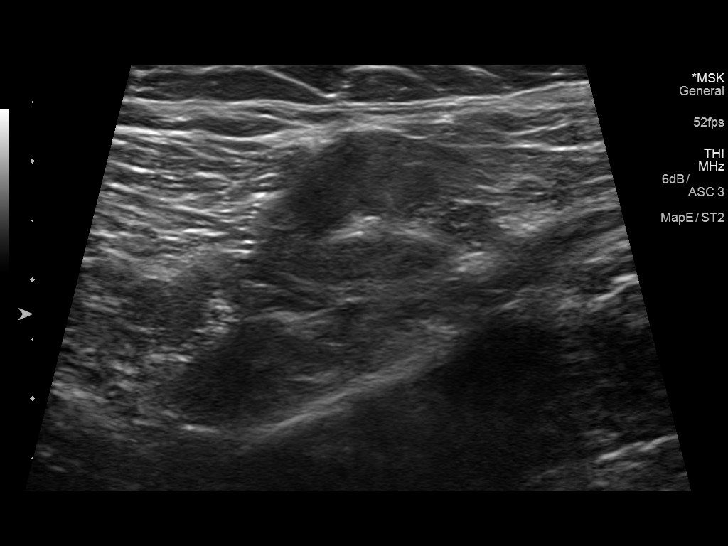
[im 9/50]
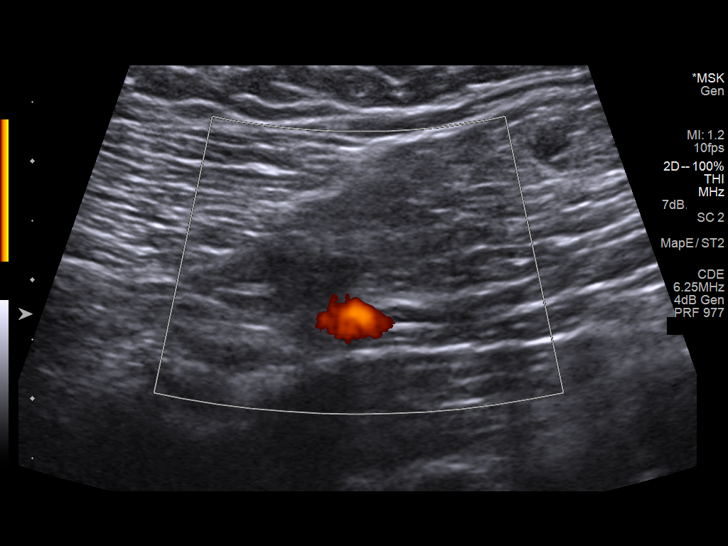
[im 13/50]
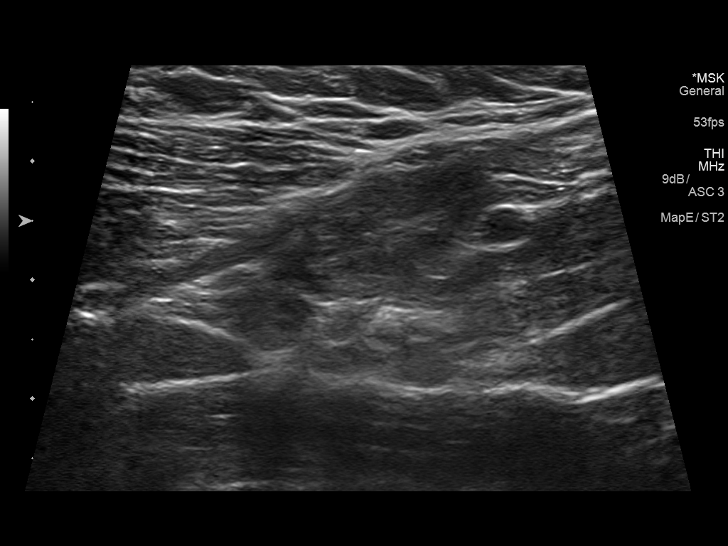
[im 17/50]
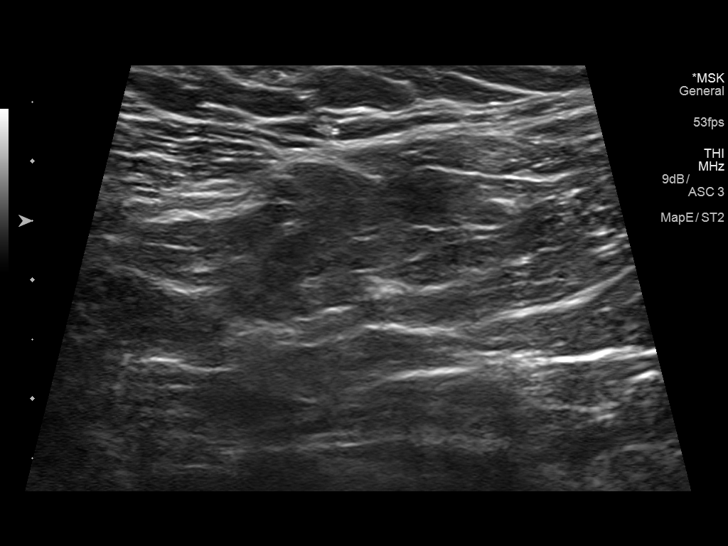
[im 21/50]
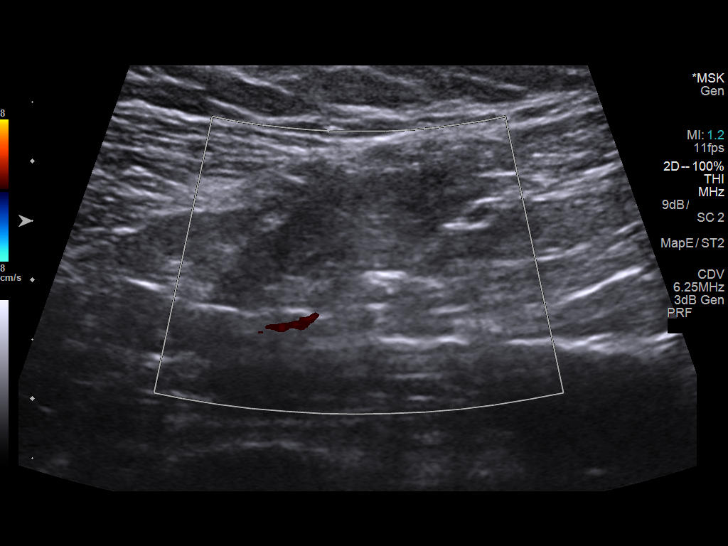
[im 25/50]
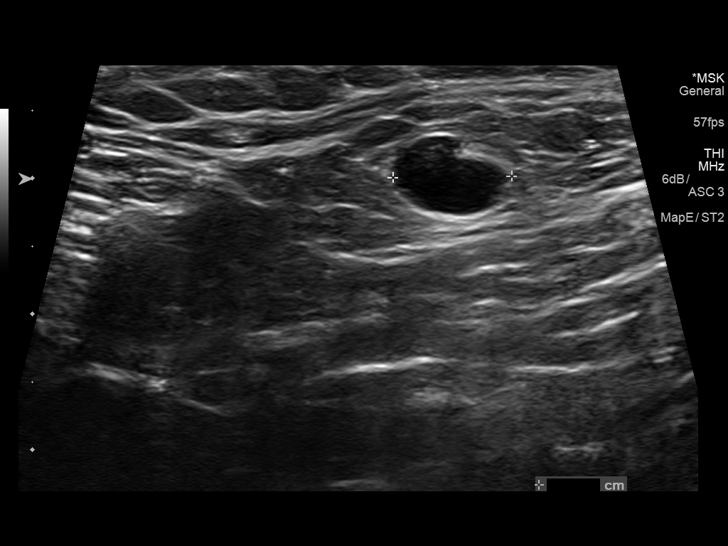
[im 29/50]
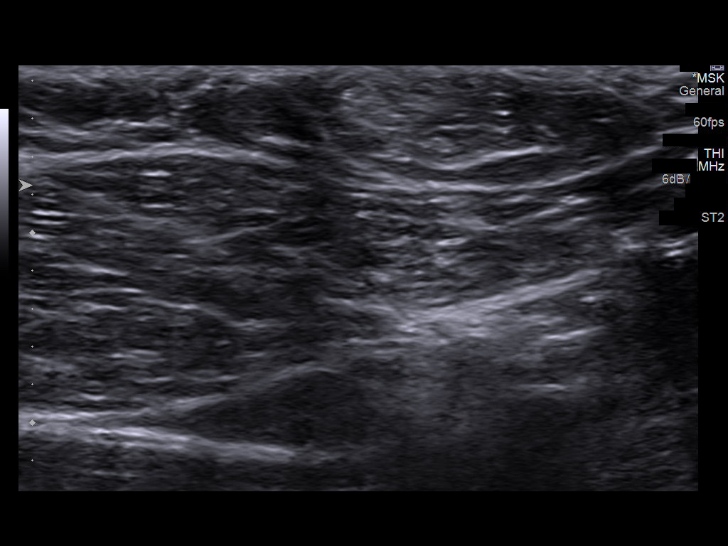
[im 33/50]
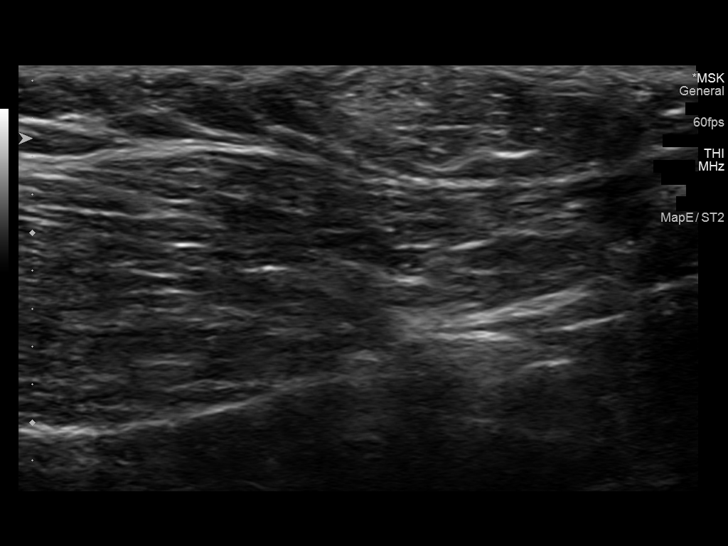
[im 37/50]
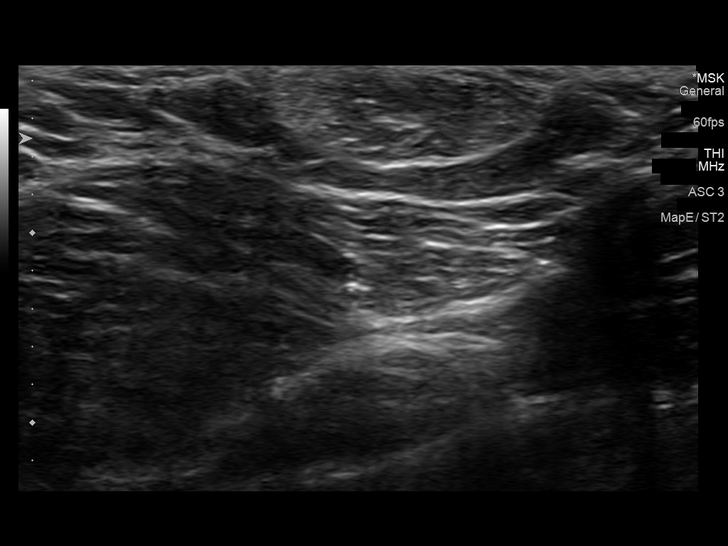
[im 41/50]
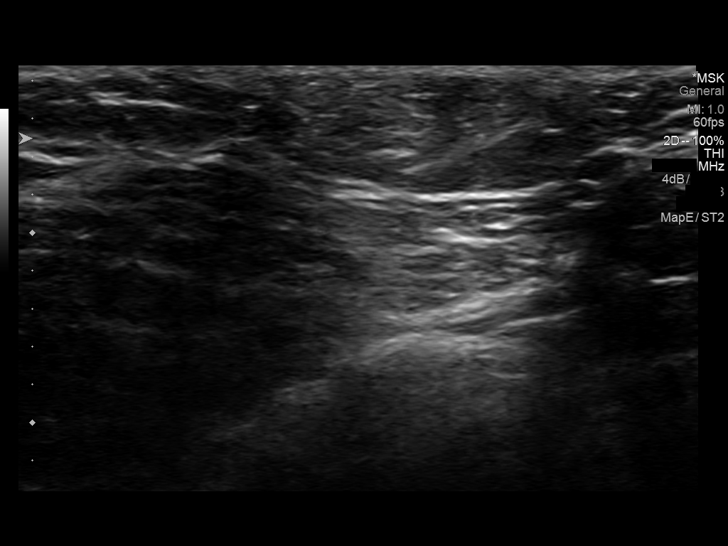
[im 45/50]
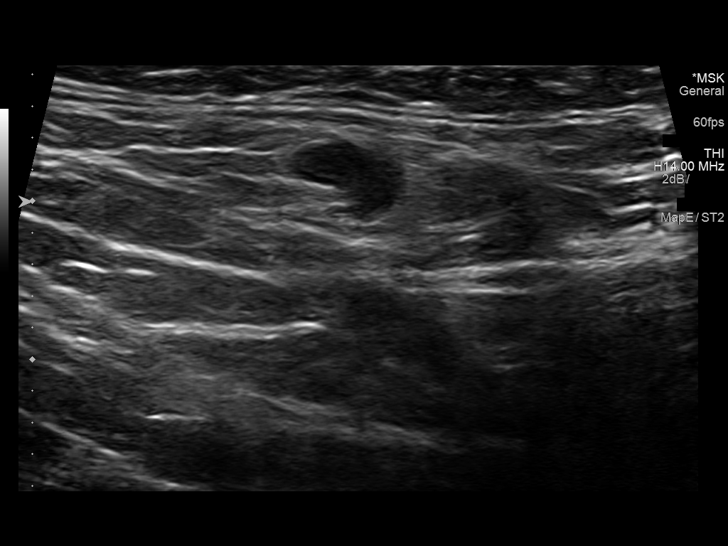
[im 50/50]
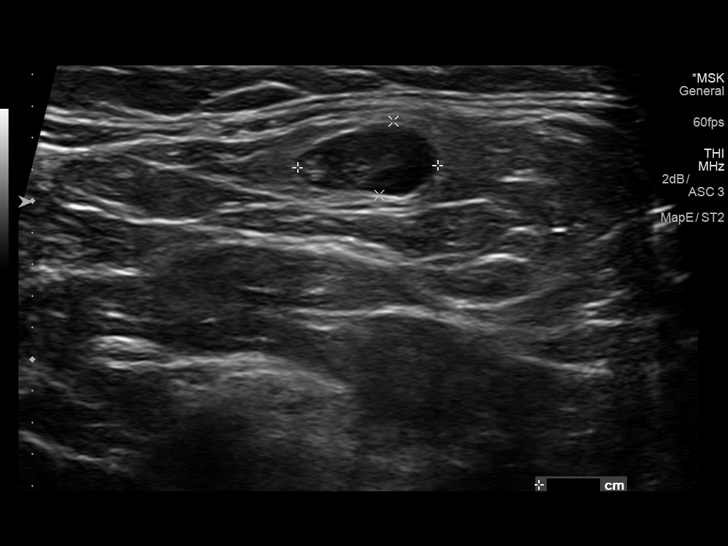

[13 of 25 positions shown; findings below may reference images not displayed]

FINDINGS: Superficial soft tissue ultrasound performed of the right lateral
neck supraclavicular area.

Right supraclavicular palpable abnormality correlates with
indistinct ill-defined triangular hypoechoic area measuring 2.0 x
1.3 x 1.9 cm within the subcutaneous tissues. No associated
vascularity. Appearance remains nonspecific by ultrasound but may
represent a lipoma. Adjacent superficial benign-appearing lymph
nodes noted all measuring 9 mm or less in size.

A second superficial palpable abnormality noted in the right lateral
neck correlates with a subcutaneous echogenic oval area just deep to
the skin measuring 1.5 x 0.6 x 1.4 cm. This also remains
indeterminate but suspicious for lipoma.
IMPRESSION: Palpable right supraclavicular abnormalities correlate with
indistinct subcutaneous hypoechoic and echogenic areas within the
adipose tissue, suspicious for lipomas. Consider further evaluation
with neck CT with contrast.

Benign-appearing supraclavicular cervical lymph nodes bilaterally.

## 2018-12-21 ENCOUNTER — Ambulatory Visit: Payer: Self-pay | Admitting: Cardiology

## 2018-12-27 NOTE — Progress Notes (Signed)
Patient referred by Jacelyn Pi, MD for palpitations  Subjective:   Dana Vasquez, female    DOB: 1966/02/22, 53 y.o.   MRN: [id number]   Chief Complaint  Patient presents with  . Palpitations    pt c/o palpitations stating in june after her surgery, now not so frequent, diziness    HPI  53 y.o. African American female with s/p gastric sleeve surgery, no longer on medications for hypertension, hyperlipidemia, prediabetes, malignant neoplasm of thyroid glad (1995) treated with near total thyroidectomy + radioactive iodine ablation, postprocedural hypothyroidism, referred for evaluation of palpitations.  Patient reports palptations-"flutter/skipping a beat" unrelated to exertion, associated with several dizziness spells without syncope. She has had these symptoms since her gastric sleeve surgery in 03/2018. They have improved somewhat in the recent past. She also reports chest pain, unrelated to exertion.  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 She also teaches middle school. Stays very active with community involvement and reports stress related to busy lifestyle.   Labs 11/16/2018: Glucose 94. BUN/Cr 17/1.0. eGFR 61. Na/K 145/3.7 Chol 200, TG 35, HDL 60, LDL 133 HbA1C 5.7% TSH 0.8 normal  Past Medical History:  Diagnosis Date  . Allergy   . Anemia   . Arthritis    knees  AND SHOULDERS  . Asthma    allergy related - rarely uses inhaler  . Bruises easily   . Bunion   . Cancer (HCC)    1995;thyroid cancer, SURGERY AND RADIOACTIVE IODINE  . Ectopic pregnancy   . Euthyroid   . Fatigue   . Fibromyalgia   . GERD (gastroesophageal reflux disease)   . Headache(784.0)    otc med prn - last one 02/2014  . History of blood transfusion 2000   In [place], [place] at [facility] - ? 4 units transfused  . Hypothyroidism   . Knee pain   . Plantar fasciitis   . Sleep apnea    uses CPAP  . SVD (spontaneous vaginal delivery)      x 3  . Thyroid disease    thyroidectomy 1995     Past Surgical History:  Procedure Laterality Date  . ABDOMINAL HYSTERECTOMY N/A 04/20/2014   Procedure: Total ABDOMINAL HYSTERECTOMY partial right salpingectomy;  Surgeon: Eldred Manges, MD;  Location: [facility];  Service: Gynecology;  Laterality: N/A;  . BREATH TEK H PYLORI  09/18/2011   Procedure: BREATH TEK H PYLORI;  Surgeon: Pedro Earls, MD;  Location: [facility];  Service: General;  Laterality: N/A;  . BUNIONECTOMY     left;2015  . BUNIONECTOMY    . COLONOSCOPY    . DILATION AND CURETTAGE OF UTERUS  1988   endometriosis  . ECTOPIC PREGNANCY SURGERY    . fallopian tubes removed     left tube removed per patient - laparotomy  . FOOT SURGERY Right 2019  . FOOT SURGERY Bilateral   . LAPAROSCOPIC GASTRIC SLEEVE RESECTION N/A 04/14/2018   Procedure: LAPAROSCOPIC GASTRIC SLEEVE RESECTION WITH UPPER ENDO AND ERAS PATHWAY;  Surgeon: Johnathan Hausen, MD;  Location: [facility];  Service: General;  Laterality: N/A;  . LYSIS OF ADHESION N/A 04/20/2014   Procedure: LYSIS OF ADHESION;  Surgeon: Eldred Manges, MD;  Location: [facility];  Service: Gynecology;  Laterality: N/A;  . THYROIDECTOMY  1995  . TUBAL LIGATION    . WISDOM TOOTH EXTRACTION       Social History   Socioeconomic History  . Marital status: Married  Spouse name: Not on file  . Number of children: Not on file  . Years of education: Not on file  . Highest education level: Not on file  Occupational History  . Not on file  Social Needs  . Financial resource strain: Not on file  . Food insecurity:    Worry: Not on file    Inability: Not on file  . Transportation needs:    Medical: Not on file    Non-medical: Not on file  Tobacco Use  . Smoking status: Never Smoker  . Smokeless tobacco: Never Used  Substance and Sexual Activity  . Alcohol use: Yes    Alcohol/week: 0.0 standard drinks    Comment: socially; every 3-4 months  . Drug use: No  . Sexual  activity: Yes    Birth control/protection: Surgical  Lifestyle  . Physical activity:    Days per week: Not on file    Minutes per session: Not on file  . Stress: Not on file  Relationships  . Social connections:    Talks on phone: Not on file    Gets together: Not on file    Attends religious service: Not on file    Active member of club or organization: Not on file    Attends meetings of clubs or organizations: Not on file    Relationship status: Not on file  . Intimate partner violence:    Fear of current or ex partner: Not on file    Emotionally abused: Not on file    Physically abused: Not on file    Forced sexual activity: Not on file  Other Topics Concern  . Not on file  Social History Narrative  . Not on file     Current Outpatient Medications on File Prior to Visit  Medication Sig Dispense Refill  . atorvastatin (LIPITOR) 10 MG tablet Take 10 mg by mouth daily.    Marland Kitchen levothyroxine (SYNTHROID) 112 MCG tablet Take 1 tablet (112 mcg total) by mouth daily. 30 tablet 1  . Multiple Vitamins-Minerals (BARIATRIC MULTIVITAMINS/IRON) CAPS Take by mouth.    . naproxen (NAPROSYN) 500 MG tablet Take 500 mg by mouth as needed.      No current facility-administered medications on file prior to visit.     Cardiovascular studies:  EKG 12/28/2018: Sinus rhythm with two PAC's. Normal axis. Normal conduction.  Review of Systems  Constitution: Negative for decreased appetite, malaise/fatigue, weight gain and weight loss.  HENT: Negative for congestion.   Eyes: Negative for visual disturbance.  Cardiovascular: Positive for palpitations. Negative for chest pain, dyspnea on exertion, leg swelling and syncope.  Respiratory: Negative for shortness of breath.   Endocrine: Negative for cold intolerance.  Hematologic/Lymphatic: Does not bruise/bleed easily.  Skin: Negative for itching and rash.  Musculoskeletal: Negative for myalgias.  Gastrointestinal: Negative for abdominal pain,  nausea and vomiting.  Genitourinary: Negative for dysuria.  Neurological: Negative for dizziness and weakness.  Psychiatric/Behavioral: The patient is not nervous/anxious.   All other systems reviewed and are negative.        Vitals:   12/28/18 1509 12/28/18 1512  BP: 115/77 126/75  Pulse: 94 69  SpO2: 95% 95%    Objective:   Physical Exam  Constitutional: She is oriented to person, place, and time. She appears well-developed and well-nourished. No distress.  HENT:  Head: Normocephalic and atraumatic.  Eyes: Pupils are equal, round, and reactive to light. Conjunctivae are normal.  Neck: No JVD present.  Cardiovascular: Normal rate, regular rhythm  and intact distal pulses.  No murmur heard. Pulmonary/Chest: Effort normal and breath sounds normal. She has no wheezes. She has no rales.  Abdominal: Soft. Bowel sounds are normal. There is no rebound.  Musculoskeletal:        General: No edema.  Lymphadenopathy:    She has no cervical adenopathy.  Neurological: She is alert and oriented to person, place, and time. No cranial nerve deficit.  Skin: Skin is warm and dry.  Psychiatric: She has a normal mood and affect.  Nursing note and vitals reviewed.         Assessment & Recommendations:   53 y/o [place] American female with hypertension, hyperlipidemia, type 2 DM, malignant neoplasm of thyroid glad, postprocedural hypothyroidism, referred for evaluation of palpitations.  Palpitations: EKG shows PAC. Recommend echocardiogram to rule out structural abnormality, and event monitor to rule out Afib. She does not want to start any medications at this time.   Atypical chest pain: Recommend exercise treadmill stress test   Thank you for referring the patient to [place]. Please feel free to contact with any questions.  Nigel Mormon, MD [facility] [place] Pager: [phone] Office: [phone] If no answer Cell [phone]

## 2018-12-28 ENCOUNTER — Ambulatory Visit: Payer: BC Managed Care – PPO | Admitting: Cardiology

## 2018-12-28 ENCOUNTER — Ambulatory Visit: Payer: BC Managed Care – PPO

## 2018-12-28 ENCOUNTER — Encounter: Payer: Self-pay | Admitting: Cardiology

## 2018-12-28 VITALS — BP 113/80 | HR 71 | Ht 65.0 in | Wt 204.0 lb

## 2018-12-28 DIAGNOSIS — R002 Palpitations: Secondary | ICD-10-CM | POA: Diagnosis not present

## 2018-12-28 DIAGNOSIS — I491 Atrial premature depolarization: Secondary | ICD-10-CM

## 2018-12-28 DIAGNOSIS — R0789 Other chest pain: Secondary | ICD-10-CM

## 2018-12-31 ENCOUNTER — Encounter: Payer: BC Managed Care – PPO | Attending: Surgery | Admitting: Skilled Nursing Facility1

## 2018-12-31 DIAGNOSIS — Z6841 Body Mass Index (BMI) 40.0 and over, adult: Secondary | ICD-10-CM | POA: Insufficient documentation

## 2018-12-31 DIAGNOSIS — Z713 Dietary counseling and surveillance: Secondary | ICD-10-CM | POA: Diagnosis not present

## 2018-12-31 DIAGNOSIS — N182 Chronic kidney disease, stage 2 (mild): Secondary | ICD-10-CM

## 2018-12-31 NOTE — Patient Instructions (Addendum)
Meal Ideas:   Tuna pack on a carb smart whole wheat tortilla with spinach in it (if cannot eat all at once save the other half for a snack later)   Chicken  Salad: mustard, mayo, sliced almonds, purple grapes, and whatever seasoning on a romaine boat   Green leafy salad with edamame    [employer] for more lunch ideas  Try sugar free natures twist   Aim for 4 bottles of water a day (MINIMUM)   Do not eat any beef or pork or fast food or butter   -Add ground flax to your foods

## 2018-12-31 NOTE — Progress Notes (Signed)
Follow-up visit:  Post-Operative Sleeve Surgery  Medical Nutrition Therapy:  Appt start time: 6:00pm end time:  7:00pm  Primary concerns today: Post-operative Bariatric Surgery Nutrition Management 6 Month Post-Op Class   Pt is currently wearing a heart monitor due to dizziness and heart flutters. Pt states her cholesterol will not go down. Pt states she started the BELT program Pt states she has been so busy she just grabs things to snack on and is not paying attention to how she is eating. Taking A carbonated probiotic.   Surgery date: 04/14/2018 Surgery type: Sleeve Start weight at [facility]: 266.2 Weight today: 200  24 hr recall: First meal: protein shake or scrambled eggs and meat or cottage cheese and peaches in light syrup or cheerios   Lunch: salmon or chicken salad  Snack: protein shake Dinner: salmon spinach pad thai noodles  Or soup and popcorn   Beverages: deaf coffee with creamer, water  Fluid intake: 50 oz  Medications: see list Supplementation: vitmain c powder, probiotic, multivitamin, calcium   CBG monitoring:  Average CBG per patient: Last patient reported A1c: 5.4  Using straws: no Drinking while eating: no Having you been chewing well:no Chewing/swallowing difficulties: no Changes in vision: o Changes to mood/headaches: no Hair loss/Cahnges to skin/Changes to nails: no Any difficulty focusing or concentrating: no Sweating: no Dizziness/Lightheaded:  Palpitations: no  Carbonated beverages: no N/V/D/C/GAS: no Abdominal Pain: no  Recent physical activity:  BELT  Progress Towards Goal(s):  In progress. Goals: Meal Ideas:   Tuna pack on a carb smart whole wheat tortilla with spinach in it (if cannot eat all at once save the other half for a snack later)   Chicken  Salad: mustard, mayo, sliced almonds, purple grapes, and whatever seasoning on a romaine boat   Green leafy salad with edamame    [employer] for more lunch ideas  Try sugar free  natures twist   Aim for 4 bottles of water a day (MINIMUM)   Do not eat any beef or pork or fast food or butter   -Add ground flax to your foods   Teaching Method Utilized: Visual Auditory Hands on   Demonstrated degree of understanding via:  Teach Back   Monitoring/Evaluation:  Dietary intake, exercise, and body weight. Follow up in 3 months

## 2019-01-06 ENCOUNTER — Ambulatory Visit: Payer: BC Managed Care – PPO

## 2019-01-06 DIAGNOSIS — R002 Palpitations: Secondary | ICD-10-CM | POA: Diagnosis not present

## 2019-01-06 DIAGNOSIS — R0789 Other chest pain: Secondary | ICD-10-CM | POA: Diagnosis not present

## 2019-01-13 ENCOUNTER — Other Ambulatory Visit: Payer: Self-pay

## 2019-01-13 ENCOUNTER — Ambulatory Visit: Payer: BC Managed Care – PPO

## 2019-01-13 ENCOUNTER — Telehealth: Payer: Self-pay

## 2019-01-13 DIAGNOSIS — R002 Palpitations: Secondary | ICD-10-CM

## 2019-01-13 DIAGNOSIS — I491 Atrial premature depolarization: Secondary | ICD-10-CM

## 2019-01-13 NOTE — Telephone Encounter (Signed)
Okay 

## 2019-01-14 NOTE — Telephone Encounter (Signed)
-----   Message from Chad Cordial sent at 01/13/2019  9:07 AM EDT ----- Regarding: necesity of testing Pt wants to know if her ECHO if necessary and if it is could she get it done somewhere else.

## 2019-01-19 DIAGNOSIS — R002 Palpitations: Secondary | ICD-10-CM | POA: Diagnosis not present

## 2019-01-27 ENCOUNTER — Other Ambulatory Visit: Payer: Self-pay | Admitting: Cardiology

## 2019-02-11 ENCOUNTER — Other Ambulatory Visit: Payer: Self-pay

## 2019-02-11 ENCOUNTER — Encounter: Payer: Self-pay | Admitting: Cardiology

## 2019-02-11 ENCOUNTER — Ambulatory Visit: Payer: BC Managed Care – PPO | Admitting: Cardiology

## 2019-02-11 VITALS — BP 113/80 | Ht 64.0 in | Wt 195.0 lb

## 2019-02-11 DIAGNOSIS — R002 Palpitations: Secondary | ICD-10-CM | POA: Diagnosis not present

## 2019-02-11 NOTE — Progress Notes (Signed)
Patient referred by Glendale Chard, MD for palpitations  Subjective:   Dana Vasquez, female    DOB: 11-22-1965, 53 y.o.   MRN: [id number]  I connected withthe patient on 04/16/20by a video enabled telemedicine application and verified that I am speaking with the correct person using two identifiers.    I discussed the limitations of evaluation and management by telemedicine and the availability of in person appointments. The patient expressed understanding and agreed to proceed.   This visit type was conducted due to national recommendations for restrictions regarding the COVID-19 Pandemic (e.g. social distancing). This format is felt to be most appropriate for this patient at this time. All issues noted in this document were discussed and addressed. No physical exam was performed (except for noted visual exam findings with Tele health visits). The patient has consented to conduct a Tele health visit and understands insurance will be billed.     Chief complaint:  Palpitations  HPI  53 y.o. African American female with s/p gastric sleeve surgery, no longer on medications for hypertension, hyperlipidemia, prediabetes, malignant neoplasm of thyroid glad (1995) treated with near total thyroidectomy + radioactive iodine ablation, postprocedural hypothyroidism, seen by me for palpitations.  Since her last visit, she has not had any significant palpitations symptoms. She wore event monitor only for a short duration, as she had significant skin abrasion with the adhesives. She has episodes of right sided shoulder chest pain, unrelated to exertion. Stress test was reassuring.    Past Medical History:  Diagnosis Date  . Allergy   . Anemia   . Arthritis    knees  AND SHOULDERS  . Asthma    allergy related - rarely uses inhaler  . Bruises easily   . Bunion   . Cancer (HCC)    1995;thyroid cancer, SURGERY AND RADIOACTIVE IODINE  . Ectopic pregnancy   . Euthyroid   .  Fatigue   . Fibromyalgia   . GERD (gastroesophageal reflux disease)   . Headache(784.0)    otc med prn - last one 02/2014  . History of blood transfusion 2000   In [place], [place] at [facility] - ? 4 units transfused  . Hypothyroidism   . Knee pain   . Plantar fasciitis   . Sleep apnea    uses CPAP  . SVD (spontaneous vaginal delivery)    x 3  . Thyroid disease    thyroidectomy 1995     Past Surgical History:  Procedure Laterality Date  . ABDOMINAL HYSTERECTOMY N/A 04/20/2014   Procedure: Total ABDOMINAL HYSTERECTOMY partial right salpingectomy;  Surgeon: Eldred Manges, MD;  Location: [facility];  Service: Gynecology;  Laterality: N/A;  . BREATH TEK H PYLORI  09/18/2011   Procedure: BREATH TEK H PYLORI;  Surgeon: Pedro Earls, MD;  Location: [facility];  Service: General;  Laterality: N/A;  . BUNIONECTOMY     left;2015  . BUNIONECTOMY    . COLONOSCOPY    . DILATION AND CURETTAGE OF UTERUS  1988   endometriosis  . ECTOPIC PREGNANCY SURGERY    . fallopian tubes removed     left tube removed per patient - laparotomy  . FOOT SURGERY Right 2019  . FOOT SURGERY Bilateral   . LAPAROSCOPIC GASTRIC SLEEVE RESECTION N/A 04/14/2018   Procedure: LAPAROSCOPIC GASTRIC SLEEVE RESECTION WITH UPPER ENDO AND ERAS PATHWAY;  Surgeon: Johnathan Hausen, MD;  Location: [facility];  Service: General;  Laterality: N/A;  . LYSIS OF ADHESION N/A 04/20/2014   Procedure:  LYSIS OF ADHESION;  Surgeon: Eldred Manges, MD;  Location: [facility];  Service: Gynecology;  Laterality: N/A;  . THYROIDECTOMY  1995  . TUBAL LIGATION    . WISDOM TOOTH EXTRACTION       Social History   Socioeconomic History  . Marital status: Married    Spouse name: Not on file  . Number of children: Not on file  . Years of education: Not on file  . Highest education level: Not on file  Occupational History  . Not on file  Social Needs  . Financial resource strain: Not on file  . Food insecurity:    Worry: Not on file     Inability: Not on file  . Transportation needs:    Medical: Not on file    Non-medical: Not on file  Tobacco Use  . Smoking status: Never Smoker  . Smokeless tobacco: Never Used  Substance and Sexual Activity  . Alcohol use: Yes    Alcohol/week: 0.0 standard drinks    Comment: socially; every 3-4 months  . Drug use: No  . Sexual activity: Yes    Birth control/protection: Surgical  Lifestyle  . Physical activity:    Days per week: Not on file    Minutes per session: Not on file  . Stress: Not on file  Relationships  . Social connections:    Talks on phone: Not on file    Gets together: Not on file    Attends religious service: Not on file    Active member of club or organization: Not on file    Attends meetings of clubs or organizations: Not on file    Relationship status: Not on file  . Intimate partner violence:    Fear of current or ex partner: Not on file    Emotionally abused: Not on file    Physically abused: Not on file    Forced sexual activity: Not on file  Other Topics Concern  . Not on file  Social History Narrative  . Not on file     Current Outpatient Medications on File Prior to Visit  Medication Sig Dispense Refill  . atorvastatin (LIPITOR) 10 MG tablet Take 10 mg by mouth daily.    Marland Kitchen levothyroxine (SYNTHROID) 112 MCG tablet Take 1 tablet (112 mcg total) by mouth daily. 30 tablet 1  . Multiple Vitamins-Minerals (BARIATRIC MULTIVITAMINS/IRON) CAPS Take by mouth.    . naproxen (NAPROSYN) 500 MG tablet Take 500 mg by mouth as needed.      No current facility-administered medications on file prior to visit.     Cardiovascular studies:  Event monitor 12/28/2018 - 01/19/2019: Sinus rhythm. Tachycardia up to 155 bpm, bradycardia up to 49 bpm.  No atrial fibrillation/atrial flutter/SVT/VT/high grade AV block noted.  Exercise Treadmill Stress Test 01/06/2019: Indication: Chest pain/pressure, near-syncope, palpitation The patient exercised on Bruce  protocol for  06:02 min. Patient achieved  7.10 METS and reached HR  150 bpm, which is  88 % of maximum age-predicted HR.  Stress test terminated due to fatigue.  Normal BP response.  Chest Pain: none. ST Changes: With peak exercise there was no ST-T changes of ischemia. Arrhythmias: none. HR Response to Exercise: Appropriate. Exercise capacity was  low for age. Recommendations: Continue primary/secondary prevention.  EKG 12/28/2018: Sinus rhythm with two PAC's. Normal axis. Normal conduction.  11/16/2018: Glucose 94. BUN/Cr 17/1.0. eGFR 61. Na/K 145/3.7 Chol 200, TG 35, HDL 60, LDL 133 HbA1C 5.7% TSH 0.8 normal   Review of  Systems  Constitution: Negative for decreased appetite, malaise/fatigue, weight gain and weight loss.  HENT: Negative for congestion.   Eyes: Negative for visual disturbance.  Cardiovascular: Positive for palpitations. Negative for chest pain, dyspnea on exertion, leg swelling and syncope.  Respiratory: Negative for shortness of breath.   Endocrine: Negative for cold intolerance.  Hematologic/Lymphatic: Does not bruise/bleed easily.  Skin: Negative for itching and rash.  Musculoskeletal: Negative for myalgias.  Gastrointestinal: Negative for abdominal pain, nausea and vomiting.  Genitourinary: Negative for dysuria.  Neurological: Negative for dizziness and weakness.  Psychiatric/Behavioral: The patient is not nervous/anxious.   All other systems reviewed and are negative.        There were no vitals filed for this visit.  Objective:   Physical Exam  Constitutional: She is oriented to person, place, and time. She appears well-developed and well-nourished. No distress.  HENT:  Head: Normocephalic and atraumatic.  Eyes: Pupils are equal, round, and reactive to light. Conjunctivae are normal.  Neck: No JVD present.  Cardiovascular: Normal rate, regular rhythm and intact distal pulses.  No murmur heard. Pulmonary/Chest: Effort normal and breath sounds  normal. She has no wheezes. She has no rales.  Abdominal: Soft. Bowel sounds are normal. There is no rebound.  Musculoskeletal:        General: No edema.  Lymphadenopathy:    She has no cervical adenopathy.  Neurological: She is alert and oriented to person, place, and time. No cranial nerve deficit.  Skin: Skin is warm and dry.  Psychiatric: She has a normal mood and affect.  Nursing note and vitals reviewed.         Assessment & Recommendations:   53 y/o [place] American female with hypertension, hyperlipidemia, type 2 DM, malignant neoplasm of thyroid glad, postprocedural hypothyroidism, referred for evaluation of palpitations.  Palpitations: PAC's seen on EKG. Occasional sinus tachycardia seen on event monitor. No other arrhthymias seen. Will obtain echocardiogram in July 2020 for completeness  Atypical chest pain: Reassuring exercise treadmill stress test  Follow up with me as needed.   Nigel Mormon, MD [facility] Pager: [phone] Office: [phone] If no answer Cell [phone]

## 2019-03-04 ENCOUNTER — Other Ambulatory Visit: Payer: Self-pay

## 2019-03-04 ENCOUNTER — Encounter: Payer: Self-pay | Admitting: Internal Medicine

## 2019-03-04 ENCOUNTER — Ambulatory Visit (INDEPENDENT_AMBULATORY_CARE_PROVIDER_SITE_OTHER): Payer: BC Managed Care – PPO | Admitting: Internal Medicine

## 2019-03-04 VITALS — BP 131/88 | HR 74 | Temp 97.5°F | Ht 64.0 in | Wt 198.0 lb

## 2019-03-04 DIAGNOSIS — Z6833 Body mass index (BMI) 33.0-33.9, adult: Secondary | ICD-10-CM

## 2019-03-04 DIAGNOSIS — E6609 Other obesity due to excess calories: Secondary | ICD-10-CM

## 2019-03-04 DIAGNOSIS — E039 Hypothyroidism, unspecified: Secondary | ICD-10-CM | POA: Diagnosis not present

## 2019-03-04 DIAGNOSIS — I10 Essential (primary) hypertension: Secondary | ICD-10-CM | POA: Diagnosis not present

## 2019-03-04 DIAGNOSIS — R635 Abnormal weight gain: Secondary | ICD-10-CM

## 2019-03-04 DIAGNOSIS — E7800 Pure hypercholesterolemia, unspecified: Secondary | ICD-10-CM

## 2019-03-04 NOTE — Patient Instructions (Signed)
Tension Headache, Adult  A tension headache is pain, pressure, or aching in your head. Tension headaches can last from 30 minutes to several days.  Follow these instructions at home:  Managing pain   Take over-the-counter and prescription medicines only as told by your doctor.   When you have a headache, lie down in a dark, quiet room.   If told, put ice on your head and neck:  ? Put ice in a plastic bag.  ? Place a towel between your skin and the bag.  ? Leave the ice on for 20 minutes, 2-3 times a day.   If told, put heat on the back of your neck. Do this as often as your doctor tells you to. Use the kind of heat that your doctor recommends, such as a moist heat pack or a heating pad.  ? Place a towel between your skin and the heat.  ? Leave the heat on for 20-30 minutes.  ? Remove the heat if your skin turns bright red.  Eating and drinking   Eat meals on a regular schedule.   Watch how much alcohol you drink:  ? If you are a woman and are not pregnant, do not drink more than 1 drink a day.  ? If you are a man, do not drink more than 2 drinks a day.   Drink enough fluid to keep your pee (urine) pale yellow.   Do not use a lot of caffeine, or stop using caffeine.  Lifestyle   Get enough sleep. Get 7-9 hours of sleep each night. Or get the amount of sleep that your doctor tells you to.   At bedtime, remove all electronic devices from your room. Examples of electronic devices are computers, phones, and tablets.   Find ways to lessen your stress. Some things that can lessen stress are:  ? Exercise.  ? Deep breathing.  ? Yoga.  ? Music.  ? Positive thoughts.   Sit up straight. Do not tighten (tense) your muscles.   Do not use any products that have nicotine or tobacco in them, such as cigarettes and e-cigarettes. If you need help quitting, ask your doctor.  General instructions     Keep all follow-up visits as told by your doctor. This is important.   Avoid things that can bring on headaches. Keep a  journal to find out if certain things bring on headaches. For example, write down:  ? What you eat and drink.  ? How much sleep you get.  ? Any change to your diet or medicines.  Contact a doctor if:   Your headache does not get better.   Your headache comes back.   You have a headache and sounds, light, or smells bother you.   You feel sick to your stomach (nauseous) or you throw up (vomit).   Your stomach hurts.  Get help right away if:   You suddenly get a very bad headache along with any of these:  ? A stiff neck.  ? Feeling sick to your stomach.  ? Throwing up.  ? Feeling weak.  ? Trouble seeing.  ? Feeling short of breath.  ? A rash.  ? Feeling unusually sleepy.  ? Trouble speaking.  ? Pain in your eye or ear.  ? Trouble walking or balancing.  ? Feeling like you will pass out (faint).  ? Passing out.  Summary   A tension headache is pain, pressure, or aching in your head.   Tension   headaches can last from 30 minutes to several days.   Lifestyle changes and medicines may help relieve pain.  This information is not intended to replace advice given to you by your health care provider. Make sure you discuss any questions you have with your health care provider.  Document Released: 01/08/2010 Document Revised: 01/24/2017 Document Reviewed: 01/24/2017  Elsevier Interactive Patient Education  2019 [employer]

## 2019-03-05 ENCOUNTER — Telehealth: Payer: Self-pay

## 2019-03-05 NOTE — Telephone Encounter (Signed)
Pt states that she is having ear pain. Believes her glands are swollen and could be causing the problem. Wants to know if there is anything that she can do. Explained to pt that since this is a new problem she may need another appt and that I was putting a task in for the provider and would respond via mychart

## 2019-03-07 NOTE — Progress Notes (Signed)
Virtual Visit via Video   This visit type was conducted due to national recommendations for restrictions regarding the COVID-19 Pandemic (e.g. social distancing) in an effort to limit this patient's exposure and mitigate transmission in our community.  Due to her co-morbid illnesses, this patient is at least at moderate risk for complications without adequate follow up.  This format is felt to be most appropriate for this patient at this time.  All issues noted in this document were discussed and addressed.  A limited physical exam was performed with this format.    This visit type was conducted due to national recommendations for restrictions regarding the COVID-19 Pandemic (e.g. social distancing) in an effort to limit this patient's exposure and mitigate transmission in our community.  Patients identity confirmed using two different identifiers.  This format is felt to be most appropriate for this patient at this time.  All issues noted in this document were discussed and addressed.  No physical exam was performed (except for noted visual exam findings with Video Visits).    Date:  03/07/2019   ID:  Dana Vasquez, DOB 02/06/1966, MRN [id number]  Patient Location:  Home  Provider location:   Office    Chief Complaint:  Thyroid check  History of Present Illness:    Dana Vasquez is a 53 y.o. female who presents via video conferencing for a telehealth visit today.    The patient does not have symptoms concerning for COVID-19 infection (fever, chills, cough, or new shortness of breath).   She presents today for virtual visit. She prefers this method of contact due to COVID-19 pandemic.  She reports that she has been working from home which has been stressful. She is here to have f/u for her thyroid, cholesterol and high blood pressure. She reports compliance with meds. She adds that Dr. Chalmers Cater has started her on a cholesterol medication, atorvastatin.   Hyperlipidemia  This is a  chronic problem. The problem is uncontrolled. Exacerbating diseases include hypothyroidism and obesity. Pertinent negatives include no chest pain, leg pain or shortness of breath. Current antihyperlipidemic treatment includes statins. Compliance problems include adherence to diet.  Risk factors for coronary artery disease include dyslipidemia, hypertension, obesity and post-menopausal.  Hypertension  This is a chronic problem. The current episode started more than 1 year ago. The problem has been gradually improving since onset. The problem is controlled. Pertinent negatives include no blurred vision, chest pain, palpitations or shortness of breath.     Past Medical History:  Diagnosis Date  . Allergy   . Anemia   . Arthritis    knees  AND SHOULDERS  . Asthma    allergy related - rarely uses inhaler  . Bruises easily   . Bunion   . Cancer (HCC)    1995;thyroid cancer, SURGERY AND RADIOACTIVE IODINE  . Ectopic pregnancy   . Euthyroid   . Fatigue   . Fibromyalgia   . GERD (gastroesophageal reflux disease)   . Headache(784.0)    otc med prn - last one 02/2014  . History of blood transfusion 2000   In [place], [place] at [place] - ? 4 units transfused  . Hypothyroidism   . Knee pain   . Plantar fasciitis   . Sleep apnea    uses CPAP  . SVD (spontaneous vaginal delivery)    x 3  . Thyroid disease    thyroidectomy 1995   Past Surgical History:  Procedure Laterality Date  . ABDOMINAL HYSTERECTOMY N/A 04/20/2014  Procedure: Total ABDOMINAL HYSTERECTOMY partial right salpingectomy;  Surgeon: Eldred Manges, MD;  Location: [facility];  Service: Gynecology;  Laterality: N/A;  . BREATH TEK H PYLORI  09/18/2011   Procedure: BREATH TEK H PYLORI;  Surgeon: Pedro Earls, MD;  Location: [facility];  Service: General;  Laterality: N/A;  . BUNIONECTOMY     left;2015  . BUNIONECTOMY    . COLONOSCOPY    . DILATION AND CURETTAGE OF UTERUS  1988   endometriosis  . ECTOPIC PREGNANCY SURGERY     . fallopian tubes removed     left tube removed per patient - laparotomy  . FOOT SURGERY Right 2019  . FOOT SURGERY Bilateral   . LAPAROSCOPIC GASTRIC SLEEVE RESECTION N/A 04/14/2018   Procedure: LAPAROSCOPIC GASTRIC SLEEVE RESECTION WITH UPPER ENDO AND ERAS PATHWAY;  Surgeon: Johnathan Hausen, MD;  Location: [facility];  Service: General;  Laterality: N/A;  . LYSIS OF ADHESION N/A 04/20/2014   Procedure: LYSIS OF ADHESION;  Surgeon: Eldred Manges, MD;  Location: [facility];  Service: Gynecology;  Laterality: N/A;  . THYROIDECTOMY  1995  . TUBAL LIGATION    . WISDOM TOOTH EXTRACTION       Current Meds  Medication Sig  . atorvastatin (LIPITOR) 10 MG tablet Take 10 mg by mouth daily.  Marland Kitchen levothyroxine (SYNTHROID) 112 MCG tablet Take 1 tablet (112 mcg total) by mouth daily.  . Multiple Vitamins-Minerals (BARIATRIC MULTIVITAMINS/IRON) CAPS Take by mouth.  . naproxen (NAPROSYN) 500 MG tablet Take 500 mg by mouth as needed.      Allergies:   Amoxicillin; Nitrofurantoin monohyd macro; Dexilant [dexlansoprazole]; Meloxicam; Budesonide; and Formoterol fumarate   Social History   Tobacco Use  . Smoking status: Never Smoker  . Smokeless tobacco: Never Used  Substance Use Topics  . Alcohol use: Yes    Alcohol/week: 0.0 standard drinks    Comment: occ  . Drug use: No     Family Hx: The patient's family history includes Allergic rhinitis in her daughter and daughter; Cancer in her brother, father, and mother; Early death in her brother; Food Allergy in her daughter; Heart disease in her maternal uncle; Hypertension in her mother; Stroke in her maternal uncle. There is no history of Angioedema, Asthma, Eczema, Immunodeficiency, or Urticaria.  ROS:   Please see the history of present illness.    Review of Systems  Constitutional: Negative.   Eyes: Negative for blurred vision.  Respiratory: Negative.  Negative for shortness of breath.   Cardiovascular: Negative.  Negative for chest pain and  palpitations.  Gastrointestinal: Negative.   Neurological: Negative.   Psychiatric/Behavioral: Negative.     All other systems reviewed and are negative.   Labs/Other Tests and Data Reviewed:    Recent Labs: 09/03/2018: ALT 9; BUN 14; Creatinine, Ser 0.98; Hemoglobin 11.4; Platelets 385; Potassium 4.2; Sodium 141; TSH 0.516   Recent Lipid Panel Lab Results  Component Value Date/Time   CHOL 238 (H) 09/03/2018 12:16 PM   TRIG 79 09/03/2018 12:16 PM   HDL 57 09/03/2018 12:16 PM   CHOLHDL 4.2 09/03/2018 12:16 PM   CHOLHDL 3.7 10/10/2011 09:14 AM   LDLCALC 165 (H) 09/03/2018 12:16 PM    Wt Readings from Last 3 Encounters:  03/04/19 198 lb (89.8 kg)  02/11/19 195 lb (88.5 kg)  12/31/18 200 lb (90.7 kg)     Exam:    Vital Signs:  BP 131/88 (BP Location: Left Arm, Patient Position: Sitting, Cuff Size: Normal) Comment: pt provided  Pulse 74 Comment: pt provided  Temp (!) 97.5 F (36.4 C) (Oral)   Ht 5\' 4"  (1.626 m)   Wt 198 lb (89.8 kg) Comment: pt provided  LMP 04/07/2014   BMI 33.99 kg/m     Physical Exam  Constitutional: She is oriented to person, place, and time and well-developed, well-nourished, and in no distress.  HENT:  Head: Normocephalic and atraumatic.  Neck: Normal range of motion.  Pulmonary/Chest: Effort normal.  Neurological: She is alert and oriented to person, place, and time.  Psychiatric: Affect normal.  Nursing note and vitals reviewed.   ASSESSMENT & PLAN:     1. Hypothyroidism, unspecified type  She agrees to come in next week for bloodwork.  I will check thyroid panel and adjust meds as needed.  I will be sure to also send a copy of her labs to Dr. Chalmers Cater for review.   - TSH; Future - T4, Free; Future  2. Pure hypercholesterolemia  I will check fasting lipid panel next Monday when she comes in for labwork. She will continue with atorvastatin for now. Again, I will forward her labs to Dr. Chalmers Cater for review.   - Lipid panel; Future  3.  Essential hypertension, benign  Fair control. Pt is aware optimal bp is less than 120/80. She is encouraged to exercise no less than five days weekly for at least 30 minutes.   4. Weight gain  She is cautioned to not gain the "Quarantine 15"! Again, she is encouraged to incorporate more exercise into her daily routine.   5. Class 1 obesity due to excess calories with serious comorbidity and body mass index (BMI) of 33.0 to 33.9 in adult  She is congratulated on her weight loss thus far! She has lost in excess of 75 pounds since gastric sleeve procedure performed June 2019. Her previous BMI was 46. She is encouraged to keep up the great work!     COVID-19 Education: The signs and symptoms of COVID-19 were discussed with the patient and how to seek care for testing (follow up with PCP or arrange E-visit).  The importance of social distancing was discussed today.  Patient Risk:   After full review of this patients clinical status, I feel that they are at least moderate risk at this time.  Time:   Today, I have spent 21 minutes/ 46 seconds with the patient with telehealth technology discussing above diagnoses.     Medication Adjustments/Labs and Tests Ordered: Current medicines are reviewed at length with the patient today.  Concerns regarding medicines are outlined above.   Tests Ordered: Orders Placed This Encounter  Procedures  . Lipid panel  . TSH  . T4, Free    Medication Changes: No orders of the defined types were placed in this encounter.   Disposition:  Follow up in 6 month(s)  Signed, [name], MD

## 2019-03-08 ENCOUNTER — Telehealth: Payer: Self-pay

## 2019-03-08 NOTE — Telephone Encounter (Signed)
Called pt no answer, left message  Pls call her Monday - is she still having ear pain? If yes, we can send her rx Norel AD to take twice daily as needed. #20/zero refills. OR she can see JM virtually since she has an appt Monday afternoon

## 2019-03-08 NOTE — Telephone Encounter (Signed)
-----   Message from Glendale Chard, MD sent at 03/06/2019 12:20 PM EDT ----- Pls call her Monday - is she still having ear pain? If yes, we can send her rx Norel AD to take twice daily as needed. #20/zero refills. OR she can see JM virtually since she has an appt Monday afternoon  ----- Message ----- From: Octavio Manns Sent: 03/05/2019  10:14 AM EDT To: Glendale Chard, MD  Pt states that she is having ear pain and forgot to talk you about it yesterday at appt. Believes her glands are swollen and could be causing the problem. Wants to know if there is anything that she can do. Explained to pt that since this is a new problem she may need another appt and that I was putting a task in for the provider and would respond via mychart or that I would call back. She says that mychart wont allow her to send a message

## 2019-03-09 ENCOUNTER — Other Ambulatory Visit: Payer: Self-pay

## 2019-03-09 ENCOUNTER — Other Ambulatory Visit: Payer: Self-pay | Admitting: Internal Medicine

## 2019-03-09 ENCOUNTER — Other Ambulatory Visit: Payer: BC Managed Care – PPO

## 2019-03-09 DIAGNOSIS — E039 Hypothyroidism, unspecified: Secondary | ICD-10-CM

## 2019-03-09 DIAGNOSIS — E7800 Pure hypercholesterolemia, unspecified: Secondary | ICD-10-CM

## 2019-03-10 LAB — LIPID PANEL
Chol/HDL Ratio: 2.5 ratio (ref 0.0–4.4)
Cholesterol, Total: 166 mg/dL (ref 100–199)
HDL: 67 mg/dL (ref >39)
LDL Calculated: 90 mg/dL (ref 0–99)
Triglycerides: 46 mg/dL (ref 0–149)
VLDL Cholesterol Cal: 9 mg/dL (ref 5–40)

## 2019-03-10 LAB — TSH: TSH: 0.791 u[IU]/mL (ref 0.450–4.500)

## 2019-03-10 LAB — T4, FREE: Free T4: 1.56 ng/dL (ref 0.82–1.77)

## 2019-03-18 ENCOUNTER — Encounter: Payer: Self-pay | Admitting: Internal Medicine

## 2019-04-06 ENCOUNTER — Other Ambulatory Visit: Payer: Self-pay | Admitting: Sports Medicine

## 2019-04-06 DIAGNOSIS — M25511 Pain in right shoulder: Secondary | ICD-10-CM

## 2019-04-06 DIAGNOSIS — S43431A Superior glenoid labrum lesion of right shoulder, initial encounter: Secondary | ICD-10-CM

## 2019-04-13 ENCOUNTER — Encounter: Payer: BC Managed Care – PPO | Attending: Surgery

## 2019-04-20 ENCOUNTER — Telehealth: Payer: Self-pay

## 2019-04-20 NOTE — Telephone Encounter (Signed)
Spoke with the patient and she wanted to r/s her appt due to family issues. She has been r/s for 08/11/2019 @ 8:00 with Ward Givens, NP. Patient is aware of new appt date and time.

## 2019-04-21 ENCOUNTER — Ambulatory Visit: Payer: BC Managed Care – PPO | Admitting: Adult Health

## 2019-04-27 ENCOUNTER — Telehealth: Payer: Self-pay

## 2019-04-27 NOTE — Telephone Encounter (Signed)
Returned call to pt. Left message. Pt wants to know when she had her last tdap and did she need another one before the birth of her grandchild. She had it 08/09/14 and doesn't need it again until 08/09/2024

## 2019-04-29 ENCOUNTER — Encounter: Payer: Self-pay | Admitting: Internal Medicine

## 2019-05-03 ENCOUNTER — Ambulatory Visit
Admission: RE | Admit: 2019-05-03 | Discharge: 2019-05-03 | Disposition: A | Discharge status: Home / Self Care | Payer: BC Managed Care – PPO | Source: Ambulatory Visit | Attending: Sports Medicine | Admitting: Sports Medicine

## 2019-05-03 ENCOUNTER — Other Ambulatory Visit: Payer: Self-pay

## 2019-05-03 DIAGNOSIS — M25511 Pain in right shoulder: Secondary | ICD-10-CM

## 2019-05-03 DIAGNOSIS — S43431A Superior glenoid labrum lesion of right shoulder, initial encounter: Secondary | ICD-10-CM

## 2019-05-03 MED ORDER — IOPAMIDOL (ISOVUE-M 200) INJECTION 41%
13.0000 mL | Freq: Once | INTRAMUSCULAR | Status: AC
  Administered 2019-05-03: 13 mL via INTRA_ARTICULAR

## 2019-05-05 ENCOUNTER — Ambulatory Visit: Payer: BC Managed Care – PPO | Admitting: Skilled Nursing Facility1

## 2019-06-16 ENCOUNTER — Encounter: Payer: BC Managed Care – PPO | Attending: Surgery | Admitting: Skilled Nursing Facility1

## 2019-06-16 ENCOUNTER — Other Ambulatory Visit: Payer: Self-pay

## 2019-06-16 DIAGNOSIS — Z713 Dietary counseling and surveillance: Secondary | ICD-10-CM | POA: Diagnosis not present

## 2019-06-16 DIAGNOSIS — M25519 Pain in unspecified shoulder: Secondary | ICD-10-CM | POA: Diagnosis not present

## 2019-06-16 DIAGNOSIS — Z6834 Body mass index (BMI) 34.0-34.9, adult: Secondary | ICD-10-CM | POA: Insufficient documentation

## 2019-06-16 DIAGNOSIS — Z9884 Bariatric surgery status: Secondary | ICD-10-CM | POA: Insufficient documentation

## 2019-06-16 DIAGNOSIS — M545 Low back pain: Secondary | ICD-10-CM | POA: Diagnosis not present

## 2019-06-16 DIAGNOSIS — E669 Obesity, unspecified: Secondary | ICD-10-CM

## 2019-06-16 NOTE — Patient Instructions (Addendum)
-  talk with your doctor about the nodules on your fingers and loss of strength in hands   -Pay attention to when you are boredum eating   -Do not sacrifice your meals for snacks  -Get back into drinking water like you did (aiming for 64 fluid ounces)  -You only need 3 (total) Tums 2 hours a part  -Try 4 ounces of kefir in the morning

## 2019-06-16 NOTE — Progress Notes (Signed)
Follow-up visit:  Post-Operative Sleeve Surgery  Medical Nutrition Therapy:  Appt start time: 6:00pm end time:  7:00pm  Primary concerns today: Post-operative Bariatric Surgery Nutrition Management 6 Month Post-Op Class   Pt state she has been lower back pain and shoulder pain recently. Pt states she was allergic to the adhesive from the heart monitor causing a burning of her skin. Pt state she still experienced dizziness sometimes stating she feels like it is inconstancy with the vitamins: the Dizyness only occurs 1 or 2 times a month. Pt states she aches a lot in her shoulder (torn rotator cuff) and joints (getting knots on her finger joints) which is keeping her from exercising like she wants to. Pt states the is a knot on the side of her foot. Pt state she is losing strength in her hands for the past few months not being able to open jars. Pt states she is open to working with PT to relieve leg pain through the hip. Pt admits to boredom eating. Pt states she has painful gas a lot.   Pt seems to be creeping back into pre-suregry habits which is starting to be aware of.   Surgery date: 04/14/2018 Surgery type: Sleeve Start weight at [facility]: 266.2 Weight today: 199.8  Body Composition Scale 06/16/2019  Total Body Fat % 40.2  Visceral Fat 12  Fat-Free Mass % 59.7   Total Body Water % 44.3   Muscle-Mass lbs 30.4  Body Fat Displacement          Torso  lbs 49.7         Left Leg  lbs 9.9         Right Leg  lbs 9.9         Left Arm  lbs 4.9         Right Arm   lbs 4.9    24 hr recall: First meal: protein shake or scrambled eggs and meat or cottage cheese and peaches in light syrup or cheerios  Snack: doritos  Lunch: salmon or chicken salad  Snack: protein shake Dinner: salmon spinach pad thai noodles  Or soup and popcorn   Beverages: deaf coffee with creamer, water, unsweet tea  Fluid intake: 50 oz  Medications: see list Supplementation: vitmain c powder, probiotic, opurity  multivitamin, 2 tums calcium   CBG monitoring:  Average CBG per patient: Last patient reported A1c: 5.4  Using straws: no Drinking while eating: no Having you been chewing well:no Chewing/swallowing difficulties: no Changes in vision: o Changes to mood/headaches: no Hair loss/Cahnges to skin/Changes to nails:  Any difficulty focusing or concentrating: no Sweating: no Dizziness/Lightheaded:  Palpitations: no  Carbonated beverages: no N/V/D/C/GAS: no; taking mira lax every day  Abdominal Pain: no  Recent physical activity:  BELT  Progress Towards Goal(s):  In progress. Goals: -talk with your doctor about the nodules on your fingers and loss of strength in hands  -Pay attention to when you are boredum eating  -Do not sacrifice your meals for snacks -Get back into drinking water like you did (aiming for 64 fluid ounces) -You only need 3 (total) Tums 2 hours a part -Try 4 ounces of kefir in the morning   Teaching Method Utilized: Visual Auditory Hands on   Demonstrated degree of understanding via:  Teach Back   Monitoring/Evaluation:  Dietary intake, exercise, and body weight. Follow up in 2 months

## 2019-06-17 ENCOUNTER — Telehealth: Payer: Self-pay | Admitting: Internal Medicine

## 2019-06-17 NOTE — Telephone Encounter (Signed)
PT LVM REQ APPT ATT TO CONTACT PT NO ANS UNABLE TO LVM PHONE JUST HANGS UP AFTER 4 RINGS

## 2019-06-24 ENCOUNTER — Ambulatory Visit (INDEPENDENT_AMBULATORY_CARE_PROVIDER_SITE_OTHER): Payer: BC Managed Care – PPO | Admitting: Nurse Practitioner

## 2019-06-24 ENCOUNTER — Other Ambulatory Visit: Payer: Self-pay

## 2019-06-24 ENCOUNTER — Encounter: Payer: Self-pay | Admitting: Nurse Practitioner

## 2019-06-24 VITALS — BP 116/60 | HR 74 | Temp 98.2°F | Ht 64.0 in | Wt 197.8 lb

## 2019-06-24 DIAGNOSIS — M25561 Pain in right knee: Secondary | ICD-10-CM | POA: Diagnosis not present

## 2019-06-24 DIAGNOSIS — M25541 Pain in joints of right hand: Secondary | ICD-10-CM | POA: Diagnosis not present

## 2019-06-24 DIAGNOSIS — M545 Low back pain: Secondary | ICD-10-CM | POA: Diagnosis not present

## 2019-06-24 DIAGNOSIS — G8929 Other chronic pain: Secondary | ICD-10-CM

## 2019-06-24 DIAGNOSIS — M25542 Pain in joints of left hand: Secondary | ICD-10-CM | POA: Diagnosis not present

## 2019-06-24 MED ORDER — KETOROLAC TROMETHAMINE 60 MG/2ML IM SOLN
60.0000 mg | Freq: Once | INTRAMUSCULAR | Status: AC
  Administered 2019-06-24: 60 mg via INTRAMUSCULAR

## 2019-06-24 NOTE — Progress Notes (Signed)
Subjective:     Patient ID: Dana Vasquez , female    DOB: 11-26-1965 , 53 y.o.   MRN: [id number]   Chief Complaint  Patient presents with  . Back Pain    patient states her back has been hurting for the past year. she has a dull ache at the bottom of her tailbone  . Hand Pain    patient states her fingers have been hurting her for the past 3-4 months and has been worsening she stated the joints in her fingers become swolllen and at times it can make it difficult for her to hold items  . Knee Pain    patient states she has athritis in her knees and she goes to an orthopaedic doctor for injections but she wants to know if there is something she could do everyday.    HPI  Back Pain This is a chronic problem. The current episode started more than 1 year ago. The problem occurs intermittently. The problem has been gradually worsening since onset. The pain is present in the lumbar spine. The quality of the pain is described as aching (dull ). The pain radiates to the right thigh. The pain is at a severity of 8/10 (worse at night). The pain is worse during the night. The symptoms are aggravated by sitting. Pertinent negatives include no abdominal pain, chest pain, fever or headaches. Treatments tried: naprosyn when the pain is really bad. The treatment provided no relief.  Hand Pain  The incident occurred more than 1 week ago (Occurring over a few months). The pain is present in the right fingers and left fingers (bilateral hands joints are swelling.  ). The quality of the pain is described as aching. The pain does not radiate. Pertinent negatives include no chest pain. Exacerbated by: difficulty with grip, typing aggravates.  Grandmother had osteoarthritis. She has tried nothing for the symptoms.  Knee Pain  The incident occurred more than 1 week ago. There was no injury mechanism. Pain location: she has injections to her right knee.  she is having trouble to her lower back and she feels like  she is giving preference to one or the other.   The quality of the pain is described as aching. Pertinent negatives include no inability to bear weight. She reports no foreign bodies present. Treatments tried: steroid injections.     Past Medical History:  Diagnosis Date  . Allergy   . Anemia   . Arthritis    knees  AND SHOULDERS  . Asthma    allergy related - rarely uses inhaler  . Bruises easily   . Bunion   . Cancer (HCC)    1995;thyroid cancer, SURGERY AND RADIOACTIVE IODINE  . Ectopic pregnancy   . Euthyroid   . Fatigue   . Fibromyalgia   . GERD (gastroesophageal reflux disease)   . Headache(784.0)    otc med prn - last one 02/2014  . History of blood transfusion 2000   In [place], [place] at [place] - ? 4 units transfused  . Hypothyroidism   . Knee pain   . Plantar fasciitis   . Sleep apnea    uses CPAP  . SVD (spontaneous vaginal delivery)    x 3  . Thyroid disease    thyroidectomy 1995     Family History  Problem Relation Age of Onset  . Hypertension Mother   . Cancer Mother        uterine  . Cancer Father   .  Cancer Brother        stomach; 34  . Early death Brother   . Stroke Maternal Uncle   . Heart disease Maternal Uncle   . Food Allergy Daughter   . Allergic rhinitis Daughter   . Allergic rhinitis Daughter   . Angioedema Neg Hx   . Asthma Neg Hx   . Eczema Neg Hx   . Immunodeficiency Neg Hx   . Urticaria Neg Hx      Current Outpatient Medications:  .  atorvastatin (LIPITOR) 10 MG tablet, Take 10 mg by mouth daily., Disp: , Rfl:  .  levothyroxine (SYNTHROID) 112 MCG tablet, Take 1 tablet (112 mcg total) by mouth daily., Disp: 30 tablet, Rfl: 1 .  Multiple Vitamins-Minerals (BARIATRIC MULTIVITAMINS/IRON) CAPS, Take by mouth., Disp: , Rfl:  .  naproxen (NAPROSYN) 500 MG tablet, Take 500 mg by mouth as needed. , Disp: , Rfl:    Allergies  Allergen Reactions  . Amoxicillin Other (See Comments)    Headache Has patient had a PCN reaction causing  immediate rash, facial/tongue/throat swelling, SOB or lightheadedness with hypotension: No Has patient had a PCN reaction causing severe rash involving mucus membranes or skin necrosis: No Has patient had a PCN reaction that required hospitalization: No Has patient had a PCN reaction occurring within the last 10 years: Unknown If all of the above answers are "NO", then may proceed with Cephalosporin use.   . Nitrofurantoin Monohyd Macro Other (See Comments)    Pt stated she gets a bad yeast infection  . Dexilant [Dexlansoprazole]     "bad stomachache"  . Meloxicam     Upset stomach and stomach pains  . Budesonide Rash  . Formoterol Fumarate Rash     Review of Systems  Constitutional: Negative for fever.  Respiratory: Negative.   Cardiovascular: Negative for chest pain, palpitations and leg swelling.  Gastrointestinal: Negative for abdominal pain.  Endocrine: Negative for polydipsia, polyphagia and polyuria.  Musculoskeletal: Positive for arthralgias and back pain. Negative for joint swelling.  Skin: Negative.   Neurological: Negative for dizziness and headaches.  Psychiatric/Behavioral: Negative.      Today's Vitals   06/24/19 1430  BP: 116/60  Pulse: 74  Temp: 98.2 F (36.8 C)  TempSrc: Oral  Weight: 197 lb 12.8 oz (89.7 kg)  Height: 5\' 4"  (1.626 m)  PainSc: 4    Body mass index is 33.95 kg/m.   Objective:  Physical Exam Constitutional:      Appearance: Normal appearance.  Cardiovascular:     Rate and Rhythm: Normal rate.     Pulses: Normal pulses.     Heart sounds: Normal heart sounds. No murmur.  Pulmonary:     Effort: Pulmonary effort is normal. No respiratory distress.     Breath sounds: Normal breath sounds.  Musculoskeletal: Normal range of motion.        General: Tenderness (right knee lateral and medial aspect. midline back tender to touch. ) present.  Skin:    General: Skin is warm and dry.     Capillary Refill: Capillary refill takes less than 2  seconds.  Neurological:     General: No focal deficit present.     Mental Status: She is alert and oriented to person, place, and time.  Psychiatric:        Mood and Affect: Mood normal.        Behavior: Behavior normal.        Thought Content: Thought content normal.  Judgment: Judgment normal.         Assessment And Plan:     1. Arthralgia of both hands  Will check autoimmune panel   Toradol 60 mg IM given  - ketorolac (TORADOL) injection 60 mg - Autoimmune Profile  2. Chronic pain of right knee  She is to follow up with her orthopedic for knee injection  Tenderness to medial and lateral aspect of knee - ketorolac (TORADOL) injection 60 mg  3. Chronic midline low back pain without sciatica  Tenderness to midback,   Inflammation vs osteoarthritis - ketorolac (TORADOL) injection 60 mg   Minette Brine, FNP    THE PATIENT IS ENCOURAGED TO PRACTICE SOCIAL DISTANCING DUE TO THE COVID-19 PANDEMIC.

## 2019-06-25 ENCOUNTER — Encounter: Payer: Self-pay | Admitting: Nurse Practitioner

## 2019-06-25 LAB — AUTOIMMUNE PROFILE
Anti Nuclear Antibody (ANA): NEGATIVE
Complement C3, Serum: 114 mg/dL (ref 82–167)
dsDNA Ab: <1 IU/mL (ref 0–9)

## 2019-06-29 ENCOUNTER — Other Ambulatory Visit: Payer: Self-pay | Admitting: Nurse Practitioner

## 2019-06-29 ENCOUNTER — Ambulatory Visit: Payer: BC Managed Care – PPO | Admitting: Podiatry

## 2019-06-29 DIAGNOSIS — G8929 Other chronic pain: Secondary | ICD-10-CM

## 2019-06-30 ENCOUNTER — Telehealth: Payer: Self-pay | Admitting: Nurse Practitioner

## 2019-06-30 NOTE — Telephone Encounter (Signed)
Called and left message for patient to discuss what inflammatory markers are, no answer left voicemail. Also advised I will be out of the office until next Wednesday however can call to the office with any questions and I will respond upon return.

## 2019-07-08 ENCOUNTER — Other Ambulatory Visit: Payer: Self-pay

## 2019-07-08 ENCOUNTER — Ambulatory Visit: Payer: BC Managed Care – PPO | Admitting: Podiatry

## 2019-07-08 ENCOUNTER — Emergency Department (HOSPITAL_COMMUNITY)
Admission: EM | Admit: 2019-07-08 | Discharge: 2019-07-08 | Disposition: A | Discharge status: Home / Self Care | Payer: BC Managed Care – PPO | Attending: Emergency Medicine | Admitting: Emergency Medicine

## 2019-07-08 ENCOUNTER — Encounter (HOSPITAL_COMMUNITY): Payer: Self-pay

## 2019-07-08 DIAGNOSIS — Y93E5 Activity, floor mopping and cleaning: Secondary | ICD-10-CM | POA: Diagnosis not present

## 2019-07-08 DIAGNOSIS — Y929 Unspecified place or not applicable: Secondary | ICD-10-CM | POA: Diagnosis not present

## 2019-07-08 DIAGNOSIS — Z23 Encounter for immunization: Secondary | ICD-10-CM | POA: Insufficient documentation

## 2019-07-08 DIAGNOSIS — I129 Hypertensive chronic kidney disease with stage 1 through stage 4 chronic kidney disease, or unspecified chronic kidney disease: Secondary | ICD-10-CM | POA: Diagnosis not present

## 2019-07-08 DIAGNOSIS — Y999 Unspecified external cause status: Secondary | ICD-10-CM | POA: Insufficient documentation

## 2019-07-08 DIAGNOSIS — N182 Chronic kidney disease, stage 2 (mild): Secondary | ICD-10-CM | POA: Insufficient documentation

## 2019-07-08 DIAGNOSIS — S81811A Laceration without foreign body, right lower leg, initial encounter: Secondary | ICD-10-CM | POA: Diagnosis not present

## 2019-07-08 DIAGNOSIS — W228XXA Striking against or struck by other objects, initial encounter: Secondary | ICD-10-CM | POA: Diagnosis not present

## 2019-07-08 DIAGNOSIS — E1122 Type 2 diabetes mellitus with diabetic chronic kidney disease: Secondary | ICD-10-CM | POA: Diagnosis not present

## 2019-07-08 DIAGNOSIS — Z79899 Other long term (current) drug therapy: Secondary | ICD-10-CM | POA: Insufficient documentation

## 2019-07-08 DIAGNOSIS — J4530 Mild persistent asthma, uncomplicated: Secondary | ICD-10-CM | POA: Insufficient documentation

## 2019-07-08 DIAGNOSIS — E039 Hypothyroidism, unspecified: Secondary | ICD-10-CM | POA: Insufficient documentation

## 2019-07-08 MED ORDER — TETANUS-DIPHTH-ACELL PERTUSSIS 5-2.5-18.5 LF-MCG/0.5 IM SUSP
0.5000 mL | Freq: Once | INTRAMUSCULAR | Status: AC
  Administered 2019-07-08: 0.5 mL via INTRAMUSCULAR
  Filled 2019-07-08: qty 0.5

## 2019-07-08 MED ORDER — LIDOCAINE-EPINEPHRINE (PF) 2 %-1:200000 IJ SOLN
10.0000 mL | Freq: Once | INTRAMUSCULAR | Status: AC
  Administered 2019-07-08: 10 mL
  Filled 2019-07-08: qty 10

## 2019-07-08 NOTE — ED Provider Notes (Signed)
[facility] DEPT Provider Note   CSN: [id number] Arrival date & time: 07/08/19  1710     History   Chief Complaint Chief Complaint  Patient presents with  . Laceration    HPI Dana Vasquez is a 53 y.o. female.     53 year old female with medical history as described below presents for evaluation of laceration to the anterior right lower shin.  Patient reports that she cut herself while cleaning her house.  She is unsure of her last tetanus.  She denies other injury.  The history is provided by the patient, the spouse and medical records.  Laceration Location: Right lower leg. Depth:  Cutaneous Bleeding: venous   Time since incident:  3 hours Pain details:    Quality:  Aching Relieved by:  Nothing Worsened by:  Nothing Ineffective treatments:  None tried   Past Medical History:  Diagnosis Date  . Allergy   . Anemia   . Arthritis    knees  AND SHOULDERS  . Asthma    allergy related - rarely uses inhaler  . Bruises easily   . Bunion   . Cancer (HCC)    1995;thyroid cancer, SURGERY AND RADIOACTIVE IODINE  . Ectopic pregnancy   . Euthyroid   . Fatigue   . Fibromyalgia   . GERD (gastroesophageal reflux disease)   . Headache(784.0)    otc med prn - last one 02/2014  . History of blood transfusion 2000   In [place], [place] at [place] - ? 4 units transfused  . Hypothyroidism   . Knee pain   . Plantar fasciitis   . Sleep apnea    uses CPAP  . SVD (spontaneous vaginal delivery)    x 3  . Thyroid disease    thyroidectomy 1995    Patient Active Problem List   Diagnosis Date Noted  . Palpitations 12/28/2018  . PAC (premature atrial contraction) 12/28/2018  . Hypothyroidism 04/27/2018  . Dehydration 04/22/2018  . 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 Euthyroid thyroiditis 03/03/2018  . Arthritis 03/03/2018  . Diverticulosis of colon without diverticulitis 03/03/2018  . Gastroesophageal reflux disease  03/03/2018  . Morbid obesity with body mass index (BMI) of 45.0 to 49.9 in adult ([place]) 12/03/2017  . Moderate persistent asthma without complication 99991111  . Encounter for general adult medical examination without abnormal findings 12/12/2016  . 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 Glomerular disorders in diseases classified elsewhere 12/12/2016  . Other long term (current) drug therapy 12/12/2016  . Allergic rhinitis 06/07/2016  . Asthma with acute exacerbation 06/07/2016  . Chronic kidney disease, stage II (mild) 04/28/2016  . Acute recurrent pansinusitis 04/03/2016  . Cough, persistent 04/03/2016  . Hoarseness 04/03/2016  . Sickle cell trait ([place]) 03/28/2016  . Obesity hypoventilation syndrome ([place]) 02/29/2016  . Obstructive sleep apnea 02/29/2016  . Diabetes mellitus due to underlying condition with complication, without long-term current use of insulin ([place]) 02/29/2016  . Fibroid, uterine 04/20/2014  . Fibroids 04/19/2014  . Type 2 diabetes mellitus with diabetic chronic kidney disease ([place]) 04/19/2014  . Obesity, Class III, BMI 40-49.9 (morbid obesity) ([place]) 02/12/2012  . Menorrhagia 02/12/2012  . Hx of ectopic pregnancy 02/12/2012  . Plantar fasciitis 05/29/2011    Past Surgical History:  Procedure Laterality Date  . ABDOMINAL HYSTERECTOMY N/A 04/20/2014   Procedure: Total ABDOMINAL HYSTERECTOMY partial right salpingectomy;  Surgeon: Eldred Manges,  MD;  Location: [facility];  Service: Gynecology;  Laterality: N/A;  . BREATH TEK H PYLORI  09/18/2011   Procedure: BREATH TEK H PYLORI;  Surgeon: Pedro Earls, MD;  Location: [facility];  Service: General;  Laterality: N/A;  . BUNIONECTOMY     left;2015  . BUNIONECTOMY    . COLONOSCOPY    . DILATION AND CURETTAGE OF UTERUS  1988   endometriosis  . ECTOPIC PREGNANCY SURGERY    . fallopian tubes removed     left  tube removed per patient - laparotomy  . FOOT SURGERY Right 2019  . FOOT SURGERY Bilateral   . LAPAROSCOPIC GASTRIC SLEEVE RESECTION N/A 04/14/2018   Procedure: LAPAROSCOPIC GASTRIC SLEEVE RESECTION WITH UPPER ENDO AND ERAS PATHWAY;  Surgeon: Johnathan Hausen, MD;  Location: [facility];  Service: General;  Laterality: N/A;  . LYSIS OF ADHESION N/A 04/20/2014   Procedure: LYSIS OF ADHESION;  Surgeon: Eldred Manges, MD;  Location: [facility];  Service: Gynecology;  Laterality: N/A;  . THYROIDECTOMY  1995  . TUBAL LIGATION    . WISDOM TOOTH EXTRACTION       OB History   No obstetric history on file.      Home Medications    Prior to Admission medications   Medication Sig Start Date End Date Taking? Authorizing Provider  atorvastatin (LIPITOR) 10 MG tablet Take 10 mg by mouth daily. 10/07/18  Yes [provider]  levothyroxine (SYNTHROID) 112 MCG tablet Take 1 tablet (112 mcg total) by mouth daily. 10/02/18 10/02/19 Yes Glendale Chard, MD  Multiple Vitamins-Minerals (BARIATRIC MULTIVITAMINS/IRON) CAPS Take 1 capsule by mouth daily.    Yes [provider]    Family History Family History  Problem Relation Age of Onset  . Hypertension Mother   . Cancer Mother        uterine  . Cancer Father   . Cancer Brother        stomach; 29  . Early death Brother   . Stroke Maternal Uncle   . Heart disease Maternal Uncle   . Food Allergy Daughter   . Allergic rhinitis Daughter   . Allergic rhinitis Daughter   . Angioedema Neg Hx   . Asthma Neg Hx   . Eczema Neg Hx   . Immunodeficiency Neg Hx   . Urticaria Neg Hx     Social History Social History   Tobacco Use  . Smoking status: Never Smoker  . Smokeless tobacco: Never Used  Substance Use Topics  . Alcohol use: Yes    Alcohol/week: 0.0 standard drinks    Comment: occ  . Drug use: No     Allergies   Amoxicillin, Nitrofurantoin monohyd macro, Dexilant [dexlansoprazole], Meloxicam, Budesonide, and Formoterol fumarate    Review of Systems Review of Systems  All other systems reviewed and are negative.    Physical Exam Updated Vital Signs BP (!) 143/80   Pulse 85   Temp 98.7 F (37.1 C) (Oral)   Resp 16   Ht 5\' 4"  (1.626 m)   Wt 89.8 kg   LMP 04/07/2014   SpO2 100%   BMI 33.99 kg/m   Physical Exam Vitals signs and nursing note reviewed.  Constitutional:      General: She is not in acute distress.    Appearance: Normal appearance. She is well-developed.  HENT:     Head: Normocephalic and atraumatic.  Eyes:     Conjunctiva/sclera: Conjunctivae normal.     Pupils: Pupils are equal, round, and  reactive to light.  Neck:     Musculoskeletal: Normal range of motion and neck supple.  Cardiovascular:     Rate and Rhythm: Normal rate and regular rhythm.     Heart sounds: Normal heart sounds.  Pulmonary:     Effort: Pulmonary effort is normal. No respiratory distress.     Breath sounds: Normal breath sounds.  Abdominal:     General: There is no distension.     Palpations: Abdomen is soft.     Tenderness: There is no abdominal tenderness.  Musculoskeletal: Normal range of motion.        General: No deformity.  Skin:    General: Skin is warm and dry.  Neurological:     General: No focal deficit present.     Mental Status: She is alert and oriented to person, place, and time. Mental status is at baseline.      ED Treatments / Results  Labs (all labs ordered are listed, but only abnormal results are displayed) Labs Reviewed - No data to display  EKG None  Radiology No results found.  Procedures .Marland KitchenLaceration Repair  Date/Time: 07/08/2019 10:15 PM Performed by: Valarie Merino, MD Authorized by: Valarie Merino, MD   Consent:    Consent obtained:  Verbal   Consent given by:  Patient   Risks discussed:  Infection, pain, need for additional repair, poor cosmetic result, poor wound healing, retained foreign body and nerve damage   Alternatives discussed:  No treatment  Anesthesia (see MAR for exact dosages):    Anesthesia method:  None Laceration details:    Location: Right anterior lower leg.   Length (cm):  6 Repair type:    Repair type:  Simple Pre-procedure details:    Preparation:  Patient was prepped and draped in usual sterile fashion Exploration:    Hemostasis achieved with:  Direct pressure   Wound exploration: wound explored through full range of motion and entire depth of wound probed and visualized     Contaminated: no   Treatment:    Area cleansed with:  Betadine and saline   Amount of cleaning:  Extensive   Irrigation solution:  Sterile saline   Irrigation method:  Syringe   Visualized foreign bodies/material removed: no   Skin repair:    Repair method:  Sutures   Suture size:  4-0   Suture material:  Nylon   Suture technique:  Running Approximation:    Approximation:  Close Post-procedure details:    Dressing:  Open (no dressing)   Patient tolerance of procedure:  Tolerated well, no immediate complications   (including critical care time)  Medications Ordered in ED Medications  Tdap (BOOSTRIX) injection 0.5 mL (0.5 mLs Intramuscular Given 07/08/19 1934)  lidocaine-EPINEPHrine (XYLOCAINE W/EPI) 2 %-1:200000 (PF) injection 10 mL (10 mLs Infiltration Given by Other 07/08/19 1933)     Initial Impression / Assessment and Plan / ED Course  I have reviewed the triage vital signs and the nursing notes.  Pertinent labs & imaging results that were available during my care of the patient were reviewed by me and considered in my medical decision making (see chart for details).     MDM  Screen complete  Dana Vasquez was evaluated in Emergency Department on 07/08/2019 for the symptoms described in the history of present illness. She was evaluated in the context of the global COVID-19 pandemic, which necessitated consideration that the patient might be at risk for infection with the SARS-CoV-2 virus that causes COVID-19.  Institutional protocols and algorithms that pertain to the evaluation of patients at risk for COVID-19 are in a state of rapid change based on information released by regulatory bodies including the CDC and federal and state organizations. These policies and algorithms were followed during the patient's care in the ED.  Patient is presenting for evaluation of repair of the laceration to the right anterior lower leg.  No other injury was sustained.  Laceration was pared without difficulty.  Patient tolerated this well.  Importance of close follow-up is stressed.  Strict return precautions given and understood.  Final Clinical Impressions(s) / ED Diagnoses   Final diagnoses:  Laceration of right lower extremity, initial encounter    ED Discharge Orders    None       Valarie Merino, MD 07/08/19 2216

## 2019-07-08 NOTE — ED Triage Notes (Signed)
Per EMS-states while she was sweeping the floor she hit her lower right leg on a putty knife-laceration above ankle-bleeding controlled at this time

## 2019-07-08 NOTE — Discharge Instructions (Addendum)
Please return for any problem.  Follow-up with your regular care provider as instructed.  Sutures need to be removed and 7 to 10 days.

## 2019-07-09 ENCOUNTER — Encounter: Payer: Self-pay | Admitting: Internal Medicine

## 2019-07-14 ENCOUNTER — Other Ambulatory Visit: Payer: Self-pay

## 2019-07-15 ENCOUNTER — Ambulatory Visit: Payer: BC Managed Care – PPO | Admitting: Nurse Practitioner

## 2019-07-15 VITALS — BP 118/78 | HR 66 | Temp 98.4°F

## 2019-07-15 DIAGNOSIS — S81811D Laceration without foreign body, right lower leg, subsequent encounter: Secondary | ICD-10-CM | POA: Diagnosis not present

## 2019-07-15 DIAGNOSIS — W260XXD Contact with knife, subsequent encounter: Secondary | ICD-10-CM | POA: Diagnosis not present

## 2019-07-15 DIAGNOSIS — L03115 Cellulitis of right lower limb: Secondary | ICD-10-CM

## 2019-07-15 MED ORDER — DOXYCYCLINE MONOHYDRATE 100 MG PO CAPS: 100.0000 mg | ORAL_CAPSULE | Freq: Two times a day (BID) | ORAL | 0 refills | Status: DC

## 2019-07-15 NOTE — Progress Notes (Signed)
Subjective:     Patient ID: Dana Vasquez , female    DOB: 1966-01-18 , 53 y.o.   MRN: [id number]   Chief Complaint  Patient presents with  . Suture / Staple Removal    HPI  Laceration from a putty knife one week ago today.  Continues to have pain. She did not have an antibiotic.   Suture / Staple Removal The sutures were placed 7 to 10 days ago. She tried a wound recheck since the wound repair. The treatment provided no relief. There has been no drainage from the wound. There is new redness present. There is no swelling present. The pain has worsened. Difficulty Moving Extremity/Digit: unable to bear weight on her right foot.     Past Medical History:  Diagnosis Date  . Allergy   . Anemia   . Arthritis    knees  AND SHOULDERS  . Asthma    allergy related - rarely uses inhaler  . Bruises easily   . Bunion   . Cancer (HCC)    1995;thyroid cancer, SURGERY AND RADIOACTIVE IODINE  . Ectopic pregnancy   . Euthyroid   . Fatigue   . Fibromyalgia   . GERD (gastroesophageal reflux disease)   . Headache(784.0)    otc med prn - last one 02/2014  . History of blood transfusion 2000   In [place], [place] at [place] - ? 4 units transfused  . Hypothyroidism   . Knee pain   . Plantar fasciitis   . Sleep apnea    uses CPAP  . SVD (spontaneous vaginal delivery)    x 3  . Thyroid disease    thyroidectomy 1995     Family History  Problem Relation Age of Onset  . Hypertension Mother   . Cancer Mother        uterine  . Cancer Father   . Cancer Brother        stomach; 74  . Early death Brother   . Stroke Maternal Uncle   . Heart disease Maternal Uncle   . Food Allergy Daughter   . Allergic rhinitis Daughter   . Allergic rhinitis Daughter   . Angioedema Neg Hx   . Asthma Neg Hx   . Eczema Neg Hx   . Immunodeficiency Neg Hx   . Urticaria Neg Hx      Current Outpatient Medications:  .  levothyroxine (SYNTHROID) 112 MCG tablet, Take 1 tablet (112 mcg total) by mouth  daily., Disp: 30 tablet, Rfl: 1 .  Multiple Vitamins-Minerals (BARIATRIC MULTIVITAMINS/IRON) CAPS, Take 1 capsule by mouth daily. , Disp: , Rfl:  .  atorvastatin (LIPITOR) 10 MG tablet, Take 10 mg by mouth daily., Disp: , Rfl:    Allergies  Allergen Reactions  . Amoxicillin Other (See Comments)    Headache Has patient had a PCN reaction causing immediate rash, facial/tongue/throat swelling, SOB or lightheadedness with hypotension: No Has patient had a PCN reaction causing severe rash involving mucus membranes or skin necrosis: No Has patient had a PCN reaction that required hospitalization: No Has patient had a PCN reaction occurring within the last 10 years: Unknown If all of the above answers are "NO", then may proceed with Cephalosporin use.   . Nitrofurantoin Monohyd Macro Other (See Comments)    Pt stated she gets a bad yeast infection  . Dexilant [Dexlansoprazole]     "bad stomachache"  . Meloxicam     Upset stomach and stomach pains  . Budesonide Rash  . Formoterol  Fumarate Rash     Review of Systems  Constitutional: Negative.   Respiratory: Negative.   Cardiovascular: Negative.   Skin:       Sutures to right lower extremity at ankle area, painful  Neurological: Negative for dizziness and headaches.     Today's Vitals   07/15/19 1634  BP: 118/78  Pulse: 66  Temp: 98.4 F (36.9 C)  TempSrc: Oral   There is no height or weight on file to calculate BMI.   Objective:  Physical Exam Vitals signs reviewed.  Constitutional:      Appearance: Normal appearance.  Cardiovascular:     Rate and Rhythm: Normal rate and regular rhythm.     Pulses: Normal pulses.     Heart sounds: Normal heart sounds. No murmur.  Pulmonary:     Effort: Pulmonary effort is normal.     Breath sounds: Normal breath sounds.  Skin:    Capillary Refill: Capillary refill takes less than 2 seconds.     Findings: Erythema (right ankle above sutures) present.     Comments: Right lower extremity  at base of ankle anteriorly erythema and painful to touch. Also hot to touch  Neurological:     General: No focal deficit present.     Mental Status: She is alert and oriented to person, place, and time.         Assessment And Plan:     1. Laceration of skin of right lower leg, subsequent encounter  Sutures are not ready to be removed area is hot to touch and painful  She will come back on Monday to have removed  Advised to not apply neosporin at this time  2. Cellulitis of right lower extremity  Will treat with doxycycline she has an allergy to PCN   Minette Brine, FNP    THE PATIENT IS ENCOURAGED TO PRACTICE SOCIAL DISTANCING DUE TO THE COVID-19 PANDEMIC.

## 2019-07-18 ENCOUNTER — Encounter: Payer: Self-pay | Admitting: Nurse Practitioner

## 2019-07-19 ENCOUNTER — Other Ambulatory Visit: Payer: Self-pay

## 2019-07-19 ENCOUNTER — Ambulatory Visit: Payer: BC Managed Care – PPO | Admitting: Nurse Practitioner

## 2019-07-19 ENCOUNTER — Encounter: Payer: Self-pay | Admitting: Nurse Practitioner

## 2019-07-19 VITALS — BP 140/78 | HR 85 | Temp 98.6°F | Ht 64.0 in | Wt 201.0 lb

## 2019-07-19 DIAGNOSIS — L03115 Cellulitis of right lower limb: Secondary | ICD-10-CM | POA: Diagnosis not present

## 2019-07-19 DIAGNOSIS — S81811S Laceration without foreign body, right lower leg, sequela: Secondary | ICD-10-CM

## 2019-07-19 MED ORDER — CEFTRIAXONE SODIUM 1 G IJ SOLR
1.0000 g | Freq: Once | INTRAMUSCULAR | Status: AC
  Administered 2019-07-19: 1 g via INTRAMUSCULAR

## 2019-07-19 NOTE — Progress Notes (Signed)
Subjective:     Patient ID: Dana Vasquez , female    DOB: 21-Oct-1966 , 53 y.o.   MRN: [id number]   Chief Complaint  Patient presents with  . Suture / Staple Removal    HPI  Recheck right lower leg laceration. She reports the pain is better.  The area had darkened and had purulent discharge and lighter area across top of the laceration  Suture / Staple Removal The sutures were placed 7 to 10 days ago. She tried a wound recheck since the wound repair. The treatment provided mild relief. The redness has improved. The swelling has improved. The pain has improved. Difficulty Moving Extremity/Digit: continues to use crutches for ambulation.     Past Medical History:  Diagnosis Date  . Allergy   . Anemia   . Arthritis    knees  AND SHOULDERS  . Asthma    allergy related - rarely uses inhaler  . Bruises easily   . Bunion   . Cancer (HCC)    1995;thyroid cancer, SURGERY AND RADIOACTIVE IODINE  . Ectopic pregnancy   . Euthyroid   . Fatigue   . Fibromyalgia   . GERD (gastroesophageal reflux disease)   . Headache(784.0)    otc med prn - last one 02/2014  . History of blood transfusion 2000   In [place], [place] at [place] - ? 4 units transfused  . Hypothyroidism   . Knee pain   . Plantar fasciitis   . Sleep apnea    uses CPAP  . SVD (spontaneous vaginal delivery)    x 3  . Thyroid disease    thyroidectomy 1995     Family History  Problem Relation Age of Onset  . Hypertension Mother   . Cancer Mother        uterine  . Cancer Father   . Cancer Brother        stomach; 68  . Early death Brother   . Stroke Maternal Uncle   . Heart disease Maternal Uncle   . Food Allergy Daughter   . Allergic rhinitis Daughter   . Allergic rhinitis Daughter   . Angioedema Neg Hx   . Asthma Neg Hx   . Eczema Neg Hx   . Immunodeficiency Neg Hx   . Urticaria Neg Hx      Current Outpatient Medications:  .  atorvastatin (LIPITOR) 10 MG tablet, Take 10 mg by mouth daily., Disp: ,  Rfl:  .  doxycycline (MONODOX) 100 MG capsule, Take 1 capsule (100 mg total) by mouth 2 (two) times daily., Disp: 20 capsule, Rfl: 0 .  levothyroxine (SYNTHROID) 112 MCG tablet, Take 1 tablet (112 mcg total) by mouth daily., Disp: 30 tablet, Rfl: 1 .  Multiple Vitamins-Minerals (BARIATRIC MULTIVITAMINS/IRON) CAPS, Take 1 capsule by mouth daily. , Disp: , Rfl:    Allergies  Allergen Reactions  . Amoxicillin Other (See Comments)    Headache Has patient had a PCN reaction causing immediate rash, facial/tongue/throat swelling, SOB or lightheadedness with hypotension: No Has patient had a PCN reaction causing severe rash involving mucus membranes or skin necrosis: No Has patient had a PCN reaction that required hospitalization: No Has patient had a PCN reaction occurring within the last 10 years: Unknown If all of the above answers are "NO", then may proceed with Cephalosporin use.   . Nitrofurantoin Monohyd Macro Other (See Comments)    Pt stated she gets a bad yeast infection  . Dexilant [Dexlansoprazole]     "bad stomachache"  .  Meloxicam     Upset stomach and stomach pains  . Budesonide Rash  . Formoterol Fumarate Rash     Review of Systems  Constitutional: Negative.   Respiratory: Negative.   Cardiovascular: Negative for chest pain, palpitations and leg swelling.  Skin: Positive for wound (has some drainage from the area, soreness is better and redness is better, tender to touch but improved).  Neurological: Negative for dizziness and headaches.     Today's Vitals   07/19/19 1035  BP: 140/78  Pulse: 85  Temp: 98.6 F (37 C)  TempSrc: Oral  SpO2: 98%  Weight: 201 lb (91.2 kg)  Height: 5\' 4"  (1.626 m)   Body mass index is 34.5 kg/m.   Objective:  Physical Exam Constitutional:      General: She is not in acute distress.    Appearance: Normal appearance.  Skin:    General: Skin is warm.     Capillary Refill: Capillary refill takes less than 2 seconds.     Comments:  Right lower extremity laceration not as hot as previous visit. The skin around the laceration is darkened not erythematous as it was before. Tender to touch with the right side of laceration with small amount of clear drainage. Swelling noted to ankle area.  Neurological:     General: No focal deficit present.     Mental Status: She is alert and oriented to person, place, and time.         Assessment And Plan:     1. Cellulitis of right lower extremity  Continues to have pain and swelling to the area.  Now has drainage from the laceration  Still unable to remove sutures will treat with rocephin today and she is to call the office tomorrow to inform of how she is doing may need additional oral antibiotics and will need to come in for suture removal  Wound culture sent of drainage from right side of laceration - cefTRIAXone (ROCEPHIN) injection 1 g - Culture, Wound         Minette Brine, FNP    THE PATIENT IS ENCOURAGED TO PRACTICE SOCIAL DISTANCING DUE TO THE COVID-19 PANDEMIC.

## 2019-07-22 ENCOUNTER — Encounter: Payer: Self-pay | Admitting: Nurse Practitioner

## 2019-07-22 ENCOUNTER — Other Ambulatory Visit: Payer: Self-pay

## 2019-07-22 ENCOUNTER — Ambulatory Visit: Payer: BC Managed Care – PPO | Admitting: Nurse Practitioner

## 2019-07-22 VITALS — BP 124/82 | HR 73 | Temp 98.1°F | Ht 64.0 in | Wt 201.0 lb

## 2019-07-22 DIAGNOSIS — Z4802 Encounter for removal of sutures: Secondary | ICD-10-CM

## 2019-07-22 DIAGNOSIS — L03115 Cellulitis of right lower limb: Secondary | ICD-10-CM | POA: Diagnosis not present

## 2019-07-22 LAB — WOUND CULTURE: Organism ID, Bacteria: NONE SEEN

## 2019-07-22 MED ORDER — CEPHALEXIN 500 MG PO CAPS: 500.0000 mg | ORAL_CAPSULE | Freq: Four times a day (QID) | ORAL | 0 refills | Status: AC

## 2019-07-22 NOTE — Progress Notes (Addendum)
Subjective:     Patient ID: Dana Vasquez , female    DOB: 04-27-1966 , 53 y.o.   MRN: [id number]   Chief Complaint  Patient presents with  . laceration f/u    patient presents today to have her stitches removed.    HPI  Here for suture removal or her right ankle laceration    Past Medical History:  Diagnosis Date  . Allergy   . Anemia   . Arthritis    knees  AND SHOULDERS  . Asthma    allergy related - rarely uses inhaler  . Bruises easily   . Bunion   . Cancer (HCC)    1995;thyroid cancer, SURGERY AND RADIOACTIVE IODINE  . Ectopic pregnancy   . Euthyroid   . Fatigue   . Fibromyalgia   . GERD (gastroesophageal reflux disease)   . Headache(784.0)    otc med prn - last one 02/2014  . History of blood transfusion 2000   In [place], [place] at [place] - ? 4 units transfused  . Hypothyroidism   . Knee pain   . Plantar fasciitis   . Sleep apnea    uses CPAP  . SVD (spontaneous vaginal delivery)    x 3  . Thyroid disease    thyroidectomy 1995     Family History  Problem Relation Age of Onset  . Hypertension Mother   . Cancer Mother        uterine  . Cancer Father   . Cancer Brother        stomach; 38  . Early death Brother   . Stroke Maternal Uncle   . Heart disease Maternal Uncle   . Food Allergy Daughter   . Allergic rhinitis Daughter   . Allergic rhinitis Daughter   . Angioedema Neg Hx   . Asthma Neg Hx   . Eczema Neg Hx   . Immunodeficiency Neg Hx   . Urticaria Neg Hx      Current Outpatient Medications:  .  atorvastatin (LIPITOR) 10 MG tablet, Take 10 mg by mouth daily., Disp: , Rfl:  .  doxycycline (MONODOX) 100 MG capsule, Take 1 capsule (100 mg total) by mouth 2 (two) times daily., Disp: 20 capsule, Rfl: 0 .  levothyroxine (SYNTHROID) 112 MCG tablet, Take 1 tablet (112 mcg total) by mouth daily., Disp: 30 tablet, Rfl: 1 .  Multiple Vitamins-Minerals (BARIATRIC MULTIVITAMINS/IRON) CAPS, Take 1 capsule by mouth daily. , Disp: , Rfl:     Allergies  Allergen Reactions  . Amoxicillin Other (See Comments)    Headache Has patient had a PCN reaction causing immediate rash, facial/tongue/throat swelling, SOB or lightheadedness with hypotension: No Has patient had a PCN reaction causing severe rash involving mucus membranes or skin necrosis: No Has patient had a PCN reaction that required hospitalization: No Has patient had a PCN reaction occurring within the last 10 years: Unknown If all of the above answers are "NO", then may proceed with Cephalosporin use.   . Nitrofurantoin Monohyd Macro Other (See Comments)    Pt stated she gets a bad yeast infection  . Dexilant [Dexlansoprazole]     "bad stomachache"  . Meloxicam     Upset stomach and stomach pains  . Budesonide Rash  . Formoterol Fumarate Rash     Review of Systems  Constitutional: Negative.   Respiratory: Negative.   Cardiovascular: Negative.   Musculoskeletal:       Right anterior ankle pain and slight swelling with sutures intact  Psychiatric/Behavioral:  Negative.      Today's Vitals   07/22/19 1640  BP: 124/82  Pulse: 73  Temp: 98.1 F (36.7 C)  TempSrc: Oral  Weight: 201 lb (91.2 kg)  Height: 5\' 4"  (1.626 m)  PainSc: 4    Body mass index is 34.5 kg/m.   Objective:  Physical Exam Constitutional:      Appearance: Normal appearance.  Cardiovascular:     Rate and Rhythm: Normal rate and regular rhythm.     Pulses: Normal pulses.     Heart sounds: Normal heart sounds. No murmur.  Pulmonary:     Effort: Pulmonary effort is normal.     Breath sounds: Normal breath sounds.  Musculoskeletal:        General: Tenderness (right anterior ankle) present.  Skin:    Comments: Sutures intact to right anterior ankle with excoriation present and still slightly swollen  Neurological:     Mental Status: She is alert.         Assessment And Plan:     1. Cellulitis of right lower extremity  Continues to have tenderness and swelling to area  Her  culture was positive for staph and was resistant to cephalexin so I will change antibiotic - cephALEXin (KEFLEX) 500 MG capsule; Take 1 capsule (500 mg total) by mouth 4 (four) times daily for 10 days.  Dispense: 40 capsule; Refill: 0  2. Visit for suture removal  Extensive suture removal due to swelling and severe exocriation  Looped sutures removed  Had to soak area with normal saline to facilitate suture removal  Spent 1 hour total time with patient  Also used 1% lidocaine to area to numb due to increasing pain and the sutures were embedded  More than 50% of time spent face to face with patient with a total time of 60 minutes  Minette Brine, FNP    THE PATIENT IS ENCOURAGED TO PRACTICE SOCIAL DISTANCING DUE TO THE COVID-19 PANDEMIC.

## 2019-07-26 ENCOUNTER — Encounter: Payer: Self-pay | Admitting: Nurse Practitioner

## 2019-07-26 NOTE — Telephone Encounter (Signed)
Can you write her a work note to return on October 5th. Thanks

## 2019-07-27 ENCOUNTER — Encounter: Payer: Self-pay | Admitting: Nurse Practitioner

## 2019-07-29 ENCOUNTER — Encounter: Payer: Self-pay | Admitting: Nurse Practitioner

## 2019-07-29 ENCOUNTER — Ambulatory Visit (INDEPENDENT_AMBULATORY_CARE_PROVIDER_SITE_OTHER): Payer: BC Managed Care – PPO | Admitting: Nurse Practitioner

## 2019-07-29 ENCOUNTER — Other Ambulatory Visit: Payer: Self-pay

## 2019-07-29 VITALS — BP 126/80 | HR 60 | Temp 98.5°F | Ht 64.0 in | Wt 201.0 lb

## 2019-07-29 DIAGNOSIS — S81811D Laceration without foreign body, right lower leg, subsequent encounter: Secondary | ICD-10-CM

## 2019-07-29 NOTE — Progress Notes (Signed)
Subjective:     Patient ID: Dana Vasquez , female    DOB: 1966-10-22 , 53 y.o.   MRN: [id number]   Chief Complaint  Patient presents with  . Wound Check    patient stated she feels like her wound is getting better.    HPI  Recheck right ankle laceration.  She is doing better.     Past Medical History:  Diagnosis Date  . Allergy   . Anemia   . Arthritis    knees  AND SHOULDERS  . Asthma    allergy related - rarely uses inhaler  . Bruises easily   . Bunion   . Cancer (HCC)    1995;thyroid cancer, SURGERY AND RADIOACTIVE IODINE  . Ectopic pregnancy   . Euthyroid   . Fatigue   . Fibromyalgia   . GERD (gastroesophageal reflux disease)   . Headache(784.0)    otc med prn - last one 02/2014  . History of blood transfusion 2000   In [place], [place] at [facility] - ? 4 units transfused  . Hypothyroidism   . Knee pain   . Plantar fasciitis   . Sleep apnea    uses CPAP  . SVD (spontaneous vaginal delivery)    x 3  . Thyroid disease    thyroidectomy 1995     Family History  Problem Relation Age of Onset  . Hypertension Mother   . Cancer Mother        uterine  . Cancer Father   . Cancer Brother        stomach; 11  . Early death Brother   . Stroke Maternal Uncle   . Heart disease Maternal Uncle   . Food Allergy Daughter   . Allergic rhinitis Daughter   . Allergic rhinitis Daughter   . Angioedema Neg Hx   . Asthma Neg Hx   . Eczema Neg Hx   . Immunodeficiency Neg Hx   . Urticaria Neg Hx      Current Outpatient Medications:  .  atorvastatin (LIPITOR) 10 MG tablet, Take 10 mg by mouth daily., Disp: , Rfl:  .  cephALEXin (KEFLEX) 500 MG capsule, Take 1 capsule (500 mg total) by mouth 4 (four) times daily for 10 days., Disp: 40 capsule, Rfl: 0 .  levothyroxine (SYNTHROID) 112 MCG tablet, Take 1 tablet (112 mcg total) by mouth daily., Disp: 30 tablet, Rfl: 1 .  Multiple Vitamins-Minerals (BARIATRIC MULTIVITAMINS/IRON) CAPS, Take 1 capsule by mouth daily. , Disp: ,  Rfl:    Allergies  Allergen Reactions  . Amoxicillin Other (See Comments)    Headache Has patient had a PCN reaction causing immediate rash, facial/tongue/throat swelling, SOB or lightheadedness with hypotension: No Has patient had a PCN reaction causing severe rash involving mucus membranes or skin necrosis: No Has patient had a PCN reaction that required hospitalization: No Has patient had a PCN reaction occurring within the last 10 years: Unknown If all of the above answers are "NO", then may proceed with Cephalosporin use.   . Nitrofurantoin Monohyd Macro Other (See Comments)    Pt stated she gets a bad yeast infection  . Dexilant [Dexlansoprazole]     "bad stomachache"  . Meloxicam     Upset stomach and stomach pains  . Budesonide Rash  . Formoterol Fumarate Rash     Review of Systems  Constitutional: Negative.   Respiratory: Negative.   Cardiovascular: Negative.  Negative for chest pain, palpitations and leg swelling.  Neurological: Negative for dizziness and  headaches.     Today's Vitals   07/29/19 0948  BP: 126/80  Pulse: 60  Temp: 98.5 F (36.9 C)  TempSrc: Oral  Weight: 201 lb (91.2 kg)  Height: 5\' 4"  (1.626 m)  PainSc: 2   PainLoc: Ankle   Body mass index is 34.5 kg/m.   Objective:  Physical Exam Vitals signs reviewed.  Constitutional:      Appearance: Normal appearance.  Cardiovascular:     Heart sounds: No murmur.  Skin:    General: Skin is warm and dry.     Capillary Refill: Capillary refill takes less than 2 seconds.     Coloration: Skin is not jaundiced.     Comments: Healing laceration to anterior right ankle, she does have a scabbed area to left end of laceration. No steristrips in place. Skin is hyperpigmented around the area.   Neurological:     General: No focal deficit present.     Mental Status: She is alert and oriented to person, place, and time.  Psychiatric:        Mood and Affect: Mood normal.        Behavior: Behavior normal.         Thought Content: Thought content normal.        Judgment: Judgment normal.         Assessment And Plan:     1. Laceration of skin of right lower leg, subsequent encounter  I will send her to the wound clinic for a second look.  Left medial side of laceration has a scab  Healing wound she continues with cephalexin  She is to use neosporin on the scabbed areas and keep open to air unless will be rubbing on clothes  Cleansed with NS and applied Neosporin and nonstick pad - Ambulatory referral to [facility], [place] DISTANCING DUE TO THE COVID-19 PANDEMIC.

## 2019-08-01 ENCOUNTER — Encounter: Payer: Self-pay | Admitting: Nurse Practitioner

## 2019-08-03 ENCOUNTER — Encounter: Payer: Self-pay | Admitting: Nurse Practitioner

## 2019-08-05 ENCOUNTER — Ambulatory Visit: Payer: BC Managed Care – PPO | Admitting: Podiatry

## 2019-08-05 ENCOUNTER — Encounter: Payer: Self-pay | Admitting: Podiatry

## 2019-08-05 ENCOUNTER — Ambulatory Visit (INDEPENDENT_AMBULATORY_CARE_PROVIDER_SITE_OTHER): Payer: BC Managed Care – PPO

## 2019-08-05 ENCOUNTER — Other Ambulatory Visit: Payer: Self-pay

## 2019-08-05 DIAGNOSIS — Q828 Other specified congenital malformations of skin: Secondary | ICD-10-CM | POA: Diagnosis not present

## 2019-08-05 DIAGNOSIS — M722 Plantar fascial fibromatosis: Secondary | ICD-10-CM

## 2019-08-05 NOTE — Progress Notes (Signed)
She presents today chief complaint of pain to her right plantar foot.  States that it really never went away after our complete fasciotomy that we performed.  States that recently she injured her anterior right leg when a piece of tile or something had cut her leg open anteriorly while she was having her bathroom renovated.  She states that currently the wound is not healing very well because of the swelling.  Objective: Vital signs are stable she is alert and oriented x3 pulses are palpable.  She has mild edema of the anterior leg and foot.  Porokeratotic lesion plantar aspect of the fifth metatarsal the right foot debrided today.  Wound appears to be healing very nicely there is no open lesions and wounds draining.  Assessment: Continued plantar fasciitis porokeratotic lesion plantar lateral aspect of the right foot.  Slowly healing wound.  Plan: Discussed etiology pathology and surgical therapies at this point time recommended compression anklet to help with the swelling so that the wound may heal faster.  Also debrided the reactive hyperkeratotic lesion plantar aspect of the right foot placed salicylic acid under occlusion we will follow-up with her in about a month or so.

## 2019-08-06 ENCOUNTER — Encounter: Payer: Self-pay | Admitting: Internal Medicine

## 2019-08-10 ENCOUNTER — Encounter: Payer: Self-pay | Admitting: Nurse Practitioner

## 2019-08-10 ENCOUNTER — Ambulatory Visit: Payer: BC Managed Care – PPO | Admitting: Nurse Practitioner

## 2019-08-10 ENCOUNTER — Other Ambulatory Visit: Payer: Self-pay

## 2019-08-10 VITALS — BP 120/72 | HR 62 | Temp 98.1°F | Ht 64.0 in | Wt 199.4 lb

## 2019-08-10 DIAGNOSIS — S81811D Laceration without foreign body, right lower leg, subsequent encounter: Secondary | ICD-10-CM | POA: Diagnosis not present

## 2019-08-10 NOTE — Progress Notes (Addendum)
Subjective:     Patient ID: Dana Vasquez , female    DOB: 11-Apr-1966 , 53 y.o.   MRN: [id number]   Chief Complaint  Patient presents with  . Wound Check    patient feels like there is thread inside her wound.    HPI  She is here today for a wound check, she feels she has a thread or suture remaining in the wound. She continues to have a little bit of pain. She attempted to remove this "foreign object" but had discomfort.    Past Medical History:  Diagnosis Date  . Allergy   . Anemia   . Arthritis    knees  AND SHOULDERS  . Asthma    allergy related - rarely uses inhaler  . Bruises easily   . Bunion   . Cancer (HCC)    1995;thyroid cancer, SURGERY AND RADIOACTIVE IODINE  . Ectopic pregnancy   . Euthyroid   . Fatigue   . Fibromyalgia   . GERD (gastroesophageal reflux disease)   . Headache(784.0)    otc med prn - last one 02/2014  . History of blood transfusion 2000   In [place], [place] at [place] - ? 4 units transfused  . Hypothyroidism   . Knee pain   . Plantar fasciitis   . Sleep apnea    uses CPAP  . SVD (spontaneous vaginal delivery)    x 3  . Thyroid disease    thyroidectomy 1995     Family History  Problem Relation Age of Onset  . Hypertension Mother   . Cancer Mother        uterine  . Cancer Father   . Cancer Brother        stomach; 69  . Early death Brother   . Stroke Maternal Uncle   . Heart disease Maternal Uncle   . Food Allergy Daughter   . Allergic rhinitis Daughter   . Allergic rhinitis Daughter   . Angioedema Neg Hx   . Asthma Neg Hx   . Eczema Neg Hx   . Immunodeficiency Neg Hx   . Urticaria Neg Hx      Current Outpatient Medications:  .  atorvastatin (LIPITOR) 10 MG tablet, Take 10 mg by mouth daily., Disp: , Rfl:  .  levothyroxine (SYNTHROID) 112 MCG tablet, Take 1 tablet (112 mcg total) by mouth daily., Disp: 30 tablet, Rfl: 1 .  Multiple Vitamins-Minerals (BARIATRIC MULTIVITAMINS/IRON) CAPS, Take 1 capsule by mouth daily. ,  Disp: , Rfl:    Allergies  Allergen Reactions  . Amoxicillin Other (See Comments)    Headache Has patient had a PCN reaction causing immediate rash, facial/tongue/throat swelling, SOB or lightheadedness with hypotension: No Has patient had a PCN reaction causing severe rash involving mucus membranes or skin necrosis: No Has patient had a PCN reaction that required hospitalization: No Has patient had a PCN reaction occurring within the last 10 years: Unknown If all of the above answers are "NO", then may proceed with Cephalosporin use.   . Nitrofurantoin Monohyd Macro Other (See Comments)    Pt stated she gets a bad yeast infection  . Dexilant [Dexlansoprazole]     "bad stomachache"  . Meloxicam     Upset stomach and stomach pains  . Budesonide Rash  . Formoterol Fumarate Rash     Review of Systems  Constitutional: Negative.   Respiratory: Negative.   Cardiovascular: Negative.   Skin:       Right ankle wound tender with  what she feels is thread  Neurological: Negative.      Today's Vitals   08/10/19 1553  BP: 120/72  Pulse: 62  Temp: 98.1 F (36.7 C)  TempSrc: Oral  Weight: 199 lb 6.4 oz (90.4 kg)  Height: 5\' 4"  (1.626 m)  PainSc: 0-No pain   Body mass index is 34.23 kg/m.   Objective:  Physical Exam Constitutional:      Appearance: Normal appearance.  Skin:    Comments: Right ankle healing laceration with some dark areas There is a small area that appears to have white thread Cleansed dried skin around ankle with NS  Neurological:     Mental Status: She is alert.         Assessment And Plan:     1. Laceration of skin of right lower leg, subsequent encounter  Slow healing wound with what appears to be white thread, this does not look like a suture  She has an appt with the wound clinic later this month  Will call to see if the [facility] wound center has an earlier appt and make patient aware  Continue to apply neosporin to area  Will obtain an  ankle xray to see if there is foreign body present - DG Ankle Complete Right; Future     Minette Brine, FNP    THE PATIENT IS ENCOURAGED TO PRACTICE SOCIAL DISTANCING DUE TO THE COVID-19 PANDEMIC.

## 2019-08-11 ENCOUNTER — Ambulatory Visit: Payer: Self-pay | Admitting: Adult Health

## 2019-08-12 ENCOUNTER — Telehealth: Payer: Self-pay | Admitting: *Deleted

## 2019-08-12 NOTE — Telephone Encounter (Signed)
Pt states she has some questions about the injury to the surgery foot.

## 2019-08-12 NOTE — Telephone Encounter (Signed)
Yes it is a calcified vessel that you can do nothing about.

## 2019-08-12 NOTE — Telephone Encounter (Signed)
Her wound was healing on her leg at her last office visit and the wound was closed. She can go to the wound clinic if she would like.

## 2019-08-12 NOTE — Telephone Encounter (Signed)
I called pt and asked how I could help and she stated she had x-rays at the 08/05/2019 office visit and she had some questions. I told pt I would direct her questions to Dr. Milinda Pointer and she stated she has a string in the front of her leg and her PCP was going to direct her to a wound care specialist, but she wanted to know what Dr. Milinda Pointer thought. I asked pt the location of the concerns and she stated the right leg "between the ankles".

## 2019-08-12 NOTE — Telephone Encounter (Signed)
I informed pt of Dr. Stephenie Acres recommendations and pt stated it looks like gauze. I told pt then the PCP could refer to the wound center.

## 2019-08-13 ENCOUNTER — Encounter: Payer: Self-pay | Admitting: Nurse Practitioner

## 2019-08-16 ENCOUNTER — Telehealth: Payer: Self-pay | Admitting: Podiatry

## 2019-08-16 NOTE — Telephone Encounter (Signed)
Called pt to change appointment time for visit on 10/05/19. While on the phone the pt wanted to know how she could go about requesting her x-rays that we took at her last visit. I told the pt she would need to fill out and sign a medical records release form. Pt requested the form be e-mailed to her at [email or web address].

## 2019-08-18 ENCOUNTER — Ambulatory Visit: Payer: BC Managed Care – PPO | Admitting: Dietician

## 2019-08-19 ENCOUNTER — Other Ambulatory Visit: Payer: Self-pay

## 2019-08-19 ENCOUNTER — Encounter: Payer: BC Managed Care – PPO | Attending: Physician Assistant | Admitting: Physician Assistant

## 2019-08-19 ENCOUNTER — Encounter: Payer: Self-pay | Admitting: Podiatry

## 2019-08-19 DIAGNOSIS — T8133XA Disruption of traumatic injury wound repair, initial encounter: Secondary | ICD-10-CM | POA: Diagnosis not present

## 2019-08-19 DIAGNOSIS — M797 Fibromyalgia: Secondary | ICD-10-CM | POA: Insufficient documentation

## 2019-08-19 DIAGNOSIS — J45909 Unspecified asthma, uncomplicated: Secondary | ICD-10-CM | POA: Insufficient documentation

## 2019-08-19 DIAGNOSIS — Z880 Allergy status to penicillin: Secondary | ICD-10-CM | POA: Insufficient documentation

## 2019-08-19 DIAGNOSIS — E11622 Type 2 diabetes mellitus with other skin ulcer: Secondary | ICD-10-CM | POA: Diagnosis not present

## 2019-08-19 DIAGNOSIS — Z8249 Family history of ischemic heart disease and other diseases of the circulatory system: Secondary | ICD-10-CM | POA: Diagnosis not present

## 2019-08-19 DIAGNOSIS — Y838 Other surgical procedures as the cause of abnormal reaction of the patient, or of later complication, without mention of misadventure at the time of the procedure: Secondary | ICD-10-CM | POA: Insufficient documentation

## 2019-08-19 DIAGNOSIS — Z888 Allergy status to other drugs, medicaments and biological substances status: Secondary | ICD-10-CM | POA: Diagnosis not present

## 2019-08-19 DIAGNOSIS — Z85850 Personal history of malignant neoplasm of thyroid: Secondary | ICD-10-CM | POA: Diagnosis not present

## 2019-08-19 DIAGNOSIS — G4730 Sleep apnea, unspecified: Secondary | ICD-10-CM | POA: Diagnosis not present

## 2019-08-19 DIAGNOSIS — L97812 Non-pressure chronic ulcer of other part of right lower leg with fat layer exposed: Secondary | ICD-10-CM | POA: Diagnosis not present

## 2019-08-19 DIAGNOSIS — Z9884 Bariatric surgery status: Secondary | ICD-10-CM | POA: Insufficient documentation

## 2019-08-19 DIAGNOSIS — M79676 Pain in unspecified toe(s): Secondary | ICD-10-CM

## 2019-08-19 DIAGNOSIS — T8131XA Disruption of external operation (surgical) wound, not elsewhere classified, initial encounter: Secondary | ICD-10-CM | POA: Diagnosis present

## 2019-08-19 NOTE — Progress Notes (Addendum)
RIKA, BEAM ([id number]) Visit Report for 08/19/2019 Allergy List Details Patient Name: TENIAH, AGNER Date of Service: 08/19/2019 2:15 PM Medical Record Number: [id number] Patient Account Number: [id number] Date of Birth/Sex: 12-03-1965 (53 y.o. Female) Treating RN: Harold Barban Primary Care Darianna Amy: Glendale Chard Other Clinician: Referring Lindsey Demonte: Glendale Chard Treating Kimbella Heisler/Extender: Melburn Hake, HOYT Weeks in Treatment: 0 Allergies Active Allergies amoxicillin Reaction: headache, rash, swelling (face, tongue, throat) Severity: Severe nitrofurantoin macrocrystalline Reaction: yeast infection Severity: Moderate Dexilant Reaction: stomach pains Severity: Mild meloxicam Reaction: stomach pains budesonide Reaction: rash Severity: Moderate formoterol fumarate Reaction: rash Severity: Mild Allergy Notes Electronic Signature(s) Signed: 08/19/2019 11:51:18 AM By: Harold Barban Entered By: Harold Barban on 08/19/2019 11:51:17 TAY-Kopischke, Sheyla Johnette Abraham ([id number]) -------------------------------------------------------------------------------- Arrival Information Details Patient Name: Nonnie Done Date of Service: 08/19/2019 2:15 PM Medical Record Number: [id number] Patient Account Number: [id number] Date of Birth/Sex: [birth year] (53 y.o. Female) Treating RN: Army Melia Primary Care Lynsee Wands: Glendale Chard Other Clinician: Referring Taraann Olthoff: Glendale Chard Treating Ashvik Grundman/Extender: Melburn Hake, HOYT Weeks in Treatment: 0 Visit Information Patient Arrived: Ambulatory Arrival Time: 14:34 Accompanied By: husband Transfer Assistance: None Patient Identification Verified: Yes Secondary Verification Process Completed: Yes Electronic Signature(s) Signed: 08/19/2019 4:02:16 PM By: Lorine Bears RCP, RRT, CHT Entered By: Lorine Bears on 08/19/2019 14:34:31 TAY-Emberson, Mitzy Johnette Abraham  ([id number]) -------------------------------------------------------------------------------- Clinic Level of Care Assessment Details Patient Name: Nonnie Done Date of Service: 08/19/2019 2:15 PM Medical Record Number: [id number] Patient Account Number: [id number] Date of Birth/Sex: 1965/11/01 (53 y.o. Female) Treating RN: Army Melia Primary Care Maxwell Lemen: Glendale Chard Other Clinician: Referring Emilyanne Mcgough: Glendale Chard Treating Airyana Sprunger/Extender: Melburn Hake, HOYT Weeks in Treatment: 0 Clinic Level of Care Assessment Items TOOL 2 Quantity Score []  - Use when only an EandM is performed on the INITIAL visit 0 ASSESSMENTS - Nursing Assessment / Reassessment X - General Physical Exam (combine w/ comprehensive assessment (listed just below) when 1 20 performed on new pt. evals) X- 1 25 Comprehensive Assessment (HX, ROS, Risk Assessments, Wounds Hx, etc.) ASSESSMENTS - Wound and Skin Assessment / Reassessment X - Simple Wound Assessment / Reassessment - one wound 1 5 []  - 0 Complex Wound Assessment / Reassessment - multiple wounds []  - 0 Dermatologic / Skin Assessment (not related to wound area) ASSESSMENTS - Ostomy and/or Continence Assessment and Care []  - Incontinence Assessment and Management 0 []  - 0 Ostomy Care Assessment and Management (repouching, etc.) PROCESS - Coordination of Care X - Simple Patient / Family Education for ongoing care 1 15 []  - 0 Complex (extensive) Patient / Family Education for ongoing care X- 1 10 Staff obtains Programmer, systems, Records, Test Results / Process Orders []  - 0 Staff telephones HHA, Nursing Homes / Clarify orders / etc []  - 0 Routine Transfer to another Facility (non-emergent condition) []  - 0 Routine Hospital Admission (non-emergent condition) X- 1 15 New Admissions / Biomedical engineer / Ordering NPWT, Apligraf, etc. []  - 0 Emergency Hospital Admission (emergent condition) X- 1 10 Simple Discharge Coordination []  -  0 Complex (extensive) Discharge Coordination PROCESS - Special Needs []  - Pediatric / Minor Patient Management 0 []  - 0 Isolation Patient Management TAY-Stallbaumer, Brylea E. ([id number]) []  - 0 Hearing / Language / Visual special needs []  - 0 Assessment of Community assistance (transportation, D/C planning, etc.) []  - 0 Additional assistance / Altered mentation []  - 0 Support Surface(s) Assessment (bed, cushion, seat, etc.) INTERVENTIONS - Wound Cleansing / Measurement X - Wound Imaging (photographs - any number of wounds) 1 5 []  -  0 Wound Tracing (instead of photographs) X- 1 5 Simple Wound Measurement - one wound []  - 0 Complex Wound Measurement - multiple wounds X- 1 5 Simple Wound Cleansing - one wound []  - 0 Complex Wound Cleansing - multiple wounds INTERVENTIONS - Wound Dressings X - Small Wound Dressing one or multiple wounds 1 10 []  - 0 Medium Wound Dressing one or multiple wounds []  - 0 Large Wound Dressing one or multiple wounds []  - 0 Application of Medications - injection INTERVENTIONS - Miscellaneous []  - External ear exam 0 []  - 0 Specimen Collection (cultures, biopsies, blood, body fluids, etc.) []  - 0 Specimen(s) / Culture(s) sent or taken to Lab for analysis []  - 0 Patient Transfer (multiple staff / Civil Service fast streamer / Similar devices) []  - 0 Simple Staple / Suture removal (25 or less) []  - 0 Complex Staple / Suture removal (26 or more) []  - 0 Hypo / Hyperglycemic Management (close monitor of Blood Glucose) []  - 0 Ankle / Brachial Index (ABI) - do not check if billed separately Has the patient been seen at the hospital within the last three years: Yes Total Score: 125 Level Of Care: New/Established - Level 4 Electronic Signature(s) Signed: 08/19/2019 4:16:03 PM By: Army Melia Entered By: Army Melia on 08/19/2019 15:29:25 TAY-Yount, Shannette Johnette Abraham ([id number]) -------------------------------------------------------------------------------- Encounter  Discharge Information Details Patient Name: Nonnie Done Date of Service: 08/19/2019 2:15 PM Medical Record Number: [id number] Patient Account Number: [id number] Date of Birth/Sex: 05-19-1966 (53 y.o. Female) Treating RN: Army Melia Primary Care Alizay Bronkema: Glendale Chard Other Clinician: Referring Halie Gass: Glendale Chard Treating Matthewjames Petrasek/Extender: Melburn Hake, HOYT Weeks in Treatment: 0 Encounter Discharge Information Items Discharge Condition: Stable Ambulatory Status: Ambulatory Discharge Destination: Home Transportation: Private Auto Accompanied By: family Schedule Follow-up Appointment: Yes Clinical Summary of Care: Electronic Signature(s) Signed: 08/19/2019 4:16:03 PM By: Army Melia Entered By: Army Melia on 08/19/2019 15:30:14 TAY-Foiles, Kemi Johnette Abraham ([id number]) -------------------------------------------------------------------------------- Lower Extremity Assessment Details Patient Name: Nonnie Done Date of Service: 08/19/2019 2:15 PM Medical Record Number: [id number] Patient Account Number: [id number] Date of Birth/Sex: Sep 14, 1966 (53 y.o. Female) Treating RN: Cornell Barman Primary Care Anedra Penafiel: Glendale Chard Other Clinician: Referring Eleena Grater: Glendale Chard Treating Taivon Haroon/Extender: Melburn Hake, HOYT Weeks in Treatment: 0 Vascular Assessment Pulses: Dorsalis Pedis Palpable: [Right:Yes] Posterior Tibial Palpable: [Right:Yes] Electronic Signature(s) Signed: 08/20/2019 5:23:28 PM By: Gretta Cool, BSN, RN, CWS, Kim RN, BSN Entered By: Gretta Cool, BSN, RN, CWS, Kim on 08/19/2019 14:40:50 TAY-Schneiderman, Kern Alberta ([id number]) -------------------------------------------------------------------------------- Multi Wound Chart Details Patient Name: Nonnie Done Date of Service: 08/19/2019 2:15 PM Medical Record Number: [id number] Patient Account Number: [id number] Date of Birth/Sex: 1966-02-23 (53 y.o. Female) Treating RN: Army Melia Primary Care Elzy Tomasello:  Glendale Chard Other Clinician: Referring Makilah Dowda: Glendale Chard Treating Vi Biddinger/Extender: Melburn Hake, HOYT Weeks in Treatment: 0 Vital Signs Height(in): 64 Pulse(bpm): 65 Weight(lbs): 199 Blood Pressure(mmHg): 133/68 Body Mass Index(BMI): 34 Temperature(F): 98.4 Respiratory Rate 16 (breaths/min): Photos: [N/A:N/A] Wound Location: Right Lower Leg - Medial, N/A N/A Anterior Wounding Event: Trauma N/A N/A Primary Etiology: Trauma, Other N/A N/A Comorbid History: Anemia, Asthma, Sleep N/A N/A Apnea, Osteoarthritis Date Acquired: 07/02/2019 N/A N/A Weeks of Treatment: 0 N/A N/A Wound Status: Open N/A N/A Measurements L x W x D 0.2x0.2x0.1 N/A N/A (cm) Area (cm) : 0.031 N/A N/A Volume (cm) : 0.003 N/A N/A Classification: Partial Thickness N/A N/A Exudate Amount: None Present N/A N/A Wound Margin: Flat and Intact N/A N/A Granulation Amount: None Present (0%) N/A N/A Necrotic Amount: None Present (0%) N/A N/A  Exposed Structures: Fascia: No N/A N/A Fat Layer (Subcutaneous Tissue) Exposed: No Tendon: No Muscle: No Joint: No Bone: No Epithelialization: Large (67-100%) N/A N/A Treatment Notes COUA, CAUDILL ([id number]) Electronic Signature(s) Signed: 08/19/2019 4:16:03 PM By: Army Melia Entered By: Army Melia on 08/19/2019 15:13:36 TAY-Ballew, Maanya Johnette Abraham ([id number]) -------------------------------------------------------------------------------- [place] Details Patient Name: Nonnie Done Date of Service: 08/19/2019 2:15 PM Medical Record Number: [id number] Patient Account Number: [id number] Date of Birth/Sex: [birth year] (53 y.o. Female) Treating RN: Army Melia Primary Care Champagne Paletta: Glendale Chard Other Clinician: Referring Sophiagrace Benbrook: Glendale Chard Treating Elhadji Pecore/Extender: Melburn Hake, HOYT Weeks in Treatment: 0 Active Inactive Orientation to the Wound Care Program Nursing Diagnoses: Knowledge deficit related to the wound  healing center program Goals: Patient/caregiver will verbalize understanding of the [facility] Program Date Initiated: 08/19/2019 Target Resolution Date: 09/03/2019 Goal Status: Active Interventions: Provide education on orientation to the wound center Notes: Wound/Skin Impairment Nursing Diagnoses: Impaired tissue integrity Goals: Ulcer/skin breakdown will have a volume reduction of 30% by week 4 Date Initiated: 08/19/2019 Target Resolution Date: 09/10/2019 Goal Status: Active Interventions: Assess ulceration(s) every visit Notes: Electronic Signature(s) Signed: 08/19/2019 4:16:03 PM By: Army Melia Entered By: Army Melia on 08/19/2019 15:13:16 TAY-Ernsberger, Mckenlee EMarland Kitchen ([id number]) -------------------------------------------------------------------------------- Pain Assessment Details Patient Name: Nonnie Done Date of Service: 08/19/2019 2:15 PM Medical Record Number: [id number] Patient Account Number: [id number] Date of Birth/Sex: Mar 24, 1966 (53 y.o. Female) Treating RN: Army Melia Primary Care Janazia Schreier: Glendale Chard Other Clinician: Referring Arva Slaugh: Glendale Chard Treating Eldrick Penick/Extender: Melburn Hake, HOYT Weeks in Treatment: 0 Active Problems Location of Pain Severity and Description of Pain Patient Has Paino Yes Site Locations Rate the pain. Current Pain Level: 4 Pain Management and Medication Current Pain Management: Electronic Signature(s) Signed: 08/19/2019 4:02:16 PM By: Lorine Bears RCP, RRT, CHT Signed: 08/19/2019 4:16:03 PM By: Army Melia Entered By: Lorine Bears on 08/19/2019 14:35:00 TAY-Deniston, Kern Alberta ([id number]) -------------------------------------------------------------------------------- Patient/Caregiver Education Details Patient Name: Nonnie Done Date of Service: 08/19/2019 2:15 PM Medical Record Number: [id number] Patient Account Number: [id number] Date of Birth/Gender:  1966-08-02 (53 y.o. Female) Treating RN: Army Melia Primary Care Physician: Glendale Chard Other Clinician: Referring Physician: Glendale Chard Treating Physician/Extender: Sharalyn Ink in Treatment: 0 Education Assessment Education Provided To: Patient Education Topics Provided Wound/Skin Impairment: Handouts: Caring for Your Ulcer Methods: Demonstration, Explain/Verbal Responses: State content correctly Electronic Signature(s) Signed: 08/19/2019 4:16:03 PM By: Army Melia Entered By: Army Melia on 08/19/2019 15:29:37 TAY-Bergman, Meridee Johnette Abraham ([id number]) -------------------------------------------------------------------------------- Wound Assessment Details Patient Name: Nonnie Done Date of Service: 08/19/2019 2:15 PM Medical Record Number: [id number] Patient Account Number: [id number] Date of Birth/Sex: Mar 13, 1966 (53 y.o. Female) Treating RN: Cornell Barman Primary Care Louise Rawson: Glendale Chard Other Clinician: Referring Coda Filler: Glendale Chard Treating Kymberlee Viger/Extender: Melburn Hake, HOYT Weeks in Treatment: 0 Wound Status Wound Number: 1 Primary Etiology: Trauma, Other Wound Location: Right Lower Leg - Medial, Anterior Wound Status: Open Wounding Event: Trauma Comorbid Anemia, Asthma, Sleep Apnea, History: Osteoarthritis Date Acquired: 07/02/2019 Weeks Of Treatment: 0 Clustered Wound: No Photos Wound Measurements Length: (cm) 0.2 Width: (cm) 0.2 Depth: (cm) 0.1 Area: (cm) 0.031 Volume: (cm) 0.003 % Reduction in Area: % Reduction in Volume: Epithelialization: Large (67-100%) Tunneling: No Undermining: No Wound Description Classification: Partial Thickness Foul Od Wound Margin: Flat and Intact Slough/ Exudate Amount: None Present or After Cleansing: No Fibrino No Wound Bed Granulation Amount: None Present (0%) Exposed Structure Necrotic Amount: None Present (0%) Fascia Exposed: No Fat Layer (Subcutaneous Tissue) Exposed: No  Tendon Exposed:  No Muscle Exposed: No Joint Exposed: No Bone Exposed: No Treatment Notes Wound #1 (Right, Medial, Anterior Lower Leg) Notes ZULMY, BETTCHER. ([id number]) BFD Electronic Signature(s) Signed: 08/20/2019 5:23:28 PM By: Gretta Cool, BSN, RN, CWS, Kim RN, BSN Entered By: Gretta Cool, BSN, RN, CWS, Kim on 08/19/2019 14:40:28 TAY-Foots, Kern Alberta ([id number]) -------------------------------------------------------------------------------- Vitals Details Patient Name: Nonnie Done Date of Service: 08/19/2019 2:15 PM Medical Record Number: [id number] Patient Account Number: [id number] Date of Birth/Sex: 1966/02/20 (53 y.o. Female) Treating RN: Army Melia Primary Care Jurney Overacker: Glendale Chard Other Clinician: Referring Azam Gervasi: Glendale Chard Treating Aamari Strawderman/Extender: Melburn Hake, HOYT Weeks in Treatment: 0 Vital Signs Time Taken: 14:35 Temperature (F): 98.4 Height (in): 64 Pulse (bpm): 65 Source: Stated Respiratory Rate (breaths/min): 16 Weight (lbs): 199 Blood Pressure (mmHg): 133/68 Source: Stated Reference Range: 80 - 120 mg / dl Body Mass Index (BMI): 34.2 Electronic Signature(s) Signed: 08/19/2019 4:02:16 PM By: Lorine Bears RCP, RRT, CHT Entered By: Lorine Bears on 08/19/2019 14:35:56

## 2019-08-21 NOTE — Progress Notes (Signed)
SOLE, WHEATLEY ([id number]) Visit Report for 08/19/2019 Abuse/Suicide Risk Screen Details Patient Name: Dana Vasquez, Dana Vasquez Date of Service: 08/19/2019 2:15 PM Medical Record Number: [id number] Patient Account Number: [id number] Date of Birth/Sex: Apr 06, 1966 (53 y.o. Female) Treating RN: Cornell Barman Primary Care Addalyne Vandehei: Glendale Chard Other Clinician: Referring Lacara Dunsworth: Glendale Chard Treating Paysen Goza/Extender: Melburn Hake, HOYT Weeks in Treatment: 0 Abuse/Suicide Risk Screen Items Answer ABUSE RISK SCREEN: Has anyone close to you tried to hurt or harm you recentlyo No Do you feel uncomfortable with anyone in your familyo No Has anyone forced you do things that you didnot want to doo No Electronic Signature(s) Signed: 08/20/2019 5:23:28 PM By: Gretta Cool, BSN, RN, CWS, Kim RN, BSN Entered By: Gretta Cool, BSN, RN, CWS, Kim on 08/19/2019 14:41:00 TAY-Wawrzyniak, Kern Alberta ([id number]) -------------------------------------------------------------------------------- Activities of Daily Living Details Patient Name: Dana Vasquez Date of Service: 08/19/2019 2:15 PM Medical Record Number: [id number] Patient Account Number: [id number] Date of Birth/Sex: 1966-07-19 (53 y.o. Female) Treating RN: Cornell Barman Primary Care Toshie Demelo: Glendale Chard Other Clinician: Referring Takiya Belmares: Glendale Chard Treating Kalimah Capurro/Extender: Melburn Hake, HOYT Weeks in Treatment: 0 Activities of Daily Living Items Answer Activities of Daily Living (Please select one for each item) Drive Automobile Completely Able Take Medications Completely Able Use Telephone Completely Able Care for Appearance Completely Able Use Toilet Completely Able Bath / Shower Completely Able Dress Self Completely Able Feed Self Completely Able Walk Completely Able Get In / Out Bed Completely Able Housework Completely Able Prepare Meals Completely Liborio Negron Torres for Self Completely Able Electronic  Signature(s) Signed: 08/20/2019 5:23:28 PM By: Gretta Cool, BSN, RN, CWS, Kim RN, BSN Entered By: Gretta Cool, BSN, RN, CWS, Kim on 08/19/2019 14:41:10 Dana Vasquez ([id number]) -------------------------------------------------------------------------------- Education Screening Details Patient Name: Dana Vasquez Date of Service: 08/19/2019 2:15 PM Medical Record Number: [id number] Patient Account Number: [id number] Date of Birth/Sex: Apr 06, 1966 (53 y.o. Female) Treating RN: Cornell Barman Primary Care Trinnity Breunig: Glendale Chard Other Clinician: Referring Denika Krone: Glendale Chard Treating Brenlyn Beshara/Extender: Sharalyn Ink in Treatment: 0 Primary Learner Assessed: Patient Learning Preferences/Education Level/Primary Language Learning Preference: Explanation, Demonstration Highest Education Level: College or Above Preferred Language: English Cognitive Barrier Language Barrier: No Translator Needed: No Memory Deficit: No Emotional Barrier: No Cultural/Religious Beliefs Affecting Medical Care: No Physical Barrier Impaired Vision: No Impaired Hearing: No Decreased Hand dexterity: No Knowledge/Comprehension Knowledge Level: High Comprehension Level: High Ability to understand written High instructions: Ability to understand verbal High instructions: Motivation Anxiety Level: Calm Cooperation: Cooperative Education Importance: Acknowledges Need Interest in Health Problems: Asks Questions Perception: Coherent Willingness to Engage in Self- High Management Activities: Readiness to Engage in Self- High Management Activities: Electronic Signature(s) Signed: 08/20/2019 5:23:28 PM By: Gretta Cool, BSN, RN, CWS, Kim RN, BSN Entered By: Gretta Cool, BSN, RN, CWS, Kim on 08/19/2019 14:41:37 TAY-Brabson, Kern Alberta ([id number]) -------------------------------------------------------------------------------- Fall Risk Assessment Details Patient Name: Dana Vasquez Date of Service:  08/19/2019 2:15 PM Medical Record Number: [id number] Patient Account Number: [id number] Date of Birth/Sex: 09-19-1966 (53 y.o. Female) Treating RN: Cornell Barman Primary Care Jamiracle Avants: Glendale Chard Other Clinician: Referring Siah Steely: Glendale Chard Treating Theo Reither/Extender: Melburn Hake, HOYT Weeks in Treatment: 0 Fall Risk Assessment Items Have you had 2 or more falls in the last 12 monthso 0 No Have you had any fall that resulted in injury in the last 12 monthso 0 No FALLS RISK SCREEN History of falling - immediate or within 3 months 0 No Secondary diagnosis (Do you have 2 or more medical diagnoseso) 0 No Ambulatory  aid None/bed rest/wheelchair/nurse 0 Yes Crutches/cane/walker 0 No Furniture 0 No Intravenous therapy Access/Saline/Heparin Lock 0 No Gait/Transferring Normal/ bed rest/ wheelchair 0 Yes Weak (short steps with or without shuffle, stooped but able to lift head while 0 No walking, may seek support from furniture) Impaired (short steps with shuffle, may have difficulty arising from chair, head 0 No down, impaired balance) Mental Status Oriented to own ability 0 Yes Electronic Signature(s) Signed: 08/20/2019 5:23:28 PM By: Gretta Cool, BSN, RN, CWS, Kim RN, BSN Entered By: Gretta Cool, BSN, RN, CWS, Kim on 08/19/2019 14:41:51 TAY-Bartling, Kern Alberta ([id number]) -------------------------------------------------------------------------------- Foot Assessment Details Patient Name: Dana Vasquez Date of Service: 08/19/2019 2:15 PM Medical Record Number: [id number] Patient Account Number: [id number] Date of Birth/Sex: [birth year] (53 y.o. Female) Treating RN: Cornell Barman Primary Care Hailey Miles: Glendale Chard Other Clinician: Referring Emanii Bugbee: Glendale Chard Treating Lillie Portner/Extender: Melburn Hake, HOYT Weeks in Treatment: 0 Foot Assessment Items Site Locations + = Sensation present, - = Sensation absent, C = Callus, U = Ulcer R = Redness, W = Warmth, M = Maceration, PU =  Pre-ulcerative lesion F = Fissure, S = Swelling, D = Dryness Assessment Right: Left: Other Deformity: No No Prior Foot Ulcer: No No Prior Amputation: No No Charcot Joint: No No Ambulatory Status: Ambulatory Without Help Gait: Steady Electronic Signature(s) Signed: 08/20/2019 5:23:28 PM By: Gretta Cool, BSN, RN, CWS, Kim RN, BSN Entered By: Gretta Cool, BSN, RN, CWS, Kim on 08/19/2019 14:43:04 TAY-Finkle, Kern Alberta ([id number]) -------------------------------------------------------------------------------- Nutrition Risk Screening Details Patient Name: Dana Vasquez Date of Service: 08/19/2019 2:15 PM Medical Record Number: [id number] Patient Account Number: [id number] Date of Birth/Sex: [birth year] (53 y.o. Female) Treating RN: Cornell Barman Primary Care Ryer Asato: Glendale Chard Other Clinician: Referring Lourdes Kucharski: Glendale Chard Treating Doyl Bitting/Extender: Melburn Hake, HOYT Weeks in Treatment: 0 Height (in): 64 Weight (lbs): 199 Body Mass Index (BMI): 34.2 Nutrition Risk Screening Items Score Screening NUTRITION RISK SCREEN: I have an illness or condition that made me change the kind and/or amount of 0 No food I eat I eat fewer than two meals per day 0 No I eat few fruits and vegetables, or milk products 0 No I have three or more drinks of beer, liquor or wine almost every day 0 No I have tooth or mouth problems that make it hard for me to eat 0 No I don't always have enough money to buy the food I need 0 No I eat alone most of the time 0 No I take three or more different prescribed or over-the-counter drugs a day 0 No Without wanting to, I have lost or gained 10 pounds in the last six months 0 No I am not always physically able to shop, cook and/or feed myself 0 No Nutrition Protocols Good Risk Protocol 0 No interventions needed Moderate Risk Protocol High Risk Proctocol Risk Level: Good Risk Score: 0 Electronic Signature(s) Signed: 08/20/2019 5:23:28 PM By: Gretta Cool, BSN, RN, CWS,  Kim RN, BSN Entered By: Gretta Cool, BSN, RN, CWS, Kim on 08/19/2019 14:41:59

## 2019-08-21 NOTE — Progress Notes (Signed)
Dana Vasquez ([id number]) Visit Report for 08/19/2019 Chief Complaint Document Details Patient Name: Dana Vasquez Date of Service: 08/19/2019 2:15 PM Medical Record Number: [id number] Patient Account Number: [id number] Date of Birth/Sex: 1965-12-11 (53 y.o. Female) Treating RN: Army Melia Primary Care Provider: Glendale Chard Other Clinician: Referring Provider: Glendale Chard Treating Provider/Extender: Melburn Hake, Shira Bobst Weeks in Vasquez: 0 Information Obtained from: Patient Chief Complaint Right leg surgical ulcer Electronic Signature(s) Signed: 08/19/2019 2:52:33 PM By: Worthy Keeler PA-C Entered By: Worthy Keeler on 08/19/2019 14:52:32 Dana Vasquez, Dana EMarland Kitchen ([id number]) -------------------------------------------------------------------------------- HPI Details Patient Name: Dana Vasquez Date of Service: 08/19/2019 2:15 PM Medical Record Number: [id number] Patient Account Number: [id number] Date of Birth/Sex: 1966-08-01 (53 y.o. Female) Treating RN: Army Melia Primary Care Provider: Glendale Chard Other Clinician: Referring Provider: Glendale Chard Treating Provider/Extender: Melburn Hake, Dana Vasquez: 0 History of Present Illness HPI Description: 08/19/2019 upon evaluation today patient presents for initial inspection here in our clinic concerning issues that she has been having with what appears to be a spitting suture coming out of her suture repair on the lower extremity. She is concerned however about the fact that there may be a gauze stuck within the area. Fortunately there is no signs of active infection at this time. In fact she just has 1 small area of hyper granulation otherwise and other than that the wound is pretty much healed. Obviously this is good news but the patient is very concerned about the suture. She really does not have any major medical problems. This was a traumatic injury. She is status post bariatric surgery prior to  that she did have diabetes since then she is not had any issues. Electronic Signature(s) Signed: 08/19/2019 6:06:59 PM By: Worthy Keeler PA-C Entered By: Worthy Keeler on 08/19/2019 18:06:59 Dana Vasquez, Dana Vasquez. ([id number]) -------------------------------------------------------------------------------- Otelia Sergeant TISS Details Patient Name: Dana Vasquez Date of Service: 08/19/2019 2:15 PM Medical Record Number: [id number] Patient Account Number: [id number] Date of Birth/Sex: 04/26/1966 (53 y.o. Female) Treating RN: Army Melia Primary Care Provider: Glendale Chard Other Clinician: Referring Provider: Glendale Chard Treating Provider/Extender: Melburn Hake, Crewe Heathman Weeks in Vasquez: 0 Procedure Performed for: Wound #1 Right,Medial,Anterior Lower Leg Performed By: Physician STONE III, Danya Spearman Vasquez., PA-C Post Procedure Diagnosis Same as Pre-procedure Notes 1 stick of silver nitrate used Electronic Signature(s) Signed: 08/19/2019 6:10:06 PM By: Worthy Keeler PA-C Entered By: Worthy Keeler on 08/19/2019 18:10:06 Dana Vasquez, Dana EMarland Kitchen ([id number]) -------------------------------------------------------------------------------- Physical Exam Details Patient Name: Dana Vasquez Date of Service: 08/19/2019 2:15 PM Medical Record Number: [id number] Patient Account Number: [id number] Date of Birth/Sex: [birth year] (53 y.o. Female) Treating RN: Army Melia Primary Care Provider: Glendale Chard Other Clinician: Referring Provider: Glendale Chard Treating Provider/Extender: Melburn Hake, Charan Prieto Weeks in Vasquez: 0 Constitutional sitting or standing blood pressure is within target range for patient.. pulse regular and within target range for patient.Marland Kitchen respirations regular, non-labored and within target range for patient.Marland Kitchen temperature within target range for patient.. Well- nourished and well-hydrated in no acute distress. Eyes conjunctiva clear no eyelid edema noted. pupils  equal round and reactive to light and accommodation. Ears, Nose, Mouth, and Throat no gross abnormality of ear auricles or external auditory canals. normal hearing noted during conversation. mucus membranes moist. Respiratory normal breathing without difficulty. clear to auscultation bilaterally. Cardiovascular regular rate and rhythm with normal S1, S2. 2+ dorsalis pedis/posterior tibialis pulses. no clubbing, cyanosis, significant edema, <3 sec cap refill. Gastrointestinal (GI) soft, non-tender, non-distended, +BS. no ventral  hernia noted. Musculoskeletal normal gait and posture. no significant deformity or arthritic changes, no loss or range of motion, no clubbing. Psychiatric this patient is able to make decisions and demonstrates good insight into disease process. Alert and Oriented x 3. pleasant and cooperative. Notes Upon inspection today patient's wound bed actually showed signs of actually being for the most part healed along the laceration line. She has a very small area of hyper granulation that I am going to treat today after obtaining consent from the patient. Subsequently I did actually also suggest that what we need to do is try to cut the suture which is spitting off at the base of it as far as we can to hopefully allow this area to heal over and hopefully she will not have any additional issues going forward. She is actually after discussion and agreement with the plan and we did proceed with such. I was able to remove the portion of suture which was sticking out she tolerated that without complication and overall seems to be doing very well. She did not even have any bleeding. I then performed chemical cauterization to the opening of the wound where she still had a secondary site that was open and used 1 stick of silver nitrate for this. Electronic Signature(s) Signed: 08/19/2019 6:08:19 PM By: Worthy Keeler PA-C Previous Signature: 08/19/2019 6:08:03 PM Version By:  Worthy Keeler PA-C Entered By: Worthy Keeler on 08/19/2019 18:08:18 Dana Vasquez, Dana Johnette Abraham ([id number]) -------------------------------------------------------------------------------- Physician Orders Details Patient Name: Dana Vasquez Date of Service: 08/19/2019 2:15 PM Medical Record Number: [id number] Patient Account Number: [id number] Date of Birth/Sex: 1966-03-24 (53 y.o. Female) Treating RN: Army Melia Primary Care Provider: Glendale Chard Other Clinician: Referring Provider: Glendale Chard Treating Provider/Extender: Melburn Hake, Ramiro Pangilinan Weeks in Vasquez: 0 Verbal / Phone Orders: No Diagnosis Coding ICD-10 Coding Code Description T81.31XA Disruption of external operation (surgical) wound, not elsewhere classified, initial encounter L97.812 Non-pressure chronic ulcer of other part of right lower leg with fat layer exposed Z98.84 Bariatric surgery status Wound Cleansing Wound #1 Right,Medial,Anterior Lower Leg o Clean wound with Normal Saline. Primary Wound Dressing o Boardered Foam Dressing Dressing Change Frequency o Change dressing every other day. Follow-up Appointments Wound #1 Right,Medial,Anterior Lower Leg o Return Appointment in 2 weeks. Electronic Signature(s) Signed: 08/19/2019 4:16:03 PM By: Army Melia Signed: 08/19/2019 6:36:47 PM By: Worthy Keeler PA-C Entered By: Army Melia on 08/19/2019 15:28:25 Dana Vasquez, Dana Alberta ([id number]) -------------------------------------------------------------------------------- Problem List Details Patient Name: Dana Vasquez Date of Service: 08/19/2019 2:15 PM Medical Record Number: [id number] Patient Account Number: [id number] Date of Birth/Sex: [birth year] (53 y.o. Female) Treating RN: Army Melia Primary Care Provider: Glendale Chard Other Clinician: Referring Provider: Glendale Chard Treating Provider/Extender: Melburn Hake, Karilynn Carranza Weeks in Vasquez: 0 Active Problems ICD-10 Evaluated  Encounter Code Description Active Date Today Diagnosis T81.31XA Disruption of external operation (surgical) wound, not 08/19/2019 No Yes elsewhere classified, initial encounter L97.812 Non-pressure chronic ulcer of other part of right lower leg 08/19/2019 No Yes with fat layer exposed Z98.84 Bariatric surgery status 08/19/2019 No Yes Inactive Problems Resolved Problems Electronic Signature(s) Signed: 08/19/2019 2:52:00 PM By: Worthy Keeler PA-C Entered By: Worthy Keeler on 08/19/2019 14:52:00 Dana Vasquez, Dana EMarland Kitchen ([id number]) -------------------------------------------------------------------------------- Progress Note Details Patient Name: Dana Vasquez Date of Service: 08/19/2019 2:15 PM Medical Record Number: [id number] Patient Account Number: [id number] Date of Birth/Sex: 01/23/1966 (53 y.o. Female) Treating RN: Army Melia Primary Care Provider: Glendale Chard Other Clinician: Referring Provider: Glendale Chard Treating Provider/Extender:  STONE III, Ranessa Kosta Weeks in Vasquez: 0 Subjective Chief Complaint Information obtained from Patient Right leg surgical ulcer History of Present Illness (HPI) 08/19/2019 upon evaluation today patient presents for initial inspection here in our clinic concerning issues that she has been having with what appears to be a spitting suture coming out of her suture repair on the lower extremity. She is concerned however about the fact that there may be a gauze stuck within the area. Fortunately there is no signs of active infection at this time. In fact she just has 1 small area of hyper granulation otherwise and other than that the wound is pretty much healed. Obviously this is good news but the patient is very concerned about the suture. She really does not have any major medical problems. This was a traumatic injury. She is status post bariatric surgery prior to that she did have diabetes since then she is not had any issues. Patient  History Allergies amoxicillin (Severity: Severe, Reaction: headache, rash, swelling (face, tongue, throat)), nitrofurantoin macrocrystalline (Severity: Moderate, Reaction: yeast infection), Dexilant (Severity: Mild, Reaction: stomach pains), meloxicam (Reaction: stomach pains), budesonide (Severity: Moderate, Reaction: rash), formoterol fumarate (Severity: Mild, Reaction: rash) Family History Cancer - Mother,Father,Siblings, Hypertension - Mother. Medical History Hematologic/Lymphatic Patient has history of Anemia Respiratory Patient has history of Asthma, Sleep Apnea - C-PAP Musculoskeletal Patient has history of Osteoarthritis - Knees and shoulders Medical And Surgical History Notes Gastrointestinal GERD Musculoskeletal Fibromyalgia Neurologic Headaches Oncologic Thyroid Cancer 1995, surgery and radioactive iodine Review of Systems (ROS) Constitutional Symptoms (General Health) Complains or has symptoms of Fatigue. Eyes TAY-Moorer, Tamma Vasquez. ([id number]) Denies complaints or symptoms of Dry Eyes, Vision Changes, Glasses / Contacts. Ear/Nose/Mouth/Throat Denies complaints or symptoms of Difficult clearing ears, Sinusitis. Hematologic/Lymphatic Denies complaints or symptoms of Bleeding / Clotting Disorders, Human Immunodeficiency Virus. Endocrine Complains or has symptoms of Thyroid disease - Thyroidectomy 1995. Denies complaints or symptoms of Hepatitis, Polydypsia (Excessive Thirst). Integumentary (Skin) Complains or has symptoms of Wounds, Bleeding or bruising tendency. Objective Constitutional sitting or standing blood pressure is within target range for patient.. pulse regular and within target range for patient.Marland Kitchen respirations regular, non-labored and within target range for patient.Marland Kitchen temperature within target range for patient.. Well- nourished and well-hydrated in no acute distress. Vitals Time Taken: 2:35 PM, Height: 64 in, Source: Stated, Weight: 199 lbs, Source:  Stated, BMI: 34.2, Temperature: 98.4 F, Pulse: 65 bpm, Respiratory Rate: 16 breaths/min, Blood Pressure: 133/68 mmHg. Eyes conjunctiva clear no eyelid edema noted. pupils equal round and reactive to light and accommodation. Ears, Nose, Mouth, and Throat no gross abnormality of ear auricles or external auditory canals. normal hearing noted during conversation. mucus membranes moist. Respiratory normal breathing without difficulty. clear to auscultation bilaterally. Cardiovascular regular rate and rhythm with normal S1, S2. 2+ dorsalis pedis/posterior tibialis pulses. no clubbing, cyanosis, significant edema, Gastrointestinal (GI) soft, non-tender, non-distended, +BS. no ventral hernia noted. Musculoskeletal normal gait and posture. no significant deformity or arthritic changes, no loss or range of motion, no clubbing. Psychiatric this patient is able to make decisions and demonstrates good insight into disease process. Alert and Oriented x 3. pleasant and cooperative. General Notes: Upon inspection today patient's wound bed actually showed signs of actually being for the most part healed along the laceration line. She has a very small area of hyper granulation that I am going to treat today after obtaining consent from the patient. Subsequently I did actually also suggest that what we need to do is try to cut the  suture which is spitting off at the base of it as far as we can to hopefully allow this area to heal over and hopefully she will not have any additional issues going forward. She is actually after discussion and agreement with the plan and we did proceed with such. I was able to remove the portion of suture which was sticking out she tolerated that without complication and overall seems to be doing TAY-Waymire, Marveline Vasquez. ([id number]) very well. She did not even have any bleeding. I then performed chemical cauterization to the opening of the wound where she still had a secondary site  that was open and used 1 stick of silver nitrate for this. Integumentary (Hair, Skin) Wound #1 status is Open. Original cause of wound was Trauma. The wound is located on the Right,Medial,Anterior Lower Leg. The wound measures 0.2cm length x 0.2cm width x 0.1cm depth; 0.031cm^2 area and 0.003cm^3 volume. There is no tunneling or undermining noted. There is a none present amount of drainage noted. The wound margin is flat and intact. There is no granulation within the wound bed. There is no necrotic tissue within the wound bed. 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 An CHEM CAUT GRANULATION TISS procedure was performed by STONE III, Brindley Madarang Vasquez., PA-C. Post procedure Diagnosis Wound #1: Same as Pre-Procedure Notes: 1 stick of silver nitrate used Plan Wound Cleansing: Wound #1 Right,Medial,Anterior Lower Leg: Clean wound with Normal Saline. Primary Wound Dressing: Boardered Foam Dressing Dressing Change Frequency: Change dressing every other day. Follow-up Appointments: Wound #1 Right,Medial,Anterior Lower Leg: Return Appointment in 2 weeks. 1. My suggestion at this time is good to be that we just utilize a border foam dressing over the area there is really no need in my opinion for any aggressive and expensive dressings I do not think that she needs much to allow this to heal appropriately. 2. I am going to suggest as well that she keep a close eye for the possibility of anything infected although again I do not see anything that looks like it is going to become infected in my opinion. TAY-Wojtas, Adreanne Vasquez. ([id number]) 3. I am going to recommend as well that she continue to  elevate her leg due to the swelling as much as she can again I think the swelling is due to the trauma and inflammation as that improves she should feel a lot better. We will see patient back for reevaluation in 2 weeks here in the clinic. If anything worsens or changes patient will contact our office for additional recommendations. Electronic Signature(s) Signed: 08/19/2019 6:10:17 PM By: Worthy Keeler PA-C Previous Signature: 08/19/2019 6:09:02 PM Version By: Worthy Keeler PA-C Entered By: Worthy Keeler on 08/19/2019 18:10:16 TAY-Zito, Jonathan EMarland Kitchen ([id number]) -------------------------------------------------------------------------------- ROS/PFSH Details Patient Name: Dana Vasquez Date of Service: 08/19/2019 2:15 PM Medical Record Number: [id number] Patient Account Number: [id number] Date of Birth/Sex: 1966-09-13 (53 y.o. Female) Treating RN: Cornell Barman Primary Care Provider: Glendale Chard Other Clinician: Referring Provider: Glendale Chard Treating Provider/Extender: Melburn Hake, Clydell Sposito Weeks in Vasquez: 0 Constitutional Symptoms (General Health) Complaints and Symptoms: Positive for: Fatigue Eyes Complaints and Symptoms: Negative for: Dry Eyes; Vision Changes; Glasses / Contacts Ear/Nose/Mouth/Throat Complaints and Symptoms: Negative for: Difficult clearing ears;  Sinusitis Hematologic/Lymphatic Complaints and Symptoms: Negative for: Bleeding / Clotting Disorders; Human Immunodeficiency Virus Medical History: Positive for: Anemia Endocrine Complaints and Symptoms: Positive for: Thyroid disease - Thyroidectomy 1995 Negative for: Hepatitis; Polydypsia (Excessive Thirst) Integumentary (Skin) Complaints and Symptoms: Positive for: Wounds; Bleeding or bruising tendency Respiratory Medical History: Positive for: Asthma; Sleep Apnea - C-PAP Cardiovascular Gastrointestinal Medical History: Past Medical History  Notes: GERD Genitourinary Immunological TAY-Lewelling, Albertia Vasquez. ([id number]) Musculoskeletal Medical History: Positive for: Osteoarthritis - Knees and shoulders Past Medical History Notes: Fibromyalgia Neurologic Medical History: Past Medical History Notes: Headaches Oncologic Medical History: Past Medical History Notes: Thyroid Cancer 1995, surgery and radioactive iodine Psychiatric Immunizations Implantable Devices None Family and Social History Cancer: Yes - Mother,Father,Siblings; Hypertension: Yes - Mother Electronic Signature(s) Signed: 08/19/2019 6:36:47 PM By: Worthy Keeler PA-C Signed: 08/20/2019 5:23:28 PM By: Gretta Cool, BSN, RN, CWS, Kim RN, BSN Entered By: Gretta Cool, BSN, RN, CWS, Kim on 08/19/2019 14:00:23 Dana Vasquez ([id number]) -------------------------------------------------------------------------------- SuperBill Details Patient Name: Dana Vasquez Date of Service: 08/19/2019 Medical Record Number: [id number] Patient Account Number: [id number] Date of Birth/Sex: 03-04-1966 (53 y.o. Female) Treating RN: Army Melia Primary Care Provider: Glendale Chard Other Clinician: Referring Provider: Glendale Chard Treating Provider/Extender: Melburn Hake, Izabelle Daus Weeks in Vasquez: 0 Diagnosis Coding ICD-10 Codes Code Description T81.31XA Disruption of external operation (surgical) wound, not elsewhere classified, initial encounter L97.812 Non-pressure chronic ulcer of other part of right lower leg with fat layer exposed Z98.84 Bariatric surgery status Facility Procedures CPT4: Description Modifier Quantity Code [id number] - WOUND CARE VISIT-LEV 4 EST PT 1 CPT4: [id number] - CHEM CAUT GRANULATION TISS 1 ICD-10 Diagnosis Description T81.31XA Disruption of external operation (surgical) wound, not elsewhere classified, initial encounter L97.812 Non-pressure chronic ulcer of other part of right lower leg  with fat layer exposed Physician Procedures CPT4:  Description Modifier Quantity Code [id number] WC PHYS LEVEL 3 o NEW PT 25 1 ICD-10 Diagnosis Description T81.31XA Disruption of external operation (surgical) wound, not elsewhere classified, initial encounter L97.812 Non-pressure chronic ulcer of  other part of right lower leg with fat layer exposed Z98.84 Bariatric surgery status CPT4: [id number] - WC PHYS CHEM CAUT GRAN TISSUE 1 ICD-10 Diagnosis Description T81.31XA Disruption of external operation (surgical) wound, not elsewhere classified, initial encounter L97.812 Non-pressure chronic ulcer of other part of right lower leg  with fat layer exposed Electronic Signature(s) Signed: 08/19/2019 6:10:38 PM By: Worthy Keeler PA-C Entered By: Worthy Keeler on 08/19/2019 18:10:38

## 2019-08-25 ENCOUNTER — Encounter: Payer: Self-pay | Admitting: Nurse Practitioner

## 2019-08-25 ENCOUNTER — Ambulatory Visit (HOSPITAL_BASED_OUTPATIENT_CLINIC_OR_DEPARTMENT_OTHER): Payer: BC Managed Care – PPO | Admitting: Physician Assistant

## 2019-09-02 ENCOUNTER — Ambulatory Visit: Payer: BC Managed Care – PPO | Admitting: Physician Assistant

## 2019-09-04 IMAGING — CR DG CHEST 2V
2 series · 2 of 2 positions shown · non-contrast
Comparison: 10/09/2017

CLINICAL DATA: Morbid obesity.

EXAM:
CHEST  2 VIEW

[w chest pa]
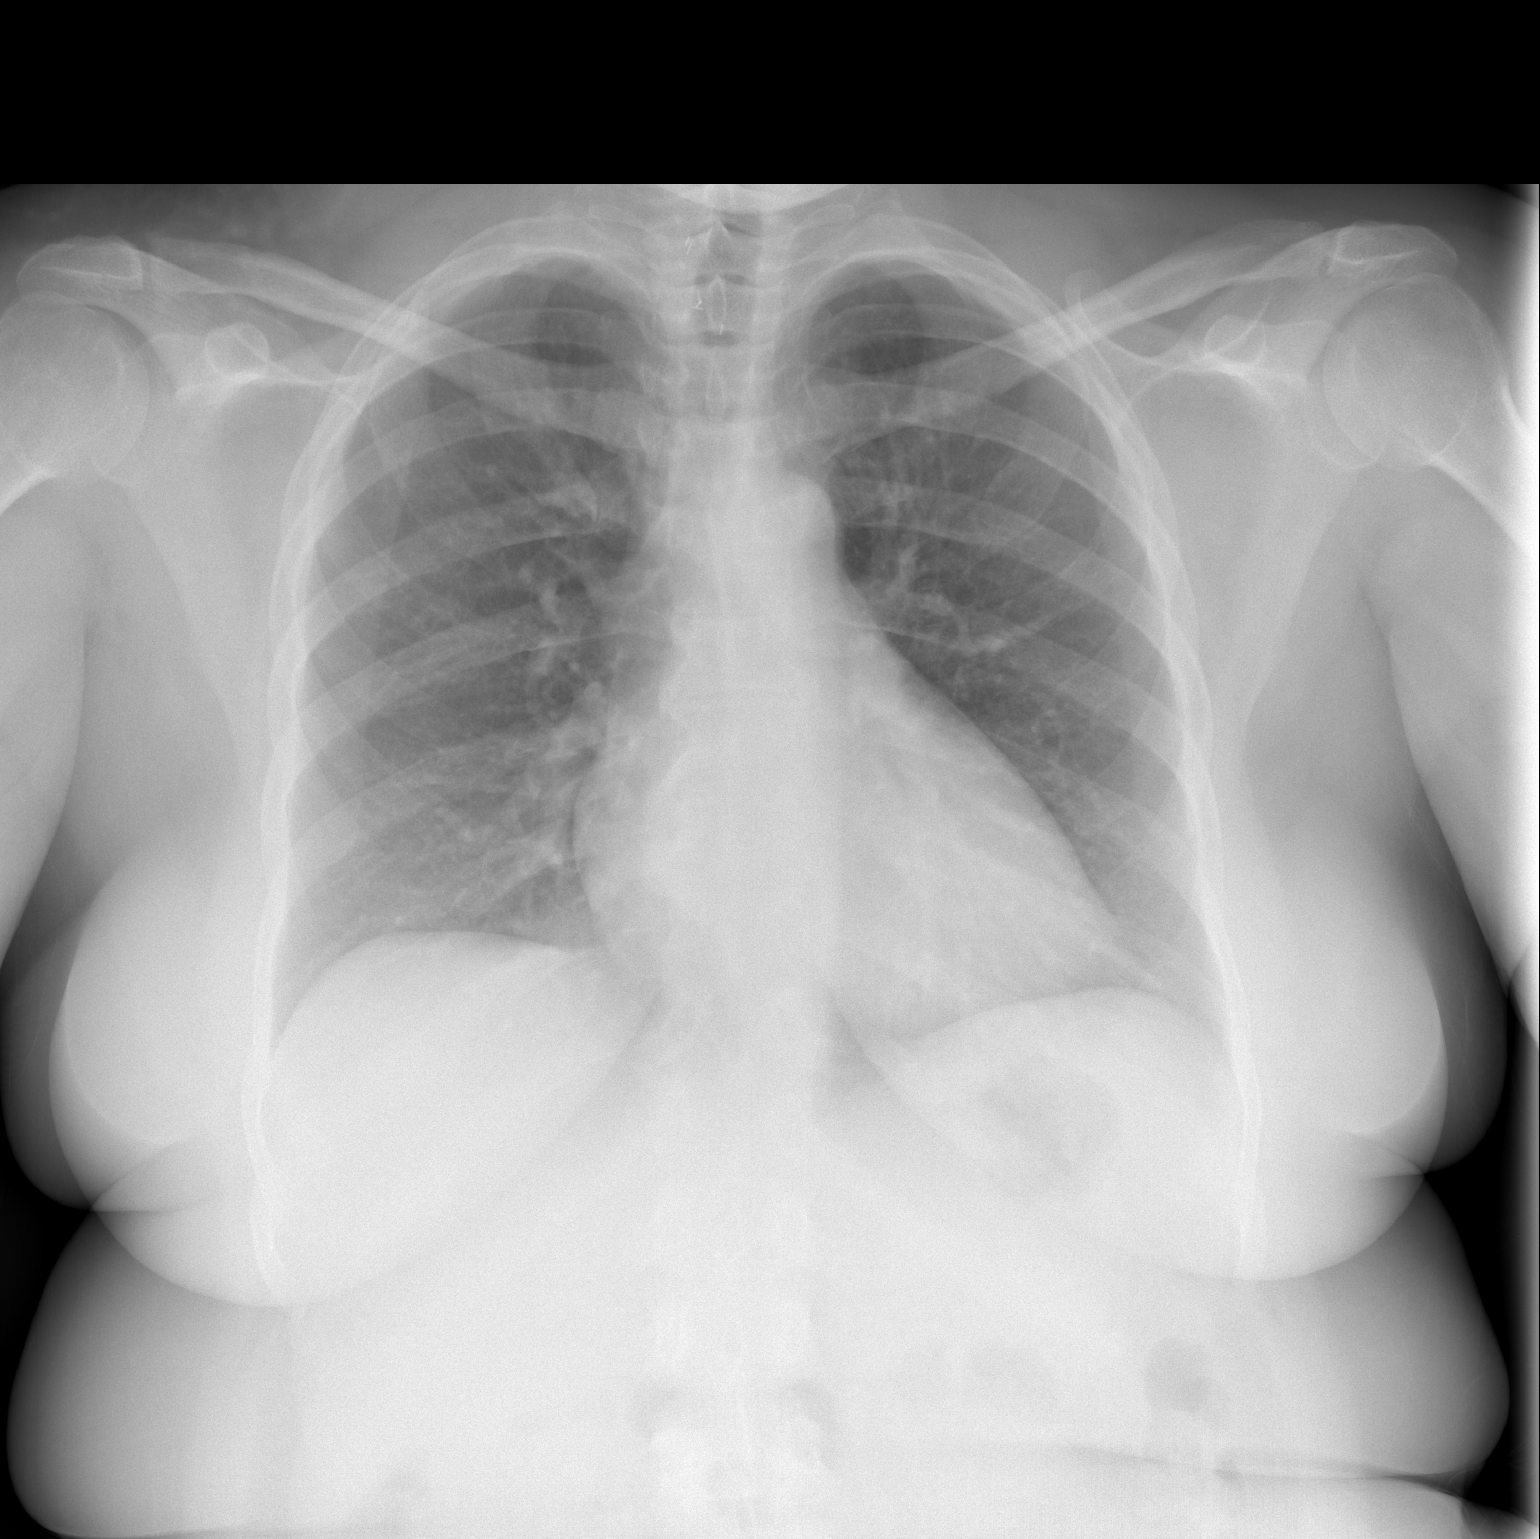

[w chest lat]
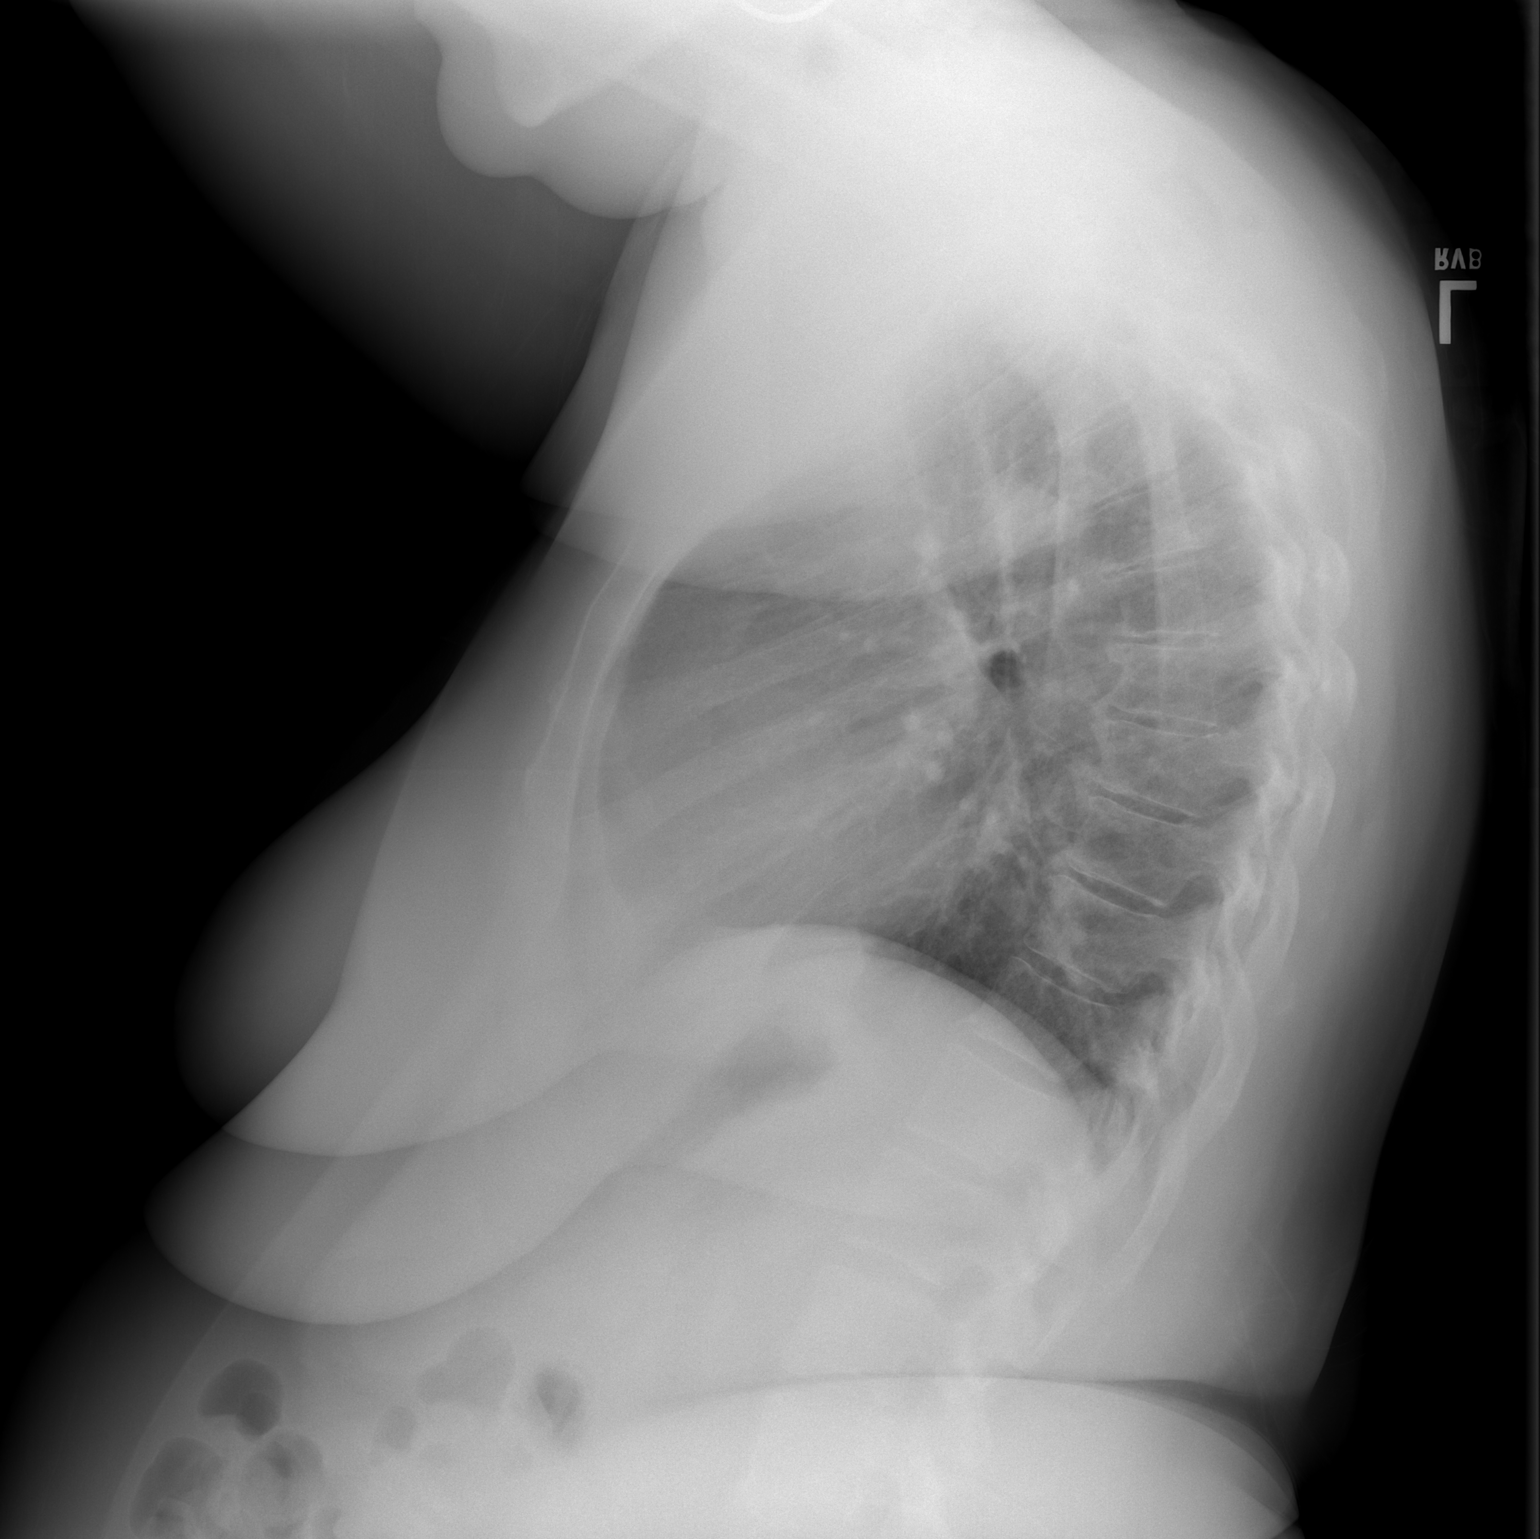

[2 of 2 positions shown; findings below may reference images not displayed]

FINDINGS: The heart size and mediastinal contours are within normal limits.
Both lungs are clear. The visualized skeletal structures are
unremarkable.
IMPRESSION: Essentially normal exam.

## 2019-09-09 ENCOUNTER — Other Ambulatory Visit: Payer: Self-pay

## 2019-09-09 ENCOUNTER — Encounter: Payer: BC Managed Care – PPO | Attending: Physician Assistant | Admitting: Physician Assistant

## 2019-09-09 DIAGNOSIS — Z85850 Personal history of malignant neoplasm of thyroid: Secondary | ICD-10-CM | POA: Insufficient documentation

## 2019-09-09 DIAGNOSIS — L97812 Non-pressure chronic ulcer of other part of right lower leg with fat layer exposed: Secondary | ICD-10-CM | POA: Diagnosis present

## 2019-09-09 DIAGNOSIS — Z09 Encounter for follow-up examination after completed treatment for conditions other than malignant neoplasm: Secondary | ICD-10-CM | POA: Insufficient documentation

## 2019-09-09 DIAGNOSIS — Z9884 Bariatric surgery status: Secondary | ICD-10-CM | POA: Diagnosis not present

## 2019-09-09 DIAGNOSIS — G4730 Sleep apnea, unspecified: Secondary | ICD-10-CM | POA: Insufficient documentation

## 2019-09-09 DIAGNOSIS — J45909 Unspecified asthma, uncomplicated: Secondary | ICD-10-CM | POA: Diagnosis not present

## 2019-09-09 DIAGNOSIS — M797 Fibromyalgia: Secondary | ICD-10-CM | POA: Diagnosis not present

## 2019-09-09 NOTE — Progress Notes (Addendum)
SCHEHERAZADE, PFENNIG ([id number]) Visit Report for 09/09/2019 Chief Complaint Document Details Patient Name: Dana Vasquez, Dana Vasquez Date of Service: 09/09/2019 3:00 PM Medical Record Number: [id number] Patient Account Number: [id number] Date of Birth/Sex: 1966-07-11 (54 y.o. F) Treating RN: Dana Vasquez Primary Care Provider: Glendale Vasquez Other Clinician: Referring Provider: Glendale Vasquez Treating Provider/Extender: Dana Vasquez, Dana Vasquez Weeks in Treatment: 3 Information Obtained from: Patient Chief Complaint Right leg surgical ulcer Electronic Signature(s) Signed: 09/09/2019 3:14:27 PM By: Dana Keeler PA-C Entered By: Dana Vasquez on 09/09/2019 15:14:26 Dana Vasquez, Dana Vasquez. ([id number]) -------------------------------------------------------------------------------- HPI Details Patient Name: Dana Vasquez Date of Service: 09/09/2019 3:00 PM Medical Record Number: [id number] Patient Account Number: [id number] Date of Birth/Sex: [birth year] (53 y.o. F) Treating RN: Dana Vasquez Primary Care Provider: Glendale Vasquez Other Clinician: Referring Provider: Glendale Vasquez Treating Provider/Extender: Dana Vasquez, Dana Vasquez Weeks in Treatment: 3 History of Present Illness HPI Description: 08/19/2019 upon evaluation today patient presents for initial inspection here in our clinic concerning issues that she has been having with what appears to be a spitting suture coming out of her suture repair on the lower extremity. She is concerned however about the fact that there may be a gauze stuck within the area. Fortunately there is no signs of active infection at this time. In fact she just has 1 small area of hyper granulation otherwise and other than that the wound is pretty much healed. Obviously this is good news but the patient is very concerned about the suture. She really does not have any major medical problems. This was a traumatic injury. She is status post bariatric surgery prior to that she  did have diabetes since then she is not had any issues. 09/09/2019 upon evaluation today patient actually appears to be completely healed. I do not see any evidence of infection nor does any evidence of an open wound at this time. She still has some edema and tells me that the area still giving her some trouble at this point but fortunately there is no evidence of active infection. No fevers, chills, nausea, vomiting, or diarrhea. Her main issue seems to be really the pain at this point she is feeling she is not sure she is in to be able to go back to work on Monday when school is supposed to start back in session in person. Electronic Signature(s) Signed: 09/09/2019 3:39:22 PM By: Dana Keeler PA-C Entered By: Dana Vasquez on 09/09/2019 15:39:22 Dana Vasquez, Dana Vasquez ([id number]) -------------------------------------------------------------------------------- Physical Exam Details Patient Name: Dana Vasquez Date of Service: 09/09/2019 3:00 PM Medical Record Number: [id number] Patient Account Number: [id number] Date of Birth/Sex: 01-10-1966 (53 y.o. F) Treating RN: Dana Vasquez Primary Care Provider: Glendale Vasquez Other Clinician: Referring Provider: Glendale Vasquez Treating Provider/Extender: STONE III, Dymond Vasquez Weeks in Treatment: 3 Constitutional Well-nourished and well-hydrated in no acute distress. Respiratory normal breathing without difficulty. clear to auscultation bilaterally. Cardiovascular regular rate and rhythm with normal S1, S2. Psychiatric this patient is able to make decisions and demonstrates good insight into disease process. Alert and Oriented x 3. pleasant and cooperative. Notes Patient's wound bed upon inspection today did not appear to show any signs of an open wound there were no spitting sutures noted at this time and overall she seems to be doing quite well in that regard. She is completely healed. With that being said she still has noted edema at this  time and again I think the best way to manage this will be with a compression sock although she  tells me last time she tried to wear this it was too painful. For that reason she may need to try an Ace wrap to see if that could be beneficial option for her. Electronic Signature(s) Signed: 09/09/2019 3:39:56 PM By: Dana Keeler PA-C Entered By: Dana Vasquez on 09/09/2019 15:39:55 Dana Vasquez, Dana Vasquez ([id number]) -------------------------------------------------------------------------------- Physician Orders Details Patient Name: Dana Vasquez Date of Service: 09/09/2019 3:00 PM Medical Record Number: [id number] Patient Account Number: [id number] Date of Birth/Sex: 1966-07-13 (53 y.o. F) Treating RN: Dana Vasquez Primary Care Provider: Glendale Vasquez Other Clinician: Referring Provider: Glendale Vasquez Treating Provider/Extender: Dana Vasquez, Ott Zimmerle Weeks in Treatment: 3 Verbal / Phone Orders: No Diagnosis Coding ICD-10 Coding Code Description T81.31XA Disruption of external operation (surgical) wound, not elsewhere classified, initial encounter L97.812 Non-pressure chronic ulcer of other part of right lower leg with fat layer exposed Z98.84 Bariatric surgery status Discharge From [facility] o Discharge from [place] Complete Electronic Signature(s) Signed: 09/09/2019 3:26:34 PM By: Dana Vasquez Signed: 09/09/2019 3:41:54 PM By: Dana Keeler PA-C Entered By: Dana Vasquez on 09/09/2019 15:23:05 Dana Vasquez Dana Vasquez ([id number]) -------------------------------------------------------------------------------- Problem List Details Patient Name: Dana Vasquez Date of Service: 09/09/2019 3:00 PM Medical Record Number: [id number] Patient Account Number: [id number] Date of Birth/Sex: 04/07/1966 (53 y.o. F) Treating RN: Dana Vasquez Primary Care Provider: Glendale Vasquez Other Clinician: Referring Provider: Glendale Vasquez Treating Provider/Extender:  Dana Vasquez, Jancy Sprankle Weeks in Treatment: 3 Active Problems ICD-10 Evaluated Encounter Code Description Active Date Today Diagnosis T81.31XA Disruption of external operation (surgical) wound, not 08/19/2019 No Yes elsewhere classified, initial encounter L97.812 Non-pressure chronic ulcer of other part of right lower leg 08/19/2019 No Yes with fat layer exposed Z98.84 Bariatric surgery status 08/19/2019 No Yes Inactive Problems Resolved Problems Electronic Signature(s) Signed: 09/09/2019 3:14:20 PM By: Dana Keeler PA-C Entered By: Dana Vasquez on 09/09/2019 15:14:20 Dana Vasquez, Dana Vasquez. ([id number]) -------------------------------------------------------------------------------- Progress Note Details Patient Name: Dana Vasquez Date of Service: 09/09/2019 3:00 PM Medical Record Number: [id number] Patient Account Number: [id number] Date of Birth/Sex: 1966-01-23 (53 y.o. F) Treating RN: Dana Vasquez Primary Care Provider: Glendale Vasquez Other Clinician: Referring Provider: Glendale Vasquez Treating Provider/Extender: Dana Vasquez, Armstead Heiland Weeks in Treatment: 3 Subjective Chief Complaint Information obtained from Patient Right leg surgical ulcer History of Present Illness (HPI) 08/19/2019 upon evaluation today patient presents for initial inspection here in our clinic concerning issues that she has been having with what appears to be a spitting suture coming out of her suture repair on the lower extremity. She is concerned however about the fact that there may be a gauze stuck within the area. Fortunately there is no signs of active infection at this time. In fact she just has 1 small area of hyper granulation otherwise and other than that the wound is pretty much healed. Obviously this is good news but the patient is very concerned about the suture. She really does not have any major medical problems. This was a traumatic injury. She is status post bariatric surgery prior to that she  did have diabetes since then she is not had any issues. 09/09/2019 upon evaluation today patient actually appears to be completely healed. I do not see any evidence of infection nor does any evidence of an open wound at this time. She still has some edema and tells me that the area still giving her some trouble at this point but fortunately there is no evidence of active infection. No fevers, chills, nausea, vomiting, or  diarrhea. Her main issue seems to be really the pain at this point she is feeling she is not sure she is in to be able to go back to work on Monday when school is supposed to start back in session in person. Patient History Information obtained from Patient. Family History Cancer - Mother,Father,Siblings, Hypertension - Mother. Medical History Hematologic/Lymphatic Patient has history of Anemia Respiratory Patient has history of Asthma, Sleep Apnea - C-PAP Musculoskeletal Patient has history of Osteoarthritis - Knees and shoulders Medical And Surgical History Notes Gastrointestinal GERD Musculoskeletal Fibromyalgia Neurologic Headaches Oncologic Thyroid Cancer 1995, surgery and radioactive iodine Review of Systems (ROS) Constitutional Symptoms (General Health) Dana Vasquez, Dana Vasquez. ([id number]) Denies complaints or symptoms of Fatigue, Fever, Chills, Marked Weight Change. Respiratory Denies complaints or symptoms of Chronic or frequent coughs, Shortness of Breath. Cardiovascular Denies complaints or symptoms of Chest pain, LE edema. Objective Constitutional Well-nourished and well-hydrated in no acute distress. Vitals Time Taken: 3:19 PM, Height: 64 in, Weight: 199 lbs, BMI: 34.2, Temperature: 98.1 F, Pulse: 66 bpm, Respiratory Rate: 16 breaths/min, Blood Pressure: 148/70 mmHg. Respiratory normal breathing without difficulty. clear to auscultation bilaterally. Cardiovascular regular rate and rhythm with normal S1, S2. Psychiatric this patient is able to  make decisions and demonstrates good insight into disease process. Alert and Oriented x 3. pleasant and cooperative. General Notes: Patient's wound bed upon inspection today did not appear to show any signs of an open wound there were no spitting sutures noted at this time and overall she seems to be doing quite well in that regard. She is completely healed. With that being said she still has noted edema at this time and again I think the best way to manage this will be with a compression sock although she tells me last time she tried to wear this it was too painful. For that reason she may need to try an Ace wrap to see if that could be beneficial option for her. Integumentary (Hair, Skin) Wound #1 status is Open. Original cause of wound was Trauma. The wound is located on the Right,Medial,Anterior Lower Leg. The wound measures 0cm length x 0cm width x 0cm depth; 0cm^2 area and 0cm^3 volume. The wound is limited to skin breakdown. There is no tunneling or undermining noted. There is a none present amount of drainage noted. The wound margin is flat and intact. There is no granulation within the wound bed. There is no necrotic tissue within the wound bed. Assessment Active Problems ICD-10 Disruption of external operation (surgical) wound, not elsewhere classified, initial encounter Non-pressure chronic ulcer of other part of right lower leg with fat layer exposed Bariatric surgery status Dana Vasquez, Dana Vasquez ([id number]) Plan Discharge From [facility] Services: Discharge from [facility] Complete 1. I would recommend currently we discontinue wound care services as the patient seems to be doing well. 2. I am also going to recommend initiation of an Ace wrap currently although I would really like for her to try a compression sock I think that would be the best way to go. Nonetheless if that is too painful that I would say stick with the Ace wrap for the time being. 3. As far as  whether or not she is able to go back to work I would defer this either to orthopedics or her primary care provider again from a wound standpoint I think she is fine to go back from a pain and swelling standpoint again that is a different story and I will  leave that to her primary care provider she sees them Monday morning. Follow-up as needed. Electronic Signature(s) Signed: 09/09/2019 3:40:47 PM By: Dana Keeler PA-C Entered By: Dana Vasquez on 09/09/2019 15:40:47 Dana Vasquez, Dana EMarland Kitchen ([id number]) -------------------------------------------------------------------------------- ROS/PFSH Details Patient Name: Dana Vasquez Date of Service: 09/09/2019 3:00 PM Medical Record Number: [id number] Patient Account Number: [id number] Date of Birth/Sex: 11/01/1965 (53 y.o. F) Treating RN: Dana Vasquez Primary Care Provider: Glendale Vasquez Other Clinician: Referring Provider: Glendale Vasquez Treating Provider/Extender: Dana Vasquez, Harlo Fabela Weeks in Treatment: 3 Information Obtained From Patient Constitutional Symptoms (General Health) Complaints and Symptoms: Negative for: Fatigue; Fever; Chills; Marked Weight Change Respiratory Complaints and Symptoms: Negative for: Chronic or frequent coughs; Shortness of Breath Medical History: Positive for: Asthma; Sleep Apnea - C-PAP Cardiovascular Complaints and Symptoms: Negative for: Chest pain; LE edema Hematologic/Lymphatic Medical History: Positive for: Anemia Gastrointestinal Medical History: Past Medical History Notes: GERD Musculoskeletal Medical History: Positive for: Osteoarthritis - Knees and shoulders Past Medical History Notes: Fibromyalgia Neurologic Medical History: Past Medical History Notes: Headaches Oncologic Medical History: Past Medical History Notes: Thyroid Cancer 1995, surgery and radioactive iodine Dana Vasquez, Dana Vasquez. ([id number]) Immunizations Pneumococcal Vaccine: Received Pneumococcal Vaccination:  No Implantable Devices None Family and Social History Cancer: Yes - Mother,Father,Siblings; Hypertension: Yes - Mother Physician Affirmation I have reviewed and agree with the above information. Electronic Signature(s) Signed: 09/09/2019 3:41:54 PM By: Dana Keeler PA-C Signed: 09/13/2019 4:22:40 PM By: Dana Vasquez Entered By: Dana Vasquez on 09/09/2019 15:39:37 TAY-Courtwright, Ann Vasquez. ([id number]) -------------------------------------------------------------------------------- SuperBill Details Patient Name: Dana Vasquez Date of Service: 09/09/2019 Medical Record Number: [id number] Patient Account Number: [id number] Date of Birth/Sex: 05-21-1966 (53 y.o. F) Treating RN: Dana Vasquez Primary Care Provider: Glendale Vasquez Other Clinician: Referring Provider: Glendale Vasquez Treating Provider/Extender: Dana Vasquez, Devina Bezold Weeks in Treatment: 3 Diagnosis Coding ICD-10 Codes Code Description T81.31XA Disruption of external operation (surgical) wound, not elsewhere classified, initial encounter L97.812 Non-pressure chronic ulcer of other part of right lower leg with fat layer exposed Z98.84 Bariatric surgery status Facility Procedures CPT4 Code: [id number] Description: 99213 - WOUND CARE VISIT-LEV 3 EST PT Modifier: Quantity: 1 Physician Procedures CPT4: Description Modifier Quantity Code [id number] 99213 - WC PHYS LEVEL 3 - EST PT 1 ICD-10 Diagnosis Description T81.31XA Disruption of external operation (surgical) wound, not elsewhere classified, initial encounter [id number] Non-pressure chronic ulcer of  other part of right lower leg with fat layer exposed Z98.84 Bariatric surgery status Electronic Signature(s) Signed: 09/09/2019 3:41:21 PM By: Dana Keeler PA-C Previous Signature: 09/09/2019 3:41:01 PM Version By: Dana Keeler PA-C Entered By: Dana Vasquez on 09/09/2019 15:41:21

## 2019-09-09 NOTE — Progress Notes (Addendum)
Dana Vasquez ([id number]) Visit Report for 09/09/2019 Arrival Information Details Patient Name: Dana Vasquez Date of Service: 09/09/2019 3:00 PM Medical Record Number: [id number] Patient Account Number: [id number] Date of Birth/Sex: [birth year] (53 y.o. F) Treating RN: Dana Vasquez Primary Care Dana Vasquez: Glendale Chard Other Clinician: Referring Dana Vasquez: Glendale Chard Treating Dana Vasquez/Extender: Melburn Hake, Dana Vasquez Dana Vasquez: 3 Visit Information History Since Last Visit Added or deleted any medications: No Patient Arrived: Ambulatory Any new allergies or adverse reactions: No Arrival Time: 15:14 Had a fall or experienced change in No Accompanied By: self activities of daily living that may affect Transfer Assistance: None risk of falls: Patient Identification Verified: Yes Signs or symptoms of abuse/neglect since last visito No Secondary Verification Process Completed: Yes Hospitalized since last visit: No Implantable device outside of the clinic excluding No cellular tissue based products placed in the center since last visit: Has Dressing in Place as Prescribed: No Pain Present Now: No Electronic Signature(s) Signed: 09/09/2019 3:25:30 PM By: Dana Vasquez Entered By: Dana Vasquez on 09/09/2019 15:14:52 TAY-Gadomski, Dana Vasquez ([id number]) -------------------------------------------------------------------------------- Clinic Level of Care Assessment Details Patient Name: Dana Vasquez Date of Service: 09/09/2019 3:00 PM Medical Record Number: [id number] Patient Account Number: [id number] Date of Birth/Sex: 06/28/1966 (53 y.o. F) Treating RN: Army Melia Primary Care Dana Vasquez: Glendale Chard Other Clinician: Referring Dana Vasquez: Glendale Chard Treating Perkins Molina/Extender: Melburn Hake, Dana Vasquez Dana Vasquez: 3 Clinic Level of Care Assessment Items TOOL 4 Quantity Score []  - Use when only an EandM is performed on FOLLOW-UP visit 0 ASSESSMENTS -  Nursing Assessment / Reassessment X - Reassessment of Co-morbidities (includes updates in patient status) 1 10 X- 1 5 Reassessment of Adherence to Vasquez Plan ASSESSMENTS - Wound and Skin Assessment / Reassessment X - Simple Wound Assessment / Reassessment - one wound 1 5 []  - 0 Complex Wound Assessment / Reassessment - multiple wounds []  - 0 Dermatologic / Skin Assessment (not related to wound area) ASSESSMENTS - Focused Assessment []  - Circumferential Edema Measurements - multi extremities 0 []  - 0 Nutritional Assessment / Counseling / Intervention X- 1 5 Lower Extremity Assessment (monofilament, tuning fork, pulses) []  - 0 Peripheral Arterial Disease Assessment (using hand held doppler) ASSESSMENTS - Ostomy and/or Continence Assessment and Care []  - Incontinence Assessment and Management 0 []  - 0 Ostomy Care Assessment and Management (repouching, etc.) PROCESS - Coordination of Care X - Simple Patient / Family Education for ongoing care 1 15 []  - 0 Complex (extensive) Patient / Family Education for ongoing care X- 1 10 Staff obtains Programmer, systems, Records, Test Results / Process Orders []  - 0 Staff telephones HHA, Nursing Homes / Clarify orders / etc []  - 0 Routine Transfer to another Facility (non-emergent condition) []  - 0 Routine Hospital Admission (non-emergent condition) []  - 0 New Admissions / Biomedical engineer / Ordering NPWT, Apligraf, etc. []  - 0 Emergency Hospital Admission (emergent condition) X- 1 10 Simple Discharge Coordination TAY-Wermuth, Dana E. ([id number]) []  - 0 Complex (extensive) Discharge Coordination PROCESS - Special Needs []  - Pediatric / Minor Patient Management 0 []  - 0 Isolation Patient Management []  - 0 Hearing / Language / Visual special needs []  - 0 Assessment of Community assistance (transportation, D/C planning, etc.) []  - 0 Additional assistance / Altered mentation []  - 0 Support Surface(s) Assessment (bed, cushion, seat,  etc.) INTERVENTIONS - Wound Cleansing / Measurement X - Simple Wound Cleansing - one wound 1 5 []  - 0 Complex Wound Cleansing - multiple wounds X- 1 5 Wound Imaging (  photographs - any number of wounds) []  - 0 Wound Tracing (instead of photographs) X- 1 5 Simple Wound Measurement - one wound []  - 0 Complex Wound Measurement - multiple wounds INTERVENTIONS - Wound Dressings []  - Small Wound Dressing one or multiple wounds 0 []  - 0 Medium Wound Dressing one or multiple wounds []  - 0 Large Wound Dressing one or multiple wounds []  - 0 Application of Medications - topical []  - 0 Application of Medications - injection INTERVENTIONS - Miscellaneous []  - External ear exam 0 []  - 0 Specimen Collection (cultures, biopsies, blood, body fluids, etc.) []  - 0 Specimen(s) / Culture(s) sent or taken to Lab for analysis []  - 0 Patient Transfer (multiple staff / Civil Service fast streamer / Similar devices) []  - 0 Simple Staple / Suture removal (25 or less) []  - 0 Complex Staple / Suture removal (26 or more) []  - 0 Hypo / Hyperglycemic Management (close monitor of Blood Glucose) []  - 0 Ankle / Brachial Index (ABI) - do not check if billed separately X- 1 5 Vital Signs TAY-Bobby, Dana E. ([id number]) Has the patient been seen at the hospital within the last three years: Yes Total Score: 80 Level Of Care: New/Established - Level 3 Electronic Signature(s) Signed: 09/09/2019 3:26:34 PM By: Army Melia Entered By: Army Melia on 09/09/2019 15:25:36 TAY-Gruel, Dana Vasquez ([id number]) -------------------------------------------------------------------------------- Encounter Discharge Information Details Patient Name: Dana Vasquez Date of Service: 09/09/2019 3:00 PM Medical Record Number: [id number] Patient Account Number: [id number] Date of Birth/Sex: [birth year] (53 y.o. F) Treating RN: Army Melia Primary Care Dana Vasquez: Glendale Chard Other Clinician: Referring Dana Vasquez: Glendale Chard Treating Dana Vasquez/Extender: Melburn Hake, Dana Vasquez Dana Vasquez: 3 Encounter Discharge Information Items Discharge Condition: Stable Ambulatory Status: Ambulatory Discharge Destination: Home Transportation: Private Auto Accompanied By: self Schedule Follow-up Appointment: Yes Clinical Summary of Care: Electronic Signature(s) Signed: 09/09/2019 3:26:34 PM By: Army Melia Entered By: Army Melia on 09/09/2019 15:26:16 TAY-Pietila, Chaniya [name] ([id number]) -------------------------------------------------------------------------------- Lower Extremity Assessment Details Patient Name: Dana Vasquez Date of Service: 09/09/2019 3:00 PM Medical Record Number: [id number] Patient Account Number: [id number] Date of Birth/Sex: [birth year] (53 y.o. F) Treating RN: Dana Vasquez Primary Care Onie Kasparek: Glendale Chard Other Clinician: Referring Equilla Que: Glendale Chard Treating Sanah Kraska/Extender: STONE III, Dana Vasquez Dana Vasquez: 3 Edema Assessment Assessed: [Left: No] [Right: No] Edema: [Left: Ye] [Right: s] Vascular Assessment Pulses: Dorsalis Pedis Palpable: [Right:Yes] Electronic Signature(s) Signed: 09/09/2019 3:25:30 PM By: Dana Vasquez Entered By: Dana Vasquez on 09/09/2019 15:16:55 TAY-Frerichs, Krystan EMarland Kitchen ([id number]) -------------------------------------------------------------------------------- Multi Wound Chart Details Patient Name: Dana Vasquez Date of Service: 09/09/2019 3:00 PM Medical Record Number: [id number] Patient Account Number: [id number] Date of Birth/Sex: 06-Aug-1966 (53 y.o. F) Treating RN: Army Melia Primary Care Janette Harvie: Glendale Chard Other Clinician: Referring Florean Hoobler: Glendale Chard Treating Diago Haik/Extender: Melburn Hake, Dana Vasquez Dana Vasquez: 3 Vital Signs Height(in): 64 Pulse(bpm): 66 Weight(lbs): 199 Blood Pressure(mmHg): 148/70 Body Mass Index(BMI): 34 Temperature(F): 98.1 Respiratory Rate 16 (breaths/min): Photos:  [N/A:N/A] Wound Location: Right Lower Leg - Medial, N/A N/A Anterior Wounding Event: Trauma N/A N/A Primary Etiology: Trauma, Other N/A N/A Comorbid History: Anemia, Asthma, Sleep N/A N/A Apnea, Osteoarthritis Date Acquired: 07/02/2019 N/A N/A Dana of Vasquez: 3 N/A N/A Wound Status: Open N/A N/A Measurements L x W x D 0x0x0 N/A N/A (cm) Area (cm) : 0 N/A N/A Volume (cm) : 0 N/A N/A % Reduction in Area: 100.00% N/A N/A % Reduction in Volume: 100.00% N/A N/A Classification: Partial Thickness N/A N/A Exudate Amount: None Present N/A N/A Wound  Margin: Flat and Intact N/A N/A Granulation Amount: None Present (0%) N/A N/A Necrotic Amount: None Present (0%) N/A N/A Exposed Structures: Fascia: No N/A N/A Fat Layer (Subcutaneous Tissue) Exposed: No Tendon: No Muscle: No Joint: No Bone: No Limited to Skin Breakdown Epithelialization: Large (67-100%) N/A N/A JEFF, CHESSOR ([id number]) Vasquez Notes Electronic Signature(s) Signed: 09/09/2019 3:26:34 PM By: Army Melia Entered By: Army Melia on 09/09/2019 15:22:44 TAY-Duby, Kern [place] ([id number]) -------------------------------------------------------------------------------- [facility] Details Patient Name: Dana Vasquez Date of Service: 09/09/2019 3:00 PM Medical Record Number: [id number] Patient Account Number: [id number] Date of Birth/Sex: 1965-11-23 (53 y.o. F) Treating RN: Army Melia Primary Care Brogan England: Glendale Chard Other Clinician: Referring Aalivia Mcgraw: Glendale Chard Treating Marzella Miracle/Extender: Melburn Hake, Dana Vasquez Dana Vasquez: 3 Active Inactive Electronic Signature(s) Signed: 09/09/2019 3:26:34 PM By: Army Melia Entered By: Army Melia on 09/09/2019 15:22:38 TAY-Takach, Kern Alberta ([id number]) -------------------------------------------------------------------------------- Pain Assessment Details Patient Name: Dana Vasquez Date of Service: 09/09/2019 3:00  PM Medical Record Number: [id number] Patient Account Number: [id number] Date of Birth/Sex: 04/11/1966 (53 y.o. F) Treating RN: Dana Vasquez Primary Care Ulysses Alper: Glendale Chard Other Clinician: Referring Laria Grimmett: Glendale Chard Treating Tayshun Gappa/Extender: Melburn Hake, Dana Vasquez Dana Vasquez: 3 Active Problems Location of Pain Severity and Description of Pain Patient Has Paino No Site Locations Pain Management and Medication Current Pain Management: Electronic Signature(s) Signed: 09/09/2019 3:25:30 PM By: Dana Vasquez Entered By: Dana Vasquez on 09/09/2019 15:15:50 TAY-Porr, [place] [name] ([id number]) -------------------------------------------------------------------------------- Patient/Caregiver Education Details Patient Name: Dana Vasquez Date of Service: 09/09/2019 3:00 PM Medical Record Number: [id number] Patient Account Number: [id number] Date of Birth/Gender: [birth year] (53 y.o. F) Treating RN: Army Melia Primary Care Physician: Glendale Chard Other Clinician: Referring Physician: Glendale Chard Treating Physician/Extender: Sharalyn Ink in Vasquez: 3 Education Assessment Education Provided To: Patient Education Topics Provided Wound/Skin Impairment: Handouts: Caring for Your Ulcer Methods: Demonstration, Explain/Verbal Responses: State content correctly Electronic Signature(s) Signed: 09/09/2019 3:26:34 PM By: Army Melia Entered By: Army Melia on 09/09/2019 15:26:00 TAY-Pasquariello, Ieshia [name] ([id number]) -------------------------------------------------------------------------------- Wound Assessment Details Patient Name: Dana Vasquez Date of Service: 09/09/2019 3:00 PM Medical Record Number: [id number] Patient Account Number: [id number] Date of Birth/Sex: 04/28/1966 (53 y.o. F) Treating RN: Dana Vasquez Primary Care Fleur Audino: Glendale Chard Other Clinician: Referring Annitta Fifield: Glendale Chard Treating Zaydrian Batta/Extender: Melburn Hake,  Dana Vasquez Dana Vasquez: 3 Wound Status Wound Number: 1 Primary Etiology: Trauma, Other Wound Location: Right Lower Leg - Medial, Anterior Wound Status: Open Wounding Event: Trauma Comorbid Anemia, Asthma, Sleep Apnea, History: Osteoarthritis Date Acquired: 07/02/2019 Dana Of Vasquez: 3 Clustered Wound: No Photos Wound Measurements Length: (cm) Width: (cm) Depth: (cm) Area: (cm) Volume: (cm) 0 % Reduction in Area: 100% 0 % Reduction in Volume: 100% 0 Epithelialization: Large (67-100%) 0 Tunneling: No 0 Undermining: No Wound Description Classification: Partial Thickness Wound Margin: Flat and Intact Exudate Amount: None Present Foul Odor After Cleansing: No Slough/Fibrino No Wound Bed Granulation Amount: None Present (0%) Exposed Structure Necrotic Amount: None Present (0%) Fascia Exposed: No Fat Layer (Subcutaneous Tissue) Exposed: No Tendon Exposed: No Muscle Exposed: No Joint Exposed: No Bone Exposed: No Limited to Skin Breakdown Electronic Signature(s) Signed: 09/09/2019 3:25:30 PM By: Roselind Messier, Aundria EMarland Kitchen ([id number]) Entered By: Dana Vasquez on 09/09/2019 15:19:31 TAY-Forbis, Verline [name] ([id number]) -------------------------------------------------------------------------------- Vitals Details Patient Name: Dana Vasquez Date of Service: 09/09/2019 3:00 PM Medical Record Number: [id number] Patient Account Number: [id number] Date of Birth/Sex: [birth year] (53 y.o. F) Treating RN: Dana Vasquez Primary Care Uldine Fuster: Glendale Chard Other  Clinician: Referring Elvan Ebron: Glendale Chard Treating Lindsi Bayliss/Extender: Melburn Hake, Dana Vasquez Dana Vasquez: 3 Vital Signs Time Taken: 15:19 Temperature (F): 98.1 Height (in): 64 Pulse (bpm): 66 Weight (lbs): 199 Respiratory Rate (breaths/min): 16 Body Mass Index (BMI): 34.2 Blood Pressure (mmHg): 148/70 Reference Range: 80 - 120 mg / dl Electronic Signature(s) Signed: 09/09/2019 3:25:30 PM  By: Dana Vasquez Entered By: Dana Vasquez on 09/09/2019 15:20:02

## 2019-09-13 ENCOUNTER — Ambulatory Visit: Payer: BC Managed Care – PPO | Admitting: Internal Medicine

## 2019-09-13 ENCOUNTER — Encounter: Payer: Self-pay | Admitting: Internal Medicine

## 2019-09-13 ENCOUNTER — Other Ambulatory Visit: Payer: Self-pay

## 2019-09-13 VITALS — BP 112/70 | HR 75 | Temp 98.0°F | Ht 64.0 in | Wt 199.6 lb

## 2019-09-13 DIAGNOSIS — D573 Sickle-cell trait: Secondary | ICD-10-CM | POA: Diagnosis not present

## 2019-09-13 DIAGNOSIS — I10 Essential (primary) hypertension: Secondary | ICD-10-CM

## 2019-09-13 DIAGNOSIS — M79671 Pain in right foot: Secondary | ICD-10-CM | POA: Diagnosis not present

## 2019-09-13 DIAGNOSIS — Z0000 Encounter for general adult medical examination without abnormal findings: Secondary | ICD-10-CM

## 2019-09-13 DIAGNOSIS — Z23 Encounter for immunization: Secondary | ICD-10-CM | POA: Diagnosis not present

## 2019-09-13 LAB — POCT URINALYSIS DIPSTICK
Bilirubin, UA: NEGATIVE
Glucose, UA: NEGATIVE
Ketones, UA: NEGATIVE
Leukocytes, UA: NEGATIVE
Nitrite, UA: NEGATIVE
Protein, UA: NEGATIVE
Spec Grav, UA: 1.025 (ref 1.010–1.025)
Urobilinogen, UA: 0.2 E.U./dL
pH, UA: 6 (ref 5.0–8.0)

## 2019-09-13 LAB — POCT UA - MICROALBUMIN
Albumin/Creatinine Ratio, Urine, POC: 30
Creatinine, POC: 100 mg/dL
Microalbumin Ur, POC: 10 mg/L

## 2019-09-13 NOTE — Patient Instructions (Signed)
Arnica gel - rub on right leg/ankle in pm Drink turmeric tea Voltaren gel or Aspercreme in am  Please email me in a week or two to let me know how you are progressing.    Health Maintenance, Female Adopting a healthy lifestyle and getting preventive care are important in promoting health and wellness. Ask your health care provider about:  The right schedule for you to have regular tests and exams.  Things you can do on your own to prevent diseases and keep yourself healthy. What should I know about diet, weight, and exercise? Eat a healthy diet   Eat a diet that includes plenty of vegetables, fruits, low-fat dairy products, and lean protein.  Do not eat a lot of foods that are high in solid fats, added sugars, or sodium. Maintain a healthy weight Body mass index (BMI) is used to identify weight problems. It estimates body fat based on height and weight. Your health care provider can help determine your BMI and help you achieve or maintain a healthy weight. Get regular exercise Get regular exercise. This is one of the most important things you can do for your health. Most adults should:  Exercise for at least 150 minutes each week. The exercise should increase your heart rate and make you sweat (moderate-intensity exercise).  Do strengthening exercises at least twice a week. This is in addition to the moderate-intensity exercise.  Spend less time sitting. Even light physical activity can be beneficial. Watch cholesterol and blood lipids Have your blood tested for lipids and cholesterol at 52 years of age, then have this test every 5 years. Have your cholesterol levels checked more often if:  Your lipid or cholesterol levels are high.  You are older than 52 years of age.  You are at high risk for heart disease. What should I know about cancer screening? Depending on your health history and family history, you may need to have cancer screening at various ages. This may include  screening for:  Breast cancer.  Cervical cancer.  Colorectal cancer.  Skin cancer.  Lung cancer. What should I know about heart disease, diabetes, and high blood pressure? Blood pressure and heart disease  High blood pressure causes heart disease and increases the risk of stroke. This is more likely to develop in people who have high blood pressure readings, are of African descent, or are overweight.  Have your blood pressure checked: ? Every 3-5 years if you are 85-44 years of age. ? Every year if you are 26 years old or older. Diabetes Have regular diabetes screenings. This checks your fasting blood sugar level. Have the screening done:  Once every three years after age 78 if you are at a normal weight and have a low risk for diabetes.  More often and at a younger age if you are overweight or have a high risk for diabetes. What should I know about preventing infection? Hepatitis B If you have a higher risk for hepatitis B, you should be screened for this virus. Talk with your health care provider to find out if you are at risk for hepatitis B infection. Hepatitis C Testing is recommended for:  Everyone born from [birth year] through 1964.  Anyone with known risk factors for hepatitis C. Sexually transmitted infections (STIs)  Get screened for STIs, including gonorrhea and chlamydia, if: ? You are sexually active and are younger than 52 years of age. ? You are older than 52 years of age and your health care provider tells  you that you are at risk for this type of infection. ? Your sexual activity has changed since you were last screened, and you are at increased risk for chlamydia or gonorrhea. Ask your health care provider if you are at risk.  Ask your health care provider about whether you are at high risk for HIV. Your health care provider may recommend a prescription medicine to help prevent HIV infection. If you choose to take medicine to prevent HIV, you should first get  tested for HIV. You should then be tested every 3 months for as long as you are taking the medicine. Pregnancy  If you are about to stop having your period (premenopausal) and you may become pregnant, seek counseling before you get pregnant.  Take 400 to 800 micrograms (mcg) of folic acid every day if you become pregnant.  Ask for birth control (contraception) if you want to prevent pregnancy. Osteoporosis and menopause Osteoporosis is a disease in which the bones lose minerals and strength with aging. This can result in bone fractures. If you are 40 years old or older, or if you are at risk for osteoporosis and fractures, ask your health care provider if you should:  Be screened for bone loss.  Take a calcium or vitamin D supplement to lower your risk of fractures.  Be given hormone replacement therapy (HRT) to treat symptoms of menopause. Follow these instructions at home: Lifestyle  Do not use any products that contain nicotine or tobacco, such as cigarettes, e-cigarettes, and chewing tobacco. If you need help quitting, ask your health care provider.  Do not use street drugs.  Do not share needles.  Ask your health care provider for help if you need support or information about quitting drugs. Alcohol use  Do not drink alcohol if: ? Your health care provider tells you not to drink. ? You are pregnant, may be pregnant, or are planning to become pregnant.  If you drink alcohol: ? Limit how much you use to 0-1 drink a day. ? Limit intake if you are breastfeeding.  Be aware of how much alcohol is in your drink. In the [place] one drink equals one 12 oz bottle of beer (355 mL), one 5 oz glass of wine (148 mL), or one 1 oz glass of hard liquor (44 mL). General instructions  Schedule regular health, dental, and eye exams.  Stay current with your vaccines.  Tell your health care provider if: ? You often feel depressed. ? You have ever been abused or do not feel safe at home.  Summary  Adopting a healthy lifestyle and getting preventive care are important in promoting health and wellness.  Follow your health care provider's instructions about healthy diet, exercising, and getting tested or screened for diseases.  Follow your health care provider's instructions on monitoring your cholesterol and blood pressure. This information is not intended to replace advice given to you by your health care provider. Make sure you discuss any questions you have with your health care provider. Document Released: 04/29/2011 Document Revised: 10/07/2018 Document Reviewed: 10/07/2018 Elsevier Patient Education  2020 [employer].

## 2019-09-14 ENCOUNTER — Encounter: Payer: Self-pay | Admitting: Internal Medicine

## 2019-09-14 LAB — CBC
Hematocrit: 39.4 % (ref 34.0–46.6)
Hemoglobin: 12.8 g/dL (ref 11.1–15.9)
MCH: 26.8 pg (ref 26.6–33.0)
MCHC: 32.5 g/dL (ref 31.5–35.7)
MCV: 82 fL (ref 79–97)
Platelets: 322 10*3/uL (ref 150–450)
RBC: 4.78 x10E6/uL (ref 3.77–5.28)
RDW: 13.5 % (ref 11.7–15.4)
WBC: 8.6 10*3/uL (ref 3.4–10.8)

## 2019-09-14 LAB — CMP14+EGFR
ALT: 12 IU/L (ref 0–32)
AST: 14 IU/L (ref 0–40)
Albumin/Globulin Ratio: 1.3 (ref 1.2–2.2)
Albumin: 4.4 g/dL (ref 3.8–4.9)
Alkaline Phosphatase: 99 IU/L (ref 39–117)
BUN/Creatinine Ratio: 20 (ref 9–23)
BUN: 21 mg/dL (ref 6–24)
Bilirubin Total: 0.3 mg/dL (ref 0.0–1.2)
CO2: 25 mmol/L (ref 20–29)
Calcium: 9.5 mg/dL (ref 8.7–10.2)
Chloride: 106 mmol/L (ref 96–106)
Creatinine, Ser: 1.03 mg/dL — ABNORMAL HIGH (ref 0.57–1.00)
GFR calc Af Amer: 72 mL/min/{1.73_m2} (ref >59)
GFR calc non Af Amer: 63 mL/min/{1.73_m2} (ref >59)
Globulin, Total: 3.3 g/dL (ref 1.5–4.5)
Glucose: 106 mg/dL — ABNORMAL HIGH (ref 65–99)
Potassium: 4.1 mmol/L (ref 3.5–5.2)
Sodium: 144 mmol/L (ref 134–144)
Total Protein: 7.7 g/dL (ref 6.0–8.5)

## 2019-09-14 LAB — HEMOGLOBIN A1C
Est. average glucose Bld gHb Est-mCnc: 108 mg/dL
Hgb A1c MFr Bld: 5.4 % (ref 4.8–5.6)

## 2019-09-14 LAB — LIPID PANEL
Chol/HDL Ratio: 2.6 ratio (ref 0.0–4.4)
Cholesterol, Total: 182 mg/dL (ref 100–199)
HDL: 70 mg/dL (ref >39)
LDL Chol Calc (NIH): 99 mg/dL (ref 0–99)
Triglycerides: 67 mg/dL (ref 0–149)
VLDL Cholesterol Cal: 13 mg/dL (ref 5–40)

## 2019-09-14 LAB — T4, FREE: Free T4: 1.41 ng/dL (ref 0.82–1.77)

## 2019-09-14 LAB — TSH: TSH: 1.55 u[IU]/mL (ref 0.450–4.500)

## 2019-09-15 ENCOUNTER — Encounter: Payer: Self-pay | Admitting: Internal Medicine

## 2019-09-19 NOTE — Progress Notes (Signed)
Subjective:     Patient ID: Dana Vasquez , female    DOB: 1966/06/21 , 53 y.o.   MRN: [id number]   Chief Complaint  Patient presents with  . Annual Exam  . Hypertension    HPI  She is here today for a full physical examination. She is followed by GYN for her pelvic exams.   Hypertension This is a chronic problem. The current episode started more than 1 year ago. The problem is controlled. Pertinent negatives include no blurred vision, chest pain, palpitations or shortness of breath. Risk factors for coronary artery disease include obesity, post-menopausal state and sedentary lifestyle. Past treatments include lifestyle changes. The current treatment provides moderate improvement. Compliance problems include exercise.      Past Medical History:  Diagnosis Date  . Allergy   . Anemia   . Arthritis    knees  AND SHOULDERS  . Asthma    allergy related - rarely uses inhaler  . Bruises easily   . Bunion   . Cancer (HCC)    1995;thyroid cancer, SURGERY AND RADIOACTIVE IODINE  . Ectopic pregnancy   . Euthyroid   . Fatigue   . Fibromyalgia   . GERD (gastroesophageal reflux disease)   . Headache(784.0)    otc med prn - last one 02/2014  . History of blood transfusion 2000   In [place], [place] at [facility] - ? 4 units transfused  . Hypothyroidism   . Knee pain   . Plantar fasciitis   . Sleep apnea    uses CPAP  . SVD (spontaneous vaginal delivery)    x 3  . Thyroid disease    thyroidectomy 1995     Family History  Problem Relation Age of Onset  . Hypertension Mother   . Cancer Mother        uterine  . Cancer Father   . Cancer Brother        stomach; 3  . Early death Brother   . Stroke Maternal Uncle   . Heart disease Maternal Uncle   . Food Allergy Daughter   . Allergic rhinitis Daughter   . Allergic rhinitis Daughter   . Angioedema Neg Hx   . Asthma Neg Hx   . Eczema Neg Hx   . Immunodeficiency Neg Hx   . Urticaria Neg Hx      Current Outpatient  Medications:  .  atorvastatin (LIPITOR) 10 MG tablet, Take 10 mg by mouth daily., Disp: , Rfl:  .  levothyroxine (SYNTHROID) 112 MCG tablet, Take 1 tablet (112 mcg total) by mouth daily., Disp: 30 tablet, Rfl: 1 .  Multiple Vitamins-Minerals (BARIATRIC MULTIVITAMINS/IRON) CAPS, Take 1 capsule by mouth daily. , Disp: , Rfl:  .  Probiotic Product (PROBIOTIC PO), Take by mouth., Disp: , Rfl:    Allergies  Allergen Reactions  . Amoxicillin Other (See Comments)    Headache Has patient had a PCN reaction causing immediate rash, facial/tongue/throat swelling, SOB or lightheadedness with hypotension: No Has patient had a PCN reaction causing severe rash involving mucus membranes or skin necrosis: No Has patient had a PCN reaction that required hospitalization: No Has patient had a PCN reaction occurring within the last 10 years: Unknown If all of the above answers are "NO", then may proceed with Cephalosporin use.   . Nitrofurantoin Monohyd Macro Other (See Comments)    Pt stated she gets a bad yeast infection  . Dexilant [Dexlansoprazole]     "bad stomachache"  . Meloxicam  Upset stomach and stomach pains  . Budesonide Rash  . Formoterol Fumarate Rash     Review of Systems  Constitutional: Negative.   HENT: Negative.   Eyes: Negative.  Negative for blurred vision.  Respiratory: Negative.  Negative for shortness of breath.   Cardiovascular: Negative.  Negative for chest pain and palpitations.  Endocrine: Negative.   Genitourinary: Negative.   Musculoskeletal: Negative.        She has not been able to exercise due to right foot pain. She cut herself with putty knife about a month ago. It has taken a long time to heal. There is pain with ambulation still. She is concerned b/c she is due to return to work - she works in school system. She is not sure that she can handle standing on her feet all day. She has been advised by [facility] to keep foot elevated as much as possible.    Skin: Negative.   Allergic/Immunologic: Negative.   Neurological: Negative.   Hematological: Negative.   Psychiatric/Behavioral: Negative.      Today's Vitals   09/13/19 0846  BP: 112/70  Pulse: 75  Temp: 98 F (36.7 C)  TempSrc: Oral  Weight: 199 lb 9.6 oz (90.5 kg)  Height: _0  (1.626 m)   Body mass index is 34.26 kg/m.   Objective:  Physical Exam Vitals signs and nursing note reviewed.  Constitutional:      Appearance: Normal appearance. She is obese.  HENT:     Head: Normocephalic and atraumatic.     Right Ear: Tympanic membrane, ear canal and external ear normal.     Left Ear: Tympanic membrane, ear canal and external ear normal.     Nose: Nose normal.     Mouth/Throat:     Mouth: Mucous membranes are moist.     Pharynx: Oropharynx is clear.  Eyes:     Extraocular Movements: Extraocular movements intact.     Conjunctiva/sclera: Conjunctivae normal.     Pupils: Pupils are equal, round, and reactive to light.  Neck:     Musculoskeletal: Normal range of motion and neck supple.  Cardiovascular:     Rate and Rhythm: Normal rate and regular rhythm.     Pulses:          Dorsalis pedis pulses are 1+ on the right side and 2+ on the left side.     Heart sounds: Normal heart sounds.  Pulmonary:     Effort: Pulmonary effort is normal.     Breath sounds: Normal breath sounds.  Chest:     Breasts: Tanner Score is 5.        Right: Normal.        Left: Normal.  Abdominal:     General: Abdomen is flat. Bowel sounds are normal.     Palpations: Abdomen is soft.  Genitourinary:    Comments: deferred Musculoskeletal: Normal range of motion.  Feet:     Right foot:     Protective Sensation: 5 sites tested. 5 sites sensed.     Toenail Condition: Right toenails are normal.     Left foot:     Protective Sensation: 5 sites tested. 5 sites sensed.     Skin integrity: Skin integrity normal.     Toenail Condition: Left toenails are normal.     Comments: r ankle with  hyperpigmented, healed scar, no drainage Skin:    General: Skin is warm and dry.  Neurological:     General: No focal deficit  present.     Mental Status: She is alert and oriented to person, place, and time.  Psychiatric:        Mood and Affect: Mood normal.        Behavior: Behavior normal.         Assessment And Plan:     1. Routine general medical examination at health care facility  A full exam was performed.  Importance of monthly self breast exams was discussed with the patient. PATIENT HAS BEEN ADVISED TO GET 30-45 MINUTES REGULAR EXERCISE NO LESS THAN FOUR TO FIVE DAYS PER WEEK - BOTH WEIGHTBEARING EXERCISES AND AEROBIC ARE RECOMMENDED.  SHE WAS ADVISED TO FOLLOW A HEALTHY DIET WITH AT LEAST SIX FRUITS/VEGGIES PER DAY, DECREASE INTAKE OF RED MEAT, AND TO INCREASE FISH INTAKE TO TWO DAYS PER WEEK.  MEATS/FISH SHOULD NOT BE FRIED, BAKED OR BROILED IS PREFERABLE.  I SUGGEST WEARING SPF 50 SUNSCREEN ON EXPOSED PARTS AND ESPECIALLY WHEN IN THE DIRECT SUNLIGHT FOR AN EXTENDED PERIOD OF TIME.  PLEASE AVOID FAST FOOD RESTAURANTS AND INCREASE YOUR WATER INTAKE.  - CMP14+EGFR - CBC - Lipid panel - TSH - T4, Free - Hemoglobin A1c - POCT Urinalysis Dipstick (81002) - POCT UA - Microalbumin  2. Essential hypertension, benign  Chronic, well controlled. She is now off of medication. She was able to come off of medication after having bariatric surgery. She is encouraged to avoid adding salt to her foods.   - POCT Urinalysis Dipstick (81002) - POCT UA - Microalbumin  3. Right foot pain  Persistent. She is advised to elevate leg when seated. She is advised to apply arnica gel to right ankle daily at night. She will use Voltaren gel daily in am.   4. Sickle cell trait (HCC)  Chronic, yet stable. She is encouraged to stay well hydrated.   5. Flu vaccine need  She was given flu vaccine as requested.    [name], MD    THE PATIENT IS ENCOURAGED TO PRACTICE SOCIAL  DISTANCING DUE TO THE COVID-19 PANDEMIC.

## 2019-09-20 ENCOUNTER — Encounter: Payer: Self-pay | Admitting: Internal Medicine

## 2019-09-27 LAB — HM DIABETES EYE EXAM

## 2019-09-30 ENCOUNTER — Encounter: Payer: Self-pay | Admitting: Internal Medicine

## 2019-10-05 ENCOUNTER — Ambulatory Visit: Payer: BC Managed Care – PPO | Admitting: Podiatry

## 2019-10-10 ENCOUNTER — Encounter: Payer: Self-pay | Admitting: Internal Medicine

## 2019-10-13 ENCOUNTER — Telehealth (INDEPENDENT_AMBULATORY_CARE_PROVIDER_SITE_OTHER): Payer: BC Managed Care – PPO | Admitting: Internal Medicine

## 2019-10-13 ENCOUNTER — Encounter: Payer: Self-pay | Admitting: Internal Medicine

## 2019-10-13 ENCOUNTER — Other Ambulatory Visit: Payer: Self-pay

## 2019-10-13 VITALS — Temp 99.7°F | Wt 199.0 lb

## 2019-10-13 DIAGNOSIS — U071 COVID-19: Secondary | ICD-10-CM | POA: Diagnosis not present

## 2019-10-13 DIAGNOSIS — J40 Bronchitis, not specified as acute or chronic: Secondary | ICD-10-CM | POA: Diagnosis not present

## 2019-10-13 MED ORDER — AZITHROMYCIN 250 MG PO TABS: ORAL_TABLET | ORAL | 0 refills | Status: AC

## 2019-10-13 NOTE — Patient Instructions (Signed)
Oscillococcinum - homeopathic remedy for viral illnesses  COVID-19 COVID-19 is a respiratory infection that is caused by a virus called severe acute respiratory syndrome coronavirus 2 (SARS-CoV-2). The disease is also known as coronavirus disease or novel coronavirus. In some people, the virus may not cause any symptoms. In others, it may cause a serious infection. The infection can get worse quickly and can lead to complications, such as:  Pneumonia, or infection of the lungs.  Acute respiratory distress syndrome or ARDS. This is fluid build-up in the lungs.  Acute respiratory failure. This is a condition in which there is not enough oxygen passing from the lungs to the body.  Sepsis or septic shock. This is a serious bodily reaction to an infection.  Blood clotting problems.  Secondary infections due to bacteria or fungus. The virus that causes COVID-19 is contagious. This means that it can spread from person to person through droplets from coughs and sneezes (respiratory secretions). What are the causes? This illness is caused by a virus. You may catch the virus by:  Breathing in droplets from an infected person's cough or sneeze.  Touching something, like a table or a doorknob, that was exposed to the virus (contaminated) and then touching your mouth, nose, or eyes. What increases the risk? Risk for infection You are more likely to be infected with this virus if you:  Live in or travel to an area with a COVID-19 outbreak.  Come in contact with a sick person who recently traveled to an area with a COVID-19 outbreak.  Provide care for or live with a person who is infected with COVID-19. Risk for serious illness You are more likely to become seriously ill from the virus if you:  Are 52 years of age or older.  Have a long-term disease that lowers your body's ability to fight infection (immunocompromised).  Live in a nursing home or long-term care facility.  Have a long-term  (chronic) disease such as: ? Chronic lung disease, including chronic obstructive pulmonary disease or asthma ? Heart disease. ? Diabetes. ? Chronic kidney disease. ? Liver disease.  Are obese. What are the signs or symptoms? Symptoms of this condition can range from mild to severe. Symptoms may appear any time from 2 to 14 days after being exposed to the virus. They include:  A fever.  A cough.  Difficulty breathing.  Chills.  Muscle pains.  A sore throat.  Loss of taste or smell. Some people may also have stomach problems, such as nausea, vomiting, or diarrhea. Other people may not have any symptoms of COVID-19. How is this diagnosed? This condition may be diagnosed based on:  Your signs and symptoms, especially if: ? You live in an area with a COVID-19 outbreak. ? You recently traveled to or from an area where the virus is common. ? You provide care for or live with a person who was diagnosed with COVID-19.  A physical exam.  Lab tests, which may include: ? A nasal swab to take a sample of fluid from your nose. ? A throat swab to take a sample of fluid from your throat. ? A sample of mucus from your lungs (sputum). ? Blood tests.  Imaging tests, which may include, X-rays, CT scan, or ultrasound. How is this treated? At present, there is no medicine to treat COVID-19. Medicines that treat other diseases are being used on a trial basis to see if they are effective against COVID-19. Your health care provider will talk with you  about ways to treat your symptoms. For most people, the infection is mild and can be managed at home with rest, fluids, and over-the-counter medicines. Treatment for a serious infection usually takes places in a hospital intensive care unit (ICU). It may include one or more of the following treatments. These treatments are given until your symptoms improve.  Receiving fluids and medicines through an IV.  Supplemental oxygen. Extra oxygen is  given through a tube in the nose, a face mask, or a hood.  Positioning you to lie on your stomach (prone position). This makes it easier for oxygen to get into the lungs.  Continuous positive airway pressure (CPAP) or bi-level positive airway pressure (BPAP) machine. This treatment uses mild air pressure to keep the airways open. A tube that is connected to a motor delivers oxygen to the body.  Ventilator. This treatment moves air into and out of the lungs by using a tube that is placed in your windpipe.  Tracheostomy. This is a procedure to create a hole in the neck so that a breathing tube can be inserted.  Extracorporeal membrane oxygenation (ECMO). This procedure gives the lungs a chance to recover by taking over the functions of the heart and lungs. It supplies oxygen to the body and removes carbon dioxide. Follow these instructions at home: Lifestyle  If you are sick, stay home except to get medical care. Your health care provider will tell you how long to stay home. Call your health care provider before you go for medical care.  Rest at home as told by your health care provider.  Do not use any products that contain nicotine or tobacco, such as cigarettes, e-cigarettes, and chewing tobacco. If you need help quitting, ask your health care provider.  Return to your normal activities as told by your health care provider. Ask your health care provider what activities are safe for you. General instructions  Take over-the-counter and prescription medicines only as told by your health care provider.  Drink enough fluid to keep your urine pale yellow.  Keep all follow-up visits as told by your health care provider. This is important. How is this prevented?  There is no vaccine to help prevent COVID-19 infection. However, there are steps you can take to protect yourself and others from this virus. To protect yourself:   Do not travel to areas where COVID-19 is a risk. The areas where  COVID-19 is reported change often. To identify high-risk areas and travel restrictions, check the CDC travel website: FatFares.com.br  If you live in, or must travel to, an area where COVID-19 is a risk, take precautions to avoid infection. ? Stay away from people who are sick. ? Wash your hands often with soap and water for 20 seconds. If soap and water are not available, use an alcohol-based hand sanitizer. ? Avoid touching your mouth, face, eyes, or nose. ? Avoid going out in public, follow guidance from your state and local health authorities. ? If you must go out in public, wear a cloth face covering or face mask. ? Disinfect objects and surfaces that are frequently touched every day. This may include:  Counters and tables.  Doorknobs and light switches.  Sinks and faucets.  Electronics, such as phones, remote controls, keyboards, computers, and tablets. To protect others: If you have symptoms of COVID-19, take steps to prevent the virus from spreading to others.  If you think you have a COVID-19 infection, contact your health care provider right away. Tell your  health care team that you think you may have a COVID-19 infection.  Stay home. Leave your house only to seek medical care. Do not use public transport.  Do not travel while you are sick.  Wash your hands often with soap and water for 20 seconds. If soap and water are not available, use alcohol-based hand sanitizer.  Stay away from other members of your household. Let healthy household members care for children and pets, if possible. If you have to care for children or pets, wash your hands often and wear a mask. If possible, stay in your own room, separate from others. Use a different bathroom.  Make sure that all people in your household wash their hands well and often.  Cough or sneeze into a tissue or your sleeve or elbow. Do not cough or sneeze into your hand or into the air.  Wear a cloth face covering  or face mask. Where to find more information  Centers for Disease Control and Prevention: PurpleGadgets.be  World Health Organization: https://www.castaneda.info/ Contact a health care provider if:  You live in or have traveled to an area where COVID-19 is a risk and you have symptoms of the infection.  You have had contact with someone who has COVID-19 and you have symptoms of the infection. Get help right away if:  You have trouble breathing.  You have pain or pressure in your chest.  You have confusion.  You have bluish lips and fingernails.  You have difficulty waking from sleep.  You have symptoms that get worse. These symptoms may represent a serious problem that is an emergency. Do not wait to see if the symptoms will go away. Get medical help right away. Call your local emergency services (911 in the [place] Do not drive yourself to the hospital. Let the emergency medical personnel know if you think you have COVID-19. Summary  COVID-19 is a respiratory infection that is caused by a virus. It is also known as coronavirus disease or novel coronavirus. It can cause serious infections, such as pneumonia, acute respiratory distress syndrome, acute respiratory failure, or sepsis.  The virus that causes COVID-19 is contagious. This means that it can spread from person to person through droplets from coughs and sneezes.  You are more likely to develop a serious illness if you are 39 years of age or older, have a weak immunity, live in a nursing home, or have chronic disease.  There is no medicine to treat COVID-19. Your health care provider will talk with you about ways to treat your symptoms.  Take steps to protect yourself and others from infection. Wash your hands often and disinfect objects and surfaces that are frequently touched every day. Stay away from people who are sick and wear a mask if you are sick. This information is not intended  to replace advice given to you by your health care provider. Make sure you discuss any questions you have with your health care provider. Document Released: 11/19/2018 Document Revised: 03/11/2019 Document Reviewed: 11/19/2018 Elsevier Patient Education  2020 [employer].

## 2019-10-13 NOTE — Progress Notes (Signed)
Virtual Visit via Video   This visit type was conducted due to national recommendations for restrictions regarding the COVID-19 Pandemic (e.g. social distancing) in an effort to limit this patient's exposure and mitigate transmission in our community.  Due to her co-morbid illnesses, this patient is at least at moderate risk for complications without adequate follow up.  This format is felt to be most appropriate for this patient at this time.  All issues noted in this document were discussed and addressed.  A limited physical exam was performed with this format.    This visit type was conducted due to national recommendations for restrictions regarding the COVID-19 Pandemic (e.g. social distancing) in an effort to limit this patient's exposure and mitigate transmission in our community.  Patients identity confirmed using two different identifiers.  This format is felt to be most appropriate for this patient at this time.  All issues noted in this document were discussed and addressed.  No physical exam was performed (except for noted visual exam findings with Video Visits).    Date:  10/13/2019   ID:  Dana Vasquez, DOB [birth year], MRN [id number]  Patient Location:  Her daughter's home in [place]  Provider location:   Office    Chief Complaint:  "I have COVID"  History of Present Illness:    Dana Vasquez is a 53 y.o. female who presents via video conferencing for a telehealth visit today.    The patient does have symptoms concerning for COVID-19 infection (fever, chills, cough, or new shortness of breath).   She presents today for virtual visit. She prefers this method of contact due to COVID-19 pandemic.  She presents for further evaluation regarding COVID. States she is in [place] visiting her daughter, and now has Dana Vasquez. She reports her daughter and SIL both have it. She reports developing sx around 12/8. She has a productive cough and shortness of breath, along with  fever/chills.     Past Medical History:  Diagnosis Date  . Allergy   . Anemia   . Arthritis    knees  AND SHOULDERS  . Asthma    allergy related - rarely uses inhaler  . Bruises easily   . Bunion   . Cancer (HCC)    1995;thyroid cancer, SURGERY AND RADIOACTIVE IODINE  . Ectopic pregnancy   . Euthyroid   . Fatigue   . Fibromyalgia   . GERD (gastroesophageal reflux disease)   . Headache(784.0)    otc med prn - last one 02/2014  . History of blood transfusion 2000   In [place], [place] at [place] - ? 4 units transfused  . Hypothyroidism   . Knee pain   . Plantar fasciitis   . Sleep apnea    uses CPAP  . SVD (spontaneous vaginal delivery)    x 3  . Thyroid disease    thyroidectomy 1995   Past Surgical History:  Procedure Laterality Date  . ABDOMINAL HYSTERECTOMY N/A 04/20/2014   Procedure: Total ABDOMINAL HYSTERECTOMY partial right salpingectomy;  Surgeon: Eldred Manges, MD;  Location: [facility];  Service: Gynecology;  Laterality: N/A;  . BREATH TEK H PYLORI  09/18/2011   Procedure: BREATH TEK H PYLORI;  Surgeon: Pedro Earls, MD;  Location: [facility];  Service: General;  Laterality: N/A;  . BUNIONECTOMY     left;2015  . BUNIONECTOMY    . COLONOSCOPY    . DILATION AND CURETTAGE OF UTERUS  1988   endometriosis  . ECTOPIC PREGNANCY SURGERY    .  fallopian tubes removed     left tube removed per patient - laparotomy  . FOOT SURGERY Right 2019  . FOOT SURGERY Bilateral   . LAPAROSCOPIC GASTRIC SLEEVE RESECTION N/A 04/14/2018   Procedure: LAPAROSCOPIC GASTRIC SLEEVE RESECTION WITH UPPER ENDO AND ERAS PATHWAY;  Surgeon: Johnathan Hausen, MD;  Location: [facility];  Service: General;  Laterality: N/A;  . LYSIS OF ADHESION N/A 04/20/2014   Procedure: LYSIS OF ADHESION;  Surgeon: Eldred Manges, MD;  Location: [facility];  Service: Gynecology;  Laterality: N/A;  . THYROIDECTOMY  1995  . TUBAL LIGATION    . WISDOM TOOTH EXTRACTION       Current Meds  Medication Sig  .  atorvastatin (LIPITOR) 10 MG tablet Take 10 mg by mouth daily.  Marland Kitchen levothyroxine (SYNTHROID) 112 MCG tablet Take 1 tablet (112 mcg total) by mouth daily.  . Multiple Vitamins-Minerals (BARIATRIC MULTIVITAMINS/IRON) CAPS Take 1 capsule by mouth daily.   . Probiotic Product (PROBIOTIC PO) Take by mouth.     Allergies:   Amoxicillin, Nitrofurantoin monohyd macro, Dexilant [dexlansoprazole], Meloxicam, Budesonide, and Formoterol fumarate   Social History   Tobacco Use  . Smoking status: Never Smoker  . Smokeless tobacco: Never Used  Substance Use Topics  . Alcohol use: Yes    Alcohol/week: 0.0 standard drinks    Comment: occ  . Drug use: No     Family Hx: The patient's family history includes Allergic rhinitis in her daughter and daughter; Cancer in her brother, father, and mother; Early death in her brother; Food Allergy in her daughter; Heart disease in her maternal uncle; Hypertension in her mother; Stroke in her maternal uncle. There is no history of Angioedema, Asthma, Eczema, Immunodeficiency, or Urticaria.  ROS:   Please see the history of present illness.    Review of Systems  Constitutional: Positive for malaise/fatigue.  Respiratory: Positive for cough.   Cardiovascular: Negative.   Gastrointestinal: Negative.   Neurological: Negative.   Psychiatric/Behavioral: Negative.     All other systems reviewed and are negative.   Labs/Other Tests and Data Reviewed:    Recent Labs: 09/13/2019: ALT 12; BUN 21; Creatinine, Ser 1.03; Hemoglobin 12.8; Platelets 322; Potassium 4.1; Sodium 144; TSH 1.550   Recent Lipid Panel Lab Results  Component Value Date/Time   CHOL 182 09/13/2019 09:31 AM   TRIG 67 09/13/2019 09:31 AM   HDL 70 09/13/2019 09:31 AM   CHOLHDL 2.6 09/13/2019 09:31 AM   CHOLHDL 3.7 10/10/2011 09:14 AM   LDLCALC 99 09/13/2019 09:31 AM    Wt Readings from Last 3 Encounters:  10/13/19 199 lb (90.3 kg)  09/13/19 199 lb 9.6 oz (90.5 kg)  08/10/19 199 lb 6.4 oz  (90.4 kg)     Exam:    Vital Signs:  Temp 99.7 F (37.6 C) (Oral)   Wt 199 lb (90.3 kg)   LMP 04/07/2014   BMI 34.16 kg/m     Physical Exam  Constitutional: She is oriented to person, place, and time. She appears distressed.  Appears ill  HENT:  Head: Normocephalic and atraumatic.  Pulmonary/Chest: Effort normal.  She coughs frequently during the visit.   Musculoskeletal:     Cervical back: Normal range of motion.  Neurological: She is alert and oriented to person, place, and time.  Psychiatric: Affect normal.  Nursing note and vitals reviewed.   ASSESSMENT & PLAN:     1. COVID-19  She was given rx zpak to take as directed. She is encouraged to complete full  abx course. She was also advised to take Oscillococcinum OTC 2 vials twice daily today, then two daily x 5 days. Pt advised this may shorten the course of her symptoms. She will let me know if her sx persist.   2. Bronchitis due to COVID-19 virus  She will continue with Delsym prn. She is advised to go to ER should she develop worsening shortness of breath. She may benefit from albuterol inhaler if she has no improvement with abx therapy.     COVID-19 Education: The signs and symptoms of COVID-19 were discussed with the patient and how to seek care for testing (follow up with PCP or arrange E-visit).  The importance of social distancing was discussed today.  Patient Risk:   After full review of this patients clinical status, I feel that they are at least moderate risk at this time.     Medication Adjustments/Labs and Tests Ordered: Current medicines are reviewed at length with the patient today.  Concerns regarding medicines are outlined above.   Tests Ordered: No orders of the defined types were placed in this encounter.   Medication Changes: Meds ordered this encounter  Medications  . azithromycin (ZITHROMAX Z-PAK) 250 MG tablet    Sig: Take 2 tablets (500 mg) on  Day 1,  followed by 1 tablet (250 mg)  once daily on Days 2 through 5.    Dispense:  6 each    Refill:  0    Disposition:  Follow up prn  Signed, [name], MD

## 2019-10-16 ENCOUNTER — Encounter: Payer: Self-pay | Admitting: Internal Medicine

## 2019-10-18 ENCOUNTER — Other Ambulatory Visit: Payer: Self-pay | Admitting: Internal Medicine

## 2019-10-18 MED ORDER — PREDNISONE 10 MG (21) PO TBPK: ORAL_TABLET | ORAL | 0 refills | Status: DC

## 2019-10-26 ENCOUNTER — Telehealth: Payer: Self-pay

## 2019-10-26 ENCOUNTER — Encounter: Payer: Self-pay | Admitting: Internal Medicine

## 2019-10-26 NOTE — Telephone Encounter (Signed)
Called pt after receiving mychart message. Advised pt to go to urgent care.

## 2019-10-29 HISTORY — PX: FOOT SURGERY: SHX648

## 2019-11-02 ENCOUNTER — Ambulatory Visit: Payer: BC Managed Care – PPO | Attending: Internal Medicine

## 2019-11-02 DIAGNOSIS — Z20822 Contact with and (suspected) exposure to covid-19: Secondary | ICD-10-CM

## 2019-11-03 ENCOUNTER — Encounter: Payer: Self-pay | Admitting: Internal Medicine

## 2019-11-04 ENCOUNTER — Other Ambulatory Visit: Payer: Self-pay

## 2019-11-04 ENCOUNTER — Telehealth (INDEPENDENT_AMBULATORY_CARE_PROVIDER_SITE_OTHER): Payer: BC Managed Care – PPO | Admitting: Nurse Practitioner

## 2019-11-04 ENCOUNTER — Encounter: Payer: Self-pay | Admitting: Nurse Practitioner

## 2019-11-04 VITALS — BP 122/87 | Wt 199.0 lb

## 2019-11-04 DIAGNOSIS — R0600 Dyspnea, unspecified: Secondary | ICD-10-CM | POA: Diagnosis not present

## 2019-11-04 DIAGNOSIS — U071 COVID-19: Secondary | ICD-10-CM

## 2019-11-04 LAB — NOVEL CORONAVIRUS, NAA: SARS-CoV-2, NAA: NOT DETECTED

## 2019-11-04 MED ORDER — ALBUTEROL SULFATE (2.5 MG/3ML) 0.083% IN NEBU: 2.5000 mg | INHALATION_SOLUTION | Freq: Four times a day (QID) | RESPIRATORY_TRACT | 2 refills | Status: DC | PRN

## 2019-11-04 NOTE — Progress Notes (Signed)
Virtual Visit via Video   This visit type was conducted due to national recommendations for restrictions regarding the COVID-19 Pandemic (e.g. social distancing) in an effort to limit this patient's exposure and mitigate transmission in our community.  Due to her co-morbid illnesses, this patient is at least at moderate risk for complications without adequate follow up.  This format is felt to be most appropriate for this patient at this time.  All issues noted in this document were discussed and addressed.  A limited physical exam was performed with this format.    This visit type was conducted due to national recommendations for restrictions regarding the COVID-19 Pandemic (e.g. social distancing) in an effort to limit this patient's exposure and mitigate transmission in our community.  Patients identity confirmed using two different identifiers.  This format is felt to be most appropriate for this patient at this time.  All issues noted in this document were discussed and addressed.  No physical exam was performed (except for noted visual exam findings with Video Visits).    Date:  11/04/2019   ID:  Dana Vasquez, DOB 11-20-1965, MRN [id number]  Patient Location:  Home - spoke with [name]  Provider location:   Office    Chief Complaint:  Continued shortness of breath after covid  History of Present Illness:    Dana Vasquez is a 54 y.o. female who presents via video conferencing for a telehealth visit today.    The patient does not have symptoms concerning for COVID-19 infection (fever, chills, cough, or new shortness of breath).   She is having a virtual visit for shortness of breath since having covid.    December 11th diagnosed with covid, took a test on the January 7th, attempted to go back to work remotely on Tuesday by the second half of the day she was having worsening shortness of breath.    When she blows her nose she still sees red dots. She went to the  emergency room her lungs are inflammed. She went to the ER in [place] and treated with azithromycin and had prednisone one week ago.  She is using a nebulizer as well.  Her pulse ox has been 98-99.  Sometimes she can be laying down and her heart will go to 89.  Pulse ox is 95-96.  She does admit she is feeling better except for the shortness of breath.      Past Medical History:  Diagnosis Date  . Allergy   . Anemia   . Arthritis    knees  AND SHOULDERS  . Asthma    allergy related - rarely uses inhaler  . Bruises easily   . Bunion   . Cancer (HCC)    1995;thyroid cancer, SURGERY AND RADIOACTIVE IODINE  . Ectopic pregnancy   . Euthyroid   . Fatigue   . Fibromyalgia   . GERD (gastroesophageal reflux disease)   . Headache(784.0)    otc med prn - last one 02/2014  . History of blood transfusion 2000   In [place], [place] at [place] - ? 4 units transfused  . Hypothyroidism   . Knee pain   . Plantar fasciitis   . Sleep apnea    uses CPAP  . SVD (spontaneous vaginal delivery)    x 3  . Thyroid disease    thyroidectomy 1995   Past Surgical History:  Procedure Laterality Date  . ABDOMINAL HYSTERECTOMY N/A 04/20/2014   Procedure: Total ABDOMINAL HYSTERECTOMY partial right  salpingectomy;  Surgeon: Eldred Manges, MD;  Location: [facility];  Service: Gynecology;  Laterality: N/A;  . BREATH TEK H PYLORI  09/18/2011   Procedure: BREATH TEK H PYLORI;  Surgeon: Pedro Earls, MD;  Location: [facility];  Service: General;  Laterality: N/A;  . BUNIONECTOMY     left;2015  . BUNIONECTOMY    . COLONOSCOPY    . DILATION AND CURETTAGE OF UTERUS  1988   endometriosis  . ECTOPIC PREGNANCY SURGERY    . fallopian tubes removed     left tube removed per patient - laparotomy  . FOOT SURGERY Right 2019  . FOOT SURGERY Bilateral   . LAPAROSCOPIC GASTRIC SLEEVE RESECTION N/A 04/14/2018   Procedure: LAPAROSCOPIC GASTRIC SLEEVE RESECTION WITH UPPER ENDO AND ERAS PATHWAY;  Surgeon:  Johnathan Hausen, MD;  Location: [facility];  Service: General;  Laterality: N/A;  . LYSIS OF ADHESION N/A 04/20/2014   Procedure: LYSIS OF ADHESION;  Surgeon: Eldred Manges, MD;  Location: [facility];  Service: Gynecology;  Laterality: N/A;  . THYROIDECTOMY  1995  . TUBAL LIGATION    . WISDOM TOOTH EXTRACTION       Current Meds  Medication Sig  . atorvastatin (LIPITOR) 10 MG tablet Take 10 mg by mouth daily.  Marland Kitchen levothyroxine (SYNTHROID) 112 MCG tablet Take 1 tablet (112 mcg total) by mouth daily.  . Multiple Vitamins-Minerals (BARIATRIC MULTIVITAMINS/IRON) CAPS Take 1 capsule by mouth daily.   . Probiotic Product (PROBIOTIC PO) Take by mouth.     Allergies:   Amoxicillin, Nitrofurantoin monohyd macro, Dexilant [dexlansoprazole], Meloxicam, Budesonide, and Formoterol fumarate   Social History   Tobacco Use  . Smoking status: Never Smoker  . Smokeless tobacco: Never Used  Substance Use Topics  . Alcohol use: Yes    Alcohol/week: 0.0 standard drinks    Comment: occ  . Drug use: No     Family Hx: The patient's family history includes Allergic rhinitis in her daughter and daughter; Cancer in her brother, father, and mother; Early death in her brother; Food Allergy in her daughter; Heart disease in her maternal uncle; Hypertension in her mother; Stroke in her maternal uncle. There is no history of Angioedema, Asthma, Eczema, Immunodeficiency, or Urticaria.  ROS:   Please see the history of present illness.    Review of Systems  Constitutional: Positive for malaise/fatigue.  Respiratory: Positive for shortness of breath. Negative for cough and wheezing.   Cardiovascular: Negative.   Musculoskeletal: Negative.   Neurological: Negative.   Psychiatric/Behavioral: Negative.     All other systems reviewed and are negative.   Labs/Other Tests and Data Reviewed:    Recent Labs: 09/13/2019: ALT 12; BUN 21; Creatinine, Ser 1.03; Hemoglobin 12.8; Platelets 322; Potassium 4.1; Sodium 144;  TSH 1.550   Recent Lipid Panel Lab Results  Component Value Date/Time   CHOL 182 09/13/2019 09:31 AM   TRIG 67 09/13/2019 09:31 AM   HDL 70 09/13/2019 09:31 AM   CHOLHDL 2.6 09/13/2019 09:31 AM   CHOLHDL 3.7 10/10/2011 09:14 AM   LDLCALC 99 09/13/2019 09:31 AM    Wt Readings from Last 3 Encounters:  11/04/19 199 lb (90.3 kg)  10/13/19 199 lb (90.3 kg)  09/13/19 199 lb 9.6 oz (90.5 kg)     Exam:    Vital Signs:  BP 122/87 (BP Location: Left Arm, Patient Position: Sitting, Cuff Size: Large)   Wt 199 lb (90.3 kg)   LMP 04/07/2014   BMI 34.16 kg/m  Physical Exam  Constitutional: She is well-developed, well-nourished, and in no distress. No distress.  Pulmonary/Chest: She is in respiratory distress (labored breathing when talking and speaking in short sentences.).  Psychiatric: Mood, memory, affect and judgment normal.    ASSESSMENT & PLAN:    1. Dyspnea due to COVID-19  She is having dyspnea while sitting still  sats are 97-98%  She is to use her nebulizer as needed  I will refer to [facility] for an evaluation.    She is out of work until 11/16/2019  Discussed this may take some time to improve.    - Ambulatory referral to Pulmonology   COVID-19 Education: The signs and symptoms of COVID-19 were discussed with the patient and how to seek care for testing (follow up with PCP or arrange E-visit).  The importance of social distancing was discussed today.  Patient Risk:   After full review of this patients clinical status, I feel that they are at least moderate risk at this time.  Time:   Today, I have spent 20 minutes/ seconds with the patient with telehealth technology discussing above diagnoses.     Medication Adjustments/Labs and Tests Ordered: Current medicines are reviewed at length with the patient today.  Concerns regarding medicines are outlined above.   Tests Ordered: No orders of the defined types were placed in this  encounter.   Medication Changes: No orders of the defined types were placed in this encounter.   Disposition:  Follow up prn  Signed, Minette Brine, FNP

## 2019-11-08 ENCOUNTER — Encounter: Payer: Self-pay | Admitting: Internal Medicine

## 2019-11-16 ENCOUNTER — Ambulatory Visit: Payer: BC Managed Care – PPO | Admitting: Internal Medicine

## 2019-11-16 ENCOUNTER — Encounter: Payer: Self-pay | Admitting: Internal Medicine

## 2019-11-16 ENCOUNTER — Other Ambulatory Visit: Payer: Self-pay

## 2019-11-16 VITALS — BP 118/72 | HR 92 | Temp 98.6°F | Ht 63.6 in | Wt 201.0 lb

## 2019-11-16 DIAGNOSIS — R0602 Shortness of breath: Secondary | ICD-10-CM

## 2019-11-16 DIAGNOSIS — R002 Palpitations: Secondary | ICD-10-CM

## 2019-11-16 DIAGNOSIS — Z8616 Personal history of COVID-19: Secondary | ICD-10-CM | POA: Diagnosis not present

## 2019-11-16 NOTE — Patient Instructions (Addendum)
Vitamin D3- gel caps, 5000 units once daily  Vitamin C 500mg  daily  Magnesium 400mg  nightly  Zinc once daily  Stay well hydrated  Avoid dairy  Stay well hydrated  Drink ginger/turmeric tea

## 2019-11-17 ENCOUNTER — Encounter: Payer: Self-pay | Admitting: Internal Medicine

## 2019-11-17 LAB — CBC WITH DIFFERENTIAL/PLATELET
Basophils Absolute: 0 10*3/uL (ref 0.0–0.2)
Basos: 0 %
EOS (ABSOLUTE): 0.1 10*3/uL (ref 0.0–0.4)
Eos: 1 %
Hematocrit: 40.9 % (ref 34.0–46.6)
Hemoglobin: 12.8 g/dL (ref 11.1–15.9)
Immature Grans (Abs): 0.1 10*3/uL (ref 0.0–0.1)
Immature Granulocytes: 1 %
Lymphocytes Absolute: 3.3 10*3/uL — ABNORMAL HIGH (ref 0.7–3.1)
Lymphs: 32 %
MCH: 25.8 pg — ABNORMAL LOW (ref 26.6–33.0)
MCHC: 31.3 g/dL — ABNORMAL LOW (ref 31.5–35.7)
MCV: 82 fL (ref 79–97)
Monocytes Absolute: 0.8 10*3/uL (ref 0.1–0.9)
Monocytes: 8 %
Neutrophils Absolute: 6 10*3/uL (ref 1.4–7.0)
Neutrophils: 58 %
Platelets: 360 10*3/uL (ref 150–450)
RBC: 4.97 x10E6/uL (ref 3.77–5.28)
RDW: 15.2 % (ref 11.7–15.4)
WBC: 10.3 10*3/uL (ref 3.4–10.8)

## 2019-11-18 ENCOUNTER — Other Ambulatory Visit: Payer: Self-pay | Admitting: Internal Medicine

## 2019-11-18 ENCOUNTER — Other Ambulatory Visit: Payer: Self-pay

## 2019-11-18 MED ORDER — LEVOTHYROXINE SODIUM 112 MCG PO TABS: 112.0000 ug | ORAL_TABLET | Freq: Every day | ORAL | 1 refills | Status: DC

## 2019-11-22 ENCOUNTER — Encounter: Payer: Self-pay | Admitting: Internal Medicine

## 2019-11-23 ENCOUNTER — Ambulatory Visit (HOSPITAL_COMMUNITY)
Admission: RE | Admit: 2019-11-23 | Discharge: 2019-11-23 | Disposition: A | Discharge status: Home / Self Care | Payer: BC Managed Care – PPO | Source: Ambulatory Visit | Attending: Internal Medicine | Admitting: Internal Medicine

## 2019-11-23 ENCOUNTER — Other Ambulatory Visit: Payer: Self-pay

## 2019-11-23 DIAGNOSIS — R002 Palpitations: Secondary | ICD-10-CM | POA: Diagnosis not present

## 2019-11-23 DIAGNOSIS — R0602 Shortness of breath: Secondary | ICD-10-CM | POA: Diagnosis not present

## 2019-11-23 NOTE — Progress Notes (Signed)
  Echocardiogram 2D Echocardiogram has been performed.  Burnett Kanaris 11/23/2019, 12:10 PM

## 2019-11-24 ENCOUNTER — Encounter: Payer: Self-pay | Admitting: Internal Medicine

## 2019-11-24 NOTE — Progress Notes (Signed)
This visit occurred during the SARS-CoV-2 public health emergency.  Safety protocols were in place, including screening questions prior to the visit, additional usage of staff PPE, and extensive cleaning of exam room while observing appropriate contact time as indicated for disinfecting solutions.  Subjective:     Patient ID: Dana Vasquez , female    DOB: [birth year] , 54 y.o.   MRN: [id number]   Chief Complaint  Patient presents with  . Shortness of Breath    HPI  She is here today for evaluation of persistent SOB. She was diagnosed with COVID-19 in Dec 2020 while visiting her daughter and SIL. She went to help with her grandchild, because her daughter had been hospitalized during her pregnancy. She is still having SOB despite use of inhaler. She has noticed that she is having palpitations as well. She did try to return to work on 1/4, but she found it difficult to get through the day due to SOB and palpitations with exertion. She still has dry cough, fever/chills have resolved.     Past Medical History:  Diagnosis Date  . Allergy   . Anemia   . Arthritis    knees  AND SHOULDERS  . Asthma    allergy related - rarely uses inhaler  . Bruises easily   . Bunion   . Cancer (HCC)    1995;thyroid cancer, SURGERY AND RADIOACTIVE IODINE  . Ectopic pregnancy   . Euthyroid   . Fatigue   . Fibromyalgia   . GERD (gastroesophageal reflux disease)   . Headache(784.0)    otc med prn - last one 02/2014  . History of blood transfusion 2000   In [place], [place] at [employer] - ? 4 units transfused  . Hypothyroidism   . Knee pain   . Plantar fasciitis   . Sleep apnea    uses CPAP  . SVD (spontaneous vaginal delivery)    x 3  . Thyroid disease    thyroidectomy 1995     Family History  Problem Relation Age of Onset  . Hypertension Mother   . Cancer Mother        uterine  . Cancer Father   . Cancer Brother        stomach; 24  . Early death Brother   . Stroke Maternal Uncle   .  Heart disease Maternal Uncle   . Food Allergy Daughter   . Allergic rhinitis Daughter   . Allergic rhinitis Daughter   . Angioedema Neg Hx   . Asthma Neg Hx   . Eczema Neg Hx   . Immunodeficiency Neg Hx   . Urticaria Neg Hx      Current Outpatient Medications:  .  Fluticasone-Salmeterol (WIXELA INHUB) 250-50 MCG/DOSE AEPB, Inhale 1 puff into the lungs 2 (two) times daily., Disp: , Rfl:  .  ipratropium-albuterol (DUONEB) 0.5-2.5 (3) MG/3ML SOLN, Take 3 mLs by nebulization., Disp: , Rfl:  .  levalbuterol (XOPENEX) 0.63 MG/3ML nebulizer solution, Take 0.63 mg by nebulization every 4 (four) hours as needed for wheezing or shortness of breath., Disp: , Rfl:  .  albuterol (PROVENTIL) (2.5 MG/3ML) 0.083% nebulizer solution, Take 3 mLs (2.5 mg total) by nebulization every 6 (six) hours as needed for wheezing or shortness of breath. (Patient not taking: Reported on 11/16/2019), Disp: 75 mL, Rfl: 2 .  atorvastatin (LIPITOR) 10 MG tablet, Take 10 mg by mouth daily., Disp: , Rfl:  .  levothyroxine (SYNTHROID) 112 MCG tablet, Take 1 tablet (112 mcg total)  by mouth daily., Disp: 90 tablet, Rfl: 1 .  Multiple Vitamins-Minerals (BARIATRIC MULTIVITAMINS/IRON) CAPS, Take 1 capsule by mouth daily. , Disp: , Rfl:  .  Probiotic Product (PROBIOTIC PO), Take by mouth., Disp: , Rfl:    Allergies  Allergen Reactions  . Amoxicillin Other (See Comments)    Headache Has patient had a PCN reaction causing immediate rash, facial/tongue/throat swelling, SOB or lightheadedness with hypotension: No Has patient had a PCN reaction causing severe rash involving mucus membranes or skin necrosis: No Has patient had a PCN reaction that required hospitalization: No Has patient had a PCN reaction occurring within the last 10 years: Unknown If all of the above answers are "NO", then may proceed with Cephalosporin use.   . Nitrofurantoin Monohyd Macro Other (See Comments)    Pt stated she gets a bad yeast infection  .  Dexilant [Dexlansoprazole]     "bad stomachache"  . Meloxicam     Upset stomach and stomach pains  . Budesonide Rash  . Formoterol Fumarate Rash     Review of Systems  Constitutional: Negative.   Respiratory: Positive for cough and shortness of breath.   Cardiovascular: Positive for palpitations. Negative for leg swelling.  Gastrointestinal: Negative.   Neurological: Negative.   Psychiatric/Behavioral: Negative.      Today's Vitals   11/16/19 1422  BP: 118/72  Pulse: 92  Temp: 98.6 F (37 C)  SpO2: 98%  Weight: 201 lb (91.2 kg)  Height: 5' 3.6" (1.615 m)   Body mass index is 34.94 kg/m.   Objective:  Physical Exam Vitals and nursing note reviewed.  Constitutional:      Appearance: Normal appearance.  HENT:     Head: Normocephalic and atraumatic.  Cardiovascular:     Rate and Rhythm: Normal rate and regular rhythm.     Heart sounds: Normal heart sounds.  Pulmonary:     Effort: Pulmonary effort is normal.     Breath sounds: Decreased breath sounds present. No wheezing or rhonchi.  Skin:    General: Skin is warm.  Neurological:     General: No focal deficit present.     Mental Status: She is alert.  Psychiatric:        Mood and Affect: Mood normal.        Behavior: Behavior normal.         Assessment And Plan:     1. Shortness of breath  I will refer her to Pulmonary for further evaluation. I will also refer her for echocardiogram. I will check CBC to r/o anemia. She is in agreement with treatment plan and is okay with referrals. Due to her persistent symptoms, I will take her out of work for the remainder of January. She will have HR send me paperwork to complete.   - Ambulatory referral to Pulmonology - ECHOCARDIOGRAM COMPLETE; Future - CBC with Diff  2. Palpitations  She is encouraged to stay well hydrated. Advised to start magnesium supplementation nightly.   - ECHOCARDIOGRAM COMPLETE; Future  3. Personal history of covid-19  - Ambulatory  referral to Pulmonology        [name], MD    THE PATIENT IS ENCOURAGED TO PRACTICE SOCIAL DISTANCING DUE TO THE COVID-19 PANDEMIC.

## 2019-11-29 ENCOUNTER — Ambulatory Visit: Payer: BC Managed Care – PPO | Admitting: Emergency Medicine

## 2019-11-29 ENCOUNTER — Encounter: Payer: Self-pay | Admitting: Emergency Medicine

## 2019-11-29 ENCOUNTER — Ambulatory Visit (INDEPENDENT_AMBULATORY_CARE_PROVIDER_SITE_OTHER): Payer: BC Managed Care – PPO

## 2019-11-29 ENCOUNTER — Other Ambulatory Visit: Payer: Self-pay

## 2019-11-29 ENCOUNTER — Ambulatory Visit: Payer: BC Managed Care – PPO | Admitting: Internal Medicine

## 2019-11-29 ENCOUNTER — Encounter: Payer: Self-pay | Admitting: Internal Medicine

## 2019-11-29 VITALS — BP 124/84 | HR 78 | Ht 65.0 in | Wt 207.0 lb

## 2019-11-29 VITALS — BP 124/70 | HR 72 | Temp 97.7°F | Ht 64.0 in | Wt 203.6 lb

## 2019-11-29 DIAGNOSIS — J383 Other diseases of vocal cords: Secondary | ICD-10-CM | POA: Diagnosis not present

## 2019-11-29 DIAGNOSIS — R002 Palpitations: Secondary | ICD-10-CM | POA: Diagnosis not present

## 2019-11-29 DIAGNOSIS — R0602 Shortness of breath: Secondary | ICD-10-CM

## 2019-11-29 DIAGNOSIS — R0600 Dyspnea, unspecified: Secondary | ICD-10-CM

## 2019-11-29 DIAGNOSIS — D573 Sickle-cell trait: Secondary | ICD-10-CM

## 2019-11-29 DIAGNOSIS — Z8616 Personal history of COVID-19: Secondary | ICD-10-CM

## 2019-11-29 NOTE — Assessment & Plan Note (Addendum)
She carries a history of asthma and she notes chest tightness and dyspnea since she had COVID-19 in early December.  She had persistent cough which did finally improve.  She is left with dyspnea and a stuttering speech pattern due to shortness of breath.  She states that she feels like air completely stops moving intermittently.  Question whether this is upper airway obstruction, vocal cord dysfunction or spasm.  She is not wheezing, does not have stridor.  Consider something anatomical like variable or fixed intrathoracic obstruction (although no other evidence to support-she did have a thyroidectomy, so consider postsurgical changes).  Very interesting phenomenon.  Unsurprisingly not responsive to bronchodilators.  She needs pulmonary function testing to assess lower and upper airway airflow.  I will perform a chest x-ray today to ensure no parenchymal disease, may require CT.  Depending on her spirometry we may decide to CT her neck as well.  Question whether she might require visualization of her upper airway either by laryngoscopy or bronchoscopy depending on how things progress.  Given my suspicion that this is upper airway in nature I am going to go ahead and refer her to speech therapy to see if there are any techniques that would alleviate the intermittent upper airway symptoms, her stuttering breathing / speech pattern  Plan to stop the [facility] since it has not helped her, may be irritating her upper airway.  She can use Xopenex as needed

## 2019-11-29 NOTE — Progress Notes (Signed)
Subjective:    Patient ID: Dana Vasquez, female    DOB: [birth year], 54 y.o.   MRN: [id number]  HPI 54 year old woman, never smoker, with allergic rhinitis, carries a diagnosis of asthma made several years ago.  Also with remote thyroid cancer treated surgically and with radioactive iodine, GERD, hypothyroidism, former obstructive sleep apnea no longer CPAP following bariatric surgery, sickle cell trait.  Patient was diagnosed with COVID-19 on 10/08/2019 in [place]. She quarantined for 14 days, fever was better, but she continued to cough. Was seen in ED 12/26 but did not have to be admitted.  She continues to deal with shortness of breath, chest tightness, wheezing.  Shortness of breath is slowly improving.  She has also had some voice instability > developed a stuttering speech pattern, she describes it as intermittent UA obstruction, inability to get air in.  She is on [place], started in January - unsure whether it is helping her.  She has DuoNeb and Xopenex nebs which she uses rarely, again without clear effect.  No chest x-ray was done at the time of her COVID-19 infection.  Most recent available to me was 11/17/2017 which was normal.  Spirometry reviewed from 01/28/2017 showed possible mixed restriction and obstruction.  Airflows on 06/07/2016 reviewed and appear to be normal.   Review of Systems  Constitutional: Negative for activity change, appetite change, chills, diaphoresis, fatigue, fever and unexpected weight change.  HENT: Negative for congestion, dental problem, nosebleeds, postnasal drip, rhinorrhea, sinus pressure, sneezing, trouble swallowing and voice change.   Eyes: Negative for itching and visual disturbance.  Respiratory: Positive for chest tightness, shortness of breath and wheezing. Negative for cough, choking and stridor.   Cardiovascular: Negative for chest pain, palpitations and leg swelling.  Gastrointestinal: Negative for abdominal pain.  Musculoskeletal: Negative for  joint swelling and myalgias.  Skin: Negative for rash.  Neurological: Negative for syncope, light-headedness and headaches.  Psychiatric/Behavioral: Negative for sleep disturbance.    Past Medical History:  Diagnosis Date  . Allergy   . Anemia   . Arthritis    knees  AND SHOULDERS  . Asthma    allergy related - rarely uses inhaler  . Bruises easily   . Bunion   . Cancer (HCC)    1995;thyroid cancer, SURGERY AND RADIOACTIVE IODINE  . Ectopic pregnancy   . Euthyroid   . Fatigue   . Fibromyalgia   . GERD (gastroesophageal reflux disease)   . Headache(784.0)    otc med prn - last one 02/2014  . History of blood transfusion 2000   In [place], [place] at [place] - ? 4 units transfused  . Hypothyroidism   . Knee pain   . Plantar fasciitis   . Sleep apnea    uses CPAP  . SVD (spontaneous vaginal delivery)    x 3  . Thyroid disease    thyroidectomy 1995     Family History  Problem Relation Age of Onset  . Hypertension Mother   . Cancer Mother        uterine  . Cancer Father   . Cancer Brother        stomach; 25  . Early death Brother   . Stroke Maternal Uncle   . Heart disease Maternal Uncle   . Food Allergy Daughter   . Allergic rhinitis Daughter   . Allergic rhinitis Daughter   . Angioedema Neg Hx   . Asthma Neg Hx   . Eczema Neg Hx   . Immunodeficiency Neg Hx   .  Urticaria Neg Hx      Social History   Socioeconomic History  . Marital status: Married    Spouse name: Not on file  . Number of children: 3  . Years of education: Not on file  . Highest education level: Not on file  Occupational History  . Not on file  Tobacco Use  . Smoking status: Never Smoker  . Smokeless tobacco: Never Used  Substance and Sexual Activity  . Alcohol use: Yes    Alcohol/week: 0.0 standard drinks    Comment: occ  . Drug use: No  . Sexual activity: Yes    Birth control/protection: Surgical  Other Topics Concern  . Not on file  Social History Narrative  . Not on file    Social Determinants of Health   Financial Resource Strain:   . Difficulty of Paying Living Expenses: Not on file  Food Insecurity:   . Worried About Charity fundraiser in the Last Year: Not on file  . Ran Out of Food in the Last Year: Not on file  Transportation Needs:   . Lack of Transportation (Medical): Not on file  . Lack of Transportation (Non-Medical): Not on file  Physical Activity:   . Days of Exercise per Week: Not on file  . Minutes of Exercise per Session: Not on file  Stress:   . Feeling of Stress : Not on file  Social Connections:   . Frequency of Communication with Friends and Family: Not on file  . Frequency of Social Gatherings with Friends and Family: Not on file  . Attends Religious Services: Not on file  . Active Member of Clubs or Organizations: Not on file  . Attends Archivist Meetings: Not on file  . Marital Status: Not on file  Intimate Partner Violence:   . Fear of Current or Ex-Partner: Not on file  . Emotionally Abused: Not on file  . Physically Abused: Not on file  . Sexually Abused: Not on file     Allergies  Allergen Reactions  . Amoxicillin Other (See Comments)    Headache Has patient had a PCN reaction causing immediate rash, facial/tongue/throat swelling, SOB or lightheadedness with hypotension: No Has patient had a PCN reaction causing severe rash involving mucus membranes or skin necrosis: No Has patient had a PCN reaction that required hospitalization: No Has patient had a PCN reaction occurring within the last 10 years: Unknown If all of the above answers are "NO", then may proceed with Cephalosporin use.   . Nitrofurantoin Monohyd Macro Other (See Comments)    Pt stated she gets a bad yeast infection  . Dexilant [Dexlansoprazole]     "bad stomachache"  . Meloxicam     Upset stomach and stomach pains  . Budesonide Rash  . Formoterol Fumarate Rash     Outpatient Medications Prior to Visit  Medication Sig Dispense  Refill  . Ascorbic Acid (VITAMIN C) 1000 MG tablet Take 1,000 mg by mouth daily. daily    . atorvastatin (LIPITOR) 10 MG tablet Take 10 mg by mouth daily.    . Fluticasone-Salmeterol (WIXELA INHUB) 250-50 MCG/DOSE AEPB Inhale 1 puff into the lungs 2 (two) times daily.    Marland Kitchen ipratropium-albuterol (DUONEB) 0.5-2.5 (3) MG/3ML SOLN Take 3 mLs by nebulization.    Marland Kitchen levalbuterol (XOPENEX) 0.63 MG/3ML nebulizer solution Take 0.63 mg by nebulization every 4 (four) hours as needed for wheezing or shortness of breath.    . levothyroxine (SYNTHROID) 112 MCG tablet Take 1  tablet (112 mcg total) by mouth daily. 90 tablet 1  . Multiple Vitamins-Minerals (BARIATRIC MULTIVITAMINS/IRON) CAPS Take 2 capsules by mouth daily.     . Probiotic Product (PROBIOTIC PO) Take by mouth. As needed    . Zinc 50 MG TABS Take 1 tablet by mouth daily.    Marland Kitchen albuterol (PROVENTIL) (2.5 MG/3ML) 0.083% nebulizer solution Take 3 mLs (2.5 mg total) by nebulization every 6 (six) hours as needed for wheezing or shortness of breath. 75 mL 2   No facility-administered medications prior to visit.        Objective:   Physical Exam  Vitals:   11/29/19 1513  BP: 124/84  Pulse: 78  SpO2: 99%  Weight: 207 lb (93.9 kg)  Height: 5\' 5"  (1.651 m)   Gen: Pleasant, well-nourished, in no distress, somewhat anxious affect.  She has a stuttering speech pattern that appears to be breaks in air movement as opposed to classic stuttering -she can formulate words without difficulty but cannot complete a sentence  ENT: No lesions,  mouth clear,  oropharynx clear, no postnasal drip  Neck: No JVD, no stridor  Lungs: No use of accessory muscles, no wheeze.  Stuttering breathing pattern as described above.  Cardiovascular: RRR, heart sounds normal, no murmur or gallops, no peripheral edema  Musculoskeletal: No deformities, no cyanosis or clubbing  Neuro: alert, awake, non focal  Skin: Warm, no lesions or rash      Assessment & Plan:   Dyspnea She carries a history of asthma and she notes chest tightness and dyspnea since she had COVID-19 in early December.  She had persistent cough which did finally improve.  She is left with dyspnea and a stuttering speech pattern due to shortness of breath.  She states that she feels like air completely stops moving intermittently.  Question whether this is upper airway obstruction, vocal cord dysfunction or spasm.  She is not wheezing, does not have stridor.  Consider something anatomical like variable or fixed intrathoracic obstruction (although no other evidence to support-she did have a thyroidectomy, so consider postsurgical changes).  Very interesting phenomenon.  Unsurprisingly not responsive to bronchodilators.  She needs pulmonary function testing to assess lower and upper airway airflow.  I will perform a chest x-ray today to ensure no parenchymal disease, may require CT.  Depending on her spirometry we may decide to CT her neck as well.  Question whether she might require visualization of her upper airway either by laryngoscopy or bronchoscopy depending on how things progress.  Given my suspicion that this is upper airway in nature I am going to go ahead and refer her to speech therapy to see if there are any techniques that would alleviate the intermittent upper airway symptoms, her stuttering breathing / speech pattern  Plan to stop the [facility] since it has not helped her, may be irritating her upper airway.  She can use Xopenex as needed  Baltazar Apo, MD, PhD 11/29/2019, 4:00 PM [facility] [phone] or if no answer [phone]

## 2019-11-29 NOTE — Patient Instructions (Addendum)
We will perform a CXR today.  We will perform pulmonary function testing  Please stop [place] Stop DuoNebs  Use Xopenex up to every 6 hours if you need it for shortness of breath, chest tightness, wheezing. We will make a referral for you to see speech therapy for techniques to assist with upper airway obstructive symptoms Follow with Dr Lamonte Sakai in 1 month

## 2019-11-29 NOTE — Patient Instructions (Signed)
Calm, magnesium supplement  You may get at [employer], [employer], [employer] or another health food store.  1 tsp daily

## 2019-11-29 NOTE — Progress Notes (Signed)
This visit occurred during the SARS-CoV-2 public health emergency.  Safety protocols were in place, including screening questions prior to the visit, additional usage of staff PPE, and extensive cleaning of exam room while observing appropriate contact time as indicated for disinfecting solutions.  Subjective:     Patient ID: Dana Vasquez , female    DOB: April 12, 1966 , 54 y.o.   MRN: [id number]   Chief Complaint  Patient presents with  . Shortness of Breath    f/u    HPI  She is here today for f/u SOB and palpitations.  She feels her symptoms are slowly improving. She feels she is able to talk better. She is still SOB with exertion. Also with palpitations - occurs randomly, both at rest and with exertion. No associated chest pain or diaphoresis.   Shortness of Breath This is a recurrent problem. The current episode started more than 1 month ago. The problem occurs daily. Pertinent negatives include no chest pain, claudication, leg swelling or neck pain.     Past Medical History:  Diagnosis Date  . Allergy   . Anemia   . Arthritis    knees  AND SHOULDERS  . Asthma    allergy related - rarely uses inhaler  . Bruises easily   . Bunion   . Cancer (HCC)    1995;thyroid cancer, SURGERY AND RADIOACTIVE IODINE  . Ectopic pregnancy   . Euthyroid   . Fatigue   . Fibromyalgia   . GERD (gastroesophageal reflux disease)   . Headache(784.0)    otc med prn - last one 02/2014  . History of blood transfusion 2000   In [place], [place] at [place] - ? 4 units transfused  . Hypothyroidism   . Knee pain   . Plantar fasciitis   . Sleep apnea    uses CPAP  . SVD (spontaneous vaginal delivery)    x 3  . Thyroid disease    thyroidectomy 1995     Family History  Problem Relation Age of Onset  . Hypertension Mother   . Cancer Mother        uterine  . Cancer Father   . Cancer Brother        stomach; 55  . Early death Brother   . Stroke Maternal Uncle   . Heart disease Maternal  Uncle   . Food Allergy Daughter   . Allergic rhinitis Daughter   . Allergic rhinitis Daughter   . Angioedema Neg Hx   . Asthma Neg Hx   . Eczema Neg Hx   . Immunodeficiency Neg Hx   . Urticaria Neg Hx      Current Outpatient Medications:  .  albuterol (PROVENTIL) (2.5 MG/3ML) 0.083% nebulizer solution, Take 3 mLs (2.5 mg total) by nebulization every 6 (six) hours as needed for wheezing or shortness of breath., Disp: 75 mL, Rfl: 2 .  Ascorbic Acid (VITAMIN C) 1000 MG tablet, Take 1,000 mg by mouth daily. daily, Disp: , Rfl:  .  atorvastatin (LIPITOR) 10 MG tablet, Take 10 mg by mouth daily., Disp: , Rfl:  .  Fluticasone-Salmeterol (WIXELA INHUB) 250-50 MCG/DOSE AEPB, Inhale 1 puff into the lungs 2 (two) times daily., Disp: , Rfl:  .  ipratropium-albuterol (DUONEB) 0.5-2.5 (3) MG/3ML SOLN, Take 3 mLs by nebulization., Disp: , Rfl:  .  levalbuterol (XOPENEX) 0.63 MG/3ML nebulizer solution, Take 0.63 mg by nebulization every 4 (four) hours as needed for wheezing or shortness of breath., Disp: , Rfl:  .  levothyroxine (  SYNTHROID) 112 MCG tablet, Take 1 tablet (112 mcg total) by mouth daily., Disp: 90 tablet, Rfl: 1 .  Multiple Vitamins-Minerals (BARIATRIC MULTIVITAMINS/IRON) CAPS, Take 2 capsules by mouth daily. , Disp: , Rfl:  .  Probiotic Product (PROBIOTIC PO), Take by mouth. As needed, Disp: , Rfl:  .  Zinc 50 MG TABS, Take 1 tablet by mouth daily., Disp: , Rfl:    Allergies  Allergen Reactions  . Amoxicillin Other (See Comments)    Headache Has patient had a PCN reaction causing immediate rash, facial/tongue/throat swelling, SOB or lightheadedness with hypotension: No Has patient had a PCN reaction causing severe rash involving mucus membranes or skin necrosis: No Has patient had a PCN reaction that required hospitalization: No Has patient had a PCN reaction occurring within the last 10 years: Unknown If all of the above answers are "NO", then may proceed with Cephalosporin use.   .  Nitrofurantoin Monohyd Macro Other (See Comments)    Pt stated she gets a bad yeast infection  . Dexilant [Dexlansoprazole]     "bad stomachache"  . Meloxicam     Upset stomach and stomach pains  . Budesonide Rash  . Formoterol Fumarate Rash     Review of Systems  Constitutional: Negative.   Respiratory: Positive for shortness of breath.   Cardiovascular: Positive for palpitations. Negative for chest pain, claudication and leg swelling.  Gastrointestinal: Negative.   Musculoskeletal: Negative for neck pain.  Neurological: Negative.   Psychiatric/Behavioral: Negative.      Today's Vitals   11/29/19 0916  BP: 124/70  Pulse: 72  Temp: 97.7 F (36.5 C)  TempSrc: Oral  SpO2: 98%  Weight: 203 lb 9.6 oz (92.4 kg)  Height: 5\' 4"  (1.626 m)   Body mass index is 34.95 kg/m.   Objective:  Physical Exam Vitals and nursing note reviewed.  Constitutional:      Appearance: Normal appearance.  HENT:     Head: Normocephalic and atraumatic.     Mouth/Throat:     Comments: Voice is quite hoarse, there is shortness of breath with talking Cardiovascular:     Rate and Rhythm: Normal rate and regular rhythm.     Heart sounds: Normal heart sounds.  Pulmonary:     Effort: Pulmonary effort is normal.     Breath sounds: Normal breath sounds.  Skin:    General: Skin is warm.  Neurological:     General: No focal deficit present.     Mental Status: She is alert.  Psychiatric:        Mood and Affect: Mood normal.        Behavior: Behavior normal.         Assessment And Plan:     1. Shortness of breath  Recurrent. She has appt with Pulmonary later today for further evaluation of this. I will defer further recommendations to Pulm. Pt advised her sx could last up to 1-2 months after initial infection. She is currently out on ST disability.   2. Palpitations  This has lessened since last visit. Advised to c/w magnesium supplementation and importance of adequate hydration was  discussed with the patient.   3. Sickle cell trait (HCC)  Chronic, yet stable.   4. Personal history of covid-19   [name], MD    THE PATIENT IS ENCOURAGED TO PRACTICE SOCIAL DISTANCING DUE TO THE COVID-19 PANDEMIC.

## 2019-11-30 ENCOUNTER — Ambulatory Visit (INDEPENDENT_AMBULATORY_CARE_PROVIDER_SITE_OTHER): Payer: BC Managed Care – PPO | Admitting: Podiatry

## 2019-11-30 ENCOUNTER — Encounter: Payer: Self-pay | Admitting: Podiatry

## 2019-11-30 DIAGNOSIS — Q828 Other specified congenital malformations of skin: Secondary | ICD-10-CM

## 2019-11-30 NOTE — Telephone Encounter (Signed)
Let her know I reviewed her CXR her lungs looked normal which is good news.  She did have some arthritic changes in her spine (as she noted from the report).  This would not affect her breathing or her speech pattern

## 2019-11-30 NOTE — Telephone Encounter (Signed)
Dr. Lamonte Sakai, please review chest xray results.

## 2019-12-01 ENCOUNTER — Encounter: Payer: Self-pay | Admitting: Internal Medicine

## 2019-12-01 NOTE — Telephone Encounter (Signed)
  A CXR can show some types of lung inflammation but not all kinds. She still needs to get PFT.   I still want her to go see the speech therapists because I believe some of her shortness of breath may relate to abnormal movement of her vocal cords. They can help with this  The decision about being out of work all relates to when she feels well enough to perform her required tasks. There is nothing dangerous about going back to work, but if her current breathing will not let her do the job, then she would need to defer or possibly work modified hours or tasks. If she feels ready and able to do the job, then I am ok with her going back

## 2019-12-01 NOTE — Progress Notes (Signed)
She presents today for painful lesion plantar aspect of the right foot.  States that it is come back and is exquisitely painful.  Was stricken with Covid and left with considerable pulmonary and laryngeal problems.  Objective vital signs are stable she is alert and oriented x3.  Appears to be quite short of breath.  No cyanosis in the extremities.  Pulses remain palpable she is not tachycardic plantar aspect of the right foot does demonstrate a solitary poor keratoma just to the lateral of midline around the fifth metatarsal base.  Does not appear to be as prominent as it has been in the past.  There is no erythema edema cellulitis drainage or odor no open wounds or lesions.  Assessment: Porokeratosis plantar aspect right foot.  Plan: Mechanical debridement today followed by salicylic acid under occlusion to be left on for 3 days and then washed off thoroughly we will follow-up with her on an as-needed basis.  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

## 2019-12-01 NOTE — Telephone Encounter (Signed)
E-mail sent to patient with RB's results interpretation of the cxr Nothing further needed at this time; will sign off

## 2019-12-02 ENCOUNTER — Encounter: Payer: Self-pay | Admitting: Internal Medicine

## 2019-12-06 NOTE — Telephone Encounter (Signed)
Dr. Lamonte Sakai, please see pt's mychart message and advise on it for pt.

## 2019-12-07 NOTE — Telephone Encounter (Signed)
Dr Lamonte Sakai, please see other email from the pt with attachments of research on vocal cords issues related to covid and advise thanks

## 2019-12-07 NOTE — Telephone Encounter (Signed)
I was able to review. See associated message.

## 2019-12-07 NOTE — Telephone Encounter (Signed)
Please let her know that the findings described in the article are similar to what we discussed in the office - vocal cord dysfunction and associated shortness of breath. I am encouraged to read that this has been described in other patients and especially that the expected course is that the problem should resolve.   I would like for her to continue our plan of care, follow up with me as planned to assess her progress.

## 2019-12-08 ENCOUNTER — Other Ambulatory Visit: Payer: BC Managed Care – PPO

## 2019-12-08 ENCOUNTER — Telehealth: Payer: Self-pay | Admitting: Emergency Medicine

## 2019-12-08 NOTE — Telephone Encounter (Signed)
Patient is returning phone call.  Patient phone number is [phone].

## 2019-12-08 NOTE — Telephone Encounter (Signed)
Spoke with the pt I advised per last email that she needs to follow the plan of care outlined for her at her recent visit with Dr Lamonte Sakai She is concerned that she was not scheduled for f/u with PFT and ov with RB until mid March 2021  I advised that the PFT appts are back logged right now due to covid and we are sorry for such a delay on this  She is asking if RB would like to see her back in 1 month despite PFT not being done yet to check on her and see how she is doing with med changes and speech therapy  Please advise thanks   Also forwarding to [facility] to check status of speech therapy ref, thanks

## 2019-12-08 NOTE — Telephone Encounter (Signed)
ATC pt, received a fast busy signal x2. Will try back. 

## 2019-12-09 NOTE — Telephone Encounter (Signed)
Spoke to [place] at [facility].  She states they are behind and are now working with referrals placed in December.  She states she will call pt today and make her aware.

## 2019-12-09 NOTE — Telephone Encounter (Signed)
Yes, I think we should follow up before the PFT to check in. Thanks.

## 2019-12-09 NOTE — Telephone Encounter (Signed)
I will call to check on referral

## 2019-12-09 NOTE — Telephone Encounter (Signed)
Called pt and advised message from the provider. Pt understood and verbalized understanding. Nothing further is needed.    

## 2019-12-14 ENCOUNTER — Other Ambulatory Visit: Payer: Self-pay

## 2019-12-14 ENCOUNTER — Ambulatory Visit: Payer: BC Managed Care – PPO | Attending: Emergency Medicine | Admitting: Speech Pathology

## 2019-12-14 DIAGNOSIS — R498 Other voice and resonance disorders: Secondary | ICD-10-CM | POA: Insufficient documentation

## 2019-12-14 NOTE — Patient Instructions (Signed)
  Semi-occluded vocal tract exercises (SOVTE)  These allow your vocal folds to vibrate without excess tension and promotes high placement of the voice  Use SOVTE as a warm up before prolonged speaking and vocal exercises   High resistance: voicing through a stirring straw  Medium resistance: voicing through a drinking straw  Less resistance: Voiced /v/                            Lip or Tongue Trill                            Nasal "hums" /m/ and /n/                            Vowels /u/ and ee  Watch Vocal Straw Exercises with Ingo Titze on YouTube:  https://www.youtube.com/watch?[email or web address]  Pitch Glides for 2 minutes  Accents (siren)  Hum the National Anthem  A goal would be 2-3 minutes several times a day and prior to vocal exercises  As always, use good belly breathing while completing SOVTE  

## 2019-12-16 ENCOUNTER — Ambulatory Visit: Payer: BC Managed Care – PPO | Admitting: Speech Pathology

## 2019-12-16 NOTE — Therapy (Signed)
[facility] [address] [place], [place], [address] Phone: [phone]   Fax:  [phone]  Speech Language Pathology Evaluation  Patient Details  Name: Dana Vasquez MRN: [id number] Date of Birth: [birth year] Referring Provider (SLP): Baltazar Apo, MD   Encounter Date: 12/14/2019  End of Session - 12/14/19 1145   Number of Visits  13    Date for SLP Re-Evaluation  02/12/20    Authorization Type  BCBS    SLP Start Time  1152    SLP Stop Time   1242    SLP Time Calculation (min)  50 min    Activity Tolerance  Patient tolerated treatment well       Past Medical History:  Diagnosis Date  . Allergy   . Anemia   . Arthritis    knees  AND SHOULDERS  . Asthma    allergy related - rarely uses inhaler  . Bruises easily   . Bunion   . Cancer (HCC)    1995;thyroid cancer, SURGERY AND RADIOACTIVE IODINE  . Ectopic pregnancy   . Euthyroid   . Fatigue   . Fibromyalgia   . GERD (gastroesophageal reflux disease)   . Headache(784.0)    otc med prn - last one 02/2014  . History of blood transfusion 2000   In [place], [place] at [facility] - ? 4 units transfused  . Hypothyroidism   . Knee pain   . Plantar fasciitis   . Sleep apnea    uses CPAP  . SVD (spontaneous vaginal delivery)    x 3  . Thyroid disease    thyroidectomy 1995    Past Surgical History:  Procedure Laterality Date  . ABDOMINAL HYSTERECTOMY N/A 04/20/2014   Procedure: Total ABDOMINAL HYSTERECTOMY partial right salpingectomy;  Surgeon: Eldred Manges, MD;  Location: [facility];  Service: Gynecology;  Laterality: N/A;  . BREATH TEK H PYLORI  09/18/2011   Procedure: BREATH TEK H PYLORI;  Surgeon: Pedro Earls, MD;  Location: [facility];  Service: General;  Laterality: N/A;  . BUNIONECTOMY     left;2015  . BUNIONECTOMY    . COLONOSCOPY    . DILATION AND CURETTAGE OF UTERUS  1988   endometriosis  . ECTOPIC PREGNANCY SURGERY    . fallopian tubes  removed     left tube removed per patient - laparotomy  . FOOT SURGERY Right 2019  . FOOT SURGERY Bilateral   . LAPAROSCOPIC GASTRIC SLEEVE RESECTION N/A 04/14/2018   Procedure: LAPAROSCOPIC GASTRIC SLEEVE RESECTION WITH UPPER ENDO AND ERAS PATHWAY;  Surgeon: Johnathan Hausen, MD;  Location: [facility];  Service: General;  Laterality: N/A;  . LYSIS OF ADHESION N/A 04/20/2014   Procedure: LYSIS OF ADHESION;  Surgeon: Eldred Manges, MD;  Location: [facility];  Service: Gynecology;  Laterality: N/A;  . THYROIDECTOMY  1995  . TUBAL LIGATION    . WISDOM TOOTH EXTRACTION      There were no vitals filed for this visit.  Subjective Assessment - 12/14/19 1145   Subjective  "If I talk fast I can get it out."    Currently in Pain?  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 Lamonte Sakai, who is questioning vocal cord dysfunction. Patient had COVID-19 in early December 2020; she complains of dyspnea (only when speaking) and states her voice is "like a stutter hiccup," since that time. She has done personal research and wonders if she has vagus nerve damage from COVID-19. She reports history of similar issue with her voice 15 years ago: "It wasn't this bad but close," when she had onset of "stuttering" after having an MRI. Reports this lasted for 2 weeks and resolved with anti-anxiety medication. She has not yet seen ENT for her current issue, although she did see Dr. Wilburn Cornelia several years ago when having sinus infections, which she attributes to her CPAP machine. PMHx also noted for OSA, asthma,  allergic rhinitis, GERD, hypothyroidism, CKD stage 2, sleeve gastrectomy (2019), thyroidectomy (1995).     Behavioral/Cognition  alert, pleasant, cooperative    Mobility Status  ambulated unassisted without difficulty      Balance Screen   Has the patient fallen in the past 6 months  No    Has the patient had a decrease in activity level because of a fear of falling?   No    Is the patient reluctant to leave their home because of a fear of falling?   No      Prior Functional Status   Cognitive/Linguistic Baseline  Within functional limits      Cognition   Overall Cognitive Status  Within Functional Limits for tasks assessed      Auditory Comprehension   Overall Auditory Comprehension  Appears within functional limits for tasks assessed      Visual Recognition/Discrimination   Discrimination  Not tested      Reading Comprehension   Reading Status  Within funtional limits      Expression   Primary Mode of Expression  Verbal      Verbal Expression   Overall Verbal Expression  Appears within functional limits for tasks assessed      Written Expression   Written Expression  Not tested      Oral Motor/Sensory Function   Overall Oral Motor/Sensory Function  Appears within functional limits for tasks assessed    Labial ROM  Within Functional Limits    Labial Symmetry  Within Functional Limits    Labial Coordination  WFL    Lingual ROM  Within Functional Limits    Lingual Symmetry  Within Functional Limits    Lingual Coordination  WFL    Velum  Within Functional Limits      Motor Speech   Respiration  Impaired    Phonation  Hoarse;Aphonic;Low vocal intensity   intermittent/variable   Resonance  Within functional limits    Articulation  Impaired    Level of Impairment  Word   intermittent hesitation/sound repetition/blocking   Intelligibility  Intelligibility reduced    Conversation  75-100% accurate   primarily due to low vocal intensity   Motor Planning  Witnin  functional limits    Motor Speech Errors  Inconsistent;Aware    Phonation  Impaired    Volume  Decibel Level   average low 60s dB (masked) at 30 cm   Pitch  Appropriate      Standardized Assessments  Standardized Assessments   Other Assessment    Other Assessment  Oral motor examination is unremarkable; palate elevates symmetrically. Patient initiates vowel prolongation with termination of voicing after an average of 3-4 seconds, followed by stoppage of airflow with high-pitched squeaking. Intermittent voicing and occasional stuttering/blocking (articulatory) in spontaneous conversation. This appears more pronounced when presented with structured speech tasks such as phrase and paragraph level reading, sequential and alternating motion rates. "He al-way-s an-s-s-s-s-s-ers ban-na-na- o-o-o-o-o-oil." When tasked with reading in a whisper, flow of speech improves although she continued to have blocks/stuttering at times. There is no throat clearing or coughing. She only feels short of breath when speaking; denies breathing difficulties at other times. Her voice is like this constantly, although sometimes when she first wakes up in the morning she can speak normally for a few sentences. /s/ to /z/ ratio is 1.18. She does have a history of GERD and reports foreign body sensation with swallowing: "It feels like something is caught in my throat most of the time." She does not have coughing or choking when eating. She scored 40/50 on the Voice-Related Quality of Life scale (>30 is "poor"). 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 She returned for 2 days after recovering from COVID-19 but had shortness of breath when teaching remotely: "That's when this started," and has not been able to return.                      SLP Education - 12/14/19 1145   Education Details  semi-occluded vocal tract exercises, pursed lip breathing    Person(s) Educated  Patient     Methods  Explanation;Demonstration;Verbal cues;Handout    Comprehension  Verbalized understanding;Returned demonstration;Verbal cues required;Need further instruction       SLP Short Term Goals - 12/16/19 0715      SLP SHORT TERM GOAL #1   Title  Patient will demonstrate laryngeal relaxation strategies with occasional min cues x sessions.    Time  4    Period  Weeks    Status  New      SLP SHORT TERM GOAL #2   Title  Pt will use abdominal breathing in 18/20 sentences with rare min A x2 sessions.    Time  4    Period  Weeks    Status  New      SLP SHORT TERM GOAL #3   Title  Pt will use abdominal breathing 85% of the time in 8 minutes simple conversation x 2 sessions.    Time  4    Period  Weeks    Status  New      SLP SHORT TERM GOAL #4   Title  Patient will sustain vowel phonation for >6 seconds.    Time  4    Period  Weeks    Status  New       SLP Long Term Goals - 12/16/19 0729      SLP LONG TERM GOAL #1   Title  Pt will report using laryngeal relaxation strategy prior to speaking x 4 sessions.    Time  8    Period  Weeks    Status  New      SLP LONG TERM GOAL #2   Title  Patient will use abdominal breathing >85% of the time in 15 minutes mod complex conversation x 3 visits.      SLP LONG TERM GOAL #3   Title  Patient  will improve Voice-Related Quality of Life (VRQOL) score to good (<25) from poor (40).    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 She does not complain of breathing difficulties outside of speaking, no coughing episodes, no throat clearing. I do feel it would be appropriate for her to see ENT for laryngoscopy for direct visualization of the vocal cords to rule out potential physiological etiology for her voice problem. In diagnostic  intervention tasks today, patient had some success with semi-occluded vocal tract techniques (straw phonation), able increase sustained vowel for 10.2 seconds. I recommend skilled ST to train patient in laryngeal relaxation and other voice techniques to improve voice quality and endurance for communication at work and home.    Speech Therapy Frequency  2x / week    Duration  --   6 weeks or 13 visits   Treatment/Interventions  Oral motor exercises;SLP instruction and feedback;Functional tasks;Compensatory strategies;Patient/family education    Potential to Achieve Goals  Good    Consulted and Agree with Plan of Care  Patient       Patient will benefit from skilled therapeutic intervention in order to improve the following deficits and impairments:   Other voice and resonance disorders    Problem List Patient Active Problem List   Diagnosis Date Noted  . Dyspnea 11/29/2019  . Palpitations 12/28/2018  . PAC (premature atrial contraction) 12/28/2018  . Hypothyroidism 04/27/2018  . Dehydration 04/22/2018  . 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 Euthyroid thyroiditis 03/03/2018  . Arthritis 03/03/2018  . Diverticulosis of colon without diverticulitis 03/03/2018  . Gastroesophageal reflux disease 03/03/2018  . Moderate persistent asthma without complication 99991111  . Encounter for general adult medical examination without abnormal findings 12/12/2016  . 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 Glomerular disorders in diseases classified elsewhere 12/12/2016  . Other long term (current) drug therapy 12/12/2016  . Allergic rhinitis 06/07/2016  . Asthma with acute exacerbation 06/07/2016  . Chronic kidney disease, stage II (mild) 04/28/2016  . Acute recurrent pansinusitis 04/03/2016  . Cough, persistent 04/03/2016  . Hoarseness 04/03/2016  . Sickle cell trait  ([facility]) 03/28/2016  . Obstructive sleep apnea 02/29/2016  . Fibroid, uterine 04/20/2014  . Fibroids 04/19/2014  . Menorrhagia 02/12/2012  . Hx of ectopic pregnancy 02/12/2012  . Plantar fasciitis 05/29/2011   Deneise Lever, [place], CCC-SLP Speech-Language Pathologist  Aliene Altes 12/16/2019, 7:40 AM  [facility] [address] [place], [place], [address] Phone: [phone]   Fax:  [phone]  Name: SYBEL KOSTIUK MRN: [id number] Date of Birth: 26-Apr-1966

## 2019-12-21 ENCOUNTER — Ambulatory Visit: Payer: BC Managed Care – PPO | Admitting: Speech Pathology

## 2019-12-21 ENCOUNTER — Other Ambulatory Visit: Payer: Self-pay

## 2019-12-21 DIAGNOSIS — R498 Other voice and resonance disorders: Secondary | ICD-10-CM | POA: Diagnosis not present

## 2019-12-21 NOTE — Patient Instructions (Signed)
Abdominal Breathing exercises: practice 15 minutes twice a day   . Shoulders down - this is a cue to relax . Place your hand on your abdomen - this helps you focus on easy abdominal breath support - the best and most relaxed way to breathe . Breathe in through your nose and fill your belly with air, watching your hand move outward . Breathe out through your mouth and watch your belly move in. An audible "sh"  may help   Think of your belly as a balloon, when you fill with air (inhale), the balloon gets bigger. As the air goes out (exhale), the balloon deflates.  If you are having difficulty coordinating this, lay on your back with a plastic cup on your belly and repeat the above steps, watching you belly move up with inhalation and down with exhalations  Practice breathing in and out in front of a mirror, watching your belly Breathe in for a count of 5 and breathe out for a count of 5   Neck stretches: side to side, up and down, over each shoulder. Hold for at least 10 seconds, keep breathing.   Yawn-sigh: Open your mouth wide like you are going yawn and then breathe in to sigh (this might actually make you yawn). Feel the stretching and relaxation in the back of your throat. Repeat 5-10x.   Straw voicing: breathe in through your nose/mouth, then say "ooooh" through the straw (you can try this with a coffee stirrer, too). 10x Don't "push" your voice. Stop, breathe and then try again.   Voice with abdominal breathing: belly breath in, "ride the wave" of your voice with "ahhhhhhh." You are not changing anything in your throat, just let your breath do the work. If your voice stops or cracks, just stop, take another breath and try again.

## 2019-12-22 ENCOUNTER — Encounter: Payer: Self-pay | Admitting: Internal Medicine

## 2019-12-23 ENCOUNTER — Other Ambulatory Visit: Payer: Self-pay

## 2019-12-23 ENCOUNTER — Ambulatory Visit: Payer: BC Managed Care – PPO | Admitting: Speech Pathology

## 2019-12-23 ENCOUNTER — Ambulatory Visit: Payer: BC Managed Care – PPO | Attending: Internal Medicine

## 2019-12-23 DIAGNOSIS — R498 Other voice and resonance disorders: Secondary | ICD-10-CM

## 2019-12-23 DIAGNOSIS — Z20822 Contact with and (suspected) exposure to covid-19: Secondary | ICD-10-CM

## 2019-12-23 NOTE — Patient Instructions (Signed)
After your breathing practice and stretches   Take belly breath before each repetition of the exercises below:  -Yawn-sigh x5 (make the sound as you release the yawn) - "Mmm-hmmm" glide up and down 5-10 times - "Ooooh" up and down 5 times - "Ahhhh" up and down 5 times - Glide with counting 1-10 (skip 7)   Practice reading sentences with abdominal breathing. You may want to try this in front of a mirror, so that you can start to identify times where you see tension in your neck muscles. If you notice this, don't use that breath for speech. Stop, try some neck stretches or the gentle breathing through pursed lips until you feel yourself relax. Then, take a relaxed abdominal breath and try again.

## 2019-12-23 NOTE — Therapy (Signed)
[facility] [address] [place], [place], [address] Phone: [phone]   Fax:  [phone]  Speech Language Pathology Treatment  Patient Details  Name: Dana Vasquez MRN: [id number] Date of Birth: 1966/03/07 Referring Provider (SLP): Baltazar Apo, MD   Encounter Date: 12/23/2019  End of Session - 12/23/19 1736    Visit Number  3    Number of Visits  13    Date for SLP Re-Evaluation  02/12/20    Authorization Type  BCBS    SLP Start Time  1148    SLP Stop Time   1230    SLP Time Calculation (min)  42 min    Activity Tolerance  Patient tolerated treatment well       Past Medical History:  Diagnosis Date  . Allergy   . Anemia   . Arthritis    knees  AND SHOULDERS  . Asthma    allergy related - rarely uses inhaler  . Bruises easily   . Bunion   . Cancer (HCC)    1995;thyroid cancer, SURGERY AND RADIOACTIVE IODINE  . Ectopic pregnancy   . Euthyroid   . Fatigue   . Fibromyalgia   . GERD (gastroesophageal reflux disease)   . Headache(784.0)    otc med prn - last one 02/2014  . History of blood transfusion 2000   In [place], [place] at [facility] - ? 4 units transfused  . Hypothyroidism   . Knee pain   . Plantar fasciitis   . Sleep apnea    uses CPAP  . SVD (spontaneous vaginal delivery)    x 3  . Thyroid disease    thyroidectomy 1995    Past Surgical History:  Procedure Laterality Date  . ABDOMINAL HYSTERECTOMY N/A 04/20/2014   Procedure: Total ABDOMINAL HYSTERECTOMY partial right salpingectomy;  Surgeon: Eldred Manges, MD;  Location: [facility];  Service: Gynecology;  Laterality: N/A;  . BREATH TEK H PYLORI  09/18/2011   Procedure: BREATH TEK H PYLORI;  Surgeon: Pedro Earls, MD;  Location: [facility];  Service: General;  Laterality: N/A;  . BUNIONECTOMY     left;2015  . BUNIONECTOMY    . COLONOSCOPY    . DILATION AND CURETTAGE OF UTERUS  1988   endometriosis  . ECTOPIC PREGNANCY SURGERY     . fallopian tubes removed     left tube removed per patient - laparotomy  . FOOT SURGERY Right 2019  . FOOT SURGERY Bilateral   . LAPAROSCOPIC GASTRIC SLEEVE RESECTION N/A 04/14/2018   Procedure: LAPAROSCOPIC GASTRIC SLEEVE RESECTION WITH UPPER ENDO AND ERAS PATHWAY;  Surgeon: Johnathan Hausen, MD;  Location: [facility];  Service: General;  Laterality: N/A;  . LYSIS OF ADHESION N/A 04/20/2014   Procedure: LYSIS OF ADHESION;  Surgeon: Eldred Manges, MD;  Location: [facility];  Service: Gynecology;  Laterality: N/A;  . THYROIDECTOMY  1995  . TUBAL LIGATION    . WISDOM TOOTH EXTRACTION      There were no vitals filed for this visit.  Subjective Assessment - 12/23/19 1154    Subjective  "Just tightness and a little sore here."    Currently in Pain?  No/denies            ADULT SLP TREATMENT - 12/23/19 1154      General Information   Behavior/Cognition  Alert;Pleasant mood;Cooperative      Treatment Provided   Treatment provided  Cognitive-Linquistic      Cognitive-Linquistic Treatment   Treatment  focused on  Voice    Skilled Treatment  Began session with exercises for neck/laryngeal tension release based on pt's "S" statement. Average vocal intensity low 60s dB at 30 cm, however minimal hoarseness or stuttering this session. Abdominal breathing at rest was 90% accurate; progressed to gentle pitch glides with /oo/ /a/ and numbers 1-10 with rare min A. In sentence level task, patient required occasional min cues to ID laryngeal tension/strain; she was able to decrease these instances with shorter breath groups and using a mirror for self-monitoring for neck tension. MPT for /oo/ 7.1 seconds at end of session. Pitch range in conversation WNL (155-196 Hz).       Assessment / Recommendations / Plan   Plan  Continue with current plan of care      Progression Toward Goals   Progression toward goals  Progressing toward goals       SLP Education - 12/23/19 1736    Education Details  use  mirror to ID secondary tension when completing HEP    Person(s) Educated  Patient    Methods  Explanation;Demonstration;Verbal cues    Comprehension  Verbalized understanding;Verbal cues required;Need further instruction       SLP Short Term Goals - 12/23/19 1740      SLP SHORT TERM GOAL #1   Title  Patient will demonstrate laryngeal relaxation strategies with occasional min cues x sessions.    Time  4    Period  Weeks    Status  On-going      SLP SHORT TERM GOAL #2   Title  Pt will use abdominal breathing in 18/20 sentences with rare min A x2 sessions.    Time  4    Period  Weeks    Status  On-going      SLP SHORT TERM GOAL #3   Title  Pt will use abdominal breathing 85% of the time in 8 minutes simple conversation x 2 sessions.    Time  4    Period  Weeks    Status  On-going      SLP SHORT TERM GOAL #4   Title  Patient will sustain vowel phonation for >6 seconds.    Time  4    Period  Weeks    Status  On-going       SLP Long Term Goals - 12/23/19 1740      SLP LONG TERM GOAL #1   Title  Pt will report using laryngeal relaxation strategy prior to speaking x 4 sessions.    Time  8    Period  Weeks    Status  On-going      SLP LONG TERM GOAL #2   Title  Patient will use abdominal breathing >85% of the time in 15 minutes mod complex conversation x 3 visits.    Time  8    Period  Weeks    Status  On-going      SLP LONG TERM GOAL #3   Title  Patient will improve Voice-Related Quality of Life (VRQOL) score to good (<25) from poor (40).    Time  8    Period  Weeks    Status  On-going       Plan - 12/23/19 1737    Clinical Impression Statement  Vocal quality continues to improve; minimal/rare stuttering today. Pt stated she used abdominal breathing at home and her husband complemented her voice. Pt speaking in confidential voice vs. whisper today with rare phonation breaks noted. 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 Continued training  pt in exercises decrease laryngeal and secondary tension, and improve vocal function as well as abdominal breathing. Pitch range is WNL (146.8-207.7 Hz); modal pitch 185 Hz. Pt contemplating return to work and SLP has suggested pt consider return on part-time basis, using a microphone for remote teaching, scheduling periods of vocal rest between speaking demands. Continue skilled ST to train patient in laryngeal relaxation and other voice techniques to improve voice quality and endurance for communication at work and home.    Speech Therapy Frequency  2x / week    Duration  --   6 weeks or 13 visits   Treatment/Interventions  Oral motor exercises;SLP instruction and feedback;Functional tasks;Compensatory strategies;Patient/family education    Potential to Achieve Goals  Good    Consulted and Agree with Plan of Care  Patient       Patient will benefit from skilled therapeutic intervention in order to improve the following deficits and impairments:   Other voice and resonance disorders    Problem List Patient Active Problem List   Diagnosis Date Noted  . Dyspnea 11/29/2019  . Palpitations 12/28/2018  . PAC (premature atrial contraction) 12/28/2018  . Hypothyroidism 04/27/2018  . Dehydration 04/22/2018  . 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 Euthyroid thyroiditis 03/03/2018  . Arthritis 03/03/2018  . Diverticulosis of colon without diverticulitis 03/03/2018  . Gastroesophageal reflux disease 03/03/2018  . Moderate persistent asthma without complication 99991111  . Encounter for general adult medical examination without abnormal findings 12/12/2016  . 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 Glomerular disorders in diseases classified elsewhere 12/12/2016  . Other long term (current) drug therapy 12/12/2016  . Allergic rhinitis 06/07/2016  . Asthma with acute  exacerbation 06/07/2016  . Chronic kidney disease, stage II (mild) 04/28/2016  . Acute recurrent pansinusitis 04/03/2016  . Cough, persistent 04/03/2016  . Hoarseness 04/03/2016  . Sickle cell trait ([facility] [place]) 03/28/2016  . Obstructive sleep apnea 02/29/2016  . Fibroid, uterine 04/20/2014  . Fibroids 04/19/2014  . Menorrhagia 02/12/2012  . Hx of ectopic pregnancy 02/12/2012  . Plantar fasciitis 05/29/2011   Deneise Lever, [place], CCC-SLP Speech-Language Pathologist  Aliene Altes 12/23/2019, 5:40 PM  [facility] [address] [place], [place], [address] Phone: [phone]   Fax:  [phone]   Name: KERRYANN MARRERO MRN: [id number] Date of Birth: [birth year]

## 2019-12-23 NOTE — Therapy (Signed)
[facility] [address] [place], [place], [address] Phone: [phone]   Fax:  [phone]  Speech Language Pathology Treatment  Patient Details  Name: Dana Vasquez MRN: [id number] Date of Birth: 1966/09/18 Referring Provider (SLP): Baltazar Apo, MD   Encounter Date: 12/21/2019  End of Session - 12/21/19 1315   Visit Number  2    Number of Visits  13    Date for SLP Re-Evaluation  02/12/20    Authorization Type  BCBS    SLP Start Time  1315    SLP Stop Time   1400    SLP Time Calculation (min)  45 min    Activity Tolerance  Patient tolerated treatment well       Past Medical History:  Diagnosis Date  . Allergy   . Anemia   . Arthritis    knees  AND SHOULDERS  . Asthma    allergy related - rarely uses inhaler  . Bruises easily   . Bunion   . Cancer (HCC)    1995;thyroid cancer, SURGERY AND RADIOACTIVE IODINE  . Ectopic pregnancy   . Euthyroid   . Fatigue   . Fibromyalgia   . GERD (gastroesophageal reflux disease)   . Headache(784.0)    otc med prn - last one 02/2014  . History of blood transfusion 2000   In [place], [place] at [place] - ? 4 units transfused  . Hypothyroidism   . Knee pain   . Plantar fasciitis   . Sleep apnea    uses CPAP  . SVD (spontaneous vaginal delivery)    x 3  . Thyroid disease    thyroidectomy 1995    Past Surgical History:  Procedure Laterality Date  . ABDOMINAL HYSTERECTOMY N/A 04/20/2014   Procedure: Total ABDOMINAL HYSTERECTOMY partial right salpingectomy;  Surgeon: Eldred Manges, MD;  Location: [facility];  Service: Gynecology;  Laterality: N/A;  . BREATH TEK H PYLORI  09/18/2011   Procedure: BREATH TEK H PYLORI;  Surgeon: Pedro Earls, MD;  Location: [facility];  Service: General;  Laterality: N/A;  . BUNIONECTOMY     left;2015  . BUNIONECTOMY    . COLONOSCOPY    . DILATION AND CURETTAGE OF UTERUS  1988   endometriosis  . ECTOPIC PREGNANCY SURGERY     . fallopian tubes removed     left tube removed per patient - laparotomy  . FOOT SURGERY Right 2019  . FOOT SURGERY Bilateral   . LAPAROSCOPIC GASTRIC SLEEVE RESECTION N/A 04/14/2018   Procedure: LAPAROSCOPIC GASTRIC SLEEVE RESECTION WITH UPPER ENDO AND ERAS PATHWAY;  Surgeon: Johnathan Hausen, MD;  Location: [facility];  Service: General;  Laterality: N/A;  . LYSIS OF ADHESION N/A 04/20/2014   Procedure: LYSIS OF ADHESION;  Surgeon: Eldred Manges, MD;  Location: [facility];  Service: Gynecology;  Laterality: N/A;  . THYROIDECTOMY  1995  . TUBAL LIGATION    . WISDOM TOOTH EXTRACTION      There were no vitals filed for this visit.  Subjective Assessment -  12/21/19 1315   Subjective  "My understanding was that everything looked f-ine, but that there were spasms."            ADULT SLP TREATMENT -  12/21/19 1315     General Information   Behavior/Cognition  Alert;Pleasant mood;Cooperative      Treatment Provided   Treatment provided  Cognitive-Linquistic      Cognitive-Linquistic Treatment   Treatment focused on  Voice  Skilled Treatment  Patient reports improvement in her voice this week; she saw Dr. Blenda Nicely on 12/17/19. 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 Added these to pt's HEP (see pt instructions). Trained pt in abdominal breathing until 95% accuracy at rest with rare min cues. Only one instance of voice break/stuttering in first half of session (see "s" statement); pt using gentle confidential voice. When SLP added sustained phonation tasks to abdominal breathing, some mild-moderate vocal stammering with active neck tension; with mod cues for abdominal breathing, pt able to decrease this tension and average MPT for /a/ increased to 6.7 seconds.      Assessment / Recommendations / Plan   Plan  Continue with current plan of care      Progression Toward Goals   Progression toward goals   Progressing toward goals       SLP Education -  12/21/19 1315   Education Details  exercises to release muscle tension, abdominal breathing    Person(s) Educated  Patient    Methods  Explanation;Demonstration;Verbal cues;Handout    Comprehension  Verbalized understanding;Returned demonstration;Need further instruction       SLP Short Term Goals -  12/21/19 1315     SLP SHORT TERM GOAL #1   Title  Patient will demonstrate laryngeal relaxation strategies with occasional min cues x sessions.    Time  4    Period  Weeks    Status  On-going      SLP SHORT TERM GOAL #2   Title  Pt will use abdominal breathing in 18/20 sentences with rare min A x2 sessions.    Time  4    Period  Weeks    Status  On-going      SLP SHORT TERM GOAL #3   Title  Pt will use abdominal breathing 85% of the time in 8 minutes simple conversation x 2 sessions.    Time  4    Period  Weeks    Status  On-going      SLP SHORT TERM GOAL #4   Title  Patient will sustain vowel phonation for >6 seconds.    Time  4    Period  Weeks    Status  On-going       SLP Long Term Goals -  12/21/19 1315     SLP LONG TERM GOAL #1   Title  Pt will report using laryngeal relaxation strategy prior to speaking x 4 sessions.    Time  8    Period  Weeks    Status  On-going      SLP LONG TERM GOAL #2   Title  Patient will use abdominal breathing >85% of the time in 15 minutes mod complex conversation x 3 visits.    Time  8    Period  Weeks    Status  On-going      SLP LONG TERM GOAL #3   Title  Patient will improve Voice-Related Quality of Life (VRQOL) score to good (<25) from poor (40).    Time  8    Period  Weeks    Status  On-going       Plan -  12/21/19 1315    Clinical Impression Statement  Vocal quality is improved today; minimal/rare stuttering compared with eval. Pt speaking in confidential voice vs. whisper today with rare phonation breaks noted. Saw Dr. 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 Trained pt in exercises to decrease laryngeal and secondary tension, as well as abdominal breathing. Able to increase MPT for /a/ (4 seconds at eval) to 6.7 seconds with min-mod cues for abdominal breathing. Pitch range is WNL (146.8-207.7 Hz); modal pitch 185 Hz. Pt is unsure of whether she should return to work and what modifications might be necessary. 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 Continue skilled ST to train patient in laryngeal relaxation and other voice techniques to improve voice quality and endurance for communication at work and home.    Speech Therapy Frequency  2x / week    Duration  --   6 weeks or 13 visits   Treatment/Interventions  Oral motor exercises;SLP instruction and feedback;Functional tasks;Compensatory strategies;Patient/family education    Potential to Achieve Goals  Good    Consulted and Agree with Plan of Care  Patient       Patient will benefit from skilled therapeutic intervention in order to improve the following deficits and impairments:   Other voice and resonance disorders    Problem List Patient Active Problem List   Diagnosis Date Noted  . Dyspnea 11/29/2019  . Palpitations 12/28/2018  . PAC (premature atrial contraction) 12/28/2018  . Hypothyroidism 04/27/2018  . Dehydration 04/22/2018  . 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 Euthyroid thyroiditis 03/03/2018  . Arthritis 03/03/2018  . Diverticulosis of colon without diverticulitis 03/03/2018  . Gastroesophageal reflux disease 03/03/2018  . Moderate persistent asthma without complication 99991111  . Encounter for general adult medical examination without abnormal findings 12/12/2016  . Hypertensive chronic kidney disease with stage 1 through stage 4 chronic kidney disease, or unspecified chronic kidney disease  12/12/2016  . Glomerular disorders in diseases classified elsewhere 12/12/2016  . Other long term (current) drug therapy 12/12/2016  . Allergic rhinitis 06/07/2016  . Asthma with acute exacerbation 06/07/2016  . Chronic kidney disease, stage II (mild) 04/28/2016  . Acute recurrent pansinusitis 04/03/2016  . Cough, persistent 04/03/2016  . Hoarseness 04/03/2016  . Sickle cell trait ([place]) 03/28/2016  . Obstructive sleep apnea 02/29/2016  . Fibroid, uterine 04/20/2014  . Fibroids 04/19/2014  . Menorrhagia 02/12/2012  . Hx of ectopic pregnancy 02/12/2012  . Plantar fasciitis 05/29/2011   Dana Vasquez, [place], CCC-SLP Speech-Language Pathologist   Dana Vasquez 12/23/2019, 7:01 AM  [facility] [address] [place], [place], [address] Phone: [phone]   Fax:  [phone]   Name: Dana Vasquez MRN: [id number] Date of Birth: Jan 04, 1966

## 2019-12-24 ENCOUNTER — Encounter: Payer: Self-pay | Admitting: Internal Medicine

## 2019-12-24 LAB — NOVEL CORONAVIRUS, NAA: SARS-CoV-2, NAA: NOT DETECTED

## 2019-12-24 NOTE — Telephone Encounter (Signed)
Dr. Lamonte Sakai, please see pt's mychart message and advise on it for pt. Thanks!

## 2019-12-25 ENCOUNTER — Ambulatory Visit: Payer: BC Managed Care – PPO | Attending: Internal Medicine

## 2019-12-25 DIAGNOSIS — Z23 Encounter for immunization: Secondary | ICD-10-CM | POA: Insufficient documentation

## 2019-12-25 NOTE — Progress Notes (Signed)
   Covid-19 Vaccination Clinic  Name:  Dana Vasquez    MRN: [id number] DOB: 27-Oct-1966  12/25/2019  [name] was observed post Covid-19 immunization for 15 minutes without incidence. She was provided with Vaccine Information Sheet and instruction to access the V-Safe system.   [name] was instructed to call 911 with any severe reactions post vaccine: Marland Kitchen Difficulty breathing  . Swelling of your face and throat  . A fast heartbeat  . A bad rash all over your body  . Dizziness and weakness    Immunizations Administered    Name Date Dose VIS Date Route   Pfizer COVID-19 Vaccine 12/25/2019  4:32 PM 0.3 mL 10/08/2019 Intramuscular   Manufacturer: [place]   Lot: [id number]   [place]: [id number]

## 2019-12-27 ENCOUNTER — Encounter: Payer: Self-pay | Admitting: Internal Medicine

## 2019-12-28 ENCOUNTER — Ambulatory Visit: Payer: BC Managed Care – PPO | Attending: Emergency Medicine | Admitting: Speech Pathology

## 2019-12-28 ENCOUNTER — Other Ambulatory Visit: Payer: Self-pay

## 2019-12-28 DIAGNOSIS — R498 Other voice and resonance disorders: Secondary | ICD-10-CM | POA: Diagnosis not present

## 2019-12-28 NOTE — Patient Instructions (Addendum)
In the past we've talked about the fact that anxiety may be playing a role in your voice problem. I don't recommend taking any medication without consulting your doctor. Managing anxiety may help, but please see your doctor before changing any medications.  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 Sip water and renew your breath.   VOCAL HYGIENE PROGRAM  1. Avoid overuse of voice or excessive use of the voice . The vocal cords can become easily fatigued. Try to sort out what is "necessary" versus "unnecessary" talking in your environment. You do not need to STOP talking, but try to limit it as much as possible.  . Think of resting your voice just as long as you talk. For example, if you talk for 5 minutes, rest your voice completely for 5 minutes. . If your voice feels "tired" during the day, try to rest it as much as possible.  2. Avoid using an excessively loud voice or shouting/raising your voice . When you yell or raise your voice, the vocal cords slam into each other, much like a strong hand clap. This causes irritation, and if this irritation continues, hoarseness may increase. . If people in your home talk loudly, ask them to reduce the volume of their voices to help you decrease your volume as well. . Sit near or face the person to whom you are speaking.   3. Avoid talking over background noise . When talking in background noise, speech automatically has increased loudness, and as in number 2 above, continued loud speech can result in increased hoarseness due to irritation to the vocal cords.  . Do not talk over the radio or the TV. Mute them before speaking, go to a quieter place to talk, or sit next to the person with whom you are watching.  4. Talk in a voice that is soft, smooth,  and gentle . This allows the vocal cords to come together in a gentle way and allows the air being exhaled from the lungs to do most/all of the work when you are speaking. NO WHISPERING.   5. Avoid excessive throat clearing, coughing, and loud laughter . During these activities the vocal cords can also be slammed together in a hard way and foster irritation or swelling, which can cause hoarseness. . Try taking sips of room temperature/cool water with hard swallows to clear secretions from the throat, instead of throat clearing or coughing. . If you absolutely must clear your throat, do so as gently as possible. If you find yourself clearing your throat or coughing a lot, consult your physician. . You will want to laugh. Continue to do so, but softly and gently. Do not laugh loudly for long periods of time because this can increase the chances for vocal fold irritation and thus hoarseness. . Lozenges can help reduce the need to clear throat/cough during cold/allergy season.  6. Keep the mouth and throat lubricated . Drink at least 8-10 8 oz. glasses of water per day (64-80 oz.). This water can come in the form of drink mixes.  . Caffeinated beverages such as colas, coffee, and tea (hot tea AND sweet tea) dry out the vocal cords and then can cause irritation and thus hoarseness.  . Drinking water will keep your body hydrated. This will help  to decrease secretions in the throat, which cause people to clear their throats or cough. . If you are exercising outside (especially during drier weather), try to breathe through your nose as much as possible. If this is not possible, lifting the tongue up behind the upper teeth when breathing through the mouth will add some moisture to the air breathed in past the vocal cords.  7. Avoid mouth breathing - breathe through your nose . Your nose serves as a natural filter for dust and dirt particles from the air, and as a humidifier to moisten the air. Vocal cords  like to work in moistened, filtered air. When you breathe through your mouth you lose the air filtering and moistening benefits of breathing through the nose. Marland Kitchen Be aware of breathing patterns when sitting quietly (e.g., reading or watching TV). Increase your awareness and try to change habits from mouth breathing to nose breathing during those times.  8. Avoid environmental and/or ingested irritants . Try to avoid smoke-filled and or dusty environments. These items dry out the vocal cords and cause irritation.  9. Use an air filter if the home is dusty, and/or a humidifier if the air is dry  . This will help to maintain clean, humid air to breathe. Remember, this type of air is what the vocal cords like best.

## 2019-12-29 ENCOUNTER — Encounter: Payer: Self-pay | Admitting: Internal Medicine

## 2019-12-29 NOTE — Therapy (Signed)
[facility] [address] [place], [place], [address] Phone: [phone]   Fax:  [phone]  Speech Language Pathology Treatment  Patient Details  Name: Dana Vasquez MRN: [id number] Date of Birth: 1966/07/03 Referring Provider (SLP): Baltazar Apo, MD   Encounter Date: 12/28/2019  End of Session - 12/29/19 0723    Visit Number  4    Number of Visits  13    Date for SLP Re-Evaluation  02/12/20    Authorization Type  BCBS    SLP Start Time  1615    SLP Stop Time   1700    SLP Time Calculation (min)  45 min       Past Medical History:  Diagnosis Date  . Allergy   . Anemia   . Arthritis    knees  AND SHOULDERS  . Asthma    allergy related - rarely uses inhaler  . Bruises easily   . Bunion   . Cancer (HCC)    1995;thyroid cancer, SURGERY AND RADIOACTIVE IODINE  . Ectopic pregnancy   . Euthyroid   . Fatigue   . Fibromyalgia   . GERD (gastroesophageal reflux disease)   . Headache(784.0)    otc med prn - last one 02/2014  . History of blood transfusion 2000   In [place], [place] at [place] - ? 4 units transfused  . Hypothyroidism   . Knee pain   . Plantar fasciitis   . Sleep apnea    uses CPAP  . SVD (spontaneous vaginal delivery)    x 3  . Thyroid disease    thyroidectomy 1995    Past Surgical History:  Procedure Laterality Date  . ABDOMINAL HYSTERECTOMY N/A 04/20/2014   Procedure: Total ABDOMINAL HYSTERECTOMY partial right salpingectomy;  Surgeon: Eldred Manges, MD;  Location: [facility];  Service: Gynecology;  Laterality: N/A;  . BREATH TEK H PYLORI  09/18/2011   Procedure: BREATH TEK H PYLORI;  Surgeon: Pedro Earls, MD;  Location: [facility];  Service: General;  Laterality: N/A;  . BUNIONECTOMY     left;2015  . BUNIONECTOMY    . COLONOSCOPY    . DILATION AND CURETTAGE OF UTERUS  1988   endometriosis  . ECTOPIC PREGNANCY SURGERY    . fallopian tubes removed     left tube removed per  patient - laparotomy  . FOOT SURGERY Right 2019  . FOOT SURGERY Bilateral   . LAPAROSCOPIC GASTRIC SLEEVE RESECTION N/A 04/14/2018   Procedure: LAPAROSCOPIC GASTRIC SLEEVE RESECTION WITH UPPER ENDO AND ERAS PATHWAY;  Surgeon: Johnathan Hausen, MD;  Location: [facility];  Service: General;  Laterality: N/A;  . LYSIS OF ADHESION N/A 04/20/2014   Procedure: LYSIS OF ADHESION;  Surgeon: Eldred Manges, MD;  Location: [facility];  Service: Gynecology;  Laterality: N/A;  . THYROIDECTOMY  1995  . TUBAL LIGATION    . WISDOM TOOTH EXTRACTION      There were no vitals filed for this visit.  Subjective Assessment - 12/28/19 1623    Subjective  "I started Monday. Today my throat is sore."    Currently in Pain?  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 She did not have an option to return part time. Reports mild throat pain and voice is notably hoarse; minimal vocal or articulatory stuttering today (x2 instances). Patient reported she took half of a muscle relaxer that was prescribed for a procedure to see if this would help with her voice. SLP reinforced that pt should consult MD re: anxiety and before taking or changing any medications. Abdominal breathing at rest 95% accuracy; progressed to gentle sustained vowels with average duration 9.4 seconds for /oo/ and 9.9 seconds for /a/. SLP educated re: use of confidential voice, use of a microphone to reduce laryngeal strain/tension. Throat clears x 4 noted today; provided handout on vocal hygiene and emphasized need to minimize throat clearing and find opportunities for vocal rest since she is using her  voice more during the workday.       Assessment / Recommendations / Plan   Plan  Continue with current plan of care      Progression Toward Goals   Progression toward goals  Progressing toward goals       SLP Education - 12/29/19 0723    Education Details  pt should not take or change medications without consulting MD, vocal hygiene    Person(s) Educated  Patient    Methods  Explanation;Handout    Comprehension  Verbalized understanding       SLP Short Term Goals - 12/29/19 [id number]      SLP SHORT TERM GOAL #1   Title  Patient will demonstrate laryngeal relaxation strategies with occasional min cues x sessions.    Baseline  12/28/19    Time  3    Period  Weeks    Status  On-going      SLP SHORT TERM GOAL #2   Title  Pt will use abdominal breathing in 18/20 sentences with rare min A x2 sessions.    Time  3    Period  Weeks    Status  On-going      SLP SHORT TERM GOAL #3   Title  Pt will use abdominal breathing 85% of the time in 8 minutes simple conversation x 2 sessions.    Time  3    Period  Weeks    Status  On-going      SLP SHORT TERM GOAL #4   Title  Patient will sustain vowel phonation for >6 seconds.    Baseline  12/23/19, 12/28/19    Time  4    Period  Weeks    Status  Achieved       SLP Long Term Goals - 12/29/19 0729      SLP LONG TERM GOAL #1   Title  Pt will report using laryngeal relaxation strategy prior to speaking x 4 sessions.    Time  7    Period  Weeks    Status  On-going      SLP LONG TERM GOAL #2   Title  Patient will use abdominal breathing >85% of the time in 15 minutes mod complex conversation x 3 visits.    Time  7    Period  Weeks    Status  On-going      SLP LONG TERM GOAL #3   Title  Patient will improve Voice-Related Quality of Life (VRQOL) score to good (<25) from poor (40).    Time  7  Period  Weeks    Status  On-going       Plan - 12/29/19 0724    Clinical Impression Statement  Vocal quality with mild-mod hoarseness;  minimal/rare stuttering today (x 2). 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 Reports her voice is tired at the end of the day. Provided education re: confidential voice, using a microphone, and vocal hygiene today. 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 Continued training pt in exercises decrease laryngeal and secondary tension, and improve vocal function as well as abdominal breathing. Pitch range is WNL (146.8-207.7 Hz); modal pitch 185 Hz. Continues to increase MPT; 9.4 sec for /oo/ and 9.9 sec for /a/ today. Continue skilled ST to train patient in laryngeal relaxation and other voice techniques to improve voice quality and endurance for communication at work and home.    Speech Therapy Frequency  2x / week    Duration  --   6 weeks or 13 visits   Treatment/Interventions  Oral motor exercises;SLP instruction and feedback;Functional tasks;Compensatory strategies;Patient/family education    Potential to Achieve Goals  Good    Consulted and Agree with Plan of Care  Patient       Patient will benefit from skilled therapeutic intervention in order to improve the following deficits and impairments:   Other voice and resonance disorders    Problem List Patient Active Problem List   Diagnosis Date Noted  . Dyspnea 11/29/2019  . Palpitations 12/28/2018  . PAC (premature atrial contraction) 12/28/2018  . Hypothyroidism 04/27/2018  . Dehydration 04/22/2018  . 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 Euthyroid thyroiditis 03/03/2018  . Arthritis 03/03/2018  . Diverticulosis of colon without diverticulitis 03/03/2018  . Gastroesophageal reflux disease 03/03/2018  . Moderate persistent asthma without complication 99991111  . Encounter for general adult medical examination without abnormal findings 12/12/2016  . 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 Glomerular disorders in diseases classified elsewhere 12/12/2016  . Other long term (current) drug therapy 12/12/2016  . Allergic rhinitis 06/07/2016  . Asthma with acute exacerbation 06/07/2016  . Chronic kidney disease, stage II (mild) 04/28/2016  . Acute recurrent pansinusitis 04/03/2016  . Cough, persistent 04/03/2016  . Hoarseness 04/03/2016  . Sickle cell trait ([facility]) 03/28/2016  . Obstructive sleep apnea 02/29/2016  . Fibroid, uterine 04/20/2014  . Fibroids 04/19/2014  . Menorrhagia 02/12/2012  . Hx of ectopic pregnancy 02/12/2012  . Plantar fasciitis 05/29/2011   Deneise Lever, [place], [place] 12/29/2019, 7:31 AM  [facility] [address] [place], [place], [address] Phone: [phone]   Fax:  [phone]   Name: Dana Vasquez MRN: [id number] Date of Birth: Apr 30, 1966

## 2019-12-30 ENCOUNTER — Other Ambulatory Visit: Payer: Self-pay

## 2019-12-30 ENCOUNTER — Ambulatory Visit: Payer: BC Managed Care – PPO | Admitting: Speech Pathology

## 2019-12-30 DIAGNOSIS — R498 Other voice and resonance disorders: Secondary | ICD-10-CM | POA: Diagnosis not present

## 2019-12-30 NOTE — Patient Instructions (Signed)
Keep finding opportunities to rest your voice on "high use" days. 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 If you notice your voice getting hoarse or scratchy, take a rest break, or take a few minutes to go back to just breathing, and gradually "warm up" by adding speech like we practiced today.

## 2019-12-31 NOTE — Therapy (Signed)
[facility] [address] [place], [place], [address] Phone: [phone]   Fax:  [phone]  Speech Language Pathology Treatment  Patient Details  Name: Dana Vasquez MRN: [id number] Date of Birth: 08-28-1966 Referring Provider (SLP): Baltazar Apo, MD   Encounter Date: 12/30/2019  End of Session - 12/31/19 1840    Visit Number  5    Number of Visits  13    Date for SLP Re-Evaluation  02/12/20    Authorization Type  BCBS    SLP Start Time  [id number]    SLP Stop Time   1700    SLP Time Calculation (min)  43 min    Activity Tolerance  Patient tolerated treatment well       Past Medical History:  Diagnosis Date  . Allergy   . Anemia   . Arthritis    knees  AND SHOULDERS  . Asthma    allergy related - rarely uses inhaler  . Bruises easily   . Bunion   . Cancer (HCC)    1995;thyroid cancer, SURGERY AND RADIOACTIVE IODINE  . Ectopic pregnancy   . Euthyroid   . Fatigue   . Fibromyalgia   . GERD (gastroesophageal reflux disease)   . Headache(784.0)    otc med prn - last one 02/2014  . History of blood transfusion 2000   In [place], [place] at [place] - ? 4 units transfused  . Hypothyroidism   . Knee pain   . Plantar fasciitis   . Sleep apnea    uses CPAP  . SVD (spontaneous vaginal delivery)    x 3  . Thyroid disease    thyroidectomy 1995    Past Surgical History:  Procedure Laterality Date  . ABDOMINAL HYSTERECTOMY N/A 04/20/2014   Procedure: Total ABDOMINAL HYSTERECTOMY partial right salpingectomy;  Surgeon: Eldred Manges, MD;  Location: [facility];  Service: Gynecology;  Laterality: N/A;  . BREATH TEK H PYLORI  09/18/2011   Procedure: BREATH TEK H PYLORI;  Surgeon: Pedro Earls, MD;  Location: [facility];  Service: General;  Laterality: N/A;  . BUNIONECTOMY     left;2015  . BUNIONECTOMY    . COLONOSCOPY    . DILATION AND CURETTAGE OF UTERUS  1988   endometriosis  . ECTOPIC PREGNANCY SURGERY     . fallopian tubes removed     left tube removed per patient - laparotomy  . FOOT SURGERY Right 2019  . FOOT SURGERY Bilateral   . LAPAROSCOPIC GASTRIC SLEEVE RESECTION N/A 04/14/2018   Procedure: LAPAROSCOPIC GASTRIC SLEEVE RESECTION WITH UPPER ENDO AND ERAS PATHWAY;  Surgeon: Johnathan Hausen, MD;  Location: [facility];  Service: General;  Laterality: N/A;  . LYSIS OF ADHESION N/A 04/20/2014   Procedure: LYSIS OF ADHESION;  Surgeon: Eldred Manges, MD;  Location: [facility];  Service: Gynecology;  Laterality: N/A;  . THYROIDECTOMY  1995  . TUBAL LIGATION    . WISDOM TOOTH EXTRACTION      There were no vitals filed for this visit.  Subjective Assessment - 12/30/19 1624    Subjective  "Yesterday was a little better. Today is even more better."    Currently in Pain?  No/denies            ADULT SLP TREATMENT - 12/30/19 1625      General Information   Behavior/Cognition  Alert;Pleasant mood;Cooperative      Treatment Provided   Treatment provided  Cognitive-Linquistic      Cognitive-Linquistic Treatment  Treatment focused on  Voice    Skilled Treatment  Patient reports she is resting her voice whenever possible, cutting down on "talking just to be talking." Reminding herself to "stop and breathe" throughout the day. Vocal quality WNL for 90% of session, although intensity is sub-WNL; average mid 60s dB at 30 cm (70-72 is WNL). 0 throat clears today. Patient maintained vocal quality in 15 min conversation with occasional min A for awareness of laryngeal focus. MPT for /oo/ today was 9.9 seconds. SLP worked with pt on gradually increasing vocal intensity with AB for sustained vowel from mid 60s to low 70s dB.       Assessment / Recommendations / Plan   Plan  Continue with current plan of care      Progression Toward Goals   Progression toward goals  Progressing toward goals         SLP Short Term Goals - 12/31/19 1842      SLP SHORT TERM GOAL #1   Title  Patient will  demonstrate laryngeal relaxation strategies with occasional min cues x sessions.    Baseline  12/28/19 12/30/19    Time  3    Period  Weeks    Status  On-going      SLP SHORT TERM GOAL #2   Title  Pt will use abdominal breathing in 18/20 sentences with rare min A x2 sessions.    Baseline  12/31/19    Time  3    Period  Weeks    Status  On-going      SLP SHORT TERM GOAL #3   Title  Pt will use abdominal breathing 85% of the time in 8 minutes simple conversation x 2 sessions.    Time  3    Period  Weeks    Status  On-going      SLP SHORT TERM GOAL #4   Title  Patient will sustain vowel phonation for >6 seconds.    Baseline  12/23/19, 12/28/19, 12/30/19    Time  4    Period  Weeks    Status  Achieved       SLP Long Term Goals - 12/31/19 1843      SLP LONG TERM GOAL #1   Title  Pt will report using laryngeal relaxation strategy prior to speaking x 4 sessions.    Time  7    Period  Weeks    Status  On-going      SLP LONG TERM GOAL #2   Title  Patient will use abdominal breathing >85% of the time in 15 minutes mod complex conversation x 3 visits.    Time  7    Period  Weeks    Status  On-going      SLP LONG TERM GOAL #3   Title  Patient will improve Voice-Related Quality of Life (VRQOL) score to good (<25) from poor (40).    Time  7    Period  Weeks    Status  On-going       Plan - 12/31/19 1840    Clinical Impression Statement  Vocal quality WNL today, though intensity remains sub-WNL, average low-mid 60s dB. No stuttering today. 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 Continued training pt in exercises decrease laryngeal and secondary tension, and improve vocal function as well as abdominal  breathing. Pitch range is WNL (146.8-207.7 Hz); modal pitch 185 Hz. Continues to increase MPT; 9.9 sec for /oo/ today.  Continue skilled ST to train patient in laryngeal relaxation and other voice techniques to improve voice quality and endurance for communication at work and home.    Speech Therapy Frequency  2x / week    Duration  --   6 weeks or 13 visits   Treatment/Interventions  Oral motor exercises;SLP instruction and feedback;Functional tasks;Compensatory strategies;Patient/family education    Potential to Achieve Goals  Good    Consulted and Agree with Plan of Care  Patient       Patient will benefit from skilled therapeutic intervention in order to improve the following deficits and impairments:   Other voice and resonance disorders    Problem List Patient Active Problem List   Diagnosis Date Noted  . Dyspnea 11/29/2019  . Palpitations 12/28/2018  . PAC (premature atrial contraction) 12/28/2018  . Hypothyroidism 04/27/2018  . Dehydration 04/22/2018  . 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 Euthyroid thyroiditis 03/03/2018  . Arthritis 03/03/2018  . Diverticulosis of colon without diverticulitis 03/03/2018  . Gastroesophageal reflux disease 03/03/2018  . Moderate persistent asthma without complication [id number]  . Encounter for general adult medical examination without abnormal findings 12/12/2016  . 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 Glomerular disorders in diseases classified elsewhere 12/12/2016  . Other long term (current) drug therapy 12/12/2016  . Allergic rhinitis 06/07/2016  . Asthma with acute exacerbation 06/07/2016  . Chronic kidney disease, stage II (mild) 04/28/2016  . Acute recurrent pansinusitis 04/03/2016  . Cough, persistent 04/03/2016  . Hoarseness 04/03/2016  . Sickle cell trait ([place]) 03/28/2016  . Obstructive sleep apnea 02/29/2016  . Fibroid, uterine 04/20/2014  . Fibroids 04/19/2014  . Menorrhagia 02/12/2012  . Hx of ectopic pregnancy  02/12/2012  . Plantar fasciitis 05/29/2011   Deneise Lever, [place], CCC-SLP Speech-Language Pathologist  Aliene Altes 12/31/2019, 6:44 PM  [facility] [address] [place], [place], [address] Phone: [phone]   Fax:  [phone]   Name: Dana Vasquez MRN: [id number] Date of Birth: 1966/08/11

## 2020-01-04 ENCOUNTER — Ambulatory Visit: Payer: BC Managed Care – PPO | Admitting: Speech Pathology

## 2020-01-06 ENCOUNTER — Ambulatory Visit: Payer: BC Managed Care – PPO | Admitting: Speech Pathology

## 2020-01-06 ENCOUNTER — Other Ambulatory Visit: Payer: Self-pay

## 2020-01-06 DIAGNOSIS — R498 Other voice and resonance disorders: Secondary | ICD-10-CM | POA: Diagnosis not present

## 2020-01-06 NOTE — Patient Instructions (Signed)
Practice with abdominal breathing:  Do you have an account? Did you sign in correctly? Please turn your cameras on.  Are you able to see the chat? 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  Is it time for your restroom break? Leave your cameras on while you go to the restroom. Keep your hands to yourself.  Watch your mouth.  Line up. Who's my Freight forwarder of the week? Why didn't you complete your work? Are there any questions?

## 2020-01-07 NOTE — Therapy (Signed)
[facility] [address] [place], [place], [address] Phone: [phone]   Fax:  [phone]  Speech Language Pathology Treatment  Patient Details  Name: Dana Vasquez MRN: [id number] Date of Birth: 02/10/1966 Referring Provider (SLP): Baltazar Apo, MD   Encounter Date: 01/06/2020  End of Session - 01/07/20 0709    Visit Number  6    Number of Visits  13    Date for SLP Re-Evaluation  02/12/20    Authorization Type  BCBS    SLP Start Time  [id number]    SLP Stop Time   [id number]    SLP Time Calculation (min)  42 min       Past Medical History:  Diagnosis Date  . Allergy   . Anemia   . Arthritis    knees  AND SHOULDERS  . Asthma    allergy related - rarely uses inhaler  . Bruises easily   . Bunion   . Cancer (HCC)    1995;thyroid cancer, SURGERY AND RADIOACTIVE IODINE  . Ectopic pregnancy   . Euthyroid   . Fatigue   . Fibromyalgia   . GERD (gastroesophageal reflux disease)   . Headache(784.0)    otc med prn - last one 02/2014  . History of blood transfusion 2000   In [place], [place] at [place] - ? 4 units transfused  . Hypothyroidism   . Knee pain   . Plantar fasciitis   . Sleep apnea    uses CPAP  . SVD (spontaneous vaginal delivery)    x 3  . Thyroid disease    thyroidectomy 1995    Past Surgical History:  Procedure Laterality Date  . ABDOMINAL HYSTERECTOMY N/A 04/20/2014   Procedure: Total ABDOMINAL HYSTERECTOMY partial right salpingectomy;  Surgeon: Eldred Manges, MD;  Location: [facility];  Service: Gynecology;  Laterality: N/A;  . BREATH TEK H PYLORI  09/18/2011   Procedure: BREATH TEK H PYLORI;  Surgeon: Pedro Earls, MD;  Location: [facility];  Service: General;  Laterality: N/A;  . BUNIONECTOMY     left;2015  . BUNIONECTOMY    . COLONOSCOPY    . DILATION AND CURETTAGE OF UTERUS  1988   endometriosis  . ECTOPIC PREGNANCY SURGERY    . fallopian tubes removed     left tube removed per  patient - laparotomy  . FOOT SURGERY Right 2019  . FOOT SURGERY Bilateral   . LAPAROSCOPIC GASTRIC SLEEVE RESECTION N/A 04/14/2018   Procedure: LAPAROSCOPIC GASTRIC SLEEVE RESECTION WITH UPPER ENDO AND ERAS PATHWAY;  Surgeon: Johnathan Hausen, MD;  Location: [facility];  Service: General;  Laterality: N/A;  . LYSIS OF ADHESION N/A 04/20/2014   Procedure: LYSIS OF ADHESION;  Surgeon: Eldred Manges, MD;  Location: [facility];  Service: Gynecology;  Laterality: N/A;  . THYROIDECTOMY  1995  . TUBAL LIGATION    . WISDOM TOOTH EXTRACTION      There were no vitals filed for this visit.  Subjective Assessment - 01/06/20 1617    Subjective  "Just a little irritation."    Currently in Pain?  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 She is implementing voice rest during her non-work hours. Pt had significant laryngeal focus in initial conversation today. Again, no vocal or articulatory stuttering. Pt modified independent with stretching and abdominal breathing at rest. 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 While reading the phrases, SLP paused pt for instruction in resonant voice/forward focus to improve vocal quality. Pt with good success with nasalized hum, nasalized words and progressed quickly to sentences. Able to ID laryngeal vs forward focus with min cues. Carryover noted to short  responses. Subsequently pt was able to use forward resonance while reading her classroom phrases with occasional min A (abdominal breathing/forward focus). 4 throat clears noted today, of which pt was unaware. SLP suggested pt have her husband alert he when he notes this. She reports keeping water by her desk and trying to sip this vs throat clearing during the day.       Assessment / Recommendations / Plan   Plan  Continue with current plan of care      Progression Toward Goals   Progression toward goals  Progressing toward goals       SLP Education - 01/07/20 0709    Education Details  forward vs laryngeal focus, avoid throat clearing    Person(s) Educated  Patient    Methods  Explanation;Verbal cues    Comprehension  Verbalized understanding;Need further instruction       SLP Short Term Goals - 01/07/20 0710      SLP SHORT TERM GOAL #1   Title  Patient will demonstrate laryngeal relaxation strategies with occasional min cues x sessions.    Baseline  12/28/19 12/30/19 01/07/20    Time  2    Period  Weeks    Status  Achieved      SLP SHORT TERM GOAL #2   Title  Pt will use abdominal breathing in 18/20 sentences with rare min A x2 sessions.    Baseline  12/31/19 01/07/20    Time  2    Period  Weeks    Status  Achieved      SLP SHORT TERM GOAL #3   Title  Pt will use abdominal breathing 85% of the time in 8 minutes simple conversation x 2 sessions.    Time  2    Period  Weeks    Status  On-going      SLP SHORT TERM GOAL #4   Title  Patient will sustain vowel phonation for >6 seconds.    Baseline  12/23/19, 12/28/19, 12/30/19    Time  4    Period  Weeks    Status  Achieved       SLP Long Term Goals - 01/07/20 [id number]      SLP LONG TERM GOAL #1   Title  Pt will report using laryngeal relaxation strategy prior to speaking x 4 sessions.    Time  6    Period  Weeks    Status  On-going      SLP LONG TERM GOAL #2   Title  Patient will use abdominal breathing >85% of the time in 15  minutes mod complex conversation x 3 visits.  Time  6    Period  Weeks    Status  On-going      SLP LONG TERM GOAL #3   Title  Patient will improve Voice-Related Quality of Life (VRQOL) score to good (<25) from poor (40).    Time  6    Period  Weeks    Status  On-going         Patient will benefit from skilled therapeutic intervention in order to improve the following deficits and impairments:   Other voice and resonance disorders    Problem List Patient Active Problem List   Diagnosis Date Noted  . Dyspnea 11/29/2019  . Palpitations 12/28/2018  . PAC (premature atrial contraction) 12/28/2018  . Hypothyroidism 04/27/2018  . Dehydration 04/22/2018  . 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 Euthyroid thyroiditis 03/03/2018  . Arthritis 03/03/2018  . Diverticulosis of colon without diverticulitis 03/03/2018  . Gastroesophageal reflux disease 03/03/2018  . Moderate persistent asthma without complication [id number]  . Encounter for general adult medical examination without abnormal findings 12/12/2016  . 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 Glomerular disorders in diseases classified elsewhere 12/12/2016  . Other long term (current) drug therapy 12/12/2016  . Allergic rhinitis 06/07/2016  . Asthma with acute exacerbation 06/07/2016  . Chronic kidney disease, stage II (mild) 04/28/2016  . Acute recurrent pansinusitis 04/03/2016  . Cough, persistent 04/03/2016  . Hoarseness 04/03/2016  . Sickle cell trait ([place]) 03/28/2016  . Obstructive sleep apnea 02/29/2016  . Fibroid, uterine 04/20/2014  . Fibroids 04/19/2014  . Menorrhagia 02/12/2012  . Hx of ectopic pregnancy 02/12/2012  . Plantar fasciitis 05/29/2011   Deneise Lever, [place], [place] 01/07/2020, 7:11 AM  [facility] [address] [place], [place], [address] Phone: [phone]   Fax:  [phone]   Name: Dana Vasquez MRN: [id number] Date of Birth: 08/05/1966

## 2020-01-11 ENCOUNTER — Other Ambulatory Visit: Payer: Self-pay

## 2020-01-11 ENCOUNTER — Ambulatory Visit: Payer: BC Managed Care – PPO | Admitting: Speech Pathology

## 2020-01-11 DIAGNOSIS — R498 Other voice and resonance disorders: Secondary | ICD-10-CM

## 2020-01-11 NOTE — Patient Instructions (Signed)
Stick with practicing the tools that have been helpful for you. You may want to "pre-treat" yourself or do a warm up with your stretching, breathing, maybe the oooos and straw exercises prior to speaking for prolonged period of time.   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   Keep taking vocal rest breaks and following your vocal hygiene guidelines.

## 2020-01-11 NOTE — Therapy (Signed)
[facility] [address] [place], [place], [address] Phone: [phone]   Fax:  [phone]  Speech Language Pathology Treatment and Discharge Summary  Patient Details  Name: Dana Vasquez MRN: [id number] Date of Birth: 1966/05/21 Referring Provider (SLP): Baltazar Apo, MD   Encounter Date: 01/11/2020  End of Session - 01/11/20 1810    Visit Number  7    Number of Visits  13    Date for SLP Re-Evaluation  02/12/20    Authorization Type  [employer]    SLP Start Time  1620    SLP Stop Time   1700    SLP Time Calculation (min)  40 min    Activity Tolerance  Patient tolerated treatment well       Past Medical History:  Diagnosis Date  . Allergy   . Anemia   . Arthritis    knees  AND SHOULDERS  . Asthma    allergy related - rarely uses inhaler  . Bruises easily   . Bunion   . Cancer (HCC)    1995;thyroid cancer, SURGERY AND RADIOACTIVE IODINE  . Ectopic pregnancy   . Euthyroid   . Fatigue   . Fibromyalgia   . GERD (gastroesophageal reflux disease)   . Headache(784.0)    otc med prn - last one 02/2014  . History of blood transfusion 2000   In [place], [place] at [place] - ? 4 units transfused  . Hypothyroidism   . Knee pain   . Plantar fasciitis   . Sleep apnea    uses CPAP  . SVD (spontaneous vaginal delivery)    x 3  . Thyroid disease    thyroidectomy 1995    Past Surgical History:  Procedure Laterality Date  . ABDOMINAL HYSTERECTOMY N/A 04/20/2014   Procedure: Total ABDOMINAL HYSTERECTOMY partial right salpingectomy;  Surgeon: Eldred Manges, MD;  Location: [facility];  Service: Gynecology;  Laterality: N/A;  . BREATH TEK H PYLORI  09/18/2011   Procedure: BREATH TEK H PYLORI;  Surgeon: Pedro Earls, MD;  Location: [facility];  Service: General;  Laterality: N/A;  . BUNIONECTOMY     left;2015  . BUNIONECTOMY    . COLONOSCOPY    . DILATION AND CURETTAGE OF UTERUS  1988   endometriosis  .  ECTOPIC PREGNANCY SURGERY    . fallopian tubes removed     left tube removed per patient - laparotomy  . FOOT SURGERY Right 2019  . FOOT SURGERY Bilateral   . LAPAROSCOPIC GASTRIC SLEEVE RESECTION N/A 04/14/2018   Procedure: LAPAROSCOPIC GASTRIC SLEEVE RESECTION WITH UPPER ENDO AND ERAS PATHWAY;  Surgeon: Johnathan Hausen, MD;  Location: [facility];  Service: General;  Laterality: N/A;  . LYSIS OF ADHESION N/A 04/20/2014   Procedure: LYSIS OF ADHESION;  Surgeon: Eldred Manges, MD;  Location: [facility];  Service: Gynecology;  Laterality: N/A;  . THYROIDECTOMY  1995  . TUBAL LIGATION    . WISDOM TOOTH EXTRACTION      There were no vitals filed for this visit.  Subjective Assessment - 01/11/20 1623    Subjective  "It's a lot of talking but I think I'm handling it a lot better."    Currently in Pain?  No/denies    Pain Score  0-No pain            ADULT SLP TREATMENT - 01/11/20 1623      General Information   Behavior/Cognition  Alert;Pleasant mood;Cooperative  Treatment Provided   Treatment provided  Cognitive-Linquistic      Pain Assessment   Pain Assessment  No/denies pain      Cognitive-Linquistic Treatment   Treatment focused on  Voice    Skilled Treatment  Pt rates voice as 8/10, with 10 being normal voice. Vocal quality and intensity were WNL today, average low 70s dB in conversation, modal pitch 174.6. Patient stated hopeful for d/c today as she is pleased with her current functional level. Reports exercises, particularly stretching and breathing have been helpful. 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 She is also taking opportunities when possible to rest her voice during the day. Patient maintained adequate vocal quality and endurance in 15 minutes mod complex conversation today. Provided pt with handout re: continuing with practices that have been helpful for her voice, particularly abdominal breathing. She  verbalizes carryover of vocal hygiene strategies (down to 1 subtle throat clear today). Administered VRQOL patient-reported outcome measure. Score has improved from raw score of 40 to 12, improved standard score from 25 to 95, which is WNL. MPT for /a/ 10.1 seconds today, improved from 4 seconds at evaluation.       Assessment / Recommendations / Plan   Plan  Discharge SLP treatment due to (comment)   pt pleased with current functional level     Progression Toward Goals   Progression toward goals  Goals met, education completed, patient discharged from SLP   Goals partially met; pt d/c pleased with current function        SLP Short Term Goals - 01/11/20 1818      SLP SHORT TERM GOAL #1   Title  Patient will demonstrate laryngeal relaxation strategies with occasional min cues x sessions.    Baseline  12/28/19 12/30/19 01/07/20    Time  2    Period  Weeks    Status  Achieved      SLP SHORT TERM GOAL #2   Title  Pt will use abdominal breathing in 18/20 sentences with rare min A x2 sessions.    Baseline  12/31/19 01/07/20    Time  2    Period  Weeks    Status  Achieved      SLP SHORT TERM GOAL #3   Title  Pt will use abdominal breathing 85% of the time in 8 minutes simple conversation x 2 sessions.    Baseline  01/11/20    Time  2    Period  Weeks    Status  Partially Met      SLP SHORT TERM GOAL #4   Title  Patient will sustain vowel phonation for >6 seconds.    Baseline  12/23/19, 12/28/19, 12/30/19    Time  4    Period  Weeks    Status  Achieved       SLP Long Term Goals - 01/11/20 1818      SLP LONG TERM GOAL #1   Title  Pt will report using laryngeal relaxation strategy prior to speaking x 4 sessions.    Baseline  01/11/20    Time  6    Period  Weeks    Status  Partially Met      SLP LONG TERM GOAL #2   Title  Patient will use abdominal breathing >85% of the time in 15 minutes mod complex conversation x 3 visits.    Baseline  01/11/20    Time  6  Period  Weeks    Status   Partially Met      SLP LONG TERM GOAL #3   Title  Patient will improve Voice-Related Quality of Life (VRQOL) score to good (<25) from poor (40).    Baseline  raw score 12 today 01/11/20    Time  5    Period  Weeks    Status  Achieved       Plan - 01/11/20 1811    Clinical Impression Statement  Patient maintained adequate vocal quality and endurance today throughout 40 minute session. No stuttering today. She does have occasional vocal fatigue but states she is managing this well. 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 Significant improvement in pt outcome measure (VRQOL) and MPT from eval (see skilled intervention for details). Pt is pleased with her current functional level and requests d/c at this time. Educated re: Museum/gallery exhibitions officer and continuing with exercises that she finds helpful for vocal function, including abdominal breathing.    Speech Therapy Frequency  2x / week    Duration  --   6 weeks or 13 visits   Treatment/Interventions  Oral motor exercises;SLP instruction and feedback;Functional tasks;Compensatory strategies;Patient/family education    Potential to Achieve Goals  Good    Consulted and Agree with Plan of Care  Patient       Patient will benefit from skilled therapeutic intervention in order to improve the following deficits and impairments:   Other voice and resonance disorders    Problem List Patient Active Problem List   Diagnosis Date Noted  . Dyspnea 11/29/2019  . Palpitations 12/28/2018  . PAC (premature atrial contraction) 12/28/2018  . Hypothyroidism 04/27/2018  . Dehydration 04/22/2018  . 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 Euthyroid thyroiditis 03/03/2018  . Arthritis 03/03/2018  . Diverticulosis of colon without diverticulitis 03/03/2018  . Gastroesophageal reflux disease 03/03/2018  . Moderate persistent asthma without complication 48/54/6270  . Encounter for general adult  medical examination without abnormal findings 12/12/2016  . 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 Glomerular disorders in diseases classified elsewhere 12/12/2016  . Other long term (current) drug therapy 12/12/2016  . Allergic rhinitis 06/07/2016  . Asthma with acute exacerbation 06/07/2016  . Chronic kidney disease, stage II (mild) 04/28/2016  . Acute recurrent pansinusitis 04/03/2016  . Cough, persistent 04/03/2016  . Hoarseness 04/03/2016  . Sickle cell trait ([place]) 03/28/2016  . Obstructive sleep apnea 02/29/2016  . Fibroid, uterine 04/20/2014  . Fibroids 04/19/2014  . Menorrhagia 02/12/2012  . Hx of ectopic pregnancy 02/12/2012  . Plantar fasciitis 05/29/2011   SPEECH THERAPY DISCHARGE SUMMARY  Visits from Start of Care: 7  Current functional level related to goals / functional outcomes: See goals above. Patient maintained adequate vocal quality and endurance in mod complex conversation x15 minutes, and throughout 40 minute session today.    Remaining deficits: Vocal fatigue and difficulty projecting her voice. Patient pleased with function and requests dc.    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  Patient goals were partially met. Patient is being discharged due to being pleased with the current functional level.  ?????         Deneise Lever, [place], [place] 01/11/2020, 6:19 PM  [facility]  [address] [place], [place], [address] Phone: [phone]   Fax:  [phone]   Name: Dana Vasquez MRN: [id number] Date of Birth: 11-May-1966

## 2020-01-13 ENCOUNTER — Ambulatory Visit: Payer: BC Managed Care – PPO | Admitting: Speech Pathology

## 2020-01-13 ENCOUNTER — Encounter: Payer: Self-pay | Admitting: Internal Medicine

## 2020-01-13 ENCOUNTER — Ambulatory Visit: Payer: BC Managed Care – PPO | Admitting: Emergency Medicine

## 2020-01-13 ENCOUNTER — Telehealth: Payer: Self-pay | Admitting: Emergency Medicine

## 2020-01-13 NOTE — Telephone Encounter (Signed)
Yes, Ok to write letter for her. Thanks.

## 2020-01-13 NOTE — Telephone Encounter (Signed)
Pt is needing a return to work note from Dr. Lamonte Sakai. The letter needs to state that she does not have any restrictions when it comes to her job duties.  RB - please advise. Thanks.

## 2020-01-13 NOTE — Telephone Encounter (Signed)
Spoke with patient. She stated that the letter needs to state that she can return back to work full duty with no restrictions starting March 22nd. Advised her that I would go ahead and type the letter and have RB to sign it. She wants to come by the office tomorrow to get the letter. Letter will be placed up front.   Nothing further needed at time of call.

## 2020-01-14 ENCOUNTER — Telehealth: Payer: Self-pay | Admitting: Emergency Medicine

## 2020-01-14 NOTE — Telephone Encounter (Signed)
See 3/19 phone note.

## 2020-01-14 NOTE — Telephone Encounter (Signed)
Called and spoke with pt letting her know when we send a letter through [place], this is done without an actual signature on there from provider. Pt asked to have letter mailed to her and also to have one put up front for her as she will try to come by office prior to our office closing to pick letter up. Letter was placed up front yesterday 3/18 by Wallene Dales and one has also been placed in mail for pt. Nothing further needed.

## 2020-01-15 ENCOUNTER — Ambulatory Visit: Payer: BC Managed Care – PPO | Attending: Internal Medicine

## 2020-01-15 DIAGNOSIS — Z23 Encounter for immunization: Secondary | ICD-10-CM

## 2020-01-15 NOTE — Progress Notes (Signed)
   Covid-19 Vaccination Clinic  Name:  [name]    MRN: [id number] DOB: January 25, 1966  01/15/2020  [name] was observed post Covid-19 immunization for 15 minutes without incident. She was provided with Vaccine Information Sheet and instruction to access the V-Safe system.   [name] was instructed to call 911 with any severe reactions post vaccine: Marland Kitchen Difficulty breathing  . Swelling of face and throat  . A fast heartbeat  . A bad rash all over body  . Dizziness and weakness   Immunizations Administered    Name Date Dose VIS Date Route   Pfizer COVID-19 Vaccine 01/15/2020  9:16 AM 0.3 mL 10/08/2019 Intramuscular   Manufacturer: [place]   Lot: [id number]   [place]: [id number]

## 2020-01-18 ENCOUNTER — Encounter: Payer: BC Managed Care – PPO | Admitting: Speech Pathology

## 2020-01-19 ENCOUNTER — Telehealth: Payer: Self-pay | Admitting: *Deleted

## 2020-01-19 NOTE — Telephone Encounter (Signed)
Not really

## 2020-01-19 NOTE — Telephone Encounter (Signed)
I informed pt that Dr. Milinda Pointer states pt would benefit from an appt.

## 2020-01-19 NOTE — Telephone Encounter (Signed)
Pt called states the calcium deposits on her feet have returned and she can only come in to see Dr. Milinda Pointer every 62 days.

## 2020-01-20 ENCOUNTER — Encounter: Payer: BC Managed Care – PPO | Admitting: Speech Pathology

## 2020-01-20 ENCOUNTER — Other Ambulatory Visit (HOSPITAL_COMMUNITY): Payer: BC Managed Care – PPO

## 2020-02-07 LAB — COMPREHENSIVE METABOLIC PANEL
Calcium: 9.2 (ref 8.7–10.7)
GFR calc Af Amer: 74.48
GFR calc non Af Amer: 61.55

## 2020-02-07 LAB — BASIC METABOLIC PANEL
BUN: 20 (ref 4–21)
CO2: 29 — AB (ref 13–22)
Chloride: 104 (ref 99–108)
Creatinine: 1 (ref 0.5–1.1)
Glucose: 80
Potassium: 4.5 (ref 3.4–5.3)
Sodium: 143 (ref 137–147)

## 2020-02-07 LAB — LIPID PANEL
Cholesterol: 199 (ref 0–200)
HDL: 75 — AB (ref 35–70)
LDL Cholesterol: 116
LDl/HDL Ratio: 1.6
Triglycerides: 38 — AB (ref 40–160)

## 2020-02-07 LAB — MICROALBUMIN, URINE: Microalb, Ur: 8.6

## 2020-02-07 LAB — HEMOGLOBIN A1C: Hemoglobin A1C: 5.7

## 2020-02-07 LAB — TSH: TSH: 1.94 (ref 0.41–5.90)

## 2020-02-08 ENCOUNTER — Ambulatory Visit: Payer: BC Managed Care – PPO | Admitting: Podiatry

## 2020-02-08 ENCOUNTER — Other Ambulatory Visit: Payer: Self-pay

## 2020-02-08 DIAGNOSIS — Q828 Other specified congenital malformations of skin: Secondary | ICD-10-CM

## 2020-02-08 DIAGNOSIS — M722 Plantar fascial fibromatosis: Secondary | ICD-10-CM

## 2020-02-08 DIAGNOSIS — M7989 Other specified soft tissue disorders: Secondary | ICD-10-CM | POA: Diagnosis not present

## 2020-02-08 NOTE — Progress Notes (Signed)
She presents today chief complaint of painful lesion beneath the fifth metatarsal base of the right foot.  She states that this is really starting to hurt to her after she complains of a painful area just above the ankle joint just beneath a large transverse scar which occurred over a year ago.  There is a large mass present that bothers her with shoe gear and causes throbbing to the bottom of her foot and her anterior ankle.  She feels that she is nearly completely resolved and recovered from Covid and speech therapy is completed.  ROS: Denies fever chills nausea vomiting muscle aches pains calf pain back pain chest pain shortness of breath.  Objective: Vital signs are stable alert and oriented x3.  Pulses are palpable.  Neurologic sensorium is intact deep tendon reflexes are intact muscle strength is normal symmetrical.  She has a large nonpulsatile mass measuring greater than 2 cm in the anterior ankle just inferior to a transverse serpentine shaped scar which appears to be hypertrophic.  The lesion appears to be over the tibialis anterior tendon questioning whether is a partial tear ganglion or some other type of tumorous mass.  There is radiating pain distally with palpation of the mass.  She also has a solitary porokeratotic lesion to the plantar aspect of the foot just beneath the fifth metatarsal base of the right foot which causes exquisite pain on palpation and bedrest.  This area feels fibrotic beneath the lesion.  Assessment: Soft tissue tumor anterior ankle painful with radiating pain right.  Next is a poor keratoma plantar aspect fifth met base right.  Next is a plantar fibroma this lesion beneath the porokeratotic lesion.  It was also painful.  Plan: Discussed etiology pathology conservative surgical therapy at this point requesting MRI with contrast.  I will also do chemically debride the lesion today after mechanical debridement Cantharone was placed under occlusion to be washed off  thoroughly tomorrow.  I did also inject the fibromatous portion beneath the area of the porokeratotic lesion with 10 mg of Kenalog 5 mg of Marcaine.  I will follow-up with her after her MRI is complete.

## 2020-02-09 ENCOUNTER — Encounter: Payer: BC Managed Care – PPO | Admitting: Adult Health

## 2020-02-09 ENCOUNTER — Telehealth: Payer: Self-pay | Admitting: *Deleted

## 2020-02-09 DIAGNOSIS — M7989 Other specified soft tissue disorders: Secondary | ICD-10-CM

## 2020-02-09 NOTE — Telephone Encounter (Signed)
Prior Authorization obtained via the [employer]. Order # [id number]. Approval faxed to [place] @ [phone] Thanks

## 2020-02-09 NOTE — Telephone Encounter (Signed)
Orders to A. Prevette, CMA for pre-cert, faxed to [place].

## 2020-02-09 NOTE — Telephone Encounter (Signed)
Thank you for doing that for me!!

## 2020-02-09 NOTE — Progress Notes (Signed)
This encounter was created in error - please disregard.

## 2020-02-09 NOTE — Telephone Encounter (Signed)
-----   Message from Rip Harbour, [facility] sent at 02/08/2020  8:37 AM EDT ----- Regarding: MRI MRI ankle with contrast right - evaluate anterior soft tissue mass right ankle - surgical consideration

## 2020-03-13 ENCOUNTER — Ambulatory Visit
Admission: RE | Admit: 2020-03-13 | Discharge: 2020-03-13 | Disposition: A | Discharge status: Home / Self Care | Payer: BC Managed Care – PPO | Source: Ambulatory Visit | Attending: Podiatry | Admitting: Podiatry

## 2020-03-13 ENCOUNTER — Ambulatory Visit: Payer: BC Managed Care – PPO | Admitting: Internal Medicine

## 2020-03-13 DIAGNOSIS — M7989 Other specified soft tissue disorders: Secondary | ICD-10-CM

## 2020-03-13 MED ORDER — GADOBENATE DIMEGLUMINE 529 MG/ML IV SOLN
16.0000 mL | Freq: Once | INTRAVENOUS | Status: AC | PRN
  Administered 2020-03-13: 16 mL via INTRAVENOUS

## 2020-03-14 ENCOUNTER — Ambulatory Visit: Payer: BC Managed Care – PPO | Admitting: Internal Medicine

## 2020-03-14 ENCOUNTER — Encounter: Payer: Self-pay | Admitting: Internal Medicine

## 2020-03-14 ENCOUNTER — Other Ambulatory Visit: Payer: Self-pay

## 2020-03-14 VITALS — BP 124/86 | HR 63 | Temp 98.2°F | Ht 65.0 in | Wt 209.4 lb

## 2020-03-14 DIAGNOSIS — E1169 Type 2 diabetes mellitus with other specified complication: Secondary | ICD-10-CM | POA: Insufficient documentation

## 2020-03-14 DIAGNOSIS — J309 Allergic rhinitis, unspecified: Secondary | ICD-10-CM

## 2020-03-14 DIAGNOSIS — Z6834 Body mass index (BMI) 34.0-34.9, adult: Secondary | ICD-10-CM

## 2020-03-14 DIAGNOSIS — E6609 Other obesity due to excess calories: Secondary | ICD-10-CM

## 2020-03-14 DIAGNOSIS — E756 Lipid storage disorder, unspecified: Secondary | ICD-10-CM

## 2020-03-14 DIAGNOSIS — E7800 Pure hypercholesterolemia, unspecified: Secondary | ICD-10-CM | POA: Insufficient documentation

## 2020-03-14 DIAGNOSIS — I10 Essential (primary) hypertension: Secondary | ICD-10-CM

## 2020-03-14 NOTE — Patient Instructions (Signed)

## 2020-03-15 ENCOUNTER — Encounter: Payer: Self-pay | Admitting: Internal Medicine

## 2020-03-16 ENCOUNTER — Encounter: Payer: Self-pay | Admitting: Internal Medicine

## 2020-03-17 ENCOUNTER — Encounter: Payer: Self-pay | Admitting: Podiatry

## 2020-03-19 NOTE — Progress Notes (Signed)
This visit occurred during the SARS-CoV-2 public health emergency.  Safety protocols were in place, including screening questions prior to the visit, additional usage of staff PPE, and extensive cleaning of exam room while observing appropriate contact time as indicated for disinfecting solutions.  Subjective:     Patient ID: Dana Vasquez , female    DOB: [birth year] , 54 y.o.   MRN: [id number]   Chief Complaint  Patient presents with  . Hypertension  . Allergies    HPI  She is here today for BP check. She reports compliance with meds.   She is concerned about worsening allergy sx. She has runny nose and postnasal drip.   Hypertension This is a chronic problem. The current episode started more than 1 year ago. The problem has been gradually improving since onset. The problem is controlled. Pertinent negatives include no blurred vision, chest pain, palpitations or shortness of breath.     Past Medical History:  Diagnosis Date  . Allergy   . Anemia   . Arthritis    knees  AND SHOULDERS  . Asthma    allergy related - rarely uses inhaler  . Bruises easily   . Bunion   . Cancer (HCC)    1995;thyroid cancer, SURGERY AND RADIOACTIVE IODINE  . Ectopic pregnancy   . Euthyroid   . Fatigue   . Fibromyalgia   . GERD (gastroesophageal reflux disease)   . Headache(784.0)    otc med prn - last one 02/2014  . History of blood transfusion 2000   In [place], [place] at [place] - ? 4 units transfused  . Hypothyroidism   . Knee pain   . Plantar fasciitis   . Sleep apnea    uses CPAP  . SVD (spontaneous vaginal delivery)    x 3  . Thyroid disease    thyroidectomy 1995     Family History  Problem Relation Age of Onset  . Hypertension Mother   . Cancer Mother        uterine  . Cancer Father   . Cancer Brother        stomach; 47  . Early death Brother   . Stroke Maternal Uncle   . Heart disease Maternal Uncle   . Food Allergy Daughter   . Allergic rhinitis Daughter   .  Allergic rhinitis Daughter   . Angioedema Neg Hx   . Asthma Neg Hx   . Eczema Neg Hx   . Immunodeficiency Neg Hx   . Urticaria Neg Hx      Current Outpatient Medications:  .  Ascorbic Acid (VITAMIN C) 1000 MG tablet, Take 1,000 mg by mouth daily. daily, Disp: , Rfl:  .  levothyroxine (SYNTHROID) 112 MCG tablet, Take 1 tablet (112 mcg total) by mouth daily., Disp: 90 tablet, Rfl: 1 .  meloxicam (MOBIC) 15 MG tablet, Take 15 mg by mouth daily., Disp: , Rfl:  .  Multiple Vitamins-Minerals (BARIATRIC MULTIVITAMINS/IRON) CAPS, Take 2 capsules by mouth daily. , Disp: , Rfl:  .  atorvastatin (LIPITOR) 10 MG tablet, Take 10 mg by mouth daily., Disp: , Rfl:  .  CONTOUR NEXT TEST test strip, 3 (three) times daily. as directed, Disp: , Rfl:    Allergies  Allergen Reactions  . Amoxicillin Other (See Comments)    Headache Has patient had a PCN reaction causing immediate rash, facial/tongue/throat swelling, SOB or lightheadedness with hypotension: No Has patient had a PCN reaction causing severe rash involving mucus membranes or skin necrosis: No  Has patient had a PCN reaction that required hospitalization: No Has patient had a PCN reaction occurring within the last 10 years: Unknown If all of the above answers are "NO", then may proceed with Cephalosporin use.   . Nitrofurantoin Monohyd Macro Other (See Comments)    Pt stated she gets a bad yeast infection  . Dexilant [Dexlansoprazole]     "bad stomachache"  . Meloxicam     Upset stomach and stomach pains  . Budesonide Rash  . Formoterol Rash  . Formoterol Fumarate Rash     Review of Systems  Constitutional: Negative.   Eyes: Negative for blurred vision.  Respiratory: Negative.  Negative for shortness of breath.   Cardiovascular: Negative.  Negative for chest pain and palpitations.  Gastrointestinal: Negative.   Neurological: Negative.   Psychiatric/Behavioral: Negative.      Today's Vitals   03/14/20 1618  BP: 124/86  Pulse: 63   Temp: 98.2 F (36.8 C)  TempSrc: Oral  Weight: 209 lb 6.4 oz (95 kg)  Height: 5\' 5"  (1.651 m)   Body mass index is 34.85 kg/m.   Objective:  Physical Exam Vitals and nursing note reviewed.  Constitutional:      Appearance: Normal appearance.  HENT:     Head: Normocephalic and atraumatic.  Cardiovascular:     Rate and Rhythm: Normal rate and regular rhythm.     Heart sounds: Normal heart sounds.  Pulmonary:     Effort: Pulmonary effort is normal.     Breath sounds: Normal breath sounds.  Skin:    General: Skin is warm.  Neurological:     General: No focal deficit present.     Mental Status: She is alert.  Psychiatric:        Mood and Affect: Mood normal.        Behavior: Behavior normal.         Assessment And Plan:     1. Essential hypertension, benign  Chronic, well controlled. She will continue with current meds. She is encouraged to avoid adding salt to her foods.   2. Allergic rhinitis, unspecified seasonality, unspecified trigger  Chronic, advised to take OTC loratadine, fexofenadine or cetirizine. She will let me know if her sx persist.   3. 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 I will check this at her next visit. Pt advised LDL goal is less than 70, will need to restart meds if higher than this. Encouraged to avoid fried foods and to increase her daily activity. She has been seeing Dr. Chalmers Cater for her diabetes. She was recently advised that she no longer needs her care. I will request her most recent labs from Dr. Chalmers Cater and abstract them into her chart.   4. Class 1 obesity due to excess calories with serious comorbidity and body mass index (BMI) of 34.0 to 34.9 in adult  She is encouraged to strive for BMI less than 30 to decrease cardiac risk. Advised to exercise at least 150 minutes per week.   [name], MD    THE PATIENT IS ENCOURAGED TO PRACTICE SOCIAL DISTANCING DUE  TO THE COVID-19 PANDEMIC.

## 2020-03-31 ENCOUNTER — Encounter: Payer: Self-pay | Admitting: Podiatry

## 2020-03-31 ENCOUNTER — Ambulatory Visit: Payer: BC Managed Care – PPO | Admitting: Podiatry

## 2020-03-31 ENCOUNTER — Other Ambulatory Visit: Payer: Self-pay

## 2020-03-31 DIAGNOSIS — S86219A Strain of muscle(s) and tendon(s) of anterior muscle group at lower leg level, unspecified leg, initial encounter: Secondary | ICD-10-CM | POA: Diagnosis not present

## 2020-03-31 NOTE — Progress Notes (Signed)
She presents today for follow-up of her MRI of her right foot states that my right foot continues to really bother me ready to have this thing fixed if it needs to be done.  She states that she is going to [place] in the next couple of weeks to retrieve her grandson and will be coming back.  She like to have it done immediately following that.  Objective: Vital signs are stable alert oriented x3 we have reviewed her past medical history medications allergies surgeries and social history.  Pulses remain palpable right lower extremity MRI does state that there is a tear of the tibialis anterior tendon with retraction which does demonstrate on physical exam as a large mass overlying the anterior ankle medially.  Assessment: Tear of the tibialis anterior tendon.  Plan: We plan on a primary repair of the tibialis anterior tendon with a cast.  We discussed this in great detail today she understands and is amenable to it.  She understands that she may continue to have pain postoperatively we discussed the possible postop complications which may include postop pain bleeding swelling infection recurrence need for further surgery overcorrection under correction loss of digit loss of limb loss of life provide her with information regarding the surgery center as well as anesthesia group.  She understands that she will be nonweightbearing in a cast for a period of up to 4 to 6 weeks I will follow-up with her in the near future for surgery.

## 2020-03-31 NOTE — Patient Instructions (Signed)
Pre-Operative Instructions  Congratulations, you have decided to take an important step towards improving your quality of life.  You can be assured that the doctors and staff at [facility] will be with you every step of the way.  Here are some important things you should know:  1. Plan to be at the surgery center/hospital at least 1 (one) hour prior to your scheduled time, unless otherwise directed by the surgical center/hospital staff.  You must have a responsible adult accompany you, remain during the surgery and drive you home.  Make sure you have directions to the surgical center/hospital to ensure you arrive on time. 2. If you are having surgery at [facility] or [facility], you will need a copy of your medical history and physical form from your family physician within one month prior to the date of surgery. We will give you a form for your primary physician to complete.  3. We make every effort to accommodate the date you request for surgery.  However, there are times where surgery dates or times have to be moved.  We will contact you as soon as possible if a change in schedule is required.   4. No aspirin/ibuprofen for one week before surgery.  If you are on aspirin, any non-steroidal anti-inflammatory medications (Mobic, Aleve, Ibuprofen) should not be taken seven (7) days prior to your surgery.  You make take Tylenol for pain prior to surgery.  5. Medications - If you are taking daily heart and blood pressure medications, seizure, reflux, allergy, asthma, anxiety, pain or diabetes medications, make sure you notify the surgery center/hospital before the day of surgery so they can tell you which medications you should take or avoid the day of surgery. 6. No food or drink after midnight the night before surgery unless directed otherwise by surgical center/hospital staff. 7. No alcoholic beverages 24-hours prior to surgery.  No smoking 24-hours prior or 24-hours after  surgery. 8. Wear loose pants or shorts. They should be loose enough to fit over bandages, boots, and casts. 9. Don't wear slip-on shoes. Sneakers are preferred. 10. Bring your boot with you to the surgery center/hospital.  Also bring crutches or a walker if your physician has prescribed it for you.  If you do not have this equipment, it will be provided for you after surgery. 11. If you have not been contacted by the surgery center/hospital by the day before your surgery, call to confirm the date and time of your surgery. 12. Leave-time from work may vary depending on the type of surgery you have.  Appropriate arrangements should be made prior to surgery with your employer. 13. Prescriptions will be provided immediately following surgery by your doctor.  Fill these as soon as possible after surgery and take the medication as directed. Pain medications will not be refilled on weekends and must be approved by the doctor. 14. Remove nail polish on the operative foot and avoid getting pedicures prior to surgery. 15. Wash the night before surgery.  The night before surgery wash the foot and leg well with water and the antibacterial soap provided. Be sure to pay special attention to beneath the toenails and in between the toes.  Wash for at least three (3) minutes. Rinse thoroughly with water and dry well with a towel.  Perform this wash unless told not to do so by your physician.  Enclosed: 1 Ice pack (please put in freezer the night before surgery)   1 Hibiclens skin cleaner     Pre-op instructions  If you have any questions regarding the instructions, please do not hesitate to call our office.  [place]: [address], [place], [place] [address] -- [phone]  [place]: [address] [place], [place] [address] -- [phone]  [place]: [address], [place], [place] [address] -- [phone]   Website: [email or web address] 

## 2020-04-07 ENCOUNTER — Telehealth: Payer: Self-pay

## 2020-04-07 NOTE — Telephone Encounter (Addendum)
DOS 05/19/2020  REPAIR TIBIALIS ANTENOR TENDON RT - 28086  [employer] ST EFFECTIVE DATE - 10/29/2019  PLAN DEDUCTIBLE - $1250.00 W/ $0.00 REMAINING OUT OF POCKET - $4890.00 W/ $5927.63 REMAINING COPAY $0.00 COINSURANCE - 20% NO AUTH REQUIRED

## 2020-04-10 ENCOUNTER — Encounter: Payer: Self-pay | Admitting: Internal Medicine

## 2020-04-19 ENCOUNTER — Other Ambulatory Visit: Payer: Self-pay | Admitting: Podiatry

## 2020-04-19 MED ORDER — OXYCODONE-ACETAMINOPHEN 10-325 MG PO TABS: 1.0000 | ORAL_TABLET | Freq: Three times a day (TID) | ORAL | 0 refills | Status: AC | PRN

## 2020-04-19 MED ORDER — CLINDAMYCIN HCL 150 MG PO CAPS
150.0000 mg | ORAL_CAPSULE | Freq: Three times a day (TID) | ORAL | 0 refills | Status: DC
Start: 2020-04-19 — End: 2020-06-08

## 2020-04-19 MED ORDER — ONDANSETRON HCL 4 MG PO TABS: 4.0000 mg | ORAL_TABLET | Freq: Three times a day (TID) | ORAL | 0 refills | Status: DC | PRN

## 2020-04-20 ENCOUNTER — Telehealth: Payer: Self-pay | Admitting: Podiatry

## 2020-04-20 NOTE — Telephone Encounter (Signed)
I spoke with Dr. Milinda Pointer and he recommended benadryl or Claritin. Left message informing pt of Dr. Stephenie Acres recommendation.

## 2020-04-20 NOTE — Telephone Encounter (Signed)
Pt called and stated hses having surgry in the am and she feels like shes having some sinus issues pt wanted to know if she can take anything since shes having surgry in the am

## 2020-04-21 ENCOUNTER — Encounter: Payer: Self-pay | Admitting: Internal Medicine

## 2020-04-21 ENCOUNTER — Ambulatory Visit (INDEPENDENT_AMBULATORY_CARE_PROVIDER_SITE_OTHER)
Admission: RE | Admit: 2020-04-21 | Discharge: 2020-04-21 | Disposition: A | Discharge status: Home / Self Care | Payer: BC Managed Care – PPO | Source: Ambulatory Visit

## 2020-04-21 DIAGNOSIS — R0982 Postnasal drip: Secondary | ICD-10-CM

## 2020-04-21 DIAGNOSIS — R0981 Nasal congestion: Secondary | ICD-10-CM

## 2020-04-21 MED ORDER — CETIRIZINE HCL 10 MG PO TABS
10.0000 mg | ORAL_TABLET | Freq: Every day | ORAL | 0 refills | Status: DC
Start: 2020-04-21 — End: 2020-07-17

## 2020-04-21 NOTE — ED Provider Notes (Signed)
Virtual Visit via Video Note:  Dana Vasquez  initiated request for Telemedicine visit with [facility] team. I connected with Dana Vasquez  on 04/21/2020 at 12:16 PM  for a synchronized telemedicine visit using a video enabled HIPPA compliant telemedicine application. I verified that I am speaking with Dana Vasquez  using two identifiers. Orvan July, NP  was physically located in a [facility] Urgent care site and Dana Vasquez was located at a different location.   The limitations of evaluation and management by telemedicine as well as the availability of in-person appointments were discussed. Patient was informed that she  may incur a bill ( including co-pay) for this virtual visit encounter. Zulma E Tay-Dolder  expressed understanding and gave verbal consent to proceed with virtual visit.     History of Present Illness:Dana Vasquez  is a 54 y.o. female presents with runny nose, PND, sore throat, headache, sinus pressure, body ahces. Symptoms started on Tuesday. Also some sneezing. Hx of allergies. Took sudafed 2 nights ago and today. Not currently taking any allergy meds. No fever, cough, chest congestion. No known sick contacts. She has been covid vaccinated.   Past Medical History:  Diagnosis Date  . Allergy   . Anemia   . Arthritis    knees  AND SHOULDERS  . Asthma    allergy related - rarely uses inhaler  . Bruises easily   . Bunion   . Cancer (HCC)    1995;thyroid cancer, SURGERY AND RADIOACTIVE IODINE  . Ectopic pregnancy   . Euthyroid   . Fatigue   . Fibromyalgia   . GERD (gastroesophageal reflux disease)   . Headache(784.0)    otc med prn - last one 02/2014  . History of blood transfusion 2000   In [place], [place] at [place] - ? 4 units transfused  . Hypothyroidism   . Knee pain   . Plantar fasciitis   . Sleep apnea    uses CPAP  . SVD (spontaneous vaginal delivery)    x 3  . Thyroid disease    thyroidectomy 1995    Allergies   Allergen Reactions  . Amoxicillin Other (See Comments)    Headache Has patient had a PCN reaction causing immediate rash, facial/tongue/throat swelling, SOB or lightheadedness with hypotension: No Has patient had a PCN reaction causing severe rash involving mucus membranes or skin necrosis: No Has patient had a PCN reaction that required hospitalization: No Has patient had a PCN reaction occurring within the last 10 years: Unknown If all of the above answers are "NO", then may proceed with Cephalosporin use.   . Nitrofurantoin Monohyd Macro Other (See Comments)    Pt stated she gets a bad yeast infection  . Dexilant [Dexlansoprazole]     "bad stomachache"  . Meloxicam     Upset stomach and stomach pains  . Budesonide Rash  . Formoterol Rash  . Formoterol Fumarate Rash        Observations/Objective:VITALS: Per patient if applicable, see vitals. GENERAL: Alert, appears well and in no acute distress. HEENT: Atraumatic, conjunctiva clear, no obvious abnormalities on inspection of external nose and ears. NECK: Normal movements of the head and neck. CARDIOPULMONARY: No increased WOB. Speaking in clear sentences. I:E ratio WNL.  MS: Moves all visible extremities without noticeable abnormality. PSYCH: Pleasant and cooperative, well-groomed. Speech normal rate and rhythm. Affect is appropriate. Insight and judgement are appropriate. Attention is focused, linear, and appropriate.  NEURO: CN grossly intact.  Oriented as arrived to appointment on time with no prompting. Moves both UE equally.  SKIN: No obvious lesions, wounds, erythema, or cyanosis noted on face or hands.     Assessment and Plan:allergies vs viral sinusitis Treating with cetrizine. Sudafed as needed Follow up as needed for continued or worsening symptoms    Follow Up Instructions: Follow up as needed for continued or worsening symptoms     I discussed the assessment and treatment plan with the patient. The patient  was provided an opportunity to ask questions and all were answered. The patient agreed with the plan and demonstrated an understanding of the instructions.   The patient was advised to call back or seek an in-person evaluation if the symptoms worsen or if the condition fails to improve as anticipated.    Orvan July, NP  04/21/2020 12:16 PM         Orvan July, NP 04/21/20 1216

## 2020-04-21 NOTE — Discharge Instructions (Addendum)
Take the cetrizine as prescribed  Follow up as needed for continued or worsening symptoms

## 2020-04-24 ENCOUNTER — Encounter: Payer: Self-pay | Admitting: Podiatry

## 2020-04-26 ENCOUNTER — Ambulatory Visit: Payer: BC Managed Care – PPO | Admitting: Internal Medicine

## 2020-04-26 ENCOUNTER — Other Ambulatory Visit: Payer: Self-pay

## 2020-04-26 ENCOUNTER — Encounter: Payer: Self-pay | Admitting: Internal Medicine

## 2020-04-26 VITALS — BP 114/80 | HR 81 | Temp 98.2°F | Ht 65.0 in | Wt 208.8 lb

## 2020-04-26 DIAGNOSIS — J0100 Acute maxillary sinusitis, unspecified: Secondary | ICD-10-CM

## 2020-04-26 DIAGNOSIS — E7800 Pure hypercholesterolemia, unspecified: Secondary | ICD-10-CM

## 2020-04-26 MED ORDER — AZITHROMYCIN 250 MG PO TABS
ORAL_TABLET | ORAL | 0 refills | Status: DC
Start: 2020-04-26 — End: 2020-06-22

## 2020-04-27 ENCOUNTER — Encounter: Payer: BC Managed Care – PPO | Admitting: Podiatry

## 2020-05-01 NOTE — Progress Notes (Signed)
This visit occurred during the SARS-CoV-2 public health emergency.  Safety protocols were in place, including screening questions prior to the visit, additional usage of staff PPE, and extensive cleaning of exam room while observing appropriate contact time as indicated for disinfecting solutions.  Subjective:     Patient ID: Dana Vasquez , female    DOB: 06-29-1966 , 54 y.o.   MRN: [id number]   Chief Complaint  Patient presents with  . sinus infection    HPI  She presents today for further evaluation of sinus congestion. She went to Urgent Care on 6/25 b/c her foot surgery was cancelled. She reports anesthesiologist felt that sinus infection could travel to her foot and that surgery should be postponed. Urgent care diagnosed her with viral infection and she was discharged on cetirizine and Sudafed. She reports her sx have persisted despite this treatment.     Past Medical History:  Diagnosis Date  . Allergy   . Anemia   . Arthritis    knees  AND SHOULDERS  . Asthma    allergy related - rarely uses inhaler  . Bruises easily   . Bunion   . Cancer (HCC)    1995;thyroid cancer, SURGERY AND RADIOACTIVE IODINE  . Ectopic pregnancy   . Euthyroid   . Fatigue   . Fibromyalgia   . GERD (gastroesophageal reflux disease)   . Headache(784.0)    otc med prn - last one 02/2014  . History of blood transfusion 2000   In [place], [place] at [place] - ? 4 units transfused  . Hypothyroidism   . Knee pain   . Plantar fasciitis   . Sleep apnea    uses CPAP  . SVD (spontaneous vaginal delivery)    x 3  . Thyroid disease    thyroidectomy 1995     Family History  Problem Relation Age of Onset  . Hypertension Mother   . Cancer Mother        uterine  . Cancer Father   . Cancer Brother        stomach; 42  . Early death Brother   . Stroke Maternal Uncle   . Heart disease Maternal Uncle   . Food Allergy Daughter   . Allergic rhinitis Daughter   . Allergic rhinitis Daughter   .  Angioedema Neg Hx   . Asthma Neg Hx   . Eczema Neg Hx   . Immunodeficiency Neg Hx   . Urticaria Neg Hx      Current Outpatient Medications:  .  cetirizine (ZYRTEC) 10 MG tablet, Take 1 tablet (10 mg total) by mouth daily., Disp: 30 tablet, Rfl: 0 .  CONTOUR NEXT TEST test strip, 3 (three) times daily. as directed, Disp: , Rfl:  .  levothyroxine (SYNTHROID) 112 MCG tablet, Take 1 tablet (112 mcg total) by mouth daily., Disp: 90 tablet, Rfl: 1 .  Multiple Vitamins-Minerals (BARIATRIC MULTIVITAMINS/IRON) CAPS, Take 2 capsules by mouth daily. , Disp: , Rfl:  .  atorvastatin (LIPITOR) 10 MG tablet, Take 10 mg by mouth daily. (Patient not taking: Reported on 04/25/2020), Disp: , Rfl:  .  azithromycin (ZITHROMAX) 250 MG tablet, Dispense as a Zpak, use as directed, Disp: 6 each, Rfl: 0 .  clindamycin (CLEOCIN) 150 MG capsule, Take 1 capsule (150 mg total) by mouth 3 (three) times daily. (Patient not taking: Reported on 04/25/2020), Disp: 30 capsule, Rfl: 0 .  meloxicam (MOBIC) 15 MG tablet, Take 15 mg by mouth daily. (Patient not taking: Reported on  04/25/2020), Disp: , Rfl:  .  ondansetron (ZOFRAN) 4 MG tablet, Take 1 tablet (4 mg total) by mouth every 8 (eight) hours as needed. (Patient not taking: Reported on 04/25/2020), Disp: 20 tablet, Rfl: 0   Allergies  Allergen Reactions  . Amoxicillin Other (See Comments)    Headache Has patient had a PCN reaction causing immediate rash, facial/tongue/throat swelling, SOB or lightheadedness with hypotension: No Has patient had a PCN reaction causing severe rash involving mucus membranes or skin necrosis: No Has patient had a PCN reaction that required hospitalization: No Has patient had a PCN reaction occurring within the last 10 years: Unknown If all of the above answers are "NO", then may proceed with Cephalosporin use.   . Nitrofurantoin Monohyd Macro Other (See Comments)    Pt stated she gets a bad yeast infection  . Dexilant [Dexlansoprazole]      "bad stomachache"  . Meloxicam     Upset stomach and stomach pains  . Budesonide Rash  . Formoterol Rash  . Formoterol Fumarate Rash     Review of Systems  Constitutional: Negative.   HENT: Positive for congestion, sinus pressure and sinus pain.   Respiratory: Negative.   Cardiovascular: Negative.   Gastrointestinal: Negative.   Neurological: Negative.   Psychiatric/Behavioral: Negative.      Today's Vitals   04/26/20 1202  BP: 114/80  Pulse: 81  Temp: 98.2 F (36.8 C)  TempSrc: Oral  Weight: 208 lb 12.8 oz (94.7 kg)  Height: 5\' 5"  (1.651 m)   Body mass index is 34.75 kg/m.   Objective:  Physical Exam Vitals and nursing note reviewed.  Constitutional:      Appearance: Normal appearance.  HENT:     Head: Normocephalic and atraumatic.     Comments: Maxillary sinus tenderness to percussion Cardiovascular:     Rate and Rhythm: Normal rate and regular rhythm.     Heart sounds: Normal heart sounds.  Pulmonary:     Effort: Pulmonary effort is normal.     Breath sounds: Normal breath sounds.  Skin:    General: Skin is warm.  Neurological:     General: No focal deficit present.     Mental Status: She is alert.  Psychiatric:        Mood and Affect: Mood normal.        Behavior: Behavior normal.         Assessment And Plan:     1. Acute non-recurrent maxillary sinusitis  She reports allergy to amoxicillin.  She was given rx azithromycin and encouraged to take full course of abx.   2. Pure hypercholesterolemia  Chronic. She has been off of cholesterol medications for several months. Does not wish to resume meds if not needed.  Agrees to checking chol levels at next visit and developing treatment plan with newer results.   Dana Greenland, MD    THE PATIENT IS ENCOURAGED TO PRACTICE SOCIAL DISTANCING DUE TO THE COVID-19 PANDEMIC.

## 2020-05-04 ENCOUNTER — Encounter: Payer: BC Managed Care – PPO | Admitting: Podiatry

## 2020-05-05 ENCOUNTER — Telehealth: Payer: Self-pay | Admitting: Podiatry

## 2020-05-05 NOTE — Telephone Encounter (Signed)
Pt called stating that she is having pain and her surgry is not till 05/26/20 wanted to know what to do until then for the foot and how to keep herself safe please assist

## 2020-05-05 NOTE — Telephone Encounter (Signed)
Pt states she is having pain and wanted to know what to do. I told pt that she should rest, ice 3-4 times daily and wear the sleeve brace during the day and take it off at night and rest, take OTC regular strength tylenol for the discomfort since she has stomach problems with antiinflammatory medications. Pt states understanding.

## 2020-05-10 NOTE — Telephone Encounter (Signed)
Wear her cam walker and keep her foot elevated with compression.  She can continue to ice the area.

## 2020-05-15 ENCOUNTER — Other Ambulatory Visit: Payer: BC Managed Care – PPO

## 2020-05-18 ENCOUNTER — Encounter: Payer: BC Managed Care – PPO | Admitting: Podiatry

## 2020-05-22 ENCOUNTER — Encounter: Payer: Self-pay | Admitting: Podiatry

## 2020-05-24 ENCOUNTER — Other Ambulatory Visit: Payer: Self-pay | Admitting: Podiatry

## 2020-05-24 MED ORDER — ONDANSETRON HCL 4 MG PO TABS: 4.0000 mg | ORAL_TABLET | Freq: Three times a day (TID) | ORAL | 0 refills | Status: DC | PRN

## 2020-05-24 MED ORDER — OXYCODONE-ACETAMINOPHEN 10-325 MG PO TABS: 1.0000 | ORAL_TABLET | Freq: Three times a day (TID) | ORAL | 0 refills | Status: AC | PRN

## 2020-05-24 MED ORDER — CLINDAMYCIN HCL 150 MG PO CAPS
150.0000 mg | ORAL_CAPSULE | Freq: Three times a day (TID) | ORAL | 0 refills | Status: DC
Start: 2020-05-24 — End: 2020-06-22

## 2020-05-25 ENCOUNTER — Encounter: Payer: BC Managed Care – PPO | Admitting: Podiatry

## 2020-05-25 ENCOUNTER — Encounter: Payer: Self-pay | Admitting: Podiatry

## 2020-05-26 DIAGNOSIS — M722 Plantar fascial fibromatosis: Secondary | ICD-10-CM

## 2020-05-26 DIAGNOSIS — S86211A Strain of muscle(s) and tendon(s) of anterior muscle group at lower leg level, right leg, initial encounter: Secondary | ICD-10-CM | POA: Diagnosis not present

## 2020-05-29 ENCOUNTER — Telehealth: Payer: Self-pay | Admitting: *Deleted

## 2020-05-29 DIAGNOSIS — Z9889 Other specified postprocedural states: Secondary | ICD-10-CM

## 2020-05-29 DIAGNOSIS — S86219A Strain of muscle(s) and tendon(s) of anterior muscle group at lower leg level, unspecified leg, initial encounter: Secondary | ICD-10-CM

## 2020-05-29 DIAGNOSIS — M7989 Other specified soft tissue disorders: Secondary | ICD-10-CM

## 2020-05-29 NOTE — Telephone Encounter (Signed)
Pt requested knee scooter to offload 05/26/2020 surgery foot.

## 2020-05-29 NOTE — Telephone Encounter (Signed)
Faxed orders, clinicals and demographics to [employer] on required form and emailed.

## 2020-05-31 ENCOUNTER — Encounter: Payer: Self-pay | Admitting: Podiatry

## 2020-06-01 ENCOUNTER — Encounter: Payer: BC Managed Care – PPO | Admitting: Podiatry

## 2020-06-01 ENCOUNTER — Ambulatory Visit (INDEPENDENT_AMBULATORY_CARE_PROVIDER_SITE_OTHER): Payer: BC Managed Care – PPO | Admitting: Podiatry

## 2020-06-01 ENCOUNTER — Other Ambulatory Visit: Payer: Self-pay

## 2020-06-01 ENCOUNTER — Encounter: Payer: Self-pay | Admitting: Podiatry

## 2020-06-01 VITALS — BP 145/83 | HR 79 | Temp 97.5°F

## 2020-06-01 DIAGNOSIS — M7989 Other specified soft tissue disorders: Secondary | ICD-10-CM

## 2020-06-01 DIAGNOSIS — S86219A Strain of muscle(s) and tendon(s) of anterior muscle group at lower leg level, unspecified leg, initial encounter: Secondary | ICD-10-CM

## 2020-06-01 DIAGNOSIS — Z9889 Other specified postprocedural states: Secondary | ICD-10-CM

## 2020-06-02 NOTE — Progress Notes (Signed)
She presents today for her first postop visit date of surgery 05/26/2020 tibialis anterior tendon repair with cast application right below-knee.  States that is doing okay is been hurting a lot so been taking the pain medicine.  She presents today with her husband.  She denies fever chills nausea vomit muscle aches pains calf pain back pain chest pain shortness of breath.  Objective: Vital signs are stable alert oriented x3 foot is in a cast mildly flattened on the bottom it appears that she has been standing on it but it does not appear to be dirty.  Her toes are not dirty.  She is good range of motion of the toes she has good sensation of the toes and the cast is loose proximally but snug around the ankle.  Assessment: Well-healing surgical foot for what we can see she demonstrates no systemic signs of infection.  Plan: At this point we will follow-up with her in 1 week for cast change and dressing change.

## 2020-06-06 ENCOUNTER — Encounter: Payer: Self-pay | Admitting: Podiatry

## 2020-06-08 ENCOUNTER — Encounter: Payer: Self-pay | Admitting: Podiatry

## 2020-06-08 ENCOUNTER — Other Ambulatory Visit: Payer: Self-pay

## 2020-06-08 ENCOUNTER — Ambulatory Visit (INDEPENDENT_AMBULATORY_CARE_PROVIDER_SITE_OTHER): Payer: BC Managed Care – PPO | Admitting: Podiatry

## 2020-06-08 DIAGNOSIS — S86219A Strain of muscle(s) and tendon(s) of anterior muscle group at lower leg level, unspecified leg, initial encounter: Secondary | ICD-10-CM

## 2020-06-08 DIAGNOSIS — M7989 Other specified soft tissue disorders: Secondary | ICD-10-CM | POA: Diagnosis not present

## 2020-06-08 DIAGNOSIS — Z9889 Other specified postprocedural states: Secondary | ICD-10-CM

## 2020-06-08 NOTE — Progress Notes (Signed)
She presents today for second postop visit date of surgery 05/26/2020 tibialis anterior tendon repair with cast.  States that is doing better this week than it did last week though she did feel a pop sensation on the plantar lateral aspect of the right foot.  She is concerned that it may be the plantar fasciitis.  Objective: Vital signs are stable she is alert oriented x3 presents with her knee scooter today cast is intact once removed demonstrates dry sterile dressing intact was removed demonstrates minimal edema no erythema cellulitis drainage or odor incision site is very hard to even see of the wound is healing very nicely.  There is minimal edema.  She has good range of motion dorsiflexion plantarflexion with some tenderness.  She has no pain on palpation of the plantar lateral aspect of the right foot.  Assessment: Well-healing surgical foot right.  Plan: Redressed today dressed a compressive dressing reapplied a new cast right.  We will follow-up with her in 2 weeks for cast removal.

## 2020-06-09 DIAGNOSIS — M79676 Pain in unspecified toe(s): Secondary | ICD-10-CM

## 2020-06-20 ENCOUNTER — Encounter: Payer: BC Managed Care – PPO | Admitting: Podiatry

## 2020-06-22 ENCOUNTER — Ambulatory Visit (INDEPENDENT_AMBULATORY_CARE_PROVIDER_SITE_OTHER): Payer: BC Managed Care – PPO | Admitting: Podiatry

## 2020-06-22 ENCOUNTER — Other Ambulatory Visit: Payer: Self-pay

## 2020-06-22 ENCOUNTER — Encounter: Payer: Self-pay | Admitting: Podiatry

## 2020-06-22 DIAGNOSIS — M7989 Other specified soft tissue disorders: Secondary | ICD-10-CM | POA: Diagnosis not present

## 2020-06-22 DIAGNOSIS — S86219A Strain of muscle(s) and tendon(s) of anterior muscle group at lower leg level, unspecified leg, initial encounter: Secondary | ICD-10-CM

## 2020-06-22 DIAGNOSIS — Z9889 Other specified postprocedural states: Secondary | ICD-10-CM

## 2020-06-22 NOTE — Progress Notes (Signed)
She presents today to have her cast removed date of surgery is 05/26/2020 she states that she is doing very well she says the pain is better all the time.  She denies fever chills nausea vomiting muscle aches pains calf pain back pain chest pain shortness of breath.  Objective: Vital signs are stable alert oriented x3 there is no erythema edema cellulitis drainage or odor once the cast was removed.  Incision site is gone on to heal uneventfully there is some postinflammatory hyperpigmentation present she has great range of motion dorsiflexion plantarflexion against resistance and she states that the pain is no longer proximal present.  Assessment: Well-healing surgical foot.  Plan: Removed all of the stitches today and placed her in a cam walker and a compression anklet to be worn at all times short of a shower and while she is exercising.  She is to continue nonweightbearing status utilizing her knee scooter and crutches I will follow-up with her in 2 weeks at which time we will progress to partial weightbearing provided she is doing well.

## 2020-06-26 ENCOUNTER — Telehealth: Payer: Self-pay | Admitting: Podiatry

## 2020-06-26 ENCOUNTER — Ambulatory Visit: Payer: BC Managed Care – PPO | Admitting: Internal Medicine

## 2020-06-26 NOTE — Telephone Encounter (Signed)
Patient called. Had surgery 4 weeks ago. States tall boot is too heavy to walk and wants to know if she can get a short boot or some other device. Call back number is [phone].

## 2020-06-28 ENCOUNTER — Telehealth: Payer: Self-pay | Admitting: Podiatry

## 2020-06-28 NOTE — Telephone Encounter (Signed)
Pt called and stated that her boot is too heavy for her foot and she wanted to switch it out.

## 2020-06-29 NOTE — Telephone Encounter (Signed)
I spoke with patient and recommended that she needs to stay in tall boot until her next follow up appt with Dr Milinda Pointer because of the surgery that she had.  She verbalized understanding.

## 2020-07-04 ENCOUNTER — Encounter: Payer: BC Managed Care – PPO | Admitting: Podiatry

## 2020-07-06 ENCOUNTER — Ambulatory Visit (INDEPENDENT_AMBULATORY_CARE_PROVIDER_SITE_OTHER): Payer: BC Managed Care – PPO | Admitting: Podiatry

## 2020-07-06 ENCOUNTER — Encounter: Payer: Self-pay | Admitting: Podiatry

## 2020-07-06 ENCOUNTER — Other Ambulatory Visit: Payer: Self-pay

## 2020-07-06 DIAGNOSIS — S86219A Strain of muscle(s) and tendon(s) of anterior muscle group at lower leg level, unspecified leg, initial encounter: Secondary | ICD-10-CM

## 2020-07-06 DIAGNOSIS — M7989 Other specified soft tissue disorders: Secondary | ICD-10-CM

## 2020-07-06 DIAGNOSIS — Z9889 Other specified postprocedural states: Secondary | ICD-10-CM

## 2020-07-06 NOTE — Progress Notes (Signed)
She presents today for postop visit date of surgery 05/27/2019 110 tibialis anterior tendon repair with cast right.  She denies fever chills nausea vomiting muscle aches and pains states that her right knee has been bothering her.  Objective: Vital signs are stable she is alert oriented x3 there is minimal edema no erythema cellulitis drainage at incision site is healing very nicely she has good inversion and dorsiflexion.  Assessment: Well-healing surgical foot.  Plan: Encouraged range of motion exercises explained to her that she does not have to wear the cam walker at all times however I do want her to start partial weightbearing and progress to full weightbearing by the time I see her in 2 weeks.  I will see her at that time.

## 2020-07-17 ENCOUNTER — Encounter: Payer: Self-pay | Admitting: Internal Medicine

## 2020-07-17 ENCOUNTER — Other Ambulatory Visit: Payer: Self-pay

## 2020-07-17 ENCOUNTER — Ambulatory Visit: Payer: BC Managed Care – PPO | Admitting: Internal Medicine

## 2020-07-17 VITALS — BP 130/74 | HR 72 | Temp 98.5°F | Ht 64.0 in | Wt 219.2 lb

## 2020-07-17 DIAGNOSIS — J029 Acute pharyngitis, unspecified: Secondary | ICD-10-CM

## 2020-07-17 DIAGNOSIS — Z79899 Other long term (current) drug therapy: Secondary | ICD-10-CM

## 2020-07-17 DIAGNOSIS — R220 Localized swelling, mass and lump, head: Secondary | ICD-10-CM | POA: Diagnosis not present

## 2020-07-17 DIAGNOSIS — Z6837 Body mass index (BMI) 37.0-37.9, adult: Secondary | ICD-10-CM

## 2020-07-17 DIAGNOSIS — E7800 Pure hypercholesterolemia, unspecified: Secondary | ICD-10-CM | POA: Diagnosis not present

## 2020-07-17 DIAGNOSIS — Z1159 Encounter for screening for other viral diseases: Secondary | ICD-10-CM

## 2020-07-17 LAB — POCT RAPID STREP A (OFFICE): Rapid Strep A Screen: NEGATIVE

## 2020-07-17 MED ORDER — CETIRIZINE HCL 10 MG PO TABS: 10.0000 mg | ORAL_TABLET | Freq: Every day | ORAL | 2 refills | Status: DC

## 2020-07-17 NOTE — Progress Notes (Signed)
I,Tianna Badgett,acting as a Education administrator for [name], MD.,have documented all relevant documentation on the behalf of [name], MD,as directed by  [name], MD while in the presence of [name], MD.  This visit occurred during the SARS-CoV-2 public health emergency.  Safety protocols were in place, including screening questions prior to the visit, additional usage of staff PPE, and extensive cleaning of exam room while observing appropriate contact time as indicated for disinfecting solutions.  Subjective:     Patient ID: Dana Vasquez , female    DOB: [birth year] , 54 y.o.   MRN: [id number]   Chief Complaint  Patient presents with  . Hyperlipidemia    HPI  Patient presents for cholesterol check. She is not taking any medications at this time.   She also has concerns of sore throat and tongue swelling.  She reports the first episode occurred about 3 weeks ago. She is not sure what may have triggered her sx. She called EMS - they arrived and suggested that she take Benadryl. She has had recurrence of sx - again, not sure what triggered her sx.     Past Medical History:  Diagnosis Date  . Allergy   . Anemia   . Arthritis    knees  AND SHOULDERS  . Asthma    allergy related - rarely uses inhaler  . Bruises easily   . Bunion   . Cancer (HCC)    1995;thyroid cancer, SURGERY AND RADIOACTIVE IODINE  . Ectopic pregnancy   . Euthyroid   . Fatigue   . Fibromyalgia   . GERD (gastroesophageal reflux disease)   . Headache(784.0)    otc med prn - last one 02/2014  . History of blood transfusion 2000   In [place], [place] at [facility] - ? 4 units transfused  . Hypothyroidism   . Knee pain   . Plantar fasciitis   . Sleep apnea    uses CPAP  . SVD (spontaneous vaginal delivery)    x 3  . Thyroid disease    thyroidectomy 1995     Family History  Problem Relation Age of Onset  . Hypertension Mother   . Cancer Mother        uterine  . Cancer Father   . Cancer  Brother        stomach; 97  . Early death Brother   . Stroke Maternal Uncle   . Heart disease Maternal Uncle   . Food Allergy Daughter   . Allergic rhinitis Daughter   . Allergic rhinitis Daughter   . Angioedema Neg Hx   . Asthma Neg Hx   . Eczema Neg Hx   . Immunodeficiency Neg Hx   . Urticaria Neg Hx      Current Outpatient Medications:  .  atorvastatin (LIPITOR) 10 MG tablet, Take 10 mg by mouth daily., Disp: , Rfl:  .  CONTOUR NEXT TEST test strip, 3 (three) times daily. as directed, Disp: , Rfl:  .  levothyroxine (SYNTHROID) 112 MCG tablet, Take 1 tablet (112 mcg total) by mouth daily., Disp: 90 tablet, Rfl: 1 .  Multiple Vitamins-Minerals (BARIATRIC MULTIVITAMINS/IRON) CAPS, Take 2 capsules by mouth daily. , Disp: , Rfl:  .  cetirizine (ZYRTEC) 10 MG tablet, Take 1 tablet (10 mg total) by mouth daily., Disp: 90 tablet, Rfl: 2   Allergies  Allergen Reactions  . Amoxicillin Other (See Comments)    Headache Has patient had a PCN reaction causing immediate rash, facial/tongue/throat  swelling, SOB or lightheadedness with hypotension: No Has patient had a PCN reaction causing severe rash involving mucus membranes or skin necrosis: No Has patient had a PCN reaction that required hospitalization: No Has patient had a PCN reaction occurring within the last 10 years: Unknown If all of the above answers are "NO", then may proceed with Cephalosporin use.   . Nitrofurantoin Monohyd Macro Other (See Comments)    Pt stated she gets a bad yeast infection  . Dexilant [Dexlansoprazole]     "bad stomachache"  . Meloxicam     Upset stomach and stomach pains  . Budesonide Rash  . Formoterol Rash  . Formoterol Fumarate Rash     Review of Systems  Constitutional: Negative.   HENT: Positive for sore throat.   Respiratory: Negative.   Cardiovascular: Negative.   Gastrointestinal: Negative.   Neurological: Negative.      Today's Vitals   07/17/20 1619  BP: 130/74  Pulse: 72   Temp: 98.5 F (36.9 C)  TempSrc: Oral  Weight: 219 lb 3.2 oz (99.4 kg)  Height: 5\' 4"  (1.626 m)   Body mass index is 37.63 kg/m.   Objective:  Physical Exam Vitals and nursing note reviewed.  Constitutional:      Appearance: Normal appearance.  HENT:     Head: Normocephalic and atraumatic.     Right Ear: Tympanic membrane, ear canal and external ear normal.     Left Ear: Tympanic membrane, ear canal and external ear normal.     Mouth/Throat:     Mouth: Mucous membranes are moist.     Tongue: No lesions.     Pharynx: Posterior oropharyngeal erythema present.  Cardiovascular:     Rate and Rhythm: Normal rate and regular rhythm.     Heart sounds: Normal heart sounds.  Pulmonary:     Effort: Pulmonary effort is normal.     Breath sounds: Normal breath sounds.  Skin:    General: Skin is warm.  Neurological:     General: No focal deficit present.     Mental Status: She is alert.  Psychiatric:        Mood and Affect: Mood normal.        Behavior: Behavior normal.         Assessment And Plan:     1. Pure hypercholesterolemia Comments: I will check lipid panel and LFTs. Encouraged to avoid fried foods and increase exercise as tolerated.  - Lipid panel  2. Sore throat Comments: Strep test negative. Her sx are likely due to PND. She is advised to start cetirizine 10mg  once daily.  - POCT rapid strep A  3. Tongue swelling Comments: I will check food allergy panel. I will also refer her to allergist for further evaluation. She is in agreement with her treatment plan.  - Ambulatory referral to Allergy - Allergens(96) Foods  4. Class 2 severe obesity due to excess calories with serious comorbidity and body mass index (BMI) of 37.0 to 37.9 in adult [facility]) Comments: She is aware of 11 pound weight gain. #ncouraged to strive for BMI less than 30 to decrease cardiac risk.  5. Encounter for hepatitis C screening test for low risk patient Comments: I will check hepatitis C  antibody.  - Hepatitis C antibody  6. Drug therapy - ALT     Patient was given opportunity to ask questions. Patient verbalized understanding of the plan and was able to repeat key elements of the plan. All questions were answered to their  satisfaction.  [name], MD   I, [name], MD, have reviewed all documentation for this visit. The documentation on 07/24/20 for the exam, diagnosis, procedures, and orders are all accurate and complete.  THE PATIENT IS ENCOURAGED TO PRACTICE SOCIAL DISTANCING DUE TO THE COVID-19 PANDEMIC.

## 2020-07-17 NOTE — Patient Instructions (Signed)

## 2020-07-19 LAB — ALLERGENS(96) FOODS
Allergen Apple, IgE: <0.1 kU/L
Allergen Banana IgE: <0.1 kU/L
Allergen Barley IgE: <0.1 kU/L
Allergen Black Pepper IgE: <0.1 kU/L
Allergen Blueberry IgE: <0.1 kU/L
Allergen Broccoli: <0.1 kU/L
Allergen Cabbage IgE: <0.1 kU/L
Allergen Carrot IgE: <0.1 kU/L
Allergen Cauliflower IgE: <0.1 kU/L
Allergen Celery IgE: <0.1 kU/L
Allergen Cinnamon IgE: <0.1 kU/L
Allergen Coconut IgE: <0.1 kU/L
Allergen Corn, IgE: <0.1 kU/L
Allergen Cucumber IgE: <0.1 kU/L
Allergen Garlic IgE: <0.1 kU/L
Allergen Ginger IgE: <0.1 kU/L
Allergen Gluten IgE: <0.1 kU/L
Allergen Grape IgE: <0.1 kU/L
Allergen Grapefruit IgE: <0.1 kU/L
Allergen Green Bean IgE: <0.1 kU/L
Allergen Green Bell Pepper IgE: <0.1 kU/L
Allergen Green Pea IgE: <0.1 kU/L
Allergen Lamb IgE: <0.1 kU/L
Allergen Lettuce IgE: <0.1 kU/L
Allergen Lime IgE: <0.1 kU/L
Allergen Melon IgE: <0.1 kU/L
Allergen Oat IgE: <0.1 kU/L
Allergen Onion IgE: <0.1 kU/L
Allergen Pear IgE: <0.1 kU/L
Allergen Potato, White IgE: <0.1 kU/L
Allergen Rice IgE: <0.1 kU/L
Allergen Salmon IgE: <0.1 kU/L
Allergen Strawberry IgE: <0.1 kU/L
Allergen Sweet Potato IgE: <0.1 kU/L
Allergen Tomato, IgE: <0.1 kU/L
Allergen Turkey IgE: <0.1 kU/L
Allergen Watermelon IgE: <0.1 kU/L
Allergen, Peach f95: <0.1 kU/L
Basil: <0.1 kU/L
Beef IgE: <0.1 kU/L
C074-IgE Gelatin: <0.1 kU/L
Chicken IgE: <0.1 kU/L
Chocolate/Cacao IgE: <0.1 kU/L
Clam IgE: <0.1 kU/L
Codfish IgE: <0.1 kU/L
Coffee: <0.1 kU/L
Cranberry IgE: <0.1 kU/L
Egg White IgE: <0.1 kU/L
F020-IgE Almond: <0.1 kU/L
F023-IgE Crab: <0.1 kU/L
F045-IgE Yeast: <0.1 kU/L
F076-IgE Alpha Lactalbumin: <0.1 kU/L
F077-IgE Beta Lactoglobulin: <0.1 kU/L
F078-IgE Casein: <0.1 kU/L
F080-IgE Lobster: <0.1 kU/L
F081-IgE Cheese, Cheddar Type: <0.1 kU/L
F089-IgE Mustard: <0.1 kU/L
F096-IgE Avocado: <0.1 kU/L
F202-IgE Cashew Nut: <0.1 kU/L
F214-IgE Spinach: <0.1 kU/L
F222-IgE Tea: <0.1 kU/L
F242-IgE Bing Cherry: <0.1 kU/L
F247-IgE Honey: <0.1 kU/L
F261-IgE Asparagus: <0.1 kU/L
F262-IgE Eggplant: <0.1 kU/L
F265-IgE Cumin: <0.1 kU/L
F278-IgE Bayleaf (Laurel): <0.1 kU/L
F279-IgE Chili Pepper: <0.1 kU/L
F283-IgE Oregano: <0.1 kU/L
F300-IgE Goat's Milk: <0.1 kU/L
F342-IgE Olive, Black: <0.1 kU/L
F343-IgE Raspberry: <0.1 kU/L
Hops: <0.1 kU/L
IgE Egg (Yolk): <0.1 kU/L
Kidney Bean IgE: <0.1 kU/L
Lemon: <0.1 kU/L
Lima Bean IgE: <0.1 kU/L
Malt: <0.1 kU/L
Mushroom IgE: <0.1 kU/L
Orange: <0.1 kU/L
Peanut IgE: <0.1 kU/L
Pineapple IgE: <0.1 kU/L
Pork IgE: <0.1 kU/L
Pumpkin IgE: <0.1 kU/L
Red Beet: <0.1 kU/L
Rye IgE: <0.1 kU/L
Scallop IgE: <0.1 kU/L
Sesame Seed IgE: <0.1 kU/L
Shrimp IgE: 0.26 kU/L — AB
Soybean IgE: <0.1 kU/L
Tuna: <0.1 kU/L
Vanilla: <0.1 kU/L
Walnut IgE: <0.1 kU/L
Wheat IgE: <0.1 kU/L
Whey: <0.1 kU/L
White Bean IgE: <0.1 kU/L

## 2020-07-19 LAB — ALT: ALT: 13 IU/L (ref 0–32)

## 2020-07-19 LAB — LIPID PANEL
Chol/HDL Ratio: 2.3 ratio (ref 0.0–4.4)
Cholesterol, Total: 170 mg/dL (ref 100–199)
HDL: 73 mg/dL (ref >39)
LDL Chol Calc (NIH): 87 mg/dL (ref 0–99)
Triglycerides: 51 mg/dL (ref 0–149)
VLDL Cholesterol Cal: 10 mg/dL (ref 5–40)

## 2020-07-19 LAB — HEPATITIS C ANTIBODY: Hep C Virus Ab: <0.1 s/co ratio (ref 0.0–0.9)

## 2020-07-20 ENCOUNTER — Encounter: Payer: BC Managed Care – PPO | Admitting: Podiatry

## 2020-07-20 ENCOUNTER — Telehealth: Payer: Self-pay | Admitting: Podiatry

## 2020-07-20 NOTE — Telephone Encounter (Signed)
Okay to work virtually if she can, if not, okay to be out of work

## 2020-07-20 NOTE — Telephone Encounter (Signed)
Ok to work virtually if she can.

## 2020-07-20 NOTE — Telephone Encounter (Signed)
Pt would like to know if she could get an extension from being out of work, or if she could work virtually with your consent. She cannot file for disability until after two months of being out of work. Please advise.

## 2020-07-25 ENCOUNTER — Encounter: Payer: Self-pay | Admitting: Podiatry

## 2020-07-25 ENCOUNTER — Other Ambulatory Visit: Payer: Self-pay

## 2020-07-25 ENCOUNTER — Ambulatory Visit (INDEPENDENT_AMBULATORY_CARE_PROVIDER_SITE_OTHER): Payer: BC Managed Care – PPO | Admitting: Podiatry

## 2020-07-25 DIAGNOSIS — Z9889 Other specified postprocedural states: Secondary | ICD-10-CM

## 2020-07-25 DIAGNOSIS — M7989 Other specified soft tissue disorders: Secondary | ICD-10-CM | POA: Diagnosis not present

## 2020-07-25 DIAGNOSIS — S86219A Strain of muscle(s) and tendon(s) of anterior muscle group at lower leg level, unspecified leg, initial encounter: Secondary | ICD-10-CM

## 2020-07-26 NOTE — Progress Notes (Signed)
She presents today 2 months status post tibialis anterior tendon repair she states is doing really well other than swelling.  She states to get some burning at nighttime but for the most part it seems to be doing pretty well as she continues to ambulate in her boot.  Objective: Vital signs are stable she is alert and x3 presents ambulating in her boot right lower extremity.  Physical exam demonstrates strong palpable pulses tibialis anterior tendon is intact with good inversion against resistance.  She has some tenderness on palpation but the incision appears to be healing very nicely she is got some scarring distal to her old injury site with some swelling.  Assessment well-healing surgical foot and leg.  Plan: At this point I will allow her to get back to work as long she can still use her knee scooter occasionally and continue to wear her boot we will place her in a short cam walker and we are going to send her to physical therapy at [facility] and I will follow-up with her in about a month or so.  Should she have questions or concerns or feel that anything is happened she will notify us immediately.

## 2020-07-31 ENCOUNTER — Encounter: Payer: Self-pay | Admitting: Internal Medicine

## 2020-08-07 LAB — HM MAMMOGRAPHY

## 2020-08-17 ENCOUNTER — Other Ambulatory Visit: Payer: Self-pay | Admitting: Obstetrics and Gynecology

## 2020-08-17 DIAGNOSIS — R928 Other abnormal and inconclusive findings on diagnostic imaging of breast: Secondary | ICD-10-CM

## 2020-08-28 ENCOUNTER — Ambulatory Visit
Admission: RE | Admit: 2020-08-28 | Discharge: 2020-08-28 | Disposition: A | Discharge status: Home / Self Care | Payer: BC Managed Care – PPO | Source: Ambulatory Visit | Attending: Obstetrics and Gynecology | Admitting: Obstetrics and Gynecology

## 2020-08-28 ENCOUNTER — Other Ambulatory Visit: Payer: Self-pay

## 2020-08-28 DIAGNOSIS — R928 Other abnormal and inconclusive findings on diagnostic imaging of breast: Secondary | ICD-10-CM

## 2020-08-28 LAB — HM MAMMOGRAPHY

## 2020-08-31 ENCOUNTER — Ambulatory Visit (INDEPENDENT_AMBULATORY_CARE_PROVIDER_SITE_OTHER): Payer: BC Managed Care – PPO | Admitting: Podiatry

## 2020-08-31 ENCOUNTER — Other Ambulatory Visit: Payer: Self-pay

## 2020-08-31 DIAGNOSIS — S86219A Strain of muscle(s) and tendon(s) of anterior muscle group at lower leg level, unspecified leg, initial encounter: Secondary | ICD-10-CM | POA: Diagnosis not present

## 2020-08-31 DIAGNOSIS — M7989 Other specified soft tissue disorders: Secondary | ICD-10-CM | POA: Diagnosis not present

## 2020-08-31 DIAGNOSIS — Z9889 Other specified postprocedural states: Secondary | ICD-10-CM

## 2020-08-31 NOTE — Progress Notes (Signed)
She presents today for follow-up of her tibialis anterior tendon repair.  Surgery was back in June and she is doing quite well at this point.  States that she feels about 50 to 60% improved states that physical therapy seems to have really helped her states that the swelling has decreased that she is no longer getting sharp pains but not consistently.  Objective: Vital signs are stable alert oriented x3 decrease in edema is considerable she has great range of motion dorsiflexion plantarflexion inversion and eversion mild tenderness on palpation she still gets a little electrical shocks every once in a while.  Assessment: Well-healing surgical foot and ankle.  Plan: Discussed etiology pathology conservative surgical therapies this point time I recommend she continue current therapies and physical therapy I will follow-up with her in about 6 weeks.

## 2020-09-06 ENCOUNTER — Ambulatory Visit: Payer: BC Managed Care – PPO | Admitting: Allergy

## 2020-09-06 ENCOUNTER — Other Ambulatory Visit: Payer: Self-pay

## 2020-09-06 ENCOUNTER — Encounter: Payer: Self-pay | Admitting: Allergy

## 2020-09-06 VITALS — BP 112/78 | HR 73 | Temp 98.7°F | Resp 16 | Ht 65.0 in | Wt 220.4 lb

## 2020-09-06 DIAGNOSIS — J3089 Other allergic rhinitis: Secondary | ICD-10-CM

## 2020-09-06 DIAGNOSIS — T783XXD Angioneurotic edema, subsequent encounter: Secondary | ICD-10-CM | POA: Diagnosis not present

## 2020-09-06 DIAGNOSIS — J4540 Moderate persistent asthma, uncomplicated: Secondary | ICD-10-CM

## 2020-09-06 MED ORDER — EPINEPHRINE 0.3 MG/0.3ML IJ SOAJ
0.3000 mg | INTRAMUSCULAR | 2 refills | Status: DC | PRN
Start: 2020-09-06 — End: 2020-12-21

## 2020-09-06 MED ORDER — ALBUTEROL SULFATE HFA 108 (90 BASE) MCG/ACT IN AERS
2.0000 | INHALATION_SPRAY | Freq: Four times a day (QID) | RESPIRATORY_TRACT | 1 refills | Status: DC | PRN
Start: 2020-09-06 — End: 2020-12-21

## 2020-09-06 NOTE — Progress Notes (Signed)
New Patient Note  RE: Dana Vasquez MRN: [id number] DOB: 21-Apr-1966 Date of Office Visit: 09/06/2020  Referring provider: Glendale Chard, MD Primary care provider: Glendale Chard, MD  Chief Complaint: tongue swelling  History of present illness: Dana Vasquez is a 54 y.o. female presenting today for consultation for angioedema.  She is a former patient of our practice with her last visit on 01/28/2017 by Dr. Verlin Fester. She returns today as she has been having issues with oral swelling. She states she developed "knots" in her lip and at first she thought it was a cold sore.  Then she would notice these knots could be on either side of her lip.  She states then she notice the lip would look visibly swollen and then she had episodes of tongue swelling as well.  One episode she felt like she could not swallow and she called EMS.  EMS recommended she take benadryl.  She states that particular day she had been eating almonds.  She had been eating almonds for years prior without issue.  Several days later she tried the almonds again and did not have any issues.  About a week later she was eating watermelon and states she started having the lip and tongue swelling.  She had another episode of swelling after eating shrimp.  She states she has had shrimp since without issue.    She started noticing these issues around July 2021 after having foot surgery.  The first 2 weeks after surgery she reports switching between tylenol and ibuprofen for pain control. She was also taking oxycodone.  Last swelling episode was several weeks ago.  She states she just came out of the walking boot about 2 weeks ago.  She states she took ibuprofen around that time as well..   She denies any hives with swelling and no systemic symptoms.   She has heard of family members having shellfish allergy.   She states her daughter has had episodes of severe lip swelling.   She has a history of asthma and allergic rhinitis.  She  reports symptoms of runny nose, sneezing that can occur year-round. She is taking zyrtec as needed.  She did take it last night but is not consistent with Zyrtec use.  She has an albuterol inhaler that is used very infrequently.  States an albuterol inhaler can last 2 years ago.    Review of systems: Review of Systems  Constitutional: Negative.   HENT:       See HPI  Eyes: Negative.   Respiratory: Negative.   Cardiovascular: Negative.   Gastrointestinal: Negative.   Musculoskeletal: Negative.   Skin: Negative.   Neurological: Negative.     All other systems negative unless noted above in HPI  Past medical history: Past Medical History:  Diagnosis Date  . Allergy   . Anemia   . Arthritis    knees  AND SHOULDERS  . Asthma    allergy related - rarely uses inhaler  . Bruises easily   . Bunion   . Cancer (HCC)    1995;thyroid cancer, SURGERY AND RADIOACTIVE IODINE  . Ectopic pregnancy   . Euthyroid   . Fatigue   . Fibromyalgia   . GERD (gastroesophageal reflux disease)   . Headache(784.0)    otc med prn - last one 02/2014  . History of blood transfusion 2000   In [place], [place] at [facility] - ? 4 units transfused  . Hypothyroidism   . Knee pain   .  Plantar fasciitis   . Sleep apnea    uses CPAP  . SVD (spontaneous vaginal delivery)    x 3  . Thyroid disease    thyroidectomy 1995    Past surgical history: Past Surgical History:  Procedure Laterality Date  . ABDOMINAL HYSTERECTOMY N/A 04/20/2014   Procedure: Total ABDOMINAL HYSTERECTOMY partial right salpingectomy;  Surgeon: Eldred Manges, MD;  Location: [facility];  Service: Gynecology;  Laterality: N/A;  . BREATH TEK H PYLORI  09/18/2011   Procedure: BREATH TEK H PYLORI;  Surgeon: Pedro Earls, MD;  Location: [facility];  Service: General;  Laterality: N/A;  . BUNIONECTOMY     left;2015  . BUNIONECTOMY    . COLONOSCOPY    . DILATION AND CURETTAGE OF UTERUS  1988   endometriosis  . ECTOPIC PREGNANCY SURGERY     . fallopian tubes removed     left tube removed per patient - laparotomy  . FOOT SURGERY Right 2019  . FOOT SURGERY Bilateral 2021  . LAPAROSCOPIC GASTRIC SLEEVE RESECTION N/A 04/14/2018   Procedure: LAPAROSCOPIC GASTRIC SLEEVE RESECTION WITH UPPER ENDO AND ERAS PATHWAY;  Surgeon: Johnathan Hausen, MD;  Location: [facility];  Service: General;  Laterality: N/A;  . LYSIS OF ADHESION N/A 04/20/2014   Procedure: LYSIS OF ADHESION;  Surgeon: Eldred Manges, MD;  Location: [facility];  Service: Gynecology;  Laterality: N/A;  . THYROIDECTOMY  1995  . TUBAL LIGATION    . WISDOM TOOTH EXTRACTION      Family history:  Family History  Problem Relation Age of Onset  . Hypertension Mother   . Cancer Mother        uterine  . Cancer Father   . Cancer Brother        stomach; 13  . Early death Brother   . Stroke Maternal Uncle   . Heart disease Maternal Uncle   . Food Allergy Daughter   . Allergic rhinitis Daughter   . Allergic rhinitis Daughter   . Angioedema Neg Hx   . Asthma Neg Hx   . Eczema Neg Hx   . Immunodeficiency Neg Hx   . Urticaria Neg Hx   . Atopy Neg Hx     Social history: She lives in a home with carpeting with gas heating and central cooling.  No pets in the home.  There is no concern for water damage, mildew or roaches in the home.  She is a Radio producer.  She denies a smoking history.  Medication List: Current Outpatient Medications  Medication Sig Dispense Refill  . atorvastatin (LIPITOR) 10 MG tablet Take 10 mg by mouth daily.    . cetirizine (ZYRTEC) 10 MG tablet Take 1 tablet (10 mg total) by mouth daily. 90 tablet 2  . CONTOUR NEXT TEST test strip 3 (three) times daily. as directed    . levothyroxine (SYNTHROID) 112 MCG tablet Take 1 tablet (112 mcg total) by mouth daily. 90 tablet 1  . Multiple Vitamins-Minerals (BARIATRIC MULTIVITAMINS/IRON) CAPS Take 2 capsules by mouth daily.     . Multiple Vitamins-Minerals (MULTIVITAMIN ADULT EXTRA C PO) multivitamin     No  current facility-administered medications for this visit.    Known medication allergies: Allergies  Allergen Reactions  . Amoxicillin Other (See Comments)    Headache Has patient had a PCN reaction causing immediate rash, facial/tongue/throat swelling, SOB or lightheadedness with hypotension: No Has patient had a PCN reaction causing severe rash involving mucus membranes or skin necrosis: No Has patient had  a PCN reaction that required hospitalization: No Has patient had a PCN reaction occurring within the last 10 years: Unknown If all of the above answers are "NO", then may proceed with Cephalosporin use.   . Nitrofurantoin Monohyd Macro Other (See Comments)    Pt stated she gets a bad yeast infection  . Dexilant [Dexlansoprazole]     "bad stomachache"  . Meloxicam     Upset stomach and stomach pains  . Budesonide Rash  . Formoterol Rash  . Formoterol Fumarate Rash     Physical examination: Blood pressure 112/78, pulse 73, temperature 98.7 F (37.1 C), temperature source Temporal, resp. rate 16, height 5\' 5"  (1.651 m), weight 220 lb 6.4 oz (100 kg), last menstrual period 04/07/2014, SpO2 97 %.  General: Alert, interactive, in no acute distress. HEENT: PERRLA, TMs pearly gray, turbinates non-edematous without discharge, post-pharynx non erythematous. Neck: Supple without lymphadenopathy. Lungs: Clear to auscultation without wheezing, rhonchi or rales. {no increased work of breathing. CV: Normal S1, S2 without murmurs. Abdomen: Nondistended, nontender. Skin: Warm and dry, without lesions or rashes. Extremities:  No clubbing, cyanosis or edema. Neuro:   Grossly intact.  Diagnositics/Labs: Labs:  Component     Latest Ref Rng & Units 07/17/2020  F020-IgE Almond     Class 0 kU/L <0.10  Allergen Apple, IgE     Class 0 kU/L <0.10  F261-IgE Asparagus     Class 0 kU/L <0.10  F096-IgE Avocado     Class 0 kU/L <0.10  Allergen Banana IgE     Class 0 kU/L <0.10  Allergen  Barley IgE     Class 0 kU/L <0.10  Basil     Class 0 kU/L <0.10  F278-IgE Bayleaf ([place]urel)     Class 0 kU/L <0.10  Allergen Green Bean IgE     Class 0 kU/L <0.10  Lima Bean IgE     Class 0 kU/L <0.10  White Bean IgE     Class 0 kU/L <0.10  Beef IgE     Class 0 kU/L <0.10  Red Beet     Class 0 kU/L <0.10  Allergen Blueberry IgE     Class 0 kU/L <0.10  Allergen Broccoli     Class 0 kU/L <0.10  Allergen Cabbage IgE     Class 0 kU/L <0.10  Allergen Melon IgE     Class 0 kU/L <0.10  Allergen Carrot IgE     Class 0 kU/L <0.10  F078-IgE Casein     Class 0 kU/L <0.10  F202-IgE Cashew Nut     Class 0 kU/L <0.10  Allergen Cauliflower IgE     Class 0 kU/L <0.10  Allergen Celery IgE     Class 0 kU/L <0.10  F081-IgE Cheese, Cheddar Type     Class 0 kU/L <0.10  Chicken IgE     Class 0 kU/L <0.10  Allergen Cinnamon IgE     Class 0 kU/L <0.10  Clam IgE     Class 0 kU/L <0.10  Chocolate/Cacao IgE     Class 0 kU/L <0.10  Allergen Coconut IgE     Class 0 kU/L <0.10  Codfish IgE     Class 0 kU/L <0.10  Coffee     Class 0 kU/L <0.10  Allergen Corn, IgE     Class 0 kU/L <0.10  F023-IgE Crab     Class 0 kU/L <0.10  Allergen Cucumber IgE     Class 0 kU/L <0.10  F077-IgE Beta Lactoglobulin  Class 0 kU/L <0.10  F262-IgE Eggplant     Class 0 kU/L <0.10  Egg White IgE     Class 0 kU/L <0.10  IgE Egg (Yolk)     Class 0 kU/L <1.51  Allergen Garlic IgE     Class 0 kU/L <0.10  Allergen Ginger IgE     Class 0 kU/L <0.10  Allergen Gluten IgE     Class 0 kU/L <0.10  Allergen Grape IgE     Class 0 kU/L <0.10  Allergen Grapefruit IgE     Class 0 kU/L <0.10  F247-IgE Honey     Class 0 kU/L <0.10  Allergen Lamb IgE     Class 0 kU/L <0.10  Lemon     Class 0 kU/L <0.10  Allergen Lime IgE     Class 0 kU/L <0.10  Allergen Lettuce IgE     Class 0 kU/L <0.10  F080-IgE Lobster     Class 0 kU/L <0.10  Malt     Class 0 kU/L <0.10  F076-IgE Alpha Lactalbumin     Class 0  kU/L <0.10  F300-IgE Goat's Milk     Class 0 kU/L <0.10  Mushroom IgE     Class 0 kU/L <0.10  F089-IgE Mustard     Class 0 kU/L <0.10  Allergen Oat IgE     Class 0 kU/L <0.10  F342-IgE Olive, Black     Class 0 kU/L <0.10  Allergen Onion IgE     Class 0 kU/L <0.10  Orange     Class 0 kU/L <0.10  F283-IgE Oregano     Class 0 kU/L <0.10  Allergen Green Pea IgE     Class 0 kU/L <0.10  Allergen, Peach f95     Class 0 kU/L <0.10  Peanut IgE     Class 0 kU/L <0.10  Allergen Pear IgE     Class 0 kU/L <0.10  Allergen Black Pepper IgE     Class 0 kU/L <0.10  F279-IgE Chili Pepper     Class 0 kU/L <0.10  Allergen Green Bell Pepper IgE     Class 0 kU/L <0.10  Pineapple IgE     Class 0 kU/L <0.10  Pork IgE     Class 0 kU/L <0.10  Allergen Sweet Potato IgE     Class 0 kU/L <0.10  Allergen Potato, White IgE     Class 0 kU/L <0.10  Allergen Rice IgE     Class 0 kU/L <0.10  C074-IgE Gelatin     Class 0 kU/L <0.10  F343-IgE Raspberry     Class 0 kU/L <0.10  Kidney Bean IgE     Class 0 kU/L <0.10  Hops     Class 0 kU/L <0.10  Cranberry IgE     Class 0 kU/L <0.10  F265-IgE Cumin     Class 0 kU/L <0.10  Vanilla     Class 0 kU/L <0.10  Rye IgE     Class 0 kU/L <0.10  Allergen Salmon IgE     Class 0 kU/L <0.10  Scallop IgE     Class 0 kU/L <0.10  Sesame Seed IgE     Class 0 kU/L <0.10  Shrimp IgE     Class 0/I kU/L 0.26 (A)  Soybean IgE     Class 0 kU/L <0.10  F214-IgE Spinach     Class 0 kU/L <0.10  Pumpkin IgE     Class 0 kU/L <0.10  Allergen Strawberry IgE  Class 0 kU/L <0.10  F242-IgE Bing Cherry     Class 0 kU/L <0.10  F222-IgE Tea     Class 0 kU/L <0.10  Allergen Tomato, IgE     Class 0 kU/L <0.10  Tuna     Class 0 kU/L <0.10  Allergen [place] IgE     Class 0 kU/L <0.10  Walnut IgE     Class 0 kU/L <0.10  Allergen Watermelon IgE     Class 0 kU/L <0.10  Wheat IgE     Class 0 kU/L <0.10  Whey     Class 0 kU/L <0.10  F045-IgE Yeast     Class 0  kU/L <0.10   Allergy testing: Unable to perform due to recent antihistamine use   Assessment and plan: Patient Instructions  Episodic swelling (angioedema)  - concern your oral swelling may be triggered by use of NSAID/opioid medications you were taking for foot surgery.  NSAIDs/opioid medications can cause histamine release from allergy cells than can lead to itching, hives and or swelling.  There are several times you have had NSAID (ie ibuprofen) and recall development of lip/tongue swelling.  At this time would avoid NSAIDs/opiates at this time.  If needing pain control use either Tylenol.  Can ask your PCP or orthopedist about Ultram; Ultram is usually recommended in those who have adverse events with NSAIDs/opioid medications as this is typically well tolerated.    - another concern is hereditary angioedema (HAE).  This is a non-histamine driven process.  HAE can cause episodic swelling and facial/oral swelling is common.  Medications like benadryl/antihistamines usually do not help treat this and there are special medications for prevention and treatment.  Will obtain HAE panel at this time  - will also obtain tryptase level and environmental allergy panel  - food allergy panel only showed low IgE to shrimp.  You have had shrimp without issue thus you are not allergic to shrimp only sensitized.  At this time not concern swelling is food related.    - to help decrease risk of swelling start Cetirizine 10mg  1 tab daily at this time.    - if you have further swelling on daily Cetirizine then would add in Pepcid 20mg  1 tab daily to Cetirizine  - recommend you have access to an epinephrine device at this time and provided with emergency action plan  Asthma  - have access to albuterol inhaler 2 puffs every 4-6 hours as needed for cough/wheeze/shortness of breath/chest tightness.  May use 15-20 minutes prior to activity.   Monitor frequency of use.   Allergic rhinitis - zyrtec as above -  environmental allergy panel as above  Follow-up in 2-3 months or sooner if needed   I appreciate the opportunity to take part in Denni's care. Please do not hesitate to contact me with questions.  Sincerely,   Prudy Feeler, MD Allergy/Immunology [facility] [place]

## 2020-09-06 NOTE — Patient Instructions (Addendum)
Episodic swelling (angioedema)  - concern your oral swelling may be triggered by use of NSAID/opioid medications you were taking for foot surgery.  NSAIDs/opioid medications can cause histamine release from allergy cells than can lead to itching, hives and or swelling.  There are several times you have had NSAID (ie ibuprofen) and recall development of lip/tongue swelling.  At this time would avoid NSAIDs/opiates at this time.  If needing pain control use either Tylenol.  Can ask your PCP or orthopedist about Ultram; Ultram is usually recommended in those who have adverse events with NSAIDs/opioid medications as this is typically well tolerated.    - another concern is hereditary angioedema (HAE).  This is a non-histamine driven process.  HAE can cause episodic swelling and facial/oral swelling is common.  Medications like benadryl/antihistamines usually do not help treat this and there are special medications for prevention and treatment.  Will obtain HAE panel at this time  - will also obtain tryptase level and environmental allergy panel  - food allergy panel only showed low IgE to shrimp.  You have had shrimp without issue thus you are not allergic to shrimp only sensitized.  At this time not concern swelling is food related.    - to help decrease risk of swelling start Cetirizine 10mg  1 tab daily at this time.    - if you have further swelling on daily Cetirizine then would add in Pepcid 20mg  1 tab daily to Cetirizine  - recommend you have access to an epinephrine device at this time and provided with emergency action plan  Asthma  - have access to albuterol inhaler 2 puffs every 4-6 hours as needed for cough/wheeze/shortness of breath/chest tightness.  May use 15-20 minutes prior to activity.   Monitor frequency of use.    Allergic rhinitis - zyrtec as above - environmental allergy panel as above  Follow-up in 2-3 months or sooner if needed

## 2020-09-07 ENCOUNTER — Other Ambulatory Visit: Payer: Self-pay | Admitting: Sports Medicine

## 2020-09-07 DIAGNOSIS — M545 Low back pain, unspecified: Secondary | ICD-10-CM

## 2020-09-13 LAB — ALLERGENS W/TOTAL IGE AREA 2
Alternaria Alternata IgE: <0.1 kU/L
Aspergillus Fumigatus IgE: <0.1 kU/L
Bermuda Grass IgE: <0.1 kU/L
Cat Dander IgE: <0.1 kU/L
Cedar, Mountain IgE: <0.1 kU/L
Cladosporium Herbarum IgE: <0.1 kU/L
Cockroach, German IgE: <0.1 kU/L
Common Silver Birch IgE: <0.1 kU/L
Cottonwood IgE: <0.1 kU/L
D Farinae IgE: 0.42 kU/L — AB
D Pteronyssinus IgE: 0.39 kU/L — AB
Dog Dander IgE: <0.1 kU/L
Elm, American IgE: <0.1 kU/L
IgE (Immunoglobulin E), Serum: 49 IU/mL (ref 6–495)
Johnson Grass IgE: <0.1 kU/L
Maple/Box Elder IgE: <0.1 kU/L
Mouse Urine IgE: <0.1 kU/L
Oak, White IgE: <0.1 kU/L
Pecan, Hickory IgE: <0.1 kU/L
Penicillium Chrysogen IgE: <0.1 kU/L
Pigweed, Rough IgE: <0.1 kU/L
Ragweed, Short IgE: <0.1 kU/L
Sheep Sorrel IgE Qn: <0.1 kU/L
Timothy Grass IgE: <0.1 kU/L
White Mulberry IgE: <0.1 kU/L

## 2020-09-13 LAB — TRYPTASE: Tryptase: 4.5 ug/L (ref 2.2–13.2)

## 2020-09-13 LAB — COMPLEMENT COMPONENT C1Q: Complement C1Q: 14.8 mg/dL (ref 10.3–20.5)

## 2020-09-13 LAB — C1 ESTERASE INHIBITOR: C1INH SerPl-mCnc: 37 mg/dL (ref 21–39)

## 2020-09-13 LAB — C1 ESTERASE INHIBITOR, FUNCTIONAL: C1INH Functional/C1INH Total MFr SerPl: 89 %mean normal

## 2020-09-13 LAB — C4 COMPLEMENT: Complement C4, Serum: 35 mg/dL (ref 12–38)

## 2020-09-20 ENCOUNTER — Telehealth: Payer: Self-pay | Admitting: *Deleted

## 2020-09-20 NOTE — Telephone Encounter (Signed)
Patient is ready to return scooter to medical supply company and did not where she got it.  Called and informed that according records in chart, [facility] would pick up when she's ready to return, gave her their number. She verbalized understanding.

## 2020-09-25 ENCOUNTER — Encounter: Payer: Self-pay | Admitting: Internal Medicine

## 2020-09-25 ENCOUNTER — Other Ambulatory Visit: Payer: BC Managed Care – PPO

## 2020-09-27 ENCOUNTER — Encounter: Payer: Self-pay | Admitting: Internal Medicine

## 2020-09-27 ENCOUNTER — Ambulatory Visit: Payer: BC Managed Care – PPO | Admitting: Internal Medicine

## 2020-09-27 ENCOUNTER — Other Ambulatory Visit: Payer: Self-pay

## 2020-09-27 VITALS — BP 126/78 | HR 61 | Temp 98.2°F | Ht 65.0 in | Wt 220.2 lb

## 2020-09-27 DIAGNOSIS — E1169 Type 2 diabetes mellitus with other specified complication: Secondary | ICD-10-CM

## 2020-09-27 DIAGNOSIS — R0981 Nasal congestion: Secondary | ICD-10-CM | POA: Diagnosis not present

## 2020-09-27 DIAGNOSIS — R7309 Other abnormal glucose: Secondary | ICD-10-CM

## 2020-09-27 DIAGNOSIS — Z0000 Encounter for general adult medical examination without abnormal findings: Secondary | ICD-10-CM

## 2020-09-27 DIAGNOSIS — E669 Obesity, unspecified: Secondary | ICD-10-CM | POA: Diagnosis not present

## 2020-09-27 DIAGNOSIS — Z6836 Body mass index (BMI) 36.0-36.9, adult: Secondary | ICD-10-CM | POA: Diagnosis not present

## 2020-09-27 NOTE — Progress Notes (Signed)
I,Katawbba Wiggins,acting as a Education administrator for [name], MD.,have documented all relevant documentation on the behalf of [name], MD,as directed by  [name], MD while in the presence of [name], MD. This visit occurred during the SARS-CoV-2 public health emergency.  Safety protocols were in place, including screening questions prior to the visit, additional usage of staff PPE, and extensive cleaning of exam room while observing appropriate contact time as indicated for disinfecting solutions.  Subjective:     Patient ID: Dana Vasquez , female    DOB: [birth year] , 54 y.o.   MRN: [id number]   Chief Complaint  Patient presents with  . Annual Exam  . Sinusitis    HPI  She is here today for a full physical examination. She is followed by GYN for her pelvic exams.   Hypertension This is a chronic problem. The current episode started more than 1 year ago. The problem is controlled. Pertinent negatives include no blurred vision, chest pain, palpitations or shortness of breath. Risk factors for coronary artery disease include obesity, post-menopausal state and sedentary lifestyle. Past treatments include lifestyle changes. The current treatment provides moderate improvement. Compliance problems include exercise.      Past Medical History:  Diagnosis Date  . Allergy   . Anemia   . Arthritis    knees  AND SHOULDERS  . Asthma    allergy related - rarely uses inhaler  . Bruises easily   . Bunion   . Cancer (HCC)    1995;thyroid cancer, SURGERY AND RADIOACTIVE IODINE  . Ectopic pregnancy   . Euthyroid   . Fatigue   . Fibromyalgia   . GERD (gastroesophageal reflux disease)   . Headache(784.0)    otc med prn - last one 02/2014  . History of blood transfusion 2000   In [place], [place] at [facility] - ? 4 units transfused  . Hypothyroidism   . Knee pain   . Plantar fasciitis   . Sleep apnea    uses CPAP  . SVD (spontaneous vaginal delivery)    x 3  . Thyroid  disease    thyroidectomy 1995     Family History  Problem Relation Age of Onset  . Hypertension Mother   . Cancer Mother        uterine  . Cancer Father   . Cancer Brother        stomach; 90  . Early death Brother   . Stroke Maternal Uncle   . Heart disease Maternal Uncle   . Food Allergy Daughter   . Allergic rhinitis Daughter   . Allergic rhinitis Daughter   . Angioedema Neg Hx   . Asthma Neg Hx   . Eczema Neg Hx   . Immunodeficiency Neg Hx   . Urticaria Neg Hx   . Atopy Neg Hx      Current Outpatient Medications:  .  albuterol (VENTOLIN HFA) 108 (90 Base) MCG/ACT inhaler, Inhale 2 puffs into the lungs every 6 (six) hours as needed for wheezing or shortness of breath., Disp: 18 g, Rfl: 1 .  atorvastatin (LIPITOR) 10 MG tablet, Take 10 mg by mouth daily., Disp: , Rfl:  .  cefdinir (OMNICEF) 300 MG capsule, Take 300 mg by mouth 2 (two) times daily., Disp: , Rfl:  .  cetirizine (ZYRTEC) 10 MG tablet, Take 1 tablet (10 mg total) by mouth daily., Disp: 90 tablet, Rfl: 2 .  EPINEPHrine (AUVI-Q) 0.3 mg/0.3 mL IJ SOAJ injection, Inject 0.3  mg into the muscle as needed for anaphylaxis. As directed for life-threatening allergic reactions, Disp: 2 each, Rfl: 2 .  levothyroxine (SYNTHROID) 112 MCG tablet, Take 1 tablet (112 mcg total) by mouth daily., Disp: 90 tablet, Rfl: 1   Allergies  Allergen Reactions  . Amoxicillin Other (See Comments)    Headache Has patient had a PCN reaction causing immediate rash, facial/tongue/throat swelling, SOB or lightheadedness with hypotension: No Has patient had a PCN reaction causing severe rash involving mucus membranes or skin necrosis: No Has patient had a PCN reaction that required hospitalization: No Has patient had a PCN reaction occurring within the last 10 years: Unknown If all of the above answers are "NO", then may proceed with Cephalosporin use.   . Nitrofurantoin Monohyd Macro Other (See Comments)    Pt stated she gets a bad yeast  infection  . Dexilant [Dexlansoprazole]     "bad stomachache"  . Meloxicam     Upset stomach and stomach pains  . Budesonide Rash  . Formoterol Rash  . Formoterol Fumarate Rash      The patient states she uses none for birth control. Last LMP was Patient's last menstrual period was 04/07/2014.. Negative for Dysmenorrhea. Negative for: breast discharge, breast lump(s), breast pain and breast self exam. Associated symptoms include abnormal vaginal bleeding. Pertinent negatives include abnormal bleeding (hematology), anxiety, decreased libido, depression, difficulty falling sleep, dyspareunia, history of infertility, nocturia, sexual dysfunction, sleep disturbances, urinary incontinence, urinary urgency, vaginal discharge and vaginal itching. Diet regular.The patient states her exercise level is  minimal.   . The patient's tobacco use is:  Social History   Tobacco Use  Smoking Status Never Smoker  Smokeless Tobacco Never Used  . She has been exposed to passive smoke. The patient's alcohol use is:  Social History   Substance and Sexual Activity  Alcohol Use Yes  . Alcohol/week: 0.0 standard drinks   Comment: occ    Review of Systems  Constitutional: Negative.   HENT: Positive for congestion.        She c/o sinus congestion. Thinks she could have infection. Denies fever/chills. Mucus is not discolored.   Eyes: Negative.  Negative for blurred vision.  Respiratory: Negative.  Negative for shortness of breath.   Cardiovascular: Negative.  Negative for chest pain and palpitations.  Gastrointestinal: Negative.   Endocrine: Negative.   Genitourinary: Negative.   Musculoskeletal: Negative.   Skin: Negative.   Allergic/Immunologic: Negative.   Neurological: Negative.   Hematological: Negative.   Psychiatric/Behavioral: Negative.      Today's Vitals   09/27/20 1533  BP: 126/78  Pulse: 61  Temp: 98.2 F (36.8 C)  TempSrc: Oral  Weight: 220 lb 3.2 oz (99.9 kg)  Height: '5\' 5"'   (1.651 m)   Body mass index is 36.64 kg/m.  Wt Readings from Last 3 Encounters:  09/27/20 220 lb 3.2 oz (99.9 kg)  09/06/20 220 lb 6.4 oz (100 kg)  07/17/20 219 lb 3.2 oz (99.4 kg)   Objective:  Physical Exam Vitals and nursing note reviewed.  Constitutional:      General: She is not in acute distress.    Appearance: Normal appearance. She is well-developed. She is obese.  HENT:     Head: Normocephalic and atraumatic.     Comments: Neg tenderness to percussion    Right Ear: Hearing, tympanic membrane, ear canal and external ear normal. There is no impacted cerumen.     Left Ear: Hearing, tympanic membrane, ear canal and external ear normal.  There is no impacted cerumen.     Nose:     Comments: Deferred, masked    Mouth/Throat:     Comments: Deferred, masked  Eyes:     General: Lids are normal.     Extraocular Movements: Extraocular movements intact.     Conjunctiva/sclera: Conjunctivae normal.  Neck:     Thyroid: No thyroid mass.     Vascular: No carotid bruit.  Cardiovascular:     Rate and Rhythm: Normal rate and regular rhythm.     Pulses: Normal pulses.          Dorsalis pedis pulses are 2+ on the right side and 2+ on the left side.     Heart sounds: Normal heart sounds. No murmur heard.   Pulmonary:     Effort: Pulmonary effort is normal.     Breath sounds: Normal breath sounds.  Chest:  Breasts:     Tanner Score is 5.     Right: Normal.     Left: Normal.    Abdominal:     General: Bowel sounds are normal. There is no distension.     Palpations: Abdomen is soft.     Tenderness: There is no abdominal tenderness.  Genitourinary:    Comments: Deferred  Musculoskeletal:        General: No swelling. Normal range of motion.     Cervical back: Full passive range of motion without pain, normal range of motion and neck supple.     Right lower leg: No edema.     Left lower leg: No edema.  Feet:     Right foot:     Protective Sensation: 5 sites tested. 5 sites  sensed.     Skin integrity: Dry skin present.     Toenail Condition: Right toenails are normal.     Left foot:     Protective Sensation: 5 sites tested. 5 sites sensed.     Skin integrity: Dry skin present.     Toenail Condition: Left toenails are normal.     Comments: Healed surgical scars on right foot Skin:    General: Skin is warm and dry.     Capillary Refill: Capillary refill takes less than 2 seconds.  Neurological:     General: No focal deficit present.     Mental Status: She is alert and oriented to person, place, and time.     Cranial Nerves: No cranial nerve deficit.     Sensory: No sensory deficit.  Psychiatric:        Mood and Affect: Mood normal.        Behavior: Behavior normal.        Thought Content: Thought content normal.        Judgment: Judgment normal.         Assessment And Plan:     1. Encounter for general adult medical examination without abnormal findings Comments: A full exam was performed. Importance of monthly self breast exams was discussed with the patient. PATIENT IS ADVISED TO GET 30-45 MINUTES REGULAR EXERCISE NO LESS THAN FOUR TO FIVE DAYS PER WEEK - BOTH WEIGHTBEARING EXERCISES AND AEROBIC ARE RECOMMENDED.  PATIENT IS ADVISED TO FOLLOW A HEALTHY DIET WITH AT LEAST SIX FRUITS/VEGGIES PER DAY, DECREASE INTAKE OF RED MEAT, AND TO INCREASE FISH INTAKE TO TWO DAYS PER WEEK.  MEATS/FISH SHOULD NOT BE FRIED, BAKED OR BROILED IS PREFERABLE.  I SUGGEST WEARING SPF 50 SUNSCREEN ON EXPOSED PARTS AND ESPECIALLY WHEN IN THE DIRECT SUNLIGHT FOR  AN EXTENDED PERIOD OF TIME.  PLEASE AVOID FAST FOOD RESTAURANTS AND INCREASE YOUR WATER INTAKE.  - Hemoglobin A1c - CBC - CMP14+EGFR - TSH  2. Diabetes mellitus type 2 in obese [facility]) Comments: Diabetic foot exam was performed.I will check BMP and a1c today. Encouraged to avoid sugary beverages, including diet drinks.  I DISCUSSED WITH THE PATIENT AT LENGTH REGARDING THE GOALS OF GLYCEMIC CONTROL AND POSSIBLE LONG-TERM  COMPLICATIONS.  I  ALSO STRESSED THE IMPORTANCE OF COMPLIANCE WITH HOME GLUCOSE MONITORING, DIETARY RESTRICTIONS INCLUDING AVOIDANCE OF SUGARY DRINKS/PROCESSED FOODS,  ALONG WITH REGULAR EXERCISE.  I  ALSO STRESSED THE IMPORTANCE OF ANNUAL EYE EXAMS, SELF FOOT CARE AND COMPLIANCE WITH OFFICE VISITS.  3. Sinus congestion Comments: Resolving. Advised to avoid dairy products.      Patient was given opportunity to ask questions. Patient verbalized understanding of the plan and was able to repeat key elements of the plan. All questions were answered to their satisfaction.   [name], MD   I, [name], MD, have reviewed all documentation for this visit. The documentation on 10/14/20 for the exam, diagnosis, procedures, and orders are all accurate and complete.  THE PATIENT IS ENCOURAGED TO PRACTICE SOCIAL DISTANCING DUE TO THE COVID-19 PANDEMIC.

## 2020-09-27 NOTE — Patient Instructions (Signed)
Health Maintenance, Female Adopting a healthy lifestyle and getting preventive care are important in promoting health and wellness. Ask your health care provider about:  The right schedule for you to have regular tests and exams.  Things you can do on your own to prevent diseases and keep yourself healthy. What should I know about diet, weight, and exercise? Eat a healthy diet   Eat a diet that includes plenty of vegetables, fruits, low-fat dairy products, and lean protein.  Do not eat a lot of foods that are high in solid fats, added sugars, or sodium. Maintain a healthy weight Body mass index (BMI) is used to identify weight problems. It estimates body fat based on height and weight. Your health care provider can help determine your BMI and help you achieve or maintain a healthy weight. Get regular exercise Get regular exercise. This is one of the most important things you can do for your health. Most adults should:  Exercise for at least 150 minutes each week. The exercise should increase your heart rate and make you sweat (moderate-intensity exercise).  Do strengthening exercises at least twice a week. This is in addition to the moderate-intensity exercise.  Spend less time sitting. Even light physical activity can be beneficial. Watch cholesterol and blood lipids Have your blood tested for lipids and cholesterol at 53 years of age, then have this test every 5 years. Have your cholesterol levels checked more often if:  Your lipid or cholesterol levels are high.  You are older than 53 years of age.  You are at high risk for heart disease. What should I know about cancer screening? Depending on your health history and family history, you may need to have cancer screening at various ages. This may include screening for:  Breast cancer.  Cervical cancer.  Colorectal cancer.  Skin cancer.  Lung cancer. What should I know about heart disease, diabetes, and high blood  pressure? Blood pressure and heart disease  High blood pressure causes heart disease and increases the risk of stroke. This is more likely to develop in people who have high blood pressure readings, are of African descent, or are overweight.  Have your blood pressure checked: ? Every 3-5 years if you are 18-39 years of age. ? Every year if you are 40 years old or older. Diabetes Have regular diabetes screenings. This checks your fasting blood sugar level. Have the screening done:  Once every three years after age 40 if you are at a normal weight and have a low risk for diabetes.  More often and at a younger age if you are overweight or have a high risk for diabetes. What should I know about preventing infection? Hepatitis B If you have a higher risk for hepatitis B, you should be screened for this virus. Talk with your health care provider to find out if you are at risk for hepatitis B infection. Hepatitis C Testing is recommended for:  Everyone born from 1945 through 1965.  Anyone with known risk factors for hepatitis C. Sexually transmitted infections (STIs)  Get screened for STIs, including gonorrhea and chlamydia, if: ? You are sexually active and are younger than 53 years of age. ? You are older than 53 years of age and your health care provider tells you that you are at risk for this type of infection. ? Your sexual activity has changed since you were last screened, and you are at increased risk for chlamydia or gonorrhea. Ask your health care provider if   you are at risk.  Ask your health care provider about whether you are at high risk for HIV. Your health care provider may recommend a prescription medicine to help prevent HIV infection. If you choose to take medicine to prevent HIV, you should first get tested for HIV. You should then be tested every 3 months for as long as you are taking the medicine. Pregnancy  If you are about to stop having your period (premenopausal) and  you may become pregnant, seek counseling before you get pregnant.  Take 400 to 800 micrograms (mcg) of folic acid every day if you become pregnant.  Ask for birth control (contraception) if you want to prevent pregnancy. Osteoporosis and menopause Osteoporosis is a disease in which the bones lose minerals and strength with aging. This can result in bone fractures. If you are 65 years old or older, or if you are at risk for osteoporosis and fractures, ask your health care provider if you should:  Be screened for bone loss.  Take a calcium or vitamin D supplement to lower your risk of fractures.  Be given hormone replacement therapy (HRT) to treat symptoms of menopause. Follow these instructions at home: Lifestyle  Do not use any products that contain nicotine or tobacco, such as cigarettes, e-cigarettes, and chewing tobacco. If you need help quitting, ask your health care provider.  Do not use street drugs.  Do not share needles.  Ask your health care provider for help if you need support or information about quitting drugs. Alcohol use  Do not drink alcohol if: ? Your health care provider tells you not to drink. ? You are pregnant, may be pregnant, or are planning to become pregnant.  If you drink alcohol: ? Limit how much you use to 0-1 drink a day. ? Limit intake if you are breastfeeding.  Be aware of how much alcohol is in your drink. In the [place] one drink equals one 12 oz bottle of beer (355 mL), one 5 oz glass of wine (148 mL), or one 1 oz glass of hard liquor (44 mL). General instructions  Schedule regular health, dental, and eye exams.  Stay current with your vaccines.  Tell your health care provider if: ? You often feel depressed. ? You have ever been abused or do not feel safe at home. Summary  Adopting a healthy lifestyle and getting preventive care are important in promoting health and wellness.  Follow your health care provider's instructions about healthy  diet, exercising, and getting tested or screened for diseases.  Follow your health care provider's instructions on monitoring your cholesterol and blood pressure. This information is not intended to replace advice given to you by your health care provider. Make sure you discuss any questions you have with your health care provider. Document Revised: 10/07/2018 Document Reviewed: 10/07/2018 Elsevier Patient Education  2020 [employer]  

## 2020-09-28 LAB — CMP14+EGFR
ALT: 12 IU/L (ref 0–32)
AST: 12 IU/L (ref 0–40)
Albumin/Globulin Ratio: 1.5 (ref 1.2–2.2)
Albumin: 4.4 g/dL (ref 3.8–4.9)
Alkaline Phosphatase: 100 IU/L (ref 44–121)
BUN/Creatinine Ratio: 13 (ref 9–23)
BUN: 12 mg/dL (ref 6–24)
Bilirubin Total: 0.3 mg/dL (ref 0.0–1.2)
CO2: 26 mmol/L (ref 20–29)
Calcium: 9.4 mg/dL (ref 8.7–10.2)
Chloride: 101 mmol/L (ref 96–106)
Creatinine, Ser: 0.92 mg/dL (ref 0.57–1.00)
GFR calc Af Amer: 82 mL/min/{1.73_m2} (ref >59)
GFR calc non Af Amer: 71 mL/min/{1.73_m2} (ref >59)
Globulin, Total: 2.9 g/dL (ref 1.5–4.5)
Glucose: 81 mg/dL (ref 65–99)
Potassium: 4.1 mmol/L (ref 3.5–5.2)
Sodium: 140 mmol/L (ref 134–144)
Total Protein: 7.3 g/dL (ref 6.0–8.5)

## 2020-09-28 LAB — CBC
Hematocrit: 37.5 % (ref 34.0–46.6)
Hemoglobin: 11.9 g/dL (ref 11.1–15.9)
MCH: 25.6 pg — ABNORMAL LOW (ref 26.6–33.0)
MCHC: 31.7 g/dL (ref 31.5–35.7)
MCV: 81 fL (ref 79–97)
Platelets: 339 10*3/uL (ref 150–450)
RBC: 4.65 x10E6/uL (ref 3.77–5.28)
RDW: 14 % (ref 11.7–15.4)
WBC: 12.5 10*3/uL — ABNORMAL HIGH (ref 3.4–10.8)

## 2020-09-28 LAB — HEMOGLOBIN A1C
Est. average glucose Bld gHb Est-mCnc: 123 mg/dL
Hgb A1c MFr Bld: 5.9 % — ABNORMAL HIGH (ref 4.8–5.6)

## 2020-09-28 LAB — TSH: TSH: 1.62 u[IU]/mL (ref 0.450–4.500)

## 2020-09-29 ENCOUNTER — Telehealth: Payer: Self-pay

## 2020-09-29 NOTE — Telephone Encounter (Signed)
-----   Message from Glendale Chard, MD sent at 09/28/2020  9:49 PM EST ----- Your hba1c is 5.9, this is in prediabetes range. This will improve as you increase your daily activity. Your white count is elevated - how are you feeling? This could be due to inflammation vs. Infection.   Your liver, kidney and thyroid function are normal.

## 2020-10-03 ENCOUNTER — Encounter: Payer: Self-pay | Admitting: Internal Medicine

## 2020-10-10 ENCOUNTER — Ambulatory Visit
Admission: RE | Admit: 2020-10-10 | Discharge: 2020-10-10 | Disposition: A | Discharge status: Home / Self Care | Payer: BC Managed Care – PPO | Source: Ambulatory Visit | Attending: Sports Medicine | Admitting: Sports Medicine

## 2020-10-10 ENCOUNTER — Other Ambulatory Visit: Payer: Self-pay

## 2020-10-10 DIAGNOSIS — M545 Low back pain, unspecified: Secondary | ICD-10-CM

## 2020-10-17 ENCOUNTER — Other Ambulatory Visit: Payer: Self-pay

## 2020-10-17 ENCOUNTER — Encounter: Payer: Self-pay | Admitting: Podiatry

## 2020-10-17 ENCOUNTER — Ambulatory Visit (INDEPENDENT_AMBULATORY_CARE_PROVIDER_SITE_OTHER): Payer: BC Managed Care – PPO | Admitting: Podiatry

## 2020-10-17 DIAGNOSIS — Z9889 Other specified postprocedural states: Secondary | ICD-10-CM | POA: Diagnosis not present

## 2020-10-17 DIAGNOSIS — S86219A Strain of muscle(s) and tendon(s) of anterior muscle group at lower leg level, unspecified leg, initial encounter: Secondary | ICD-10-CM

## 2020-10-17 DIAGNOSIS — M7989 Other specified soft tissue disorders: Secondary | ICD-10-CM | POA: Diagnosis not present

## 2020-10-17 NOTE — Progress Notes (Signed)
She presents today for her final postop visit states that she feels like she is pretty much back to normal.  Date of surgery May 26, 2020 with the tibialis anterior tendon repair and cast.  She denies fever chills nausea vomiting muscle aches pains calf pain back pain chest pain shortness of breath.  States that she did not complete physical therapy that she felt like she can do everything they were doing for her at home.  Objective: Vital signs are stable she is alert oriented x3 she has good dorsiflexion against resistance with tibialis anterior there is minimal edema no erythema cellulitis drainage or odor slightly hypertrophic scar.  Margins of the tendon are sharp and visible.  Assessment: Well-healing surgical foot and ankle.  Plan: At this point I would allow her to get back to her regular routine she is to move up to exercising very slowly notify me with any questions or concerns.  We did discuss silicone scar sheathing.  I will follow-up with her as needed.

## 2020-10-19 ENCOUNTER — Encounter: Payer: Self-pay | Admitting: Nurse Practitioner

## 2020-10-19 ENCOUNTER — Other Ambulatory Visit: Payer: BC Managed Care – PPO

## 2020-10-19 ENCOUNTER — Encounter: Payer: Self-pay | Admitting: Internal Medicine

## 2020-10-19 ENCOUNTER — Telehealth: Payer: Self-pay

## 2020-10-19 NOTE — Telephone Encounter (Signed)
Referral place at infusion clinic

## 2020-10-26 ENCOUNTER — Other Ambulatory Visit: Payer: BC Managed Care – PPO

## 2020-10-31 ENCOUNTER — Ambulatory Visit: Payer: BC Managed Care – PPO

## 2020-10-31 ENCOUNTER — Telehealth: Payer: Self-pay

## 2020-10-31 NOTE — Telephone Encounter (Signed)
I called the pt to let her know that Dr. Allyne Gee said the pt should wait 30 days after covid infection to have a covd booster vaccination

## 2020-11-01 ENCOUNTER — Encounter (HOSPITAL_COMMUNITY): Payer: Self-pay

## 2020-11-06 LAB — HM DIABETES EYE EXAM

## 2020-11-10 ENCOUNTER — Encounter: Payer: Self-pay | Admitting: Internal Medicine

## 2020-11-30 ENCOUNTER — Other Ambulatory Visit: Payer: Self-pay

## 2020-11-30 ENCOUNTER — Encounter: Payer: Self-pay | Admitting: Allergy

## 2020-11-30 ENCOUNTER — Ambulatory Visit (INDEPENDENT_AMBULATORY_CARE_PROVIDER_SITE_OTHER): Payer: BC Managed Care – PPO | Admitting: Allergy

## 2020-11-30 VITALS — BP 128/82 | HR 71 | Temp 97.2°F | Resp 18

## 2020-11-30 DIAGNOSIS — J3089 Other allergic rhinitis: Secondary | ICD-10-CM | POA: Diagnosis not present

## 2020-11-30 DIAGNOSIS — R220 Localized swelling, mass and lump, head: Secondary | ICD-10-CM

## 2020-11-30 DIAGNOSIS — J4540 Moderate persistent asthma, uncomplicated: Secondary | ICD-10-CM | POA: Diagnosis not present

## 2020-11-30 DIAGNOSIS — T783XXD Angioneurotic edema, subsequent encounter: Secondary | ICD-10-CM | POA: Diagnosis not present

## 2020-11-30 NOTE — Progress Notes (Signed)
Follow-up Note  RE: Dana Vasquez MRN: [id number] DOB: [birth year] Date of Office Visit: 11/30/2020   History of present illness: Dana Vasquez is a 55 y.o. female presenting today for follow-up of angioedema and asthma.  She was last seen in the office on 09/06/2020 by myself.  On Friday she went to a seafood restaurant and states she ate 2 chicken lemon pepper weighings, barbecuing, calamari, one shrimp, two crab leg clusters and lobster.  About an hour and a half later she reports developing tongue swelling she took some Benadryl and states the next day she still had a degree of tongue swelling and she took more Benadryl.  She then on Tuesday had about 4 shrimp and crab leg and states nothing happened. She states last week she did take some cetirizine as she had had some nosebleeds and states her nose has been dry.  However she states she has not been taking any antihistamines on a consistent basis. She has not required need of her albuterol inhaler.     Review of systems: Review of Systems  Constitutional: Negative.   HENT:       See HPI  Eyes: Negative.   Respiratory: Negative.   Cardiovascular: Negative.   Gastrointestinal: Negative.   Musculoskeletal: Negative.   Skin: Negative.   Neurological: Negative.     All other systems negative unless noted above in HPI  Past medical/social/surgical/family history have been reviewed and are unchanged unless specifically indicated below.  No changes  Medication List: Current Outpatient Medications  Medication Sig Dispense Refill  . albuterol (VENTOLIN HFA) 108 (90 Base) MCG/ACT inhaler Inhale 2 puffs into the lungs every 6 (six) hours as needed for wheezing or shortness of breath. 18 g 1  . atorvastatin (LIPITOR) 10 MG tablet Take 10 mg by mouth daily.    Marland Kitchen EPINEPHrine (AUVI-Q) 0.3 mg/0.3 mL IJ SOAJ injection Inject 0.3 mg into the muscle as needed for anaphylaxis. As directed for life-threatening allergic reactions 2 each 2   . cetirizine (ZYRTEC) 10 MG tablet Take 1 tablet (10 mg total) by mouth daily. (Patient not taking: Reported on 11/30/2020) 90 tablet 2  . levothyroxine (SYNTHROID) 112 MCG tablet Take 1 tablet (112 mcg total) by mouth daily. 90 tablet 1   No current facility-administered medications for this visit.     Known medication allergies: Allergies  Allergen Reactions  . Amoxicillin Other (See Comments)    Headache Has patient had a PCN reaction causing immediate rash, facial/tongue/throat swelling, SOB or lightheadedness with hypotension: No Has patient had a PCN reaction causing severe rash involving mucus membranes or skin necrosis: No Has patient had a PCN reaction that required hospitalization: No Has patient had a PCN reaction occurring within the last 10 years: Unknown If all of the above answers are "NO", then may proceed with Cephalosporin use.   . Nitrofurantoin Monohyd Macro Other (See Comments)    Pt stated she gets a bad yeast infection  . Dexilant [Dexlansoprazole]     "bad stomachache"  . Meloxicam     Upset stomach and stomach pains  . Budesonide Rash  . Formoterol Rash  . Formoterol Fumarate Rash     Physical examination: Blood pressure 128/82, pulse 71, temperature (!) 97.2 F (36.2 C), temperature source Temporal, resp. rate 18, last menstrual period 04/07/2014, SpO2 98 %.  General: Alert, interactive, in no acute distress. HEENT: PERRLA, TMs pearly gray, turbinates non-edematous without discharge, post-pharynx non erythematous. Neck: Supple without lymphadenopathy. Lungs: Clear  to auscultation without wheezing, rhonchi or rales. {no increased work of breathing. CV: Normal S1, S2 without murmurs. Abdomen: Nondistended, nontender. Skin: Warm and dry, without lesions or rashes. Extremities:  No clubbing, cyanosis or edema. Neuro:   Grossly intact.  Diagnositics/Labs: Labs:  Component     Latest Ref Rng & Units 09/06/2020  IgE (Immunoglobulin E), Serum     6 -  495 IU/mL 49  D Pteronyssinus IgE     Class I kU/L 0.39 (A)  D Farinae IgE     Class I kU/L 0.42 (A)  Cat Dander IgE     Class 0 kU/L <0.10  Dog Dander IgE     Class 0 kU/L <0.10  [place] Grass IgE     Class 0 kU/L <0.10  Timothy Grass IgE     Class 0 kU/L <0.10  Johnson Grass IgE     Class 0 kU/L <0.10  Cockroach, German IgE     Class 0 kU/L <0.10  Penicillium Chrysogen IgE     Class 0 kU/L <0.10  Cladosporium Herbarum IgE     Class 0 kU/L <0.10  Aspergillus Fumigatus IgE     Class 0 kU/L <0.10  Alternaria Alternata IgE     Class 0 kU/L <0.10  Maple/Box Elder IgE     Class 0 kU/L <0.10  Common Silver Wendee Copp IgE     Class 0 kU/L <0.10  Cedar, [place] IgE     Class 0 kU/L <0.10  Oak, White IgE     Class 0 kU/L <0.10  Elm, American IgE     Class 0 kU/L <0.10  Cottonwood IgE     Class 0 kU/L <0.10  Pecan, Hickory IgE     Class 0 kU/L <0.10  White Mulberry IgE     Class 0 kU/L <0.10  Ragweed, Short IgE     Class 0 kU/L <0.10  Pigweed, Rough IgE     Class 0 kU/L <0.10  Sheep Sorrel IgE Qn     Class 0 kU/L <0.10  Mouse Urine IgE     Class 0 kU/L <0.10   Component     Latest Ref Rng & Units 09/06/2020  Tryptase     2.2 - 13.2 ug/L 4.5  Complement C4, Serum     12 - 38 mg/dL 35  C1INH SerPl-mCnc     21 - 39 mg/dL 37  C1INH Functional/C1INH Total MFr SerPl     %mean normal 89  Complement C1Q     10.3 - 20.5 mg/dL 14.8   Assessment and plan:   Episodic swelling (angioedema)  -At this time cause of angioedema is still unclear.  She did have episodes of angioedema but seem to be tied to use of NSAIDs/opioid medications which can cause histamine release from allergy cells than can lead to itching, hives and or swelling.  Would recommend she still avoid NSAIDs/opiates at this time.  If needing pain control use either Tylenol.  Can ask your PCP or orthopedist about Ultram; Ultram is usually recommended in those who have adverse events with NSAIDs/opioid medications  as this is typically well tolerated.    -Angioedema work-up was normal for the hereditary angioedema panel   -Environmental allergy panel did show sensitivity to dust mites  -Tryptase level is normal thus she does not have "hyperreactive" allergy cells"  -Her previous lab work did show low level sensitivity to shrimp however she has had shrimp on occasion even recently without symptom onset which would be  inconsistent with a true IgE mediated shrimp allergy.  However I am more concerned about her dust mite sensitivity as the structure of dust mite can appear very similar to the structure of crustaceans and thus people can have symptoms with shellfish ingestion who are dust mite allergic.  This would make the most sense and correlation with her angioedema following shellfish ingestion.  I did discuss with her that if we got rid of her dust mite sensitivity through either allergen immunotherapy via subcutaneous route versus sublingual route that this would be a potential way to tolerate shellfish ingestion without concern for angioedema or further allergic reaction symptoms.  Until this occurs I would recommend she avoid shellfish as she had rest the potential to have symptoms due to the dust mite sensitivity.   -I have recommended that she do take a long-acting antihistamine on a consistent basis cetirizine 10mg  1 tab 1-2 times daily and to couple this with Pepcid use which has secondary antihistamine properties.  -She should continue to have access to an epinephrine device at this time and follow the emergency action plan previously provided  Asthma  - have access to albuterol inhaler 2 puffs every 4-6 hours as needed for cough/wheeze/shortness of breath/chest tightness.  May use 15-20 minutes prior to activity.   Monitor frequency of use.    Allergic rhinitis - zyrtec as above -Dust mite avoidance measures in place -I did recommend that she return for a skin testing visit for shellfish and and  further food testing to ensure that she does not have any IgE to any other food items as well as 2 other environmental allergens besides dust mites.   Follow-up in 3 months or sooner if needed   I appreciate the opportunity to take part in Faithe's care. Please do not hesitate to contact me with questions.  Sincerely,   Prudy Feeler, MD Allergy/Immunology [facility] [place]

## 2020-11-30 NOTE — Patient Instructions (Addendum)
Episodic swelling (angioedema)  -At this time cause of angioedema is still unclear.  She did have episodes of angioedema but seem to be tied to use of NSAIDs/opioid medications which can cause histamine release from allergy cells than can lead to itching, hives and or swelling.  Would recommend she still avoid NSAIDs/opiates at this time.  If needing pain control use either Tylenol.  Can ask your PCP or orthopedist about Ultram; Ultram is usually recommended in those who have adverse events with NSAIDs/opioid medications as this is typically well tolerated.    -Angioedema work-up was normal for the hereditary angioedema panel   -Environmental allergy panel did show sensitivity to dust mites  -Tryptase level is normal thus she does not have "hyperreactive" allergy cells"  -Her previous lab work did show low level sensitivity to shrimp however she has had shrimp on occasion even recently without symptom onset which would be inconsistent with a true IgE mediated shrimp allergy.  However I am more concerned about her dust mite sensitivity as the structure of dust mite can appear very similar to the structure of crustaceans and thus people can have symptoms with shellfish ingestion who are dust mite allergic.  This would make the most sense and correlation with her angioedema following shellfish ingestion.  I did discuss with her that if we got rid of her dust mite sensitivity through either allergen immunotherapy via subcutaneous route versus sublingual route that this would be a potential way to tolerate shellfish ingestion without concern for angioedema or further allergic reaction symptoms.  Until this occurs I would recommend she avoid shellfish as she had rest the potential to have symptoms due to the dust mite sensitivity.   -I have recommended that she do take a long-acting antihistamine on a consistent basis cetirizine 10mg  1 tab 1-2 times daily and to couple this with Pepcid use which has secondary  antihistamine properties.  -She should continue to have access to an epinephrine device at this time and follow the emergency action plan previously provided  Asthma  - have access to albuterol inhaler 2 puffs every 4-6 hours as needed for cough/wheeze/shortness of breath/chest tightness.  May use 15-20 minutes prior to activity.   Monitor frequency of use.    Allergic rhinitis - zyrtec as above -Dust mite avoidance measures in place -I did recommend that she return for a skin testing visit for shellfish and and further food testing to ensure that she does not have any IgE to any other food items as well as 2 other environmental allergens besides dust mites.   Follow-up in 3 months or sooner if needed

## 2020-12-21 ENCOUNTER — Other Ambulatory Visit: Payer: Self-pay | Admitting: Family Medicine

## 2020-12-21 ENCOUNTER — Other Ambulatory Visit: Payer: Self-pay

## 2020-12-21 ENCOUNTER — Encounter: Payer: Self-pay | Admitting: Family Medicine

## 2020-12-21 ENCOUNTER — Ambulatory Visit (INDEPENDENT_AMBULATORY_CARE_PROVIDER_SITE_OTHER): Payer: BC Managed Care – PPO | Admitting: Family Medicine

## 2020-12-21 VITALS — BP 120/70 | HR 64 | Temp 97.3°F | Resp 16 | Ht 65.0 in | Wt 224.8 lb

## 2020-12-21 DIAGNOSIS — T7800XD Anaphylactic reaction due to unspecified food, subsequent encounter: Secondary | ICD-10-CM | POA: Diagnosis not present

## 2020-12-21 DIAGNOSIS — R040 Epistaxis: Secondary | ICD-10-CM

## 2020-12-21 DIAGNOSIS — T783XXD Angioneurotic edema, subsequent encounter: Secondary | ICD-10-CM

## 2020-12-21 DIAGNOSIS — T7800XA Anaphylactic reaction due to unspecified food, initial encounter: Secondary | ICD-10-CM

## 2020-12-21 DIAGNOSIS — J3089 Other allergic rhinitis: Secondary | ICD-10-CM | POA: Diagnosis not present

## 2020-12-21 DIAGNOSIS — J4520 Mild intermittent asthma, uncomplicated: Secondary | ICD-10-CM

## 2020-12-21 MED ORDER — CETIRIZINE HCL 10 MG PO TABS
10.0000 mg | ORAL_TABLET | Freq: Every day | ORAL | 2 refills | Status: DC
Start: 2020-12-21 — End: 2023-04-15

## 2020-12-21 MED ORDER — ALBUTEROL SULFATE HFA 108 (90 BASE) MCG/ACT IN AERS: 2.0000 | INHALATION_SPRAY | Freq: Four times a day (QID) | RESPIRATORY_TRACT | 1 refills | Status: DC | PRN

## 2020-12-21 MED ORDER — EPINEPHRINE 0.3 MG/0.3ML IJ SOAJ: 0.3000 mg | INTRAMUSCULAR | 2 refills | Status: DC | PRN

## 2020-12-21 NOTE — Progress Notes (Signed)
[place] [address] Dept: [phone]  FOLLOW UP NOTE  Patient ID: Dana Vasquez, female    DOB: 06-Apr-1966  Age: 55 y.o. MRN: [id number] Date of Office Visit: 12/21/2020  Assessment  Chief Complaint: Allergy Testing (Tongue, lips -swelling /Sneezing, nose bleeds, drainage clear mucous )  HPI Dana Vasquez is a 55 year old female who presents to the clinic for follow-up visit with allergy skin testing. She was last seen in this clinic on 11/30/2020 by Dr. Nelva Bush for evaluation of angioedema, asthma, and allergic rhinitis.  At today's visit she reports she is feeling well overall.  She reports her asthma has been well controlled with no shortness of breath, cough, or wheeze with activity or rest.  She reports that she uses her albuterol about once every 2 months with relief of symptoms.  Allergic rhinitis is reported as moderately well controlled with symptoms including sneezing, postnasal drainage, and clear rhinorrhea.  She reports that she has recently moved into the guest room for sleeping as there is still carpet in her bedroom.  She continues cetirizine as needed and is not currently using Flonase or nasal saline rinse.  She does report cetirizine makes her sleepy in the daytime.  She is not currently avoiding any foods.  She reports 3 separate occasions where she experienced tongue swelling.  The first 2 episodes were resolved with Benadryl and the last episode required administration of Benadryl and epinephrine with symptoms resolving and 48 hours.  She does report frequent episodes of epistaxis, about 3-4 times a week, which occasionally take greater than 5 minutes to stop bleeding.  Her current medications are listed in the chart.   Drug Allergies:  Allergies  Allergen Reactions  . Amoxicillin Other (See Comments)    Headache Has patient had a PCN reaction causing immediate rash, facial/tongue/throat swelling, SOB or lightheadedness with hypotension:  No Has patient had a PCN reaction causing severe rash involving mucus membranes or skin necrosis: No Has patient had a PCN reaction that required hospitalization: No Has patient had a PCN reaction occurring within the last 10 years: Unknown If all of the above answers are "NO", then may proceed with Cephalosporin use.   . Nitrofurantoin Monohyd Macro Other (See Comments)    Pt stated she gets a bad yeast infection  . Dexilant [Dexlansoprazole]     "bad stomachache"  . Meloxicam     Upset stomach and stomach pains  . Budesonide Rash  . Formoterol Rash  . Formoterol Fumarate Rash    Physical Exam: BP 120/70 (BP Location: Left Arm, Patient Position: Sitting)   Pulse 64   Temp (!) 97.3 F (36.3 C)   Resp 16   Ht 5\' 5"  (1.651 m)   Wt 224 lb 12.8 oz (102 kg)   LMP 04/07/2014   SpO2 99%   BMI 37.41 kg/m    Physical Exam Vitals reviewed.  Constitutional:      Appearance: Normal appearance.  HENT:     Head: Normocephalic and atraumatic.     Right Ear: Tympanic membrane normal.     Left Ear: Tympanic membrane normal.     Nose:     Comments: Bilateral nares slightly erythematous with clear nasal drainage noted.  Pharynx normal.  Ears normal.  Eyes normal.    Mouth/Throat:     Pharynx: Oropharynx is clear.  Eyes:     Conjunctiva/sclera: Conjunctivae normal.  Cardiovascular:     Rate and Rhythm: Normal rate and regular rhythm.  Heart sounds: Normal heart sounds. No murmur heard.   Pulmonary:     Effort: Pulmonary effort is normal.     Breath sounds: Normal breath sounds.     Comments: Lungs clear to auscultation Musculoskeletal:        General: Normal range of motion.     Cervical back: Normal range of motion and neck supple.  Skin:    General: Skin is warm and dry.  Neurological:     Mental Status: She is alert and oriented to person, place, and time.  Psychiatric:        Mood and Affect: Mood normal.        Behavior: Behavior normal.        Thought Content:  Thought content normal.        Judgment: Judgment normal.     Diagnostics: FVC 2.34, FEV1 1.94.  Predicted FVC 3.08, predicted FEV1 2.45.  Spirometry indicates mild restriction.  This is consistent with previous spirometry readings.  Assessment and Plan: 1. Angioedema, subsequent encounter   2. Mild intermittent asthma without complication   3. Allergic rhinitis   4. Allergy with anaphylaxis due to food   5. Epistaxis     Meds ordered this encounter  Medications  . albuterol (VENTOLIN HFA) 108 (90 Base) MCG/ACT inhaler    Sig: Inhale 2 puffs into the lungs every 6 (six) hours as needed for wheezing or shortness of breath.    Dispense:  18 g    Refill:  1  . cetirizine (ZYRTEC) 10 MG tablet    Sig: Take 1 tablet (10 mg total) by mouth daily.    Dispense:  90 tablet    Refill:  2  . EPINEPHrine (AUVI-Q) 0.3 mg/0.3 mL IJ SOAJ injection    Sig: Inject 0.3 mg into the muscle as needed for anaphylaxis. As directed for life-threatening allergic reactions    Dispense:  2 each    Refill:  2    [phone]    Patient Instructions  Asthma Continue albuterol 2 puffs once every 4 hours as needed for cough or wheeze You may use albuterol 2 puffs 5 to 15 minutes before activity to decrease cough or wheeze  Allergic rhinitis Your environmental allergy testing was positive to dust mite and weed pollen at today's visit.   All continue allergen avoidance measures directed toward dust mite and weed pollen. Allergen avoidance measures are listed below Continue cetirizine 10 mg once a day as needed for runny nose or itch If medications do not control your symptoms, consider allergen immunotherapy  Angioedema Continue cetirizine 10 mg once a day as listed above Continue famotidine 20 mg twice a day  Epistaxis Pinch both nostrils while leaning forward for at least 5 minutes before checking to see if the bleeding has stopped. If bleeding is not controlled within 5-10 minutes apply a cotton  ball soaked with oxymetazoline (Afrin) to the bleeding nostril for a few seconds.  If the problem persists or worsens a referral to ENT for further evaluation may be necessary.  Food allergy Your allergy testing was positive to shrimp and fish today. Begin strict avoidance of shrimp and fish.  In case of an allergic reaction, take Benadryl 50 mg every 4 hours, and if life-threatening symptoms occur, inject with AuviQ 0.3 mg.  Call the clinic if this treatment plan is not working well for you  Follow up in 6 months or sooner if needed.   Return in about 6 months (around 06/20/2021), or if  symptoms worsen or fail to improve.    Thank you for the opportunity to care for this patient.  Please do not hesitate to contact me with questions.  Gareth Morgan, FNP [facility]

## 2020-12-21 NOTE — Patient Instructions (Addendum)
Asthma Continue albuterol 2 puffs once every 4 hours as needed for cough or wheeze You may use albuterol 2 puffs 5 to 15 minutes before activity to decrease cough or wheeze  Allergic rhinitis Your environmental allergy testing was positive to dust mite and weed pollen at today's visit.   Continue allergen avoidance measures directed toward dust mite and weed pollen. Allergen avoidance measures are listed below Continue cetirizine 10 mg once a day as needed for runny nose or itch If medications do not control your symptoms, consider allergen immunotherapy  Angioedema Continue cetirizine 10 mg once a day as listed above Continue famotidine 20 mg twice a day  Epistaxis Pinch both nostrils while leaning forward for at least 5 minutes before checking to see if the bleeding has stopped. If bleeding is not controlled within 5-10 minutes apply a cotton ball soaked with oxymetazoline (Afrin) to the bleeding nostril for a few seconds.  If the problem persists or worsens a referral to ENT for further evaluation may be necessary.  Food allergy Your allergy testing was positive to shrimp and fish today.  Begin strict avoidance of shrimp and fish.  In case of an allergic reaction, take Benadryl 50 mg every 4 hours, and if life-threatening symptoms occur, inject with AuviQ 0.3 mg.  Call the clinic if this treatment plan is not working well for you  Follow up in 6 months or sooner if needed.   Control of Dust Mite Allergen Dust mites play a major role in allergic asthma and rhinitis. They occur in environments with high humidity wherever human skin is found. Dust mites absorb humidity from the atmosphere (ie, they do not drink) and feed on organic matter (including shed human and animal skin). Dust mites are a microscopic type of insect that you cannot see with the naked eye. High levels of dust mites have been detected from mattresses, pillows, carpets, upholstered furniture, bed covers, clothes, soft  toys and any woven material. The principal allergen of the dust mite is found in its feces. A gram of dust may contain 1,000 mites and 250,000 fecal particles. Mite antigen is easily measured in the air during house cleaning activities. Dust mites do not bite and do not cause harm to humans, other than by triggering allergies/asthma.  Ways to decrease your exposure to dust mites in your home:  1. Encase mattresses, box springs and pillows with a mite-impermeable barrier or cover  2. Wash sheets, blankets and drapes weekly in hot water (130 F) with detergent and dry them in a dryer on the hot setting.  3. Have the room cleaned frequently with a vacuum cleaner and a damp dust-mop. For carpeting or rugs, vacuuming with a vacuum cleaner equipped with a high-efficiency particulate air (HEPA) filter. The dust mite allergic individual should not be in a room which is being cleaned and should wait 1 hour after cleaning before going into the room.  4. Do not sleep on upholstered furniture (eg, couches).  5. If possible removing carpeting, upholstered furniture and drapery from the home is ideal. Horizontal blinds should be eliminated in the rooms where the person spends the most time (bedroom, study, television room). Washable vinyl, roller-type shades are optimal.  6. Remove all non-washable stuffed toys from the bedroom. Wash stuffed toys weekly like sheets and blankets above.  7. Reduce indoor humidity to less than 50%. Inexpensive humidity monitors can be purchased at most hardware stores. Do not use a humidifier as can make the problem worse and  are not recommended.  Reducing Pollen Exposure The American Academy of Allergy, Asthma and Immunology suggests the following steps to reduce your exposure to pollen during allergy seasons. 1. Do not hang sheets or clothing out to dry; pollen may collect on these items. 2. Do not mow lawns or spend time around freshly cut grass; mowing stirs up  pollen. 3. Keep windows closed at night.  Keep car windows closed while driving. 4. Minimize morning activities outdoors, a time when pollen counts are usually at their highest. 5. Stay indoors as much as possible when pollen counts or humidity is high and on windy days when pollen tends to remain in the air longer. 6. Use air conditioning when possible.  Many air conditioners have filters that trap the pollen spores. 7. Use a HEPA room air filter to remove pollen form the indoor air you breathe.

## 2021-01-26 ENCOUNTER — Ambulatory Visit: Payer: BC Managed Care – PPO | Admitting: Nurse Practitioner

## 2021-01-26 ENCOUNTER — Other Ambulatory Visit: Payer: Self-pay

## 2021-01-26 VITALS — BP 124/80 | HR 69 | Temp 98.3°F | Ht 65.0 in | Wt 214.4 lb

## 2021-01-26 DIAGNOSIS — M79621 Pain in right upper arm: Secondary | ICD-10-CM | POA: Diagnosis not present

## 2021-01-26 MED ORDER — TRAMADOL HCL 50 MG PO TABS: 50.0000 mg | ORAL_TABLET | Freq: Four times a day (QID) | ORAL | 0 refills | Status: DC | PRN
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
## 2021-03-06 ENCOUNTER — Other Ambulatory Visit: Payer: Self-pay | Admitting: Podiatry

## 2021-03-06 ENCOUNTER — Ambulatory Visit (INDEPENDENT_AMBULATORY_CARE_PROVIDER_SITE_OTHER): Payer: BC Managed Care – PPO

## 2021-03-06 ENCOUNTER — Ambulatory Visit: Payer: BC Managed Care – PPO | Admitting: Podiatry

## 2021-03-06 ENCOUNTER — Encounter: Payer: Self-pay | Admitting: Podiatry

## 2021-03-06 ENCOUNTER — Other Ambulatory Visit: Payer: Self-pay

## 2021-03-06 DIAGNOSIS — M722 Plantar fascial fibromatosis: Secondary | ICD-10-CM | POA: Diagnosis not present

## 2021-03-06 DIAGNOSIS — Z9884 Bariatric surgery status: Secondary | ICD-10-CM | POA: Insufficient documentation

## 2021-03-06 DIAGNOSIS — M7751 Other enthesopathy of right foot: Secondary | ICD-10-CM

## 2021-03-06 DIAGNOSIS — C73 Malignant neoplasm of thyroid gland: Secondary | ICD-10-CM | POA: Insufficient documentation

## 2021-03-06 DIAGNOSIS — E890 Postprocedural hypothyroidism: Secondary | ICD-10-CM | POA: Insufficient documentation

## 2021-03-06 MED ORDER — METHYLPREDNISOLONE 4 MG PO TBPK
ORAL_TABLET | ORAL | 0 refills | Status: DC
Start: 2021-03-06 — End: 2021-04-17

## 2021-03-06 MED ORDER — CELECOXIB 100 MG PO CAPS: 100.0000 mg | ORAL_CAPSULE | Freq: Two times a day (BID) | ORAL | 3 refills | Status: DC

## 2021-03-06 MED ORDER — TRIAMCINOLONE ACETONIDE 40 MG/ML IJ SUSP
20.0000 mg | Freq: Once | INTRAMUSCULAR | Status: AC
  Administered 2021-03-06: 20 mg

## 2021-03-06 NOTE — Progress Notes (Signed)
She presents today states that the dorsal and dorsal lateral aspect of her right foot has been painful.  States is been aching for the past several months morning pain with throbbing start getting some swelling from a previous surgery she is tried ibuprofen massage different shoes no injury range of motion of the ankle is just stiff feeling previous surgery for tibialis anterior repair was 730 of last year.  Objective: Vital signs stable alert oriented x3.  Pulses are palpable.  She has pain on palpation medial calcaneal tubercle of her right heel with some tenderness on palpation of the posterior tibial tendon as well as the tibialis anterior tendon.  She has some tenderness on palpation of the fourth fifth tarsometatarsal joint and on particular range of motion of this area.  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  Plan: Started on Medrol Dosepak and Celebrex as well as inject in the heel today.  Tolerated procedure well without complications I will follow-up with her in 1 month if necessary.

## 2021-04-11 ENCOUNTER — Ambulatory Visit: Payer: BC Managed Care – PPO | Admitting: Internal Medicine

## 2021-04-12 ENCOUNTER — Ambulatory Visit: Payer: BC Managed Care – PPO | Admitting: Internal Medicine

## 2021-04-12 ENCOUNTER — Encounter: Payer: Self-pay | Admitting: Internal Medicine

## 2021-04-12 ENCOUNTER — Other Ambulatory Visit: Payer: Self-pay

## 2021-04-12 VITALS — BP 124/80 | HR 69 | Temp 98.3°F | Ht 65.0 in | Wt 217.8 lb

## 2021-04-12 DIAGNOSIS — D573 Sickle-cell trait: Secondary | ICD-10-CM

## 2021-04-12 DIAGNOSIS — E1169 Type 2 diabetes mellitus with other specified complication: Secondary | ICD-10-CM

## 2021-04-12 DIAGNOSIS — E669 Obesity, unspecified: Secondary | ICD-10-CM | POA: Diagnosis not present

## 2021-04-12 DIAGNOSIS — Z23 Encounter for immunization: Secondary | ICD-10-CM

## 2021-04-12 DIAGNOSIS — E6609 Other obesity due to excess calories: Secondary | ICD-10-CM | POA: Insufficient documentation

## 2021-04-12 DIAGNOSIS — I83812 Varicose veins of left lower extremities with pain: Secondary | ICD-10-CM | POA: Diagnosis not present

## 2021-04-12 DIAGNOSIS — E890 Postprocedural hypothyroidism: Secondary | ICD-10-CM

## 2021-04-12 DIAGNOSIS — Z6836 Body mass index (BMI) 36.0-36.9, adult: Secondary | ICD-10-CM

## 2021-04-12 LAB — POCT UA - MICROALBUMIN
Albumin/Creatinine Ratio, Urine, POC: 30
Creatinine, POC: 200 mg/dL
Microalbumin Ur, POC: 10 mg/L

## 2021-04-12 MED ORDER — SHINGRIX 50 MCG/0.5ML IM SUSR: 0.5000 mL | Freq: Once | INTRAMUSCULAR | 0 refills | Status: AC

## 2021-04-12 NOTE — Progress Notes (Signed)
I,Yamilka Roman [employer] as a Education administrator for [name], MD.,have documented all relevant documentation on the behalf of [name], MD,as directed by  [name], MD while in the presence of [name], MD.  This visit occurred during the SARS-CoV-2 public health emergency.  Safety protocols were in place, including screening questions prior to the visit, additional usage of staff PPE, and extensive cleaning of exam room while observing appropriate contact time as indicated for disinfecting solutions.  Subjective:     Patient ID: Dana Vasquez , female    DOB: January 07, 1966 , 55 y.o.   MRN: [id number]   Chief Complaint  Patient presents with   Hypothyroidism   Diabetes    HPI  Patient presents today for a thyroid and diabetes f/u. She reports compliance with thyroid meds. She is controlling diabetes with lifestyle changes. She is no longer followed by Dr. Chalmers Cater, has h/o thyroid cancer.     Diabetes She presents for her follow-up diabetic visit. She has type 2 diabetes mellitus. Her disease course has been stable. There are no hypoglycemic associated symptoms. Pertinent negatives for diabetes include no blurred vision and no chest pain. There are no hypoglycemic complications. Risk factors for coronary artery disease include diabetes mellitus, dyslipidemia and obesity. Current diabetic treatment includes diet.  Hypertension This is a chronic problem. The current episode started more than 1 year ago. The problem has been gradually improving since onset. The problem is controlled. Pertinent negatives include no blurred vision, chest pain, palpitations or shortness of breath.    Past Medical History:  Diagnosis Date   Allergy    Anemia    Arthritis    knees  AND SHOULDERS   Asthma    allergy related - rarely uses inhaler   Bruises easily    Bunion    Cancer ([place])    1995;thyroid cancer, SURGERY AND RADIOACTIVE IODINE   Ectopic pregnancy    Euthyroid    Fatigue     Fibromyalgia    GERD (gastroesophageal reflux disease)    Headache(784.0)    otc med prn - last one 02/2014   History of blood transfusion 2000   In [place], [place] at [place] - ? 4 units transfused   Hypothyroidism    Knee pain    Plantar fasciitis    Sleep apnea    uses CPAP   SVD (spontaneous vaginal delivery)    x 3   Thyroid disease    thyroidectomy 1995     Family History  Problem Relation Age of Onset   Hypertension Mother    Cancer Mother        uterine   Cancer Father    Cancer Brother        stomach; 61   Early death Brother    Stroke Maternal Uncle    Heart disease Maternal Uncle    Food Allergy Daughter    Allergic rhinitis Daughter    Allergic rhinitis Daughter    Angioedema Neg Hx    Asthma Neg Hx    Eczema Neg Hx    Immunodeficiency Neg Hx    Urticaria Neg Hx    Atopy Neg Hx      Current Outpatient Medications:    albuterol (VENTOLIN HFA) 108 (90 Base) MCG/ACT inhaler, INHALE 2 PUFFS INTO THE LUNGS EVERY 6 HOURS AS NEEDED FOR WHEEZING OR SHORTNESS OF BREATH, Disp: 54 g, Rfl: 3   atorvastatin (LIPITOR) 10 MG tablet, Take 10 mg by mouth daily.,  Disp: , Rfl:    celecoxib (CELEBREX) 100 MG capsule, Take 1 capsule (100 mg total) by mouth 2 (two) times daily., Disp: 60 capsule, Rfl: 3   cetirizine (ZYRTEC) 10 MG tablet, Take 1 tablet (10 mg total) by mouth daily., Disp: 90 tablet, Rfl: 2   EPINEPHrine (AUVI-Q) 0.3 mg/0.3 mL IJ SOAJ injection, Inject 0.3 mg into the muscle as needed for anaphylaxis. As directed for life-threatening allergic reactions, Disp: 2 each, Rfl: 2   levothyroxine (SYNTHROID) 112 MCG tablet, Take 1 tablet (112 mcg total) by mouth daily., Disp: 90 tablet, Rfl: 1   methylPREDNISolone (MEDROL DOSEPAK) 4 MG TBPK tablet, 6 day dose pack - take as directed, Disp: 21 tablet, Rfl: 0   traMADol (ULTRAM) 50 MG tablet, Take 1 tablet (50 mg total) by mouth every 6 (six) hours as needed., Disp: 20 tablet, Rfl: 0   Allergies  Allergen Reactions    Amoxicillin Other (See Comments)    Headache Has patient had a PCN reaction causing immediate rash, facial/tongue/throat swelling, SOB or lightheadedness with hypotension: No Has patient had a PCN reaction causing severe rash involving mucus membranes or skin necrosis: No Has patient had a PCN reaction that required hospitalization: No Has patient had a PCN reaction occurring within the last 10 years: Unknown If all of the above answers are "NO", then may proceed with Cephalosporin use.    Nitrofurantoin Monohyd Macro Other (See Comments)    Pt stated she gets a bad yeast infection   Dexilant [Dexlansoprazole]     "bad stomachache"   Meloxicam     Upset stomach and stomach pains   Budesonide Rash   Formoterol Rash   Formoterol Fumarate Rash     Review of Systems  Constitutional: Negative.   Eyes:  Negative for blurred vision.  Respiratory: Negative.  Negative for shortness of breath.   Cardiovascular: Negative.  Negative for chest pain and palpitations.  Gastrointestinal: Negative.   Neurological: Negative.   Psychiatric/Behavioral: Negative.      Today's Vitals   04/12/21 0842  BP: 124/80  Pulse: 69  Temp: 98.3 F (36.8 C)  Weight: 217 lb 12.8 oz (98.8 kg)  Height: _0  (1.651 m)  PainSc: 0-No pain   Body mass index is 36.24 kg/m.  Wt Readings from Last 3 Encounters:  04/12/21 217 lb 12.8 oz (98.8 kg)  01/26/21 214 lb 6.4 oz (97.3 kg)  12/21/20 224 lb 12.8 oz (102 kg)     Objective:  Physical Exam Vitals and nursing note reviewed.  Constitutional:      Appearance: Normal appearance.  HENT:     Head: Normocephalic and atraumatic.     Nose:     Comments: Masked     Mouth/Throat:     Comments: Masked  Eyes:     Extraocular Movements: Extraocular movements intact.  Cardiovascular:     Rate and Rhythm: Normal rate and regular rhythm.     Heart sounds: Normal heart sounds.  Pulmonary:     Effort: Pulmonary effort is normal.     Breath sounds: Normal breath  sounds.  Musculoskeletal:     Cervical back: Normal range of motion.  Skin:    General: Skin is warm.  Neurological:     General: No focal deficit present.     Mental Status: She is alert.  Psychiatric:        Mood and Affect: Mood normal.        Behavior: Behavior normal.  Assessment And Plan:     1. Postoperative hypothyroidism Comments: I will check thyroid panel and adjust meds as needed.  - TSH - T3, free - T4, Free  2. Diabetes mellitus type 2 in obese [facility]) Comments: Controlled off of meds. I will check an a1c today.  She is encouraged to limit intake of sweetened beverages.  - Hemoglobin A1c - POCT UA - Microalbumin - CMP14+EGFR  3. Varicose veins of left lower extremity with pain Comments: I will refer her to Vascular for further evaluation. Advised to wear compression hose and take Mg nightly. - Ambulatory referral to Vascular Surgery  4. Sickle cell trait (HCC) Comments: Chronic, stable. She is encouraged to stay well hydrated.   5. Class 2 severe obesity due to excess calories with serious comorbidity and body mass index (BMI) of 36.0 to 36.9 in adult [facility]) Comments: She is encouraged to strive for BMI less than 30 to decrease cardiac risk. Advised to aim for at least 150 minutes of exercise per week.   6. Immunization due Comments: I will send rx Shingrix to her local pharmacy.     Patient was given opportunity to ask questions. Patient verbalized understanding of the plan and was able to repeat key elements of the plan. All questions were answered to their satisfaction.   I, [name], MD, have reviewed all documentation for this visit. The documentation on 04/15/21 for the exam, diagnosis, procedures, and orders are all accurate and complete.   IF YOU HAVE BEEN REFERRED TO A SPECIALIST, IT MAY TAKE 1-2 WEEKS TO SCHEDULE/PROCESS THE REFERRAL. IF YOU HAVE NOT HEARD FROM US/SPECIALIST IN TWO WEEKS, PLEASE GIVE [place] A CALL AT [phone].    THE PATIENT IS ENCOURAGED TO PRACTICE SOCIAL DISTANCING DUE TO THE COVID-19 PANDEMIC.

## 2021-04-12 NOTE — Patient Instructions (Signed)
Diabetes Mellitus Basics  Diabetes mellitus, or diabetes, is a long-term (chronic) disease. It occurs when the body does not properly use sugar (glucose) that is released from food after you eat. Diabetes mellitus may be caused by one or both of these problems: Your pancreas does not make enough of a hormone called insulin. Your body does not react in a normal way to the insulin that it makes. Insulin lets glucose enter cells in your body. This gives you energy. If you have diabetes, glucose cannot get into cells. This causes high blood glucose (hyperglycemia). How to treat and manage diabetes You may need to take insulin or other diabetes medicines daily to keep your glucose in balance. If you are prescribed insulin, you will learn how to give yourself insulin by injection. You may need to adjust the amount of insulin youtake based on the foods that you eat. You will need to check your blood glucose levels using a glucose monitor as told by your health care provider. The readings can help determine if you havelow or high blood glucose. Generally, you should have these blood glucose levels: Before meals (preprandial): 80-130 mg/dL (4.4-7.2 mmol/L). After meals (postprandial): below 180 mg/dL (10 mmol/L). Hemoglobin A1c (HbA1c) level: less than 7%. Your health care provider will set treatment goals for you. Keep all follow-up visits. This is important. Follow these instructions at home: Diabetes medicines Take your diabetes medicines every day as told by your health care provider. List your diabetes medicines here: Name of medicine: ______________________________ Amount (dose): _______________ Time (a.m./p.m.): _______________ Notes: ___________________________________ Name of medicine: ______________________________ Amount (dose): _______________ Time (a.m./p.m.): _______________ Notes: ___________________________________ Name of medicine: ______________________________ Amount (dose):  _______________ Time (a.m./p.m.): _______________ Notes: ___________________________________ Insulin If you use insulin, list the types of insulin you use here: Insulin type: ______________________________ Amount (dose): _______________ Time (a.m./p.m.): _______________Notes: ___________________________________ Insulin type: ______________________________ Amount (dose): _______________ Time (a.m./p.m.): _______________ Notes: ___________________________________ Insulin type: ______________________________ Amount (dose): _______________ Time (a.m./p.m.): _______________ Notes: ___________________________________ Insulin type: ______________________________ Amount (dose): _______________ Time (a.m./p.m.): _______________ Notes: ___________________________________ Insulin type: ______________________________ Amount (dose): _______________ Time (a.m./p.m.): _______________ Notes: ___________________________________ Managing blood glucose  Check your blood glucose levels using a glucose monitor as told by your healthcare provider. Write down the times that you check your glucose levels here: Time: _______________ Notes: ___________________________________ Time: _______________ Notes: ___________________________________ Time: _______________ Notes: ___________________________________ Time: _______________ Notes: ___________________________________ Time: _______________ Notes: ___________________________________ Time: _______________ Notes: ___________________________________  Low blood glucose Low blood glucose (hypoglycemia) is when glucose is at or below 70 mg/dL (3.9 mmol/L). Symptoms may include: Feeling: Hungry. Sweaty and clammy. Irritable or easily upset. Dizzy. Sleepy. Having: A fast heartbeat. A headache. A change in your vision. Numbness around the mouth, lips, or tongue. Having trouble with: Moving (coordination). Sleeping. Treating low blood glucose To treat low blood  glucose, eat or drink something containing sugar right away. If you can think clearly and swallow safely, follow the 15:15 rule: Take 15 grams of a fast-acting carb (carbohydrate), as told by your health care provider. Some fast-acting carbs are: Glucose tablets: take 3-4 tablets. Hard candy: eat 3-5 pieces. Fruit juice: drink 4 oz (120 mL). Regular (not diet) soda: drink 4-6 oz (120-180 mL). Honey or sugar: eat 1 Tbsp (15 mL). Check your blood glucose levels 15 minutes after you take the carb. If your glucose is still at or below 70 mg/dL (3.9 mmol/L), take 15 grams of a carb again. If your glucose does not go above 70 mg/dL (3.9 mmol/L) after 3 tries, get  help right away. After your glucose goes back to normal, eat a meal or a snack within 1 hour. Treating very low blood glucose If your glucose is at or below 54 mg/dL (3 mmol/L), you have very low blood glucose (severe hypoglycemia). This is an emergency. Do not wait to see if the symptoms will go away. Get medical help right away. Call your local emergency services (911 in the [place] Do not drive yourself to the hospital. Questions to ask your health care provider Should I talk with a diabetes educator? What equipment will I need to care for myself at home? What diabetes medicines do I need? When should I take them? How often do I need to check my blood glucose levels? What number can I call if I have questions? When is my follow-up visit? Where can I find a support group for people with diabetes? Where to find more information American Diabetes Association: www.diabetes.org Association of Diabetes Care and Education Specialists: www.diabeteseducator.org Contact a health care provider if: Your blood glucose is at or above 240 mg/dL (13.3 mmol/L) for 2 days in a row. You have been sick or have had a fever for 2 days or more, and you are not getting better. You have any of these problems for more than 6 hours: You cannot eat or  drink. You feel nauseous. You vomit. You have diarrhea. Get help right away if: Your blood glucose is lower than 54 mg/dL (3 mmol/L). You get confused. You have trouble thinking clearly. You have trouble breathing. These symptoms may represent a serious problem that is an emergency. Do not wait to see if the symptoms will go away. Get medical help right away. Call your local emergency services (911 in the [place] Do not drive yourself to the hospital. Summary Diabetes mellitus is a chronic disease that occurs when the body does not properly use sugar (glucose) that is released from food after you eat. Take insulin and diabetes medicines as told. Check your blood glucose every day, as often as told. Keep all follow-up visits. This is important. This information is not intended to replace advice given to you by your health care provider. Make sure you discuss any questions you have with your healthcare provider. Document Revised: 02/15/2020 Document Reviewed: 02/15/2020 Elsevier Patient Education  [place].

## 2021-04-13 LAB — TSH: TSH: 0.563 u[IU]/mL (ref 0.450–4.500)

## 2021-04-13 LAB — CMP14+EGFR
ALT: 13 IU/L (ref 0–32)
AST: 12 IU/L (ref 0–40)
Albumin/Globulin Ratio: 1.4 (ref 1.2–2.2)
Albumin: 4.2 g/dL (ref 3.8–4.9)
Alkaline Phosphatase: 101 IU/L (ref 44–121)
BUN/Creatinine Ratio: 16 (ref 9–23)
BUN: 16 mg/dL (ref 6–24)
Bilirubin Total: 0.3 mg/dL (ref 0.0–1.2)
CO2: 25 mmol/L (ref 20–29)
Calcium: 9.6 mg/dL (ref 8.7–10.2)
Chloride: 105 mmol/L (ref 96–106)
Creatinine, Ser: 1 mg/dL (ref 0.57–1.00)
Globulin, Total: 3.1 g/dL (ref 1.5–4.5)
Glucose: 85 mg/dL (ref 65–99)
Potassium: 4.9 mmol/L (ref 3.5–5.2)
Sodium: 142 mmol/L (ref 134–144)
Total Protein: 7.3 g/dL (ref 6.0–8.5)
eGFR: 67 mL/min/{1.73_m2} (ref >59)

## 2021-04-13 LAB — T3, FREE: T3, Free: 2.3 pg/mL (ref 2.0–4.4)

## 2021-04-13 LAB — T4, FREE: Free T4: 1.82 ng/dL — ABNORMAL HIGH (ref 0.82–1.77)

## 2021-04-13 LAB — HEMOGLOBIN A1C
Est. average glucose Bld gHb Est-mCnc: 111 mg/dL
Hgb A1c MFr Bld: 5.5 % (ref 4.8–5.6)

## 2021-04-17 ENCOUNTER — Encounter: Payer: Self-pay | Admitting: Podiatry

## 2021-04-17 ENCOUNTER — Ambulatory Visit: Payer: BC Managed Care – PPO | Admitting: Podiatry

## 2021-04-17 ENCOUNTER — Other Ambulatory Visit: Payer: Self-pay

## 2021-04-17 DIAGNOSIS — S86219A Strain of muscle(s) and tendon(s) of anterior muscle group at lower leg level, unspecified leg, initial encounter: Secondary | ICD-10-CM

## 2021-04-17 DIAGNOSIS — M722 Plantar fascial fibromatosis: Secondary | ICD-10-CM | POA: Diagnosis not present

## 2021-04-17 MED ORDER — TRIAMCINOLONE ACETONIDE 40 MG/ML IJ SUSP
20.0000 mg | Freq: Once | INTRAMUSCULAR | Status: AC
  Administered 2021-04-17: 20 mg

## 2021-04-17 MED ORDER — BETAMETHASONE SOD PHOS & ACET 6 (3-3) MG/ML IJ SUSP
3.0000 mg | Freq: Once | INTRAMUSCULAR | Status: AC
  Administered 2021-04-17: 3 mg

## 2021-04-17 NOTE — Progress Notes (Signed)
She presents today states that she had a lot of pain in her right foot along the Achilles tendon and tibialis anterior tendon and plantar fascial area on the last day of school.  She states that shortly after that it got a lot better and the pain pretty much went away.  States that there is still some tenderness right here she points to the medial calcaneal tubercle.  Objective: Vital signs are stable alert oriented x3 pulses are palpable.  There is no erythema edema/drainage or odor she has good tibialis anterior tendon function posterior tibial tendon function Achilles tendon function peroneal tendon function and she also has pain on palpation medial calcaneal tubercle of the right heel.  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  Plan: Encouraged her to take her anti-inflammatories I also encouraged her to take an injection to the medial aspect of the right heel which we did today with 10 mg Kenalog 5 mg Marcaine.  Discussed appropriate shoe gear stretching exercise ice therapy and shoe gear modifications.  I will follow-up with her in 1 month or so.  Remember to ask how the flooring open house went.

## 2021-04-18 ENCOUNTER — Encounter: Payer: Self-pay | Admitting: Internal Medicine

## 2021-04-18 ENCOUNTER — Other Ambulatory Visit: Payer: Self-pay

## 2021-04-18 MED ORDER — LEVOTHYROXINE SODIUM 112 MCG PO TABS: 112.0000 ug | ORAL_TABLET | Freq: Every day | ORAL | 1 refills | Status: DC

## 2021-04-18 MED ORDER — ATORVASTATIN CALCIUM 10 MG PO TABS: 10.0000 mg | ORAL_TABLET | Freq: Every day | ORAL | 1 refills | Status: DC

## 2021-04-20 LAB — SPECIMEN STATUS REPORT

## 2021-04-23 ENCOUNTER — Other Ambulatory Visit: Payer: Self-pay

## 2021-04-23 ENCOUNTER — Other Ambulatory Visit: Payer: BC Managed Care – PPO

## 2021-04-23 DIAGNOSIS — Z85850 Personal history of malignant neoplasm of thyroid: Secondary | ICD-10-CM

## 2021-04-24 LAB — THYROGLOBULIN ANTIBODY: Thyroglobulin Antibody: <1 IU/mL (ref 0.0–0.9)

## 2021-05-24 ENCOUNTER — Other Ambulatory Visit: Payer: Self-pay

## 2021-05-24 DIAGNOSIS — I8392 Asymptomatic varicose veins of left lower extremity: Secondary | ICD-10-CM

## 2021-05-29 ENCOUNTER — Ambulatory Visit: Payer: BC Managed Care – PPO | Admitting: Podiatry

## 2021-05-30 ENCOUNTER — Ambulatory Visit (HOSPITAL_COMMUNITY)
Admission: RE | Admit: 2021-05-30 | Discharge: 2021-05-30 | Disposition: A | Discharge status: Home / Self Care | Payer: BC Managed Care – PPO | Source: Ambulatory Visit | Attending: Vascular Surgery | Admitting: Vascular Surgery

## 2021-05-30 ENCOUNTER — Encounter: Payer: Self-pay | Admitting: Internal Medicine

## 2021-05-30 ENCOUNTER — Other Ambulatory Visit: Payer: Self-pay

## 2021-05-30 ENCOUNTER — Ambulatory Visit: Payer: BC Managed Care – PPO | Admitting: Physician Assistant

## 2021-05-30 VITALS — BP 123/79 | HR 65 | Temp 98.0°F | Resp 20 | Ht 65.0 in | Wt 220.7 lb

## 2021-05-30 DIAGNOSIS — I8392 Asymptomatic varicose veins of left lower extremity: Secondary | ICD-10-CM

## 2021-05-31 ENCOUNTER — Encounter: Payer: Self-pay | Admitting: Physician Assistant

## 2021-05-31 NOTE — Progress Notes (Signed)
Office Note     CC:  follow up Requesting Provider:  Glendale Chard, MD  HPI: Dana Vasquez is a 55 y.o. (11-01-1965) female who presents for evaluation of varicose veins of left lower extremity.  She states she has had veins on her lateral lower leg and calf for "years".  She denies any history of DVT, venous ulcerations, trauma, or prior vascular intervention.  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  She does not wear compression or make an effort to elevate her legs during the day.  Other than appearance they are not much bother to her however she is concerned that these could lead to something more detrimental to her health.  She denies tobacco use.   Past Medical History:  Diagnosis Date   Allergy    Anemia    Arthritis    knees  AND SHOULDERS   Asthma    allergy related - rarely uses inhaler   Bruises easily    Bunion    Cancer ([facility])    1995;thyroid cancer, SURGERY AND RADIOACTIVE IODINE   Ectopic pregnancy    Euthyroid    Fatigue    Fibromyalgia    GERD (gastroesophageal reflux disease)    Headache(784.0)    otc med prn - last one 02/2014   History of blood transfusion 2000   In [place], [place] at [place] - ? 4 units transfused   Hypothyroidism    Knee pain    Plantar fasciitis    Sleep apnea    uses CPAP   SVD (spontaneous vaginal delivery)    x 3   Thyroid disease    thyroidectomy 1995    Past Surgical History:  Procedure Laterality Date   ABDOMINAL HYSTERECTOMY N/A 04/20/2014   Procedure: Total ABDOMINAL HYSTERECTOMY partial right salpingectomy;  Surgeon: Eldred Manges, MD;  Location: [facility];  Service: Gynecology;  Laterality: N/A;   BREATH TEK H PYLORI  09/18/2011   Procedure: BREATH TEK H PYLORI;  Surgeon: Pedro Earls, MD;  Location: [facility];  Service: General;  Laterality: N/A;   BUNIONECTOMY     left;2015   BUNIONECTOMY     COLONOSCOPY     DILATION AND CURETTAGE OF UTERUS  1988   endometriosis   ECTOPIC  PREGNANCY SURGERY     fallopian tubes removed     left tube removed per patient - laparotomy   FOOT SURGERY Right 2019   FOOT SURGERY Bilateral 2021   LAPAROSCOPIC GASTRIC SLEEVE RESECTION N/A 04/14/2018   Procedure: LAPAROSCOPIC GASTRIC SLEEVE RESECTION WITH UPPER ENDO AND ERAS PATHWAY;  Surgeon: Johnathan Hausen, MD;  Location: [facility];  Service: General;  Laterality: N/A;   LYSIS OF ADHESION N/A 04/20/2014   Procedure: LYSIS OF ADHESION;  Surgeon: Eldred Manges, MD;  Location: [facility];  Service: Gynecology;  Laterality: N/A;   THYROIDECTOMY  1995   TUBAL LIGATION     WISDOM TOOTH EXTRACTION      Social History   Socioeconomic History   Marital status: Married    Spouse name: Not on file   Number of children: 3   Years of education: Not on file   Highest education level: Not on file  Occupational History   Not on file  Tobacco Use   Smoking status: Never   Smokeless tobacco: Never  Vaping Use   Vaping Use: Never used  Substance and Sexual Activity   Alcohol use: Yes    Alcohol/week: 0.0  standard drinks    Comment: occ   Drug use: No   Sexual activity: Yes    Birth control/protection: Surgical  Other Topics Concern   Not on file  Social History Narrative   Not on file   Social Determinants of Health   Financial Resource Strain: Not on file  Food Insecurity: Not on file  Transportation Needs: Not on file  Physical Activity: Not on file  Stress: Not on file  Social Connections: Not on file  Intimate Partner Violence: Not on file    Family History  Problem Relation Age of Onset   Hypertension Mother    Cancer Mother        uterine   Cancer Father    Cancer Brother        stomach; 16   Early death Brother    Stroke Maternal Uncle    Heart disease Maternal Uncle    Food Allergy Daughter    Allergic rhinitis Daughter    Allergic rhinitis Daughter    Angioedema Neg Hx    Asthma Neg Hx    Eczema Neg Hx    Immunodeficiency Neg Hx    Urticaria Neg Hx     Atopy Neg Hx     Current Outpatient Medications  Medication Sig Dispense Refill   albuterol (VENTOLIN HFA) 108 (90 Base) MCG/ACT inhaler INHALE 2 PUFFS INTO THE LUNGS EVERY 6 HOURS AS NEEDED FOR WHEEZING OR SHORTNESS OF BREATH 54 g 3   atorvastatin (LIPITOR) 10 MG tablet Take 1 tablet (10 mg total) by mouth daily. 90 tablet 1   EPINEPHrine (AUVI-Q) 0.3 mg/0.3 mL IJ SOAJ injection Inject 0.3 mg into the muscle as needed for anaphylaxis. As directed for life-threatening allergic reactions 2 each 2   levothyroxine (SYNTHROID) 112 MCG tablet Take 1 tablet (112 mcg total) by mouth daily. 90 tablet 1   cetirizine (ZYRTEC) 10 MG tablet Take 1 tablet (10 mg total) by mouth daily. (Patient not taking: Reported on 05/30/2021) 90 tablet 2   No current facility-administered medications for this visit.    Allergies  Allergen Reactions   Amoxicillin Other (See Comments)    Headache Has patient had a PCN reaction causing immediate rash, facial/tongue/throat swelling, SOB or lightheadedness with hypotension: No Has patient had a PCN reaction causing severe rash involving mucus membranes or skin necrosis: No Has patient had a PCN reaction that required hospitalization: No Has patient had a PCN reaction occurring within the last 10 years: Unknown If all of the above answers are "NO", then may proceed with Cephalosporin use.    Nitrofurantoin Monohyd Macro Other (See Comments)    Pt stated she gets a bad yeast infection   Dexilant [Dexlansoprazole]     "bad stomachache"   Meloxicam     Upset stomach and stomach pains   Budesonide Rash   Formoterol Rash   Formoterol Fumarate Rash     REVIEW OF SYSTEMS:   '[X]'$  denotes positive finding, '[ ]'$  denotes negative finding Cardiac  Comments:  Chest pain or chest pressure:    Shortness of breath upon exertion:    Short of breath when lying flat:    Irregular heart rhythm:        Vascular    Pain in calf, thigh, or hip brought on by ambulation:    Pain  in feet at night that wakes you up from your sleep:     Blood clot in your veins:    Leg swelling:  Pulmonary    Oxygen at home:    Productive cough:     Wheezing:         Neurologic    Sudden weakness in arms or legs:     Sudden numbness in arms or legs:     Sudden onset of difficulty speaking or slurred speech:    Temporary loss of vision in one eye:     Problems with dizziness:         Gastrointestinal    Blood in stool:     Vomited blood:         Genitourinary    Burning when urinating:     Blood in urine:        Psychiatric    Major depression:         Hematologic    Bleeding problems:    Problems with blood clotting too easily:        Skin    Rashes or ulcers:        Constitutional    Fever or chills:      PHYSICAL EXAMINATION:  Vitals:   05/30/21 1316  BP: 123/79  Pulse: 65  Resp: 20  Temp: 98 F (36.7 C)  TempSrc: Temporal  SpO2: 100%  Weight: 220 lb 11.2 oz (100.1 kg)  Height: '5\' 5"'$  (1.651 m)    General:  WDWN in NAD; vital signs documented above Gait: Not observed HENT: WNL, normocephalic Pulmonary: normal non-labored breathing Cardiac: regular HR Abdomen: soft, NT, no masses Skin: without rashes Vascular Exam/Pulses:  Right Left  Radial 2+ (normal) 2+ (normal)  DP 2+ (normal) 2+ (normal)   Extremities: without ischemic changes, without Gangrene , without cellulitis; without open wounds; 2 areas of varicosities of left lateral/posterior lower leg Musculoskeletal: no muscle wasting or atrophy  Neurologic: A&O X 3;  No focal weakness or paresthesias are detected Psychiatric:  The pt has Normal affect.   Non-Invasive Vascular Imaging:   Negative for DVT of left lower extremity Negative for superficial or deep venous reflux of left lower extremity    ASSESSMENT/PLAN:: 55 y.o. female here for evaluation of left lower leg varicose veins  -Venous reflux study of left lower extremity was negative for DVT as well as for  superficial and deep venous reflux -Reassured the patient that the simple presence of these varicose veins does not predispose her to anything that would impact her overall health -Recommendations included knee-high 15 to 20 mm compression stockings to be worn daily, periodic elevation of her legs when possible during the day, and avoiding prolonged sitting and standing -Patient can follow-up on an as-needed basis   Dagoberto Ligas, PA-C [facility] [phone]  Clinic MD:   Scot Dock

## 2021-06-21 ENCOUNTER — Ambulatory Visit: Payer: BC Managed Care – PPO | Admitting: Allergy

## 2021-06-21 DIAGNOSIS — J309 Allergic rhinitis, unspecified: Secondary | ICD-10-CM

## 2021-07-10 ENCOUNTER — Encounter: Payer: Self-pay | Admitting: Nurse Practitioner

## 2021-07-10 ENCOUNTER — Ambulatory Visit (INDEPENDENT_AMBULATORY_CARE_PROVIDER_SITE_OTHER): Payer: BC Managed Care – PPO | Admitting: Nurse Practitioner

## 2021-07-10 ENCOUNTER — Other Ambulatory Visit: Payer: Self-pay

## 2021-07-10 ENCOUNTER — Encounter: Payer: Self-pay | Admitting: Internal Medicine

## 2021-07-10 VITALS — BP 120/78 | HR 110 | Ht 65.0 in

## 2021-07-10 DIAGNOSIS — R0981 Nasal congestion: Secondary | ICD-10-CM | POA: Diagnosis not present

## 2021-07-10 DIAGNOSIS — R059 Cough, unspecified: Secondary | ICD-10-CM | POA: Diagnosis not present

## 2021-07-10 DIAGNOSIS — U071 COVID-19: Secondary | ICD-10-CM

## 2021-07-10 LAB — POCT RAPID STREP A (OFFICE): Rapid Strep A Screen: NEGATIVE

## 2021-07-10 MED ORDER — AZITHROMYCIN 250 MG PO TABS: ORAL_TABLET | ORAL | 0 refills | Status: AC

## 2021-07-10 MED ORDER — BENZONATATE 100 MG PO CAPS: 100.0000 mg | ORAL_CAPSULE | Freq: Three times a day (TID) | ORAL | 1 refills | Status: DC | PRN

## 2021-07-10 MED ORDER — GUAIFENESIN-DM 100-10 MG/5ML PO SYRP: 5.0000 mL | ORAL_SOLUTION | ORAL | 0 refills | Status: DC | PRN

## 2021-07-10 NOTE — Progress Notes (Signed)
I,Katawbba Wiggins,acting as a Education administrator for [employer], NP.,have documented all relevant documentation on the behalf of [employer], NP,as directed by  Bary Castilla, NP while in the presence of Bary Castilla, NP.  This visit occurred during the SARS-CoV-2 public health emergency.  Safety protocols were in place, including screening questions prior to the visit, additional usage of staff PPE, and extensive cleaning of exam room while observing appropriate contact time as indicated for disinfecting solutions.  Subjective:     Patient ID: Dana Vasquez , female    DOB: 1966-07-24 , 55 y.o.   MRN: [id number]   Chief Complaint  Patient presents with   Sinusitis    HPI  Patient is here for a outside visit for symptoms of sinus, cough, congestion, headache, sore throat, No fever, denies shortness of breath or chest pain. Her symptoms started Sat. She is vaccinated and has both of her vaccines. She is also boosted. She is a Pharmacist, hospital.     Past Medical History:  Diagnosis Date   Allergy    Anemia    Arthritis    knees  AND SHOULDERS   Asthma    allergy related - rarely uses inhaler   Bruises easily    Bunion    Cancer ([facility] [facility])    1995;thyroid cancer, SURGERY AND RADIOACTIVE IODINE   Ectopic pregnancy    Euthyroid    Fatigue    Fibromyalgia    GERD (gastroesophageal reflux disease)    Headache(784.0)    otc med prn - last one 02/2014   History of blood transfusion 2000   In [place], [place] at [place] - ? 4 units transfused   Hypothyroidism    Knee pain    Plantar fasciitis    Sleep apnea    uses CPAP   SVD (spontaneous vaginal delivery)    x 3   Thyroid disease    thyroidectomy 1995     Family History  Problem Relation Age of Onset   Hypertension Mother    Cancer Mother        uterine   Cancer Father    Cancer Brother        stomach; 35   Early death Brother    Stroke Maternal Uncle    Heart disease Maternal Uncle    Food Allergy Daughter    Allergic  rhinitis Daughter    Allergic rhinitis Daughter    Angioedema Neg Hx    Asthma Neg Hx    Eczema Neg Hx    Immunodeficiency Neg Hx    Urticaria Neg Hx    Atopy Neg Hx      Current Outpatient Medications:    azithromycin (ZITHROMAX) 250 MG tablet, Take 2 tablets (500 mg) on  Day 1,  followed by 1 tablet (250 mg) once daily on Days 2 through 5., Disp: 6 each, Rfl: 0   benzonatate (TESSALON PERLES) 100 MG capsule, Take 1 capsule (100 mg total) by mouth 3 (three) times daily as needed for cough., Disp: 30 capsule, Rfl: 1   guaiFENesin-dextromethorphan (ROBITUSSIN DM) 100-10 MG/5ML syrup, Take 5 mLs by mouth every 4 (four) hours as needed for cough., Disp: 118 mL, Rfl: 0   albuterol (VENTOLIN HFA) 108 (90 Base) MCG/ACT inhaler, INHALE 2 PUFFS INTO THE LUNGS EVERY 6 HOURS AS NEEDED FOR WHEEZING OR SHORTNESS OF BREATH, Disp: 54 g, Rfl: 3   atorvastatin (LIPITOR) 10 MG tablet, Take 1 tablet (10 mg total) by mouth daily., Disp: 90 tablet, Rfl: 1  cetirizine (ZYRTEC) 10 MG tablet, Take 1 tablet (10 mg total) by mouth daily. (Patient not taking: Reported on 05/30/2021), Disp: 90 tablet, Rfl: 2   EPINEPHrine (AUVI-Q) 0.3 mg/0.3 mL IJ SOAJ injection, Inject 0.3 mg into the muscle as needed for anaphylaxis. As directed for life-threatening allergic reactions, Disp: 2 each, Rfl: 2   levothyroxine (SYNTHROID) 112 MCG tablet, Take 1 tablet (112 mcg total) by mouth daily., Disp: 90 tablet, Rfl: 1   Allergies  Allergen Reactions   Amoxicillin Other (See Comments)    Headache Has patient had a PCN reaction causing immediate rash, facial/tongue/throat swelling, SOB or lightheadedness with hypotension: No Has patient had a PCN reaction causing severe rash involving mucus membranes or skin necrosis: No Has patient had a PCN reaction that required hospitalization: No Has patient had a PCN reaction occurring within the last 10 years: Unknown If all of the above answers are "NO", then may proceed with Cephalosporin  use.    Nitrofurantoin Monohyd Macro Other (See Comments)    Pt stated she gets a bad yeast infection   Dexilant [Dexlansoprazole]     "bad stomachache"   Meloxicam     Upset stomach and stomach pains   Budesonide Rash   Formoterol Rash   Formoterol Fumarate Rash     Review of Systems  Constitutional:  Negative for chills, fatigue and fever.  HENT:  Positive for congestion, postnasal drip, sinus pressure, sneezing and sore throat.   Respiratory:  Negative for shortness of breath and wheezing.   Cardiovascular:  Negative for chest pain and palpitations.  Gastrointestinal:  Negative for constipation and diarrhea.  Endocrine: Negative for polydipsia, polyphagia and polyuria.  Musculoskeletal:  Negative for arthralgias and myalgias.  Neurological:  Positive for weakness. Negative for dizziness and headaches.    Today's Vitals   07/10/21 1605  BP: 120/78  Pulse: (!) 110  Height: '5\' 5"'$  (1.651 m)   Body mass index is 36.73 kg/m.   Objective:  Physical Exam Constitutional:      Appearance: Normal appearance. She is normal weight.  HENT:     Head: Normocephalic and atraumatic.     Nose: Congestion and rhinorrhea present.  Cardiovascular:     Rate and Rhythm: Normal rate and regular rhythm.     Pulses: Normal pulses.     Heart sounds: Normal heart sounds. No murmur heard. Pulmonary:     Effort: Pulmonary effort is normal. No respiratory distress.     Breath sounds: Normal breath sounds. No wheezing.  Skin:    General: Skin is warm and dry.     Capillary Refill: Capillary refill takes less than 2 seconds.  Neurological:     Mental Status: She is alert.        Assessment And Plan:     1. Sinus congestion - azithromycin (ZITHROMAX) 250 MG tablet; Take 2 tablets (500 mg) on  Day 1,  followed by 1 tablet (250 mg) once daily on Days 2 through 5.  Dispense: 6 each; Refill: 0 - POCT rapid strep A  2. COVID-19 virus infection - Novel Coronavirus, NAA ([facility])  3. Cough -  guaiFENesin-dextromethorphan (ROBITUSSIN DM) 100-10 MG/5ML syrup; Take 5 mLs by mouth every 4 (four) hours as needed for cough.  Dispense: 118 mL; Refill: 0 - benzonatate (TESSALON PERLES) 100 MG capsule; Take 1 capsule (100 mg total) by mouth 3 (three) times daily as needed for cough.  Dispense: 30 capsule; Refill: 1 - Novel Coronavirus, NAA ([facility])    The  patient was encouraged to call or send a message through [place] for any questions or concerns.   Follow up: if symptoms persist or do not get better.   Advised patient to take Vitamin C, D, Zinc.  Keep yourself hydrated with a lot of water and rest. Take Delsym for cough and Mucinex as need. Take Tylenol or pain reliever every 4-6 hours as needed for pain/fever/body ache. If you have elevated blood pressure, you can take OTC Corcidin. You can also take OTC oscillococcinum to help with your symptoms.  Educated patient if symptoms get worse or if she experiences any SOB, chest pain or pain in her legs to seek immediate emergency care. Continue to monitor your oxygen levels. Call us if you have any questions. Quarantine for 5 days if tested positive and no symptoms or 10 days if tested positive and have symptoms. Wear a mask around other people.   Patient was given opportunity to ask questions. Patient verbalized understanding of the plan and was able to repeat key elements of the plan. All questions were answered to their satisfaction.  Raman Daryan Buell, DNP   I, Raman Lorea Kupfer have reviewed all documentation for this visit. The documentation on 07/10/21 for the exam, diagnosis, procedures, and orders are all accurate and complete.    IF YOU HAVE BEEN REFERRED TO A SPECIALIST, IT MAY TAKE 1-2 WEEKS TO SCHEDULE/PROCESS THE REFERRAL. IF YOU HAVE NOT HEARD FROM US/SPECIALIST IN TWO WEEKS, PLEASE GIVE [place] A CALL AT [phone].   THE PATIENT IS ENCOURAGED TO PRACTICE SOCIAL DISTANCING DUE TO THE COVID-19 PANDEMIC.

## 2021-07-14 LAB — NOVEL CORONAVIRUS, NAA: SARS-CoV-2, NAA: NOT DETECTED

## 2021-07-14 LAB — SARS-COV-2, NAA 2 DAY TAT

## 2021-07-17 ENCOUNTER — Encounter: Payer: Self-pay | Admitting: Internal Medicine

## 2021-09-15 IMAGING — DX DG CHEST 2V
2 series · 2 of 2 positions shown · non-contrast
Comparison: 11/17/2017

CLINICAL DATA: Dyspnea

EXAM:
CHEST - 2 VIEW

[chest pa]
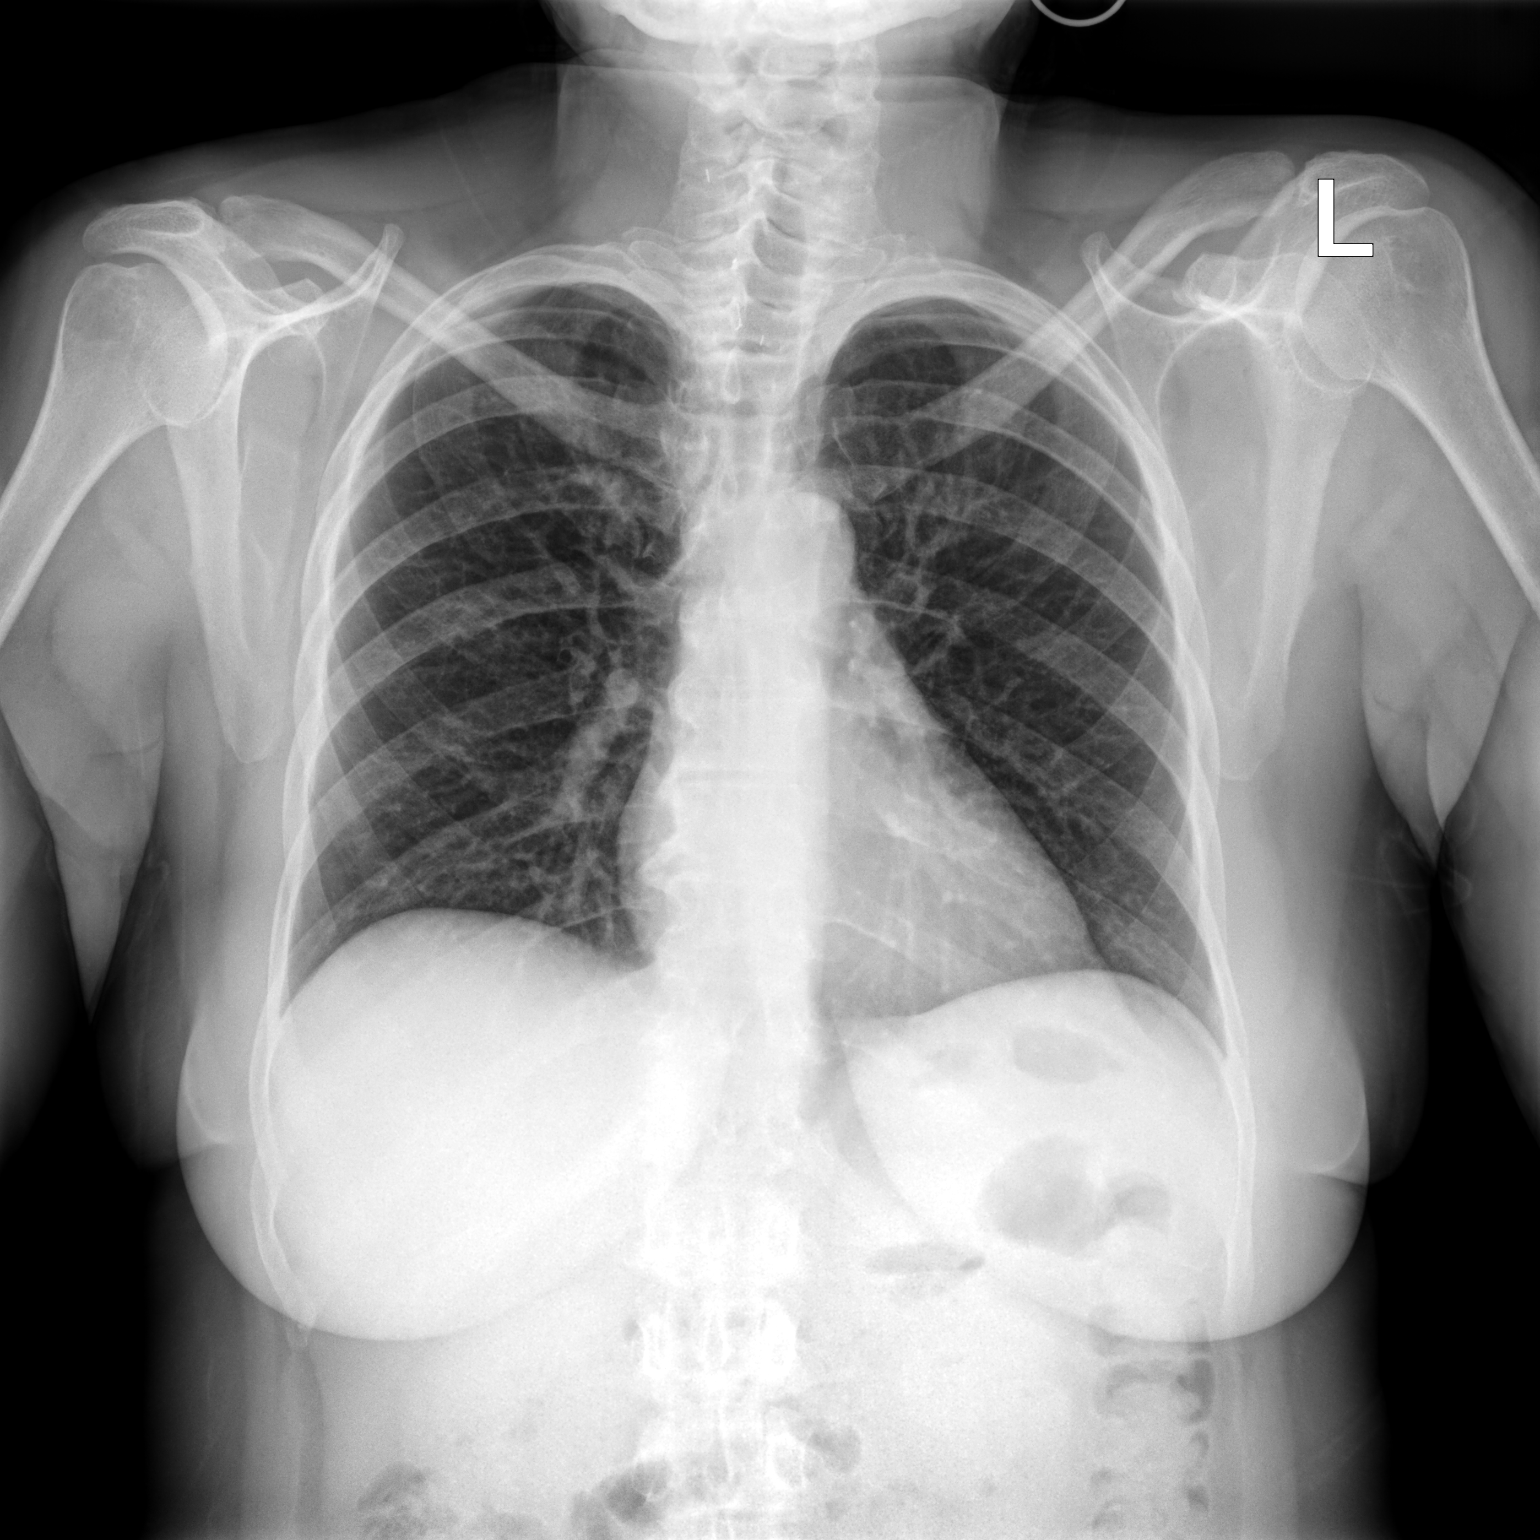

[chest lat]
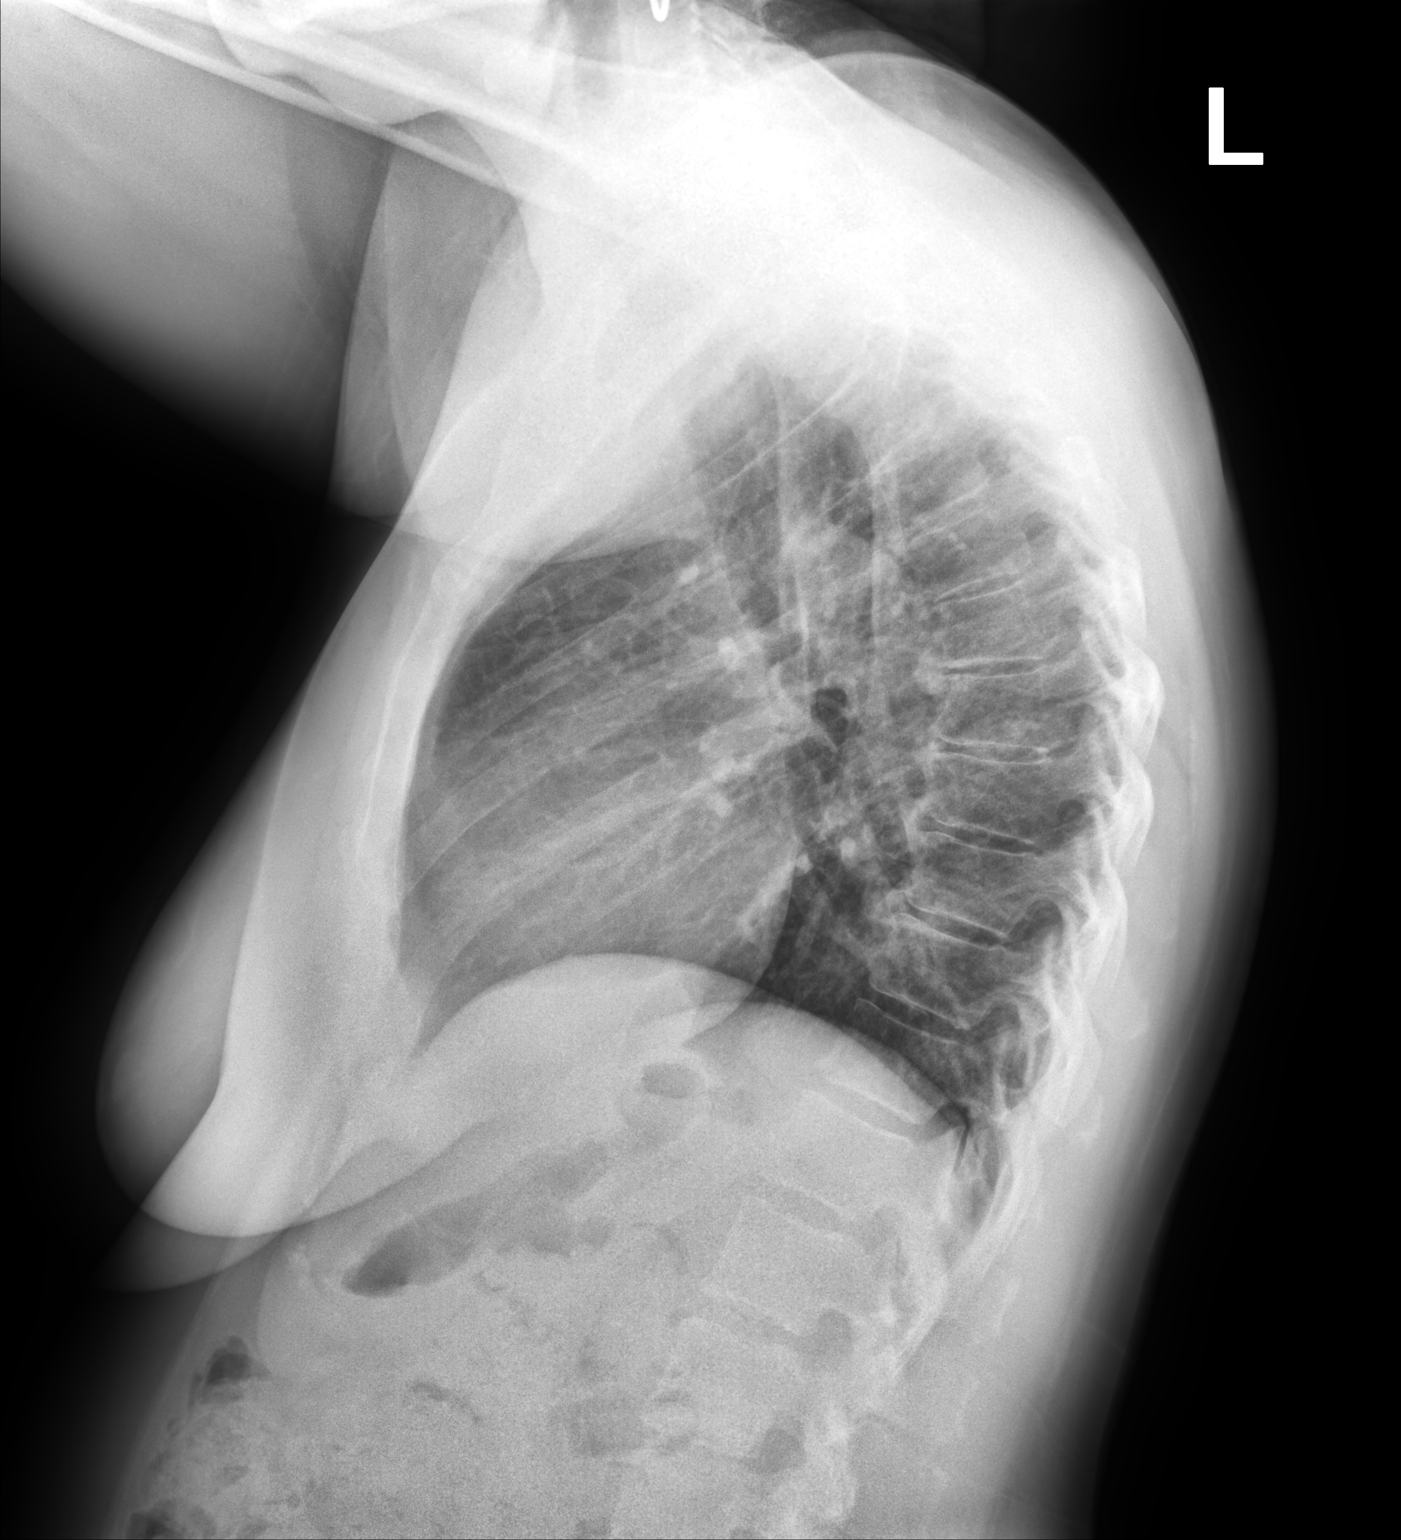

[2 of 2 positions shown; findings below may reference images not displayed]

FINDINGS: Cardiac shadow is within normal limits. The lungs are well aerated
bilaterally. No focal infiltrate or sizable effusion is seen.
Degenerative changes of the thoracic spine are noted.
IMPRESSION: No active cardiopulmonary disease.

## 2021-10-10 ENCOUNTER — Encounter: Payer: Self-pay | Admitting: Internal Medicine

## 2021-10-11 ENCOUNTER — Telehealth: Payer: Self-pay

## 2021-10-11 ENCOUNTER — Ambulatory Visit: Payer: BC Managed Care – PPO | Admitting: Internal Medicine

## 2021-10-11 NOTE — Telephone Encounter (Signed)
The pt was offered an appt for today for evaluation of the flu.  The pt said that she was seen at urgent care yesterday.

## 2021-10-16 ENCOUNTER — Encounter: Payer: BC Managed Care – PPO | Admitting: Internal Medicine

## 2021-11-08 ENCOUNTER — Encounter: Payer: BC Managed Care – PPO | Admitting: Internal Medicine

## 2021-11-15 ENCOUNTER — Other Ambulatory Visit: Payer: Self-pay | Admitting: Internal Medicine

## 2021-11-22 ENCOUNTER — Other Ambulatory Visit: Payer: Self-pay | Admitting: Internal Medicine

## 2021-11-23 ENCOUNTER — Telehealth: Payer: Self-pay

## 2021-11-28 NOTE — Telephone Encounter (Signed)
[facility] error.

## 2021-12-04 ENCOUNTER — Encounter: Payer: Self-pay | Admitting: Nurse Practitioner

## 2021-12-04 ENCOUNTER — Other Ambulatory Visit: Payer: Self-pay

## 2021-12-04 ENCOUNTER — Ambulatory Visit: Payer: BC Managed Care – PPO | Admitting: Nurse Practitioner

## 2021-12-04 VITALS — BP 130/72 | HR 66 | Temp 98.1°F | Ht 65.0 in | Wt 222.8 lb

## 2021-12-04 DIAGNOSIS — I10 Essential (primary) hypertension: Secondary | ICD-10-CM | POA: Diagnosis not present

## 2021-12-04 DIAGNOSIS — R7303 Prediabetes: Secondary | ICD-10-CM

## 2021-12-04 DIAGNOSIS — E7800 Pure hypercholesterolemia, unspecified: Secondary | ICD-10-CM

## 2021-12-04 DIAGNOSIS — R040 Epistaxis: Secondary | ICD-10-CM

## 2021-12-04 DIAGNOSIS — Z6837 Body mass index (BMI) 37.0-37.9, adult: Secondary | ICD-10-CM

## 2021-12-04 DIAGNOSIS — T148XXA Other injury of unspecified body region, initial encounter: Secondary | ICD-10-CM

## 2021-12-04 DIAGNOSIS — E890 Postprocedural hypothyroidism: Secondary | ICD-10-CM

## 2021-12-04 DIAGNOSIS — E66812 Obesity, class 2: Secondary | ICD-10-CM

## 2021-12-04 MED ORDER — ATORVASTATIN CALCIUM 10 MG PO TABS: ORAL_TABLET | ORAL | 1 refills | Status: DC

## 2021-12-04 NOTE — Patient Instructions (Signed)
Cholesterol Content in Foods ?Cholesterol is a waxy, fat-like substance that helps to carry fat in the blood. The body needs cholesterol in small amounts, but too much cholesterol can cause damage to the arteries and heart. ?What foods have cholesterol? ?Cholesterol is found in animal-based foods, such as meat, seafood, and dairy. Generally, low-fat dairy and lean meats have less cholesterol than full-fat dairy and fatty meats. The milligrams of cholesterol per serving (mg per serving) of common cholesterol-containing foods are listed below. ?Meats and other proteins ?Egg -- one large whole egg has 186 mg. ?Veal shank -- 4 oz (113 g) has 141 mg. ?Lean ground turkey (93% lean) -- 4 oz (113 g) has 118 mg. ?Fat-trimmed lamb loin -- 4 oz (113 g) has 106 mg. ?Lean ground beef (90% lean) -- 4 oz (113 g) has 100 mg. ?Lobster -- 3.5 oz (99 g) has 90 mg. ?Pork loin chops -- 4 oz (113 g) has 86 mg. ?Canned salmon -- 3.5 oz (99 g) has 83 mg. ?Fat-trimmed beef top loin -- 4 oz (113 g) has 78 mg. ?Frankfurter -- 1 frank (3.5 oz or 99 g) has 77 mg. ?Crab -- 3.5 oz (99 g) has 71 mg. ?Roasted chicken without skin, white meat -- 4 oz (113 g) has 66 mg. ?Light bologna -- 2 oz (57 g) has 45 mg. ?Deli-cut turkey -- 2 oz (57 g) has 31 mg. ?Canned tuna -- 3.5 oz (99 g) has 31 mg. ?Bacon -- 1 oz (28 g) has 29 mg. ?Oysters and mussels (raw) -- 3.5 oz (99 g) has 25 mg. ?Mackerel -- 1 oz (28 g) has 22 mg. ?Trout -- 1 oz (28 g) has 20 mg. ?Pork sausage -- 1 link (1 oz or 28 g) has 17 mg. ?Salmon -- 1 oz (28 g) has 16 mg. ?Tilapia -- 1 oz (28 g) has 14 mg. ?Dairy ?Soft-serve ice cream -- ? cup (4 oz or 86 g) has 103 mg. ?Whole-milk yogurt -- 1 cup (8 oz or 245 g) has 29 mg. ?Cheddar cheese -- 1 oz (28 g) has 28 mg. ?American cheese -- 1 oz (28 g) has 28 mg. ?Whole milk -- 1 cup (8 oz or 250 mL) has 23 mg. ?2% milk -- 1 cup (8 oz or 250 mL) has 18 mg. ?Cream cheese -- 1 tablespoon (Tbsp) (14.5 g) has 15 mg. ?Cottage cheese -- ? cup (4 oz or 113  g) has 14 mg. ?Low-fat (1%) milk -- 1 cup (8 oz or 250 mL) has 10 mg. ?Sour cream -- 1 Tbsp (12 g) has 8.5 mg. ?Low-fat yogurt -- 1 cup (8 oz or 245 g) has 8 mg. ?Nonfat Greek yogurt -- 1 cup (8 oz or 228 g) has 7 mg. ?Half-and-half cream -- 1 Tbsp (15 mL) has 5 mg. ?Fats and oils ?Cod liver oil -- 1 tablespoon (Tbsp) (13.6 g) has 82 mg. ?Butter -- 1 Tbsp (14 g) has 15 mg. ?Lard -- 1 Tbsp (12.8 g) has 14 mg. ?Bacon grease -- 1 Tbsp (12.9 g) has 14 mg. ?Mayonnaise -- 1 Tbsp (13.8 g) has 5-10 mg. ?Margarine -- 1 Tbsp (14 g) has 3-10 mg. ?The items listed above may not be a complete list of foods with cholesterol. Exact amounts of cholesterol in these foods may vary depending on specific ingredients and brands. Contact a dietitian for more information. ?What foods do not have cholesterol? ?Most plant-based foods do not have cholesterol unless you combine them with a food that has cholesterol.   Foods without cholesterol include: ?Grains and cereals. ?Vegetables. ?Fruits. ?Vegetable oils, such as olive, canola, and sunflower oil. ?Legumes, such as peas, beans, and lentils. ?Nuts and seeds. ?Egg whites. ?The items listed above may not be a complete list of foods that do not have cholesterol. Contact a dietitian for more information. ?Summary ?The body needs cholesterol in small amounts, but too much cholesterol can cause damage to the arteries and heart. ?Cholesterol is found in animal-based foods, such as meat, seafood, and dairy. Generally, low-fat dairy and lean meats have less cholesterol than full-fat dairy and fatty meats. ?This information is not intended to replace advice given to you by your health care provider. Make sure you discuss any questions you have with your health care provider. ?Document Revised: 02/23/2021 Document Reviewed: 02/23/2021 ?Elsevier Patient Education ? 2022 [employer] ? ?

## 2021-12-04 NOTE — Progress Notes (Signed)
I,[name],acting as a Education administrator for Minette Brine, FNP.,have documented all relevant documentation on the behalf of Minette Brine, FNP,as directed by  Minette Brine, FNP while in the presence of Minette Brine, [place].   This visit occurred during the SARS-CoV-2 public health emergency.  Safety protocols were in place, including screening questions prior to the visit, additional usage of staff PPE, and extensive cleaning of exam room while observing appropriate contact time as indicated for disinfecting solutions.  Subjective:     Patient ID: Dana Vasquez , female    DOB: 03-12-1966 , 56 y.o.   MRN: [id number]   Chief Complaint  Patient presents with   Hyperlipidemia    HPI  Pt presents today for chol f/u. She is taking statin daily every evening and has been having leg cramps.   Hyperlipidemia This is a chronic problem. The current episode started more than 1 year ago. The problem is controlled. She has no history of chronic renal disease. Pertinent negatives include no chest pain. Risk factors for coronary artery disease include dyslipidemia, obesity and a sedentary lifestyle.    Past Medical History:  Diagnosis Date   Allergy    Anemia    Arthritis    knees  AND SHOULDERS   Asthma    allergy related - rarely uses inhaler   Bruises easily    Bunion    Cancer ([place])    1995;thyroid cancer, SURGERY AND RADIOACTIVE IODINE   Ectopic pregnancy    Euthyroid    Fatigue    Fibromyalgia    GERD (gastroesophageal reflux disease)    Headache(784.0)    otc med prn - last one 02/2014   History of blood transfusion 2000   In [place], [place] at [facility] - ? 4 units transfused   Hypothyroidism    Knee pain    Plantar fasciitis    Sleep apnea    uses CPAP   SVD (spontaneous vaginal delivery)    x 3   Thyroid disease    thyroidectomy 1995     Family History  Problem Relation Age of Onset   Hypertension Mother    Cancer Mother        uterine   Cancer Father    Cancer Brother         stomach; 37   Early death Brother    Stroke Maternal Uncle    Heart disease Maternal Uncle    Food Allergy Daughter    Allergic rhinitis Daughter    Allergic rhinitis Daughter    Angioedema Neg Hx    Asthma Neg Hx    Eczema Neg Hx    Immunodeficiency Neg Hx    Urticaria Neg Hx    Atopy Neg Hx      Current Outpatient Medications:    albuterol (VENTOLIN HFA) 108 (90 Base) MCG/ACT inhaler, INHALE 2 PUFFS INTO THE LUNGS EVERY 6 HOURS AS NEEDED FOR WHEEZING OR SHORTNESS OF BREATH, Disp: 54 g, Rfl: 3   benzonatate (TESSALON PERLES) 100 MG capsule, Take 1 capsule (100 mg total) by mouth 3 (three) times daily as needed for cough., Disp: 30 capsule, Rfl: 1   cetirizine (ZYRTEC) 10 MG tablet, Take 1 tablet (10 mg total) by mouth daily., Disp: 90 tablet, Rfl: 2   EPINEPHrine (AUVI-Q) 0.3 mg/0.3 mL IJ SOAJ injection, Inject 0.3 mg into the muscle as needed for anaphylaxis. As directed for life-threatening allergic reactions, Disp: 2 each, Rfl: 2   guaiFENesin-dextromethorphan (ROBITUSSIN DM) 100-10 MG/5ML syrup, Take 5  mLs by mouth every 4 (four) hours as needed for cough., Disp: 118 mL, Rfl: 0   levothyroxine (SYNTHROID) 112 MCG tablet, TAKE 1 TABLET(112 MCG) BY MOUTH DAILY, Disp: 90 tablet, Rfl: 1   atorvastatin (LIPITOR) 10 MG tablet, Take 1 tablet by mouth MWF, Disp: 45 tablet, Rfl: 1   Allergies  Allergen Reactions   Amoxicillin Other (See Comments)    Headache Has patient had a PCN reaction causing immediate rash, facial/tongue/throat swelling, SOB or lightheadedness with hypotension: No Has patient had a PCN reaction causing severe rash involving mucus membranes or skin necrosis: No Has patient had a PCN reaction that required hospitalization: No Has patient had a PCN reaction occurring within the last 10 years: Unknown If all of the above answers are "NO", then may proceed with Cephalosporin use.    Nitrofurantoin Monohyd Macro Other (See Comments)    Pt stated she gets a bad  yeast infection   Dexilant [Dexlansoprazole]     "bad stomachache"   Meloxicam     Upset stomach and stomach pains   Budesonide Rash   Formoterol Rash   Formoterol Fumarate Rash     Review of Systems  Constitutional: Negative.   HENT:  Positive for nosebleeds.   Respiratory: Negative.    Cardiovascular: Negative.  Negative for chest pain, palpitations and leg swelling.  Neurological: Negative.   Psychiatric/Behavioral: Negative.      Today's Vitals   12/04/21 1617  BP: 130/72  Pulse: 66  Temp: 98.1 F (36.7 C)  TempSrc: Oral  Weight: 222 lb 12.8 oz (101.1 kg)  Height: '5\' 5"'  (1.651 m)   Body mass index is 37.08 kg/m.  Wt Readings from Last 3 Encounters:  12/04/21 222 lb 12.8 oz (101.1 kg)  05/30/21 220 lb 11.2 oz (100.1 kg)  04/12/21 217 lb 12.8 oz (98.8 kg)    Objective:  Physical Exam Vitals reviewed.  Constitutional:      General: She is not in acute distress.    Appearance: Normal appearance.  Cardiovascular:     Rate and Rhythm: Normal rate and regular rhythm.     Pulses: Normal pulses.     Heart sounds: Normal heart sounds. No murmur heard. Pulmonary:     Effort: Pulmonary effort is normal. No respiratory distress.     Breath sounds: Normal breath sounds.  Skin:    General: Skin is warm and dry.     Findings: Bruising (medial left lower leg) present.  Neurological:     General: No focal deficit present.     Mental Status: She is alert and oriented to person, place, and time.     Cranial Nerves: No cranial nerve deficit.     Motor: No weakness.  Psychiatric:        Mood and Affect: Mood normal.        Behavior: Behavior normal.        Thought Content: Thought content normal.        Judgment: Judgment normal.        Assessment And Plan:     1. Prediabetes Comments: Within normal limits, diet controlled.  - BMP8+EGFR - Hemoglobin A1c  2. Pure hypercholesterolemia Comments: Normal at last check, she is to take MWF due to muscle cramps and has  well controlled levels. - BMP8+EGFR  3. Well-controlled hypertension Comments: No current medications avoid salt intake. Has been having more elevated readings encouraged to limit salt intake.   4. Postoperative hypothyroidism - TSH - T4 - T3, free  5. Class 2 severe obesity due to excess calories with serious comorbidity and body mass index (BMI) of 37.0 to 37.9 in adult [facility]) She is encouraged to strive for BMI less than 30 to decrease cardiac risk. Advised to aim for at least 150 minutes of exercise per week.  6. Bruising Comments: she has dark bruising to left medial ankle, will check platelet count - CBC  7. Epistaxis Improving after using [place] over the counter    Patient was given opportunity to ask questions. Patient verbalized understanding of the plan and was able to repeat key elements of the plan. All questions were answered to their satisfaction.  Minette Brine, FNP   I, Minette Brine, FNP, have reviewed all documentation for this visit. The documentation on 12/04/21 for the exam, diagnosis, procedures, and orders are all accurate and complete.   IF YOU HAVE BEEN REFERRED TO A SPECIALIST, IT MAY TAKE 1-2 WEEKS TO SCHEDULE/PROCESS THE REFERRAL. IF YOU HAVE NOT HEARD FROM US/SPECIALIST IN TWO WEEKS, PLEASE GIVE [place] A CALL AT [phone].   THE PATIENT IS ENCOURAGED TO PRACTICE SOCIAL DISTANCING DUE TO THE COVID-19 PANDEMIC.

## 2021-12-05 LAB — HEMOGLOBIN A1C
Est. average glucose Bld gHb Est-mCnc: 120 mg/dL
Hgb A1c MFr Bld: 5.8 % — ABNORMAL HIGH (ref 4.8–5.6)

## 2021-12-05 LAB — CBC
Hematocrit: 38 % (ref 34.0–46.6)
Hemoglobin: 12.2 g/dL (ref 11.1–15.9)
MCH: 25.8 pg — ABNORMAL LOW (ref 26.6–33.0)
MCHC: 32.1 g/dL (ref 31.5–35.7)
MCV: 80 fL (ref 79–97)
Platelets: 359 10*3/uL (ref 150–450)
RBC: 4.73 x10E6/uL (ref 3.77–5.28)
RDW: 14 % (ref 11.7–15.4)
WBC: 9.6 10*3/uL (ref 3.4–10.8)

## 2021-12-05 LAB — BMP8+EGFR
BUN/Creatinine Ratio: 17 (ref 9–23)
BUN: 19 mg/dL (ref 6–24)
CO2: 25 mmol/L (ref 20–29)
Calcium: 9.3 mg/dL (ref 8.7–10.2)
Chloride: 105 mmol/L (ref 96–106)
Creatinine, Ser: 1.13 mg/dL — ABNORMAL HIGH (ref 0.57–1.00)
Glucose: 88 mg/dL (ref 70–99)
Potassium: 4.7 mmol/L (ref 3.5–5.2)
Sodium: 143 mmol/L (ref 134–144)
eGFR: 58 mL/min/{1.73_m2} — ABNORMAL LOW (ref >59)

## 2021-12-05 LAB — T4: T4, Total: 9.4 ug/dL (ref 4.5–12.0)

## 2021-12-05 LAB — T3, FREE: T3, Free: 2.6 pg/mL (ref 2.0–4.4)

## 2021-12-05 LAB — TSH: TSH: 0.661 u[IU]/mL (ref 0.450–4.500)

## 2021-12-06 ENCOUNTER — Encounter: Payer: Self-pay | Admitting: Internal Medicine

## 2021-12-11 ENCOUNTER — Encounter: Payer: Self-pay | Admitting: Internal Medicine

## 2021-12-14 DIAGNOSIS — N202 Calculus of kidney with calculus of ureter: Secondary | ICD-10-CM | POA: Insufficient documentation

## 2022-03-21 ENCOUNTER — Ambulatory Visit: Payer: BC Managed Care – PPO | Admitting: Podiatry

## 2022-04-09 ENCOUNTER — Encounter: Payer: Self-pay | Admitting: Internal Medicine

## 2022-04-09 ENCOUNTER — Ambulatory Visit (INDEPENDENT_AMBULATORY_CARE_PROVIDER_SITE_OTHER): Payer: BC Managed Care – PPO | Admitting: Internal Medicine

## 2022-04-09 VITALS — BP 122/80 | HR 80 | Temp 98.2°F | Ht 63.8 in | Wt 223.4 lb

## 2022-04-09 DIAGNOSIS — N289 Disorder of kidney and ureter, unspecified: Secondary | ICD-10-CM

## 2022-04-09 DIAGNOSIS — E890 Postprocedural hypothyroidism: Secondary | ICD-10-CM | POA: Diagnosis not present

## 2022-04-09 DIAGNOSIS — Z23 Encounter for immunization: Secondary | ICD-10-CM

## 2022-04-09 DIAGNOSIS — R7303 Prediabetes: Secondary | ICD-10-CM | POA: Insufficient documentation

## 2022-04-09 DIAGNOSIS — Z6838 Body mass index (BMI) 38.0-38.9, adult: Secondary | ICD-10-CM

## 2022-04-09 DIAGNOSIS — R002 Palpitations: Secondary | ICD-10-CM | POA: Diagnosis not present

## 2022-04-09 DIAGNOSIS — Z0000 Encounter for general adult medical examination without abnormal findings: Secondary | ICD-10-CM

## 2022-04-09 DIAGNOSIS — Z85850 Personal history of malignant neoplasm of thyroid: Secondary | ICD-10-CM

## 2022-04-09 LAB — POCT URINALYSIS DIPSTICK
Bilirubin, UA: NEGATIVE
Glucose, UA: NEGATIVE
Ketones, UA: NEGATIVE
Leukocytes, UA: NEGATIVE
Nitrite, UA: NEGATIVE
Protein, UA: NEGATIVE
Spec Grav, UA: 1.015 (ref 1.010–1.025)
Urobilinogen, UA: 0.2 E.U./dL
pH, UA: 6 (ref 5.0–8.0)

## 2022-04-09 NOTE — Progress Notes (Signed)
Dana Vasquez,acting as a Education administrator for Dana Greenland, MD.,have documented all relevant documentation on the behalf of Dana Greenland, MD,as directed by  Dana Greenland, MD while in the presence of Dana Greenland, MD.  This visit occurred during the SARS-CoV-2 public health emergency.  Safety protocols were in place, including screening questions prior to the visit, additional usage of staff PPE, and extensive cleaning of exam room while observing appropriate contact time as indicated for disinfecting solutions.  Subjective:     Patient ID: Dana Vasquez , female    DOB: [birth year] , 56 y.o.   MRN: [id number]   Chief Complaint  Patient presents with   Annual Exam    HPI  She is here today for a full physical examination. She is followed by GYN for her pelvic exams. Patient reported she has been having heart palpitations for the past couple of weeks. She is not sure what triggers her sx. Usually occurs at rest. There is no associated cp/sob.      Past Medical History:  Diagnosis Date   Allergy    Anemia    Arthritis    knees  AND SHOULDERS   Asthma    allergy related - rarely uses inhaler   Bruises easily    Bunion    Cancer ([place])    1995;thyroid cancer, SURGERY AND RADIOACTIVE IODINE   Ectopic pregnancy    Euthyroid    Fatigue    Fibromyalgia    GERD (gastroesophageal reflux disease)    Headache(784.0)    otc med prn - last one 02/2014   History of blood transfusion 2000   In [place], [place] at [place] - ? 4 units transfused   Hypothyroidism    Knee pain    Plantar fasciitis    Sleep apnea    uses CPAP   SVD (spontaneous vaginal delivery)    x 3   Thyroid disease    thyroidectomy 1995     Family History  Problem Relation Age of Onset   Hypertension Mother    Cancer Mother        uterine   Cancer Father    Cancer Brother        stomach; 30   Early death Brother    Stroke Maternal Uncle    Heart disease Maternal Uncle    Food Allergy Daughter     Allergic rhinitis Daughter    Allergic rhinitis Daughter    Angioedema Neg Hx    Asthma Neg Hx    Eczema Neg Hx    Immunodeficiency Neg Hx    Urticaria Neg Hx    Atopy Neg Hx      Current Outpatient Medications:    albuterol (VENTOLIN HFA) 108 (90 Base) MCG/ACT inhaler, INHALE 2 PUFFS INTO THE LUNGS EVERY 6 HOURS AS NEEDED FOR WHEEZING OR SHORTNESS OF BREATH, Disp: 54 g, Rfl: 3   cetirizine (ZYRTEC) 10 MG tablet, Take 1 tablet (10 mg total) by mouth daily., Disp: 90 tablet, Rfl: 2   EPINEPHrine (AUVI-Q) 0.3 mg/0.3 mL IJ SOAJ injection, Inject 0.3 mg into the muscle as needed for anaphylaxis. As directed for life-threatening allergic reactions, Disp: 2 each, Rfl: 2   levothyroxine (SYNTHROID) 112 MCG tablet, TAKE 1 TABLET(112 MCG) BY MOUTH DAILY, Disp: 90 tablet, Rfl: 1   Allergies  Allergen Reactions   Amoxicillin Other (See Comments)    Headache Has patient had a PCN reaction causing immediate rash, facial/tongue/throat swelling, SOB or lightheadedness with hypotension:  No Has patient had a PCN reaction causing severe rash involving mucus membranes or skin necrosis: No Has patient had a PCN reaction that required hospitalization: No Has patient had a PCN reaction occurring within the last 10 years: Unknown If all of the above answers are "NO", then may proceed with Cephalosporin use.    Nitrofurantoin Monohyd Macro Other (See Comments)    Pt stated she gets a bad yeast infection   Dexilant [Dexlansoprazole]     "bad stomachache"   Meloxicam     Upset stomach and stomach pains   Budesonide Rash   Formoterol Rash   Formoterol Fumarate Rash      The patient states she uses post menopausal status for birth control. Last LMP was Patient's last menstrual period was 04/07/2014.. Negative for Dysmenorrhea. Negative for: breast discharge, breast lump(s), breast pain and breast self exam. Associated symptoms include abnormal vaginal bleeding. Pertinent negatives include abnormal  bleeding (hematology), anxiety, decreased libido, depression, difficulty falling sleep, dyspareunia, history of infertility, nocturia, sexual dysfunction, sleep disturbances, urinary incontinence, urinary urgency, vaginal discharge and vaginal itching. Diet regular.The patient states her exercise level is  walking.   . The patient's tobacco use is:  Social History   Tobacco Use  Smoking Status Never  Smokeless Tobacco Never  . She has been exposed to passive smoke. The patient's alcohol use is:  Social History   Substance and Sexual Activity  Alcohol Use Yes   Alcohol/week: 0.0 standard drinks of alcohol   Comment: occ   Review of Systems  Constitutional: Negative.   HENT: Negative.    Eyes: Negative.   Respiratory: Negative.  Negative for shortness of breath.   Cardiovascular:  Positive for palpitations. Negative for chest pain.       She c/o random palpitations. She reports it usually occurs at rest. Her sx started about 2-3 weeks ago. She denies drinking caffeinated beverages. She denies associated cp, sob.  States she sometimes feels it in her throat.   Gastrointestinal: Negative.   Endocrine: Negative.   Genitourinary: Negative.   Musculoskeletal: Negative.   Skin: Negative.   Allergic/Immunologic: Negative.   Neurological: Negative.   Hematological: Negative.   Psychiatric/Behavioral: Negative.     Today's Vitals   04/09/22 0917  BP: 122/80  Pulse: 80  Temp: 98.2 F (36.8 C)  Weight: 223 lb 6.4 oz (101.3 kg)  Height: 5' 3.8" (1.621 m)  PainSc: 0-No pain   Body mass index is 38.59 kg/m.  Wt Readings from Last 3 Encounters:  04/09/22 223 lb 6.4 oz (101.3 kg)  12/04/21 222 lb 12.8 oz (101.1 kg)  05/30/21 220 lb 11.2 oz (100.1 kg)     Objective:  Physical Exam Vitals and nursing note reviewed.  Constitutional:      Appearance: Normal appearance.  HENT:     Head: Normocephalic and atraumatic.     Right Ear: Tympanic membrane, ear canal and external ear  normal.     Left Ear: Tympanic membrane, ear canal and external ear normal.     Nose: Nose normal.     Mouth/Throat:     Mouth: Mucous membranes are moist.     Pharynx: Oropharynx is clear.  Eyes:     Extraocular Movements: Extraocular movements intact.     Conjunctiva/sclera: Conjunctivae normal.     Pupils: Pupils are equal, round, and reactive to light.  Cardiovascular:     Rate and Rhythm: Normal rate and regular rhythm.     Pulses: Normal pulses.  Dorsalis pedis pulses are 2+ on the right side and 2+ on the left side.     Heart sounds: Normal heart sounds.  Pulmonary:     Effort: Pulmonary effort is normal.     Breath sounds: Normal breath sounds.  Abdominal:     General: Abdomen is flat. Bowel sounds are normal.     Palpations: Abdomen is soft.  Genitourinary:    Comments: deferred Musculoskeletal:        General: Normal range of motion.     Cervical back: Normal range of motion and neck supple.  Feet:     Right foot:     Protective Sensation: 5 sites tested.  5 sites sensed.     Skin integrity: Dry skin present.     Toenail Condition: Right toenails are normal.     Left foot:     Protective Sensation: 5 sites tested.  5 sites sensed.     Skin integrity: Dry skin present.     Toenail Condition: Left toenails are normal.     Comments: Healed surgical scar on right foot Skin:    General: Skin is warm and dry.  Neurological:     General: No focal deficit present.     Mental Status: She is alert and oriented to person, place, and time.  Psychiatric:        Mood and Affect: Mood normal.        Behavior: Behavior normal.      Assessment And Plan:     1. Encounter for general adult medical examination without abnormal findings Comments: A full exam was performed. Importance of monthly self breast exams was discussed with the patient. Last colonoscopy was in 2018. PATIENT IS ADVISED TO GET 30-45 MINUTES REGULAR EXERCISE NO LESS THAN FOUR TO FIVE DAYS PER WEEK -  BOTH WEIGHTBEARING EXERCISES AND AEROBIC ARE RECOMMENDED.  PATIENT IS ADVISED TO FOLLOW A HEALTHY DIET WITH AT LEAST SIX FRUITS/VEGGIES PER DAY, DECREASE INTAKE OF RED MEAT, AND TO INCREASE FISH INTAKE TO TWO DAYS PER WEEK.  MEATS/FISH SHOULD NOT BE FRIED, BAKED OR BROILED IS PREFERABLE.  IT IS ALSO IMPORTANT TO CUT BACK ON YOUR SUGAR INTAKE. PLEASE AVOID ANYTHING WITH ADDED SUGAR, CORN SYRUP OR OTHER SWEETENERS. IF YOU MUST USE A SWEETENER, YOU CAN TRY STEVIA. IT IS ALSO IMPORTANT TO AVOID ARTIFICIALLY SWEETENERS AND DIET BEVERAGES. LASTLY, I SUGGEST WEARING SPF 50 SUNSCREEN ON EXPOSED PARTS AND ESPECIALLY WHEN IN THE DIRECT SUNLIGHT FOR AN EXTENDED PERIOD OF TIME.  PLEASE AVOID FAST FOOD RESTAURANTS AND INCREASE YOUR WATER INTAKE.  - CBC - CMP14+EGFR - Lipid panel - Hemoglobin A1c  2. Palpitations Comments: I will check labs as below. EKG performed, NSR w/o acute changes. She may benefit from Mg supplementation. I will also refer her to Cardiology for further evaluation.  - EKG 12-Lead - Ambulatory referral to Cardiology - TSH + free T4 - Magnesium  3. Renal insufficiency Comments: I will check labs as below. She is encouraged to stay well hydrated and avoid NSAIDS. She was also given information to read on CKD.  - POCT Urinalysis Dipstick (81002) - Microalbumin / creatinine urine ratio  4. Postoperative hypothyroidism Comments: I will check thyroid panel today and adjust meds as needed.  - TSH + free T4 - Thyroglobulin antibody  5. Class 2 severe obesity due to excess calories with serious comorbidity and body mass index (BMI) of 38.0 to 38.9 in adult [facility]) Comments: She is encouraged to incorporate more strength training  into her exercise routine while striving for BMI<30 to decrease cardiac risk.  She is also advised to avoid artificially sweetened beverages and to decrease her intake of Vitamin Water.   6. Personal history of malignant neoplasm of thyroid Comments: I will check  thyroglobulin Ab today.  She is no longer followed by Endo.   7. Immunization due - Varicella-zoster vaccine IM (Shingrix)  Patient was given opportunity to ask questions. Patient verbalized understanding of the plan and was able to repeat key elements of the plan. All questions were answered to their satisfaction.   I, Dana Greenland, MD, have reviewed all documentation for this visit. The documentation on 04/09/22 for the exam, diagnosis, procedures, and orders are all accurate and complete.   THE PATIENT IS ENCOURAGED TO PRACTICE SOCIAL DISTANCING DUE TO THE COVID-19 PANDEMIC.

## 2022-04-09 NOTE — Patient Instructions (Addendum)
Chronic Kidney Disease, Adult Chronic kidney disease (CKD) occurs when the kidneys are slowly and permanently damaged over a long period of time. The kidneys are a pair of organs that do many important jobs in the body, including: Removing waste and extra fluid from the blood to make urine. Making hormones that maintain the amount of fluid in tissues and blood vessels. Maintaining the right amount of fluids and chemicals in the body. A small amount of kidney damage may not cause problems, but a large amount of damage may make it hard or impossible for the kidneys to work right. Steps must be taken to slow kidney damage or to stop it from getting worse. If steps are not taken, the kidneys may stop working permanently (end-stage renal disease, or ESRD). Most of the time, CKD does not go away, but it can often be controlled. People who have CKD are usually able to live full lives. What are the causes? The most common causes of this condition are diabetes and high blood pressure (hypertension). Other causes include: Cardiovascular diseases. These affect the heart and blood vessels. Kidney diseases. These include: Glomerulonephritis, or inflammation of the tiny filters in the kidneys. Interstitial nephritis. This is swelling of the small tubes of the kidneys and of the surrounding structures. Polycystic kidney disease, in which clusters of fluid-filled sacs form within the kidneys. Renal vascular disease. This includes disorders that affect the arteries and veins of the kidneys. Diseases that affect the body's defense system (immune system). A problem with urine flow. This may be caused by: Kidney stones. Cancer. An enlarged prostate, in males. A kidney infection or urinary tract infection (UTI) that keeps coming back. Vasculitis. This is swelling or inflammation of the blood vessels. What increases the risk? Your chances of having kidney disease increase with age. The following factors may make  you more likely to develop this condition: A family history of kidney disease or kidney failure. Kidney failure means the kidneys can no longer work right. Certain genetic diseases. Taking medicines often that are damaging to the kidneys. Being around or being in contact with toxic substances. Obesity. A history of tobacco use. What are the signs or symptoms? Symptoms of this condition include: Feeling very tired (lethargic) and having less energy. Swelling, or edema, of the face, legs, ankles, or feet. Nausea or vomiting, or loss of appetite. Confusion or trouble concentrating. Muscle twitches and cramps, especially in the legs. Dry, itchy skin. A metallic taste in the mouth. Producing less urine, or producing more urine (especially at night). Shortness of breath. Trouble sleeping. CKD may also result in not having enough red blood cells or hemoglobin in the blood (anemia) or having weak bones (bone disease). Symptoms develop slowly and may not be obvious until the kidney damage becomes severe. It is possible to have kidney disease for years without having symptoms. How is this diagnosed? This condition may be diagnosed based on: Blood tests. Urine tests. Imaging tests, such as an ultrasound or a CT scan. A kidney biopsy. This involves removing a sample of kidney tissue to be looked at under a microscope. Results from these tests will help to determine how serious the CKD is. How is this treated? There is no cure for most cases of this condition, but treatment usually relieves symptoms and prevents or slows the worsening of the disease. Treatment may include: Diet changes, which may require you to avoid alcohol and foods that are high in salt, potassium, phosphorous, and protein. Medicines. These may:  Lower blood pressure. Control blood sugar (glucose). Relieve anemia. Relieve swelling. Protect your bones. Improve the balance of salts and minerals in your blood  (electrolytes). Dialysis, which is a type of treatment that removes toxic waste from the body. It may be needed if you have kidney failure. Managing any other conditions that are causing your CKD or making it worse. Follow these instructions at home: Medicines Take over-the-counter and prescription medicines only as told by your health care provider. The amount of some medicines that you take may need to be changed. Do not take any new medicines unless approved by your health care provider. Many medicines can make kidney damage worse. Do not take any vitamin and mineral supplements unless approved by your health care provider. Many nutritional supplements can make kidney damage worse. Lifestyle  Do not use any products that contain nicotine or tobacco, such as cigarettes, e-cigarettes, and chewing tobacco. If you need help quitting, ask your health care provider. If you drink alcohol: Limit how much you use to: 0-1 drink a day for women who are not pregnant. 0-2 drinks a day for men. Know how much alcohol is in your drink. In the [place] one drink equals one 12 oz bottle of beer (355 mL), one 5 oz glass of wine (148 mL), or one 1 oz glass of hard liquor (44 mL). Maintain a healthy weight. If you need help, ask your health care provider. General instructions  Follow instructions from your health care provider about eating or drinking restrictions, including any prescribed diet. Track your blood pressure at home. Report changes in your blood pressure as told. If you are being treated for diabetes, track your blood glucose levels as told. Start or continue an exercise plan. Exercise at least 30 minutes a day, 5 days a week. Keep your immunizations up to date as told. Keep all follow-up visits. This is important. Where to find more information American Association of Kidney Patients: BombTimer.gl National Kidney Foundation: www.kidney.[place]: https://mathis.com/ Life Options:  www.lifeoptions.org Kidney School: www.kidneyschool.org Contact a health care provider if: Your symptoms get worse. You develop new symptoms. Get help right away if: You develop symptoms of ESRD. These include: Headaches. Numbness in your hands or feet. Easy bruising. Frequent hiccups. Chest pain. Shortness of breath. Lack of menstrual periods, in women. You have a fever. You are producing less urine than usual. You have pain or bleeding when you urinate or when you have a bowel movement. These symptoms may represent a serious problem that is an emergency. Do not wait to see if the symptoms will go away. Get medical help right away. Call your local emergency services (911 in the [place] Do not drive yourself to the hospital. Summary Chronic kidney disease (CKD) occurs when the kidneys become damaged slowly over a long period of time. The most common causes of this condition are diabetes and high blood pressure (hypertension). There is no cure for most cases of CKD, but treatment usually relieves symptoms and prevents or slows the worsening of the disease. Treatment may include a combination of lifestyle changes, medicines, and dialysis. This information is not intended to replace advice given to you by your health care provider. Make sure you discuss any questions you have with your health care provider. Document Revised: 01/19/2020 Document Reviewed: 01/19/2020 Elsevier Patient Education  [place] Maintenance, Female Adopting a healthy lifestyle and getting preventive care are important in promoting health and wellness. Ask your health care provider  about: The right schedule for you to have regular tests and exams. Things you can do on your own to prevent diseases and keep yourself healthy. What should I know about diet, weight, and exercise? Eat a healthy diet  Eat a diet that includes plenty of vegetables, fruits, low-fat dairy products, and lean protein. Do  not eat a lot of foods that are high in solid fats, added sugars, or sodium. Maintain a healthy weight Body mass index (BMI) is used to identify weight problems. It estimates body fat based on height and weight. Your health care provider can help determine your BMI and help you achieve or maintain a healthy weight. Get regular exercise Get regular exercise. This is one of the most important things you can do for your health. Most adults should: Exercise for at least 150 minutes each week. The exercise should increase your heart rate and make you sweat (moderate-intensity exercise). Do strengthening exercises at least twice a week. This is in addition to the moderate-intensity exercise. Spend less time sitting. Even light physical activity can be beneficial. Watch cholesterol and blood lipids Have your blood tested for lipids and cholesterol at 55 years of age, then have this test every 5 years. Have your cholesterol levels checked more often if: Your lipid or cholesterol levels are high. You are older than 55 years of age. You are at high risk for heart disease. What should I know about cancer screening? Depending on your health history and family history, you may need to have cancer screening at various ages. This may include screening for: Breast cancer. Cervical cancer. Colorectal cancer. Skin cancer. Lung cancer. What should I know about heart disease, diabetes, and high blood pressure? Blood pressure and heart disease High blood pressure causes heart disease and increases the risk of stroke. This is more likely to develop in people who have high blood pressure readings or are overweight. Have your blood pressure checked: Every 3-5 years if you are 40-75 years of age. Every year if you are 86 years old or older. Diabetes Have regular diabetes screenings. This checks your fasting blood sugar level. Have the screening done: Once every three years after age 34 if you are at a normal  weight and have a low risk for diabetes. More often and at a younger age if you are overweight or have a high risk for diabetes. What should I know about preventing infection? Hepatitis B If you have a higher risk for hepatitis B, you should be screened for this virus. Talk with your health care provider to find out if you are at risk for hepatitis B infection. Hepatitis C Testing is recommended for: Everyone born from [birth year] through 1964. Anyone with known risk factors for hepatitis C. Sexually transmitted infections (STIs) Get screened for STIs, including gonorrhea and chlamydia, if: You are sexually active and are younger than 55 years of age. You are older than 55 years of age and your health care provider tells you that you are at risk for this type of infection. Your sexual activity has changed since you were last screened, and you are at increased risk for chlamydia or gonorrhea. Ask your health care provider if you are at risk. Ask your health care provider about whether you are at high risk for HIV. Your health care provider may recommend a prescription medicine to help prevent HIV infection. If you choose to take medicine to prevent HIV, you should first get tested for HIV. You should then be tested  every 3 months for as long as you are taking the medicine. Pregnancy If you are about to stop having your period (premenopausal) and you may become pregnant, seek counseling before you get pregnant. Take 400 to 800 micrograms (mcg) of folic acid every day if you become pregnant. Ask for birth control (contraception) if you want to prevent pregnancy. Osteoporosis and menopause Osteoporosis is a disease in which the bones lose minerals and strength with aging. This can result in bone fractures. If you are 34 years old or older, or if you are at risk for osteoporosis and fractures, ask your health care provider if you should: Be screened for bone loss. Take a calcium or vitamin D supplement to  lower your risk of fractures. Be given hormone replacement therapy (HRT) to treat symptoms of menopause. Follow these instructions at home: Alcohol use Do not drink alcohol if: Your health care provider tells you not to drink. You are pregnant, may be pregnant, or are planning to become pregnant. If you drink alcohol: Limit how much you have to: 0-1 drink a day. Know how much alcohol is in your drink. In the [place].S., one drink equals one 12 oz bottle of beer (355 mL), one 5 oz glass of wine (148 mL), or one 1 oz glass of hard liquor (44 mL). Lifestyle Do not use any products that contain nicotine or tobacco. These products include cigarettes, chewing tobacco, and vaping devices, such as e-cigarettes. If you need help quitting, ask your health care provider. Do not use street drugs. Do not share needles. Ask your health care provider for help if you need support or information about quitting drugs. General instructions Schedule regular health, dental, and eye exams. Stay current with your vaccines. Tell your health care provider if: You often feel depressed. You have ever been abused or do not feel safe at home. Summary Adopting a healthy lifestyle and getting preventive care are important in promoting health and wellness. Follow your health care provider's instructions about healthy diet, exercising, and getting tested or screened for diseases. Follow your health care provider's instructions on monitoring your cholesterol and blood pressure. This information is not intended to replace advice given to you by your health care provider. Make sure you discuss any questions you have with your health care provider. Document Revised: 03/05/2021 Document Reviewed: 03/05/2021 Elsevier Patient Education  [place].

## 2022-04-10 ENCOUNTER — Encounter: Payer: BC Managed Care – PPO | Admitting: Nurse Practitioner

## 2022-04-10 ENCOUNTER — Encounter: Payer: Self-pay | Admitting: Internal Medicine

## 2022-04-10 LAB — CMP14+EGFR
ALT: 13 IU/L (ref 0–32)
AST: 9 IU/L (ref 0–40)
Albumin/Globulin Ratio: 1.4 (ref 1.2–2.2)
Albumin: 4.4 g/dL (ref 3.8–4.9)
Alkaline Phosphatase: 84 IU/L (ref 44–121)
BUN/Creatinine Ratio: 13 (ref 9–23)
BUN: 12 mg/dL (ref 6–24)
Bilirubin Total: 0.3 mg/dL (ref 0.0–1.2)
CO2: 23 mmol/L (ref 20–29)
Calcium: 9.2 mg/dL (ref 8.7–10.2)
Chloride: 105 mmol/L (ref 96–106)
Creatinine, Ser: 0.94 mg/dL (ref 0.57–1.00)
Globulin, Total: 3.2 g/dL (ref 1.5–4.5)
Glucose: 95 mg/dL (ref 70–99)
Potassium: 4.2 mmol/L (ref 3.5–5.2)
Sodium: 143 mmol/L (ref 134–144)
Total Protein: 7.6 g/dL (ref 6.0–8.5)
eGFR: 72 mL/min/{1.73_m2} (ref >59)

## 2022-04-10 LAB — TSH+FREE T4
Free T4: 1.67 ng/dL (ref 0.82–1.77)
TSH: 0.918 u[IU]/mL (ref 0.450–4.500)

## 2022-04-10 LAB — CBC
Hematocrit: 36.7 % (ref 34.0–46.6)
Hemoglobin: 11.6 g/dL (ref 11.1–15.9)
MCH: 25.6 pg — ABNORMAL LOW (ref 26.6–33.0)
MCHC: 31.6 g/dL (ref 31.5–35.7)
MCV: 81 fL (ref 79–97)
Platelets: 314 10*3/uL (ref 150–450)
RBC: 4.53 x10E6/uL (ref 3.77–5.28)
RDW: 14.6 % (ref 11.7–15.4)
WBC: 7.7 10*3/uL (ref 3.4–10.8)

## 2022-04-10 LAB — MICROALBUMIN / CREATININE URINE RATIO
Creatinine, Urine: 85 mg/dL
Microalb/Creat Ratio: 11 mg/g creat (ref 0–29)
Microalbumin, Urine: 9.2 ug/mL

## 2022-04-10 LAB — THYROGLOBULIN ANTIBODY: Thyroglobulin Antibody: <1 IU/mL (ref 0.0–0.9)

## 2022-04-10 LAB — LIPID PANEL
Chol/HDL Ratio: 3 ratio (ref 0.0–4.4)
Cholesterol, Total: 210 mg/dL — ABNORMAL HIGH (ref 100–199)
HDL: 71 mg/dL (ref >39)
LDL Chol Calc (NIH): 129 mg/dL — ABNORMAL HIGH (ref 0–99)
Triglycerides: 54 mg/dL (ref 0–149)
VLDL Cholesterol Cal: 10 mg/dL (ref 5–40)

## 2022-04-10 LAB — HEMOGLOBIN A1C
Est. average glucose Bld gHb Est-mCnc: 117 mg/dL
Hgb A1c MFr Bld: 5.7 % — ABNORMAL HIGH (ref 4.8–5.6)

## 2022-04-10 LAB — MAGNESIUM: Magnesium: 2.3 mg/dL (ref 1.6–2.3)

## 2022-04-10 NOTE — Progress Notes (Signed)
Cardiology Office Note   Date:  04/11/2022   ID:  Dana Vasquez, DOB [birth year], MRN [id number]  PCP:  Glendale Chard, MD  Cardiologist:   Shamara Soza [place], MD   Chief Complaint  Patient presents with   New Patient (Initial Visit)   Palpitations      History of Present Illness: Dana Vasquez is a 56 y.o. female who is seen at the request of Dr Baird Cancer for evaluation of palpitations. She has a history of OSA- resolved after weight loss with bariatric surgery,  anemia, and hypothyroidism following thyroidectomy. She describes mild symptoms of fluttering in her throat. This has occurred off and on for the past month. Worse when under stress. Notes mild skip. No pain, dizziness, syncope or SOB. Notes she is going through menopause.   In the past she noted some similar fluttering symptoms and intermittent chest tightness. She was seen by Dr Virgina Jock and had a negative ETT. Event monitor was done (only able to wear for 2 weeks due to adhesive allergy). This showed only sinus tachycardia without arrhythmia. She did have an Echo in 2021 which was normal. Recent TFTS normal. Cholesterol is elevated and she is planning to resume atorvastatin which she took in the past.     Past Medical History:  Diagnosis Date   Allergy    Anemia    Arthritis    knees  AND SHOULDERS   Asthma    allergy related - rarely uses inhaler   Bruises easily    Bunion    Cancer ([place])    1995;thyroid cancer, SURGERY AND RADIOACTIVE IODINE   Ectopic pregnancy    Euthyroid    Fatigue    Fibromyalgia    GERD (gastroesophageal reflux disease)    Headache(784.0)    otc med prn - last one 02/2014   History of blood transfusion 2000   In [place], [place] at [facility] - ? 4 units transfused   Hypothyroidism    Knee pain    Plantar fasciitis    Sleep apnea    uses CPAP   SVD (spontaneous vaginal delivery)    x 3   Thyroid disease    thyroidectomy 1995    Past Surgical History:  Procedure Laterality Date    ABDOMINAL HYSTERECTOMY N/A 04/20/2014   Procedure: Total ABDOMINAL HYSTERECTOMY partial right salpingectomy;  Surgeon: Eldred Manges, MD;  Location: [facility];  Service: Gynecology;  Laterality: N/A;   BREATH TEK H PYLORI  09/18/2011   Procedure: BREATH TEK H PYLORI;  Surgeon: Pedro Earls, MD;  Location: [facility];  Service: General;  Laterality: N/A;   BUNIONECTOMY     left;2015   BUNIONECTOMY     COLONOSCOPY     DILATION AND CURETTAGE OF UTERUS  1988   endometriosis   ECTOPIC PREGNANCY SURGERY     fallopian tubes removed     left tube removed per patient - laparotomy   FOOT SURGERY Right 2019   FOOT SURGERY Bilateral 2021   LAPAROSCOPIC GASTRIC SLEEVE RESECTION N/A 04/14/2018   Procedure: LAPAROSCOPIC GASTRIC SLEEVE RESECTION WITH UPPER ENDO AND ERAS PATHWAY;  Surgeon: Johnathan Hausen, MD;  Location: [facility];  Service: General;  Laterality: N/A;   LYSIS OF ADHESION N/A 04/20/2014   Procedure: LYSIS OF ADHESION;  Surgeon: Eldred Manges, MD;  Location: [facility];  Service: Gynecology;  Laterality: N/A;   THYROIDECTOMY  1995   TUBAL LIGATION     WISDOM TOOTH EXTRACTION  Current Outpatient Medications  Medication Sig Dispense Refill   albuterol (VENTOLIN HFA) 108 (90 Base) MCG/ACT inhaler INHALE 2 PUFFS INTO THE LUNGS EVERY 6 HOURS AS NEEDED FOR WHEEZING OR SHORTNESS OF BREATH 54 g 3   cetirizine (ZYRTEC) 10 MG tablet Take 1 tablet (10 mg total) by mouth daily. 90 tablet 2   EPINEPHrine (AUVI-Q) 0.3 mg/0.3 mL IJ SOAJ injection Inject 0.3 mg into the muscle as needed for anaphylaxis. As directed for life-threatening allergic reactions 2 each 2   levothyroxine (SYNTHROID) 112 MCG tablet TAKE 1 TABLET(112 MCG) BY MOUTH DAILY 90 tablet 1   No current facility-administered medications for this visit.    Allergies:   Amoxicillin, Nitrofurantoin monohyd macro, Dexilant [dexlansoprazole], Meloxicam, Budesonide, Formoterol, and Formoterol fumarate    Social History:  The patient   reports that she has never smoked. She has never used smokeless tobacco. She reports current alcohol use. She reports that she does not use drugs.   Family History:  The patient's family history includes Allergic rhinitis in her daughter and daughter; Cancer in her brother, father, and mother; Early death in her brother; Food Allergy in her daughter; Heart disease in her maternal uncle; Hypertension in her mother; Stroke in her maternal uncle.    ROS:  Please see the history of present illness.   Otherwise, review of systems are positive for none.   All other systems are reviewed and negative.    PHYSICAL EXAM: VS:  BP 106/82 (BP Location: Left Arm, Patient Position: Sitting, Cuff Size: Large)   Pulse 66   Ht '5\' 4"'$  (1.626 m)   Wt 224 lb (101.6 kg)   LMP 04/07/2014   BMI 38.45 kg/m  , BMI Body mass index is 38.45 kg/m. GEN: Well nourished, well developed, in no acute distress HEENT: normal Neck: no JVD, carotid bruits, or masses Cardiac: RRR; no murmurs, rubs, or gallops,no edema  Respiratory:  clear to auscultation bilaterally, normal work of breathing GI: soft, nontender, nondistended, + BS MS: no deformity or atrophy Skin: warm and dry, no rash Neuro:  Strength and sensation are intact Psych: euthymic mood, full affect   EKG:  EKG is not ordered today. The ekg ordered 04/09/22 demonstrates NSR rate 68. Normal. I have personally reviewed and interpreted this study.    Recent Labs: 04/09/2022: ALT 13; BUN 12; Creatinine, Ser 0.94; Hemoglobin 11.6; Magnesium 2.3; Platelets 314; Potassium 4.2; Sodium 143; TSH 0.918    Lipid Panel    Component Value Date/Time   CHOL 210 (H) 04/09/2022 1028   TRIG 54 04/09/2022 1028   HDL 71 04/09/2022 1028   CHOLHDL 3.0 04/09/2022 1028   CHOLHDL 3.7 10/10/2011 0914   VLDL 8 10/10/2011 0914   LDLCALC 129 (H) 04/09/2022 1028      Wt Readings from Last 3 Encounters:  04/11/22 224 lb (101.6 kg)  04/09/22 223 lb 6.4 oz (101.3 kg)   12/04/21 222 lb 12.8 oz (101.1 kg)      Other studies Reviewed: Additional studies/ records that were reviewed today include:   Event monitor 12/28/2018 - 01/19/2019: Sinus rhythm. Tachycardia up to 155 bpm, bradycardia up to 49 bpm.  No atrial fibrillation/atrial flutter/SVT/VT/high grade AV block noted.   Exercise Treadmill Stress Test 01/06/2019: Indication: Chest pain/pressure, near-syncope, palpitation The patient exercised on Bruce protocol for  06:02 min. Patient achieved  7.10 METS and reached HR  150 bpm, which is  88 % of maximum age-predicted HR.  Stress test terminated due to fatigue.  Normal BP response.  Chest Pain: none. ST Changes: With peak exercise there was no ST-T changes of ischemia. Arrhythmias: none. HR Response to Exercise: Appropriate. Exercise capacity was  low for age. Recommendations: Continue primary/secondary prevention.    Echo 11/23/19: IMPRESSIONS     1. Left ventricular ejection fraction, by visual estimation, is 60 to  65%. The left ventricle has normal function. There is no left ventricular  hypertrophy.   2. The left ventricle has no regional wall motion abnormalities.   3. Global right ventricle has normal systolic function.The right  ventricular size is normal. No increase in right ventricular wall  thickness.   4. Left atrial size was normal.   5. Right atrial size was normal.   6. The mitral valve is normal in structure. No evidence of mitral valve  regurgitation. No evidence of mitral stenosis.   7. The tricuspid valve is normal in structure.   8. The tricuspid valve is normal in structure. Tricuspid valve  regurgitation is trivial.   9. The aortic valve is normal in structure. Aortic valve regurgitation is  not visualized. No evidence of aortic valve sclerosis or stenosis.  10. The pulmonic valve was normal in structure. Pulmonic valve  regurgitation is not visualized.  11. Normal pulmonary artery systolic pressure.  12. The atrial  septum is grossly normal.   ASSESSMENT AND PLAN:  1.  Mild symptoms of fluttering exacerbated by stress. Extensive evaluation in past with monitor, ETT and Echo which were unremarkable. I have reassured her about this. I don't feel further evaluation is needed at this time. She has an Apple watch and Cardiomobile device so can monitor rhythm and let us know if it is abnormal. Will follow up prn.  2. HLD planning to resume statin    Current medicines are reviewed at length with the patient today.  The patient does not have concerns regarding medicines.  The following changes have been made:  no change  Labs/ tests ordered today include:  No orders of the defined types were placed in this encounter.        Disposition:   FU PRN  Signed, Karthik Whittinghill Martinique, MD  04/11/2022 12:38 PM    [facility] [address] [place], [place], [address] Phone [phone], Fax [phone]  ]

## 2022-04-11 ENCOUNTER — Encounter: Payer: Self-pay | Admitting: Cardiology

## 2022-04-11 ENCOUNTER — Ambulatory Visit: Payer: BC Managed Care – PPO | Admitting: Cardiology

## 2022-04-11 ENCOUNTER — Ambulatory Visit: Payer: BC Managed Care – PPO | Admitting: Podiatry

## 2022-04-11 VITALS — BP 106/82 | HR 66 | Ht 64.0 in | Wt 224.0 lb

## 2022-04-11 DIAGNOSIS — R002 Palpitations: Secondary | ICD-10-CM | POA: Diagnosis not present

## 2022-04-11 DIAGNOSIS — E7800 Pure hypercholesterolemia, unspecified: Secondary | ICD-10-CM | POA: Diagnosis not present

## 2022-04-11 NOTE — Patient Instructions (Signed)
Medication Instructions:   No changes    Lab Work:  Not needed   Testing/Procedures: Not needed   Follow-Up: At [facility], you and your health needs are our priority.  As part of our continuing mission to provide you with exceptional heart care, we have created designated Provider Care Teams.  These Care Teams include your primary Cardiologist (physician) and Advanced Practice Providers (APPs -  Physician Assistants and Nurse Practitioners) who all work together to provide you with the care you need, when you need it.     Your next appointment:   As needed  The format for your next appointment:   In Person  Provider:   Peter Martinique, MD

## 2022-04-17 ENCOUNTER — Encounter: Payer: Self-pay | Admitting: Podiatry

## 2022-04-17 ENCOUNTER — Ambulatory Visit (INDEPENDENT_AMBULATORY_CARE_PROVIDER_SITE_OTHER): Payer: BC Managed Care – PPO | Admitting: Podiatry

## 2022-04-17 ENCOUNTER — Ambulatory Visit (INDEPENDENT_AMBULATORY_CARE_PROVIDER_SITE_OTHER): Payer: BC Managed Care – PPO

## 2022-04-17 DIAGNOSIS — M722 Plantar fascial fibromatosis: Secondary | ICD-10-CM

## 2022-04-17 MED ORDER — TRIAMCINOLONE ACETONIDE 10 MG/ML IJ SUSP
10.0000 mg | Freq: Once | INTRAMUSCULAR | Status: AC
  Administered 2022-04-17: 10 mg

## 2022-04-17 NOTE — Patient Instructions (Signed)

## 2022-04-18 NOTE — Progress Notes (Signed)
Subjective:   Patient ID: Dana Vasquez, female   DOB: 56 y.o.   MRN: [id number]   HPI Patient states her right heel has started to hurt her more and it is worse when she first gets up in the morning and worse after periods of sitting   ROS      Objective:  Physical Exam  Neurovascular status intact acute discomfort right plantar fascia worse after periods of immobilization sitting or sleeping     Assessment:  Acute Planter fasciitis right with inflammation fluid around the medial band with depression of the arch also noted     Plan:  H&P reviewed condition sterile prep injected the fascial band at insertion 3 mg Kenalog 5 mg Xylocaine applied fascial brace to lift up the arch gave instructions for inserts to use and explained for the brace to hold up the arch and take stress off the plantar fascial band itself.  Reappoint to recheck  X-rays do indicate spur no indication stress fracture arthritis

## 2022-05-08 ENCOUNTER — Ambulatory Visit (INDEPENDENT_AMBULATORY_CARE_PROVIDER_SITE_OTHER): Payer: BC Managed Care – PPO | Admitting: Podiatry

## 2022-05-08 ENCOUNTER — Encounter: Payer: Self-pay | Admitting: Podiatry

## 2022-05-08 DIAGNOSIS — M722 Plantar fascial fibromatosis: Secondary | ICD-10-CM

## 2022-05-08 MED ORDER — TRIAMCINOLONE ACETONIDE 10 MG/ML IJ SUSP
10.0000 mg | Freq: Once | INTRAMUSCULAR | Status: AC
  Administered 2022-05-08: 10 mg

## 2022-05-08 NOTE — Progress Notes (Signed)
Subjective:   Patient ID: Dana Vasquez, female   DOB: 56 y.o.   MRN: [id number]   HPI Patient states that her heel has been hurting her and only got better for about a week after her last visit.  States its intense when she walks on it and that it is hard for her to be active and she did have previous surgery on this years ago and does have orthotics   ROS      Objective:  Physical Exam  Neurovascular status intact muscle strength found to be adequate range of motion within normal limits.  Patient is noted to have exquisite discomfort that is now more distal into the fascial band itself with pain noted and cannot take oral anti-inflammatories due to stomach issues     Assessment:  Acute plantar fasciitis right with inflammation of the medial band its not responding to conservative care so far     Plan:  H&P reviewed condition and at this point recommended complete immobilization to offload weight off the heel.  Patient is placed in an air fracture walker with all instructions on usage and I did go ahead and I injected the medial band of the fascia 3 mg Kenalog 5 mg Xylocaine.  The air fracture walker was fitted properly for her and hopefully will take the pressure off and then I want her wearing orthotics with tennis shoes after she takes it off

## 2022-05-12 ENCOUNTER — Encounter: Payer: Self-pay | Admitting: Internal Medicine

## 2022-05-13 ENCOUNTER — Other Ambulatory Visit: Payer: Self-pay | Admitting: Internal Medicine

## 2022-05-13 DIAGNOSIS — M2550 Pain in unspecified joint: Secondary | ICD-10-CM

## 2022-05-14 ENCOUNTER — Other Ambulatory Visit: Payer: Self-pay | Admitting: Internal Medicine

## 2022-05-14 ENCOUNTER — Other Ambulatory Visit: Payer: BC Managed Care – PPO

## 2022-05-14 DIAGNOSIS — M2550 Pain in unspecified joint: Secondary | ICD-10-CM

## 2022-05-15 LAB — CYCLIC CITRUL PEPTIDE ANTIBODY, IGG/IGA: Cyclic Citrullin Peptide Ab: 5 units (ref 0–19)

## 2022-05-15 LAB — URIC ACID: Uric Acid: 5.6 mg/dL (ref 3.0–7.2)

## 2022-05-15 LAB — RHEUMATOID FACTOR: Rheumatoid fact SerPl-aCnc: <10 IU/mL (ref <14.0)

## 2022-05-15 LAB — SEDIMENTATION RATE: Sed Rate: 26 mm/hr (ref 0–40)

## 2022-05-17 LAB — ANTINUCLEAR ANTIBODIES, IFA: ANA Titer 1: NEGATIVE

## 2022-06-01 ENCOUNTER — Other Ambulatory Visit: Payer: Self-pay | Admitting: Internal Medicine

## 2022-06-06 ENCOUNTER — Ambulatory Visit (INDEPENDENT_AMBULATORY_CARE_PROVIDER_SITE_OTHER): Payer: BC Managed Care – PPO | Admitting: Podiatry

## 2022-06-06 ENCOUNTER — Encounter: Payer: Self-pay | Admitting: Podiatry

## 2022-06-06 DIAGNOSIS — M722 Plantar fascial fibromatosis: Secondary | ICD-10-CM

## 2022-06-06 DIAGNOSIS — S93691A Other sprain of right foot, initial encounter: Secondary | ICD-10-CM

## 2022-06-06 NOTE — Progress Notes (Signed)
She presents today after having seen Dr. Paulla Dolly 2 or 3 times with a chief complaint of painful Planter fasciitis.  He had put her in a boot which caused her knee to hurt and she states that the heel pain is still really just messed up it hurts badly.  She is referring to her right heel.  All conservative therapies she says is just not working.  Objective: Vital signs are stable alert oriented x 3.  Pulses are palpable.  Severe pain on palpation medial calcaneal tubercle of the right foot.  She also has some lateral fourth fifth tarsometatarsal joint pain.  She has no Achilles pain.  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  Plan: Discussed etiology pathology conservative surgical therapies at this point time we are requesting MRI of this right foot to evaluate for Planter fasciitis plantar fascial tear differential diagnoses and surgical consideration.  All conservative therapies have failed to render this patient asymptomatic.

## 2022-06-12 ENCOUNTER — Other Ambulatory Visit: Payer: Self-pay | Admitting: Internal Medicine

## 2022-06-12 ENCOUNTER — Ambulatory Visit
Admission: RE | Admit: 2022-06-12 | Discharge: 2022-06-12 | Disposition: A | Discharge status: Home / Self Care | Payer: BC Managed Care – PPO | Source: Ambulatory Visit | Attending: Podiatry | Admitting: Podiatry

## 2022-06-12 DIAGNOSIS — S93691A Other sprain of right foot, initial encounter: Secondary | ICD-10-CM

## 2022-06-15 IMAGING — MG MM DIGITAL DIAGNOSTIC UNILAT*L* W/ TOMO W/ CAD
4 series · 4 of 12 positions shown · non-contrast
Comparison: Previous exam(s).

CLINICAL DATA: 54-year-old female presenting as a recall from
screening for possible left breast asymmetry.

EXAM:
DIGITAL DIAGNOSTIC UNILATERAL LEFT MAMMOGRAM WITH TOMO AND CAD

[L ML synth-2D]
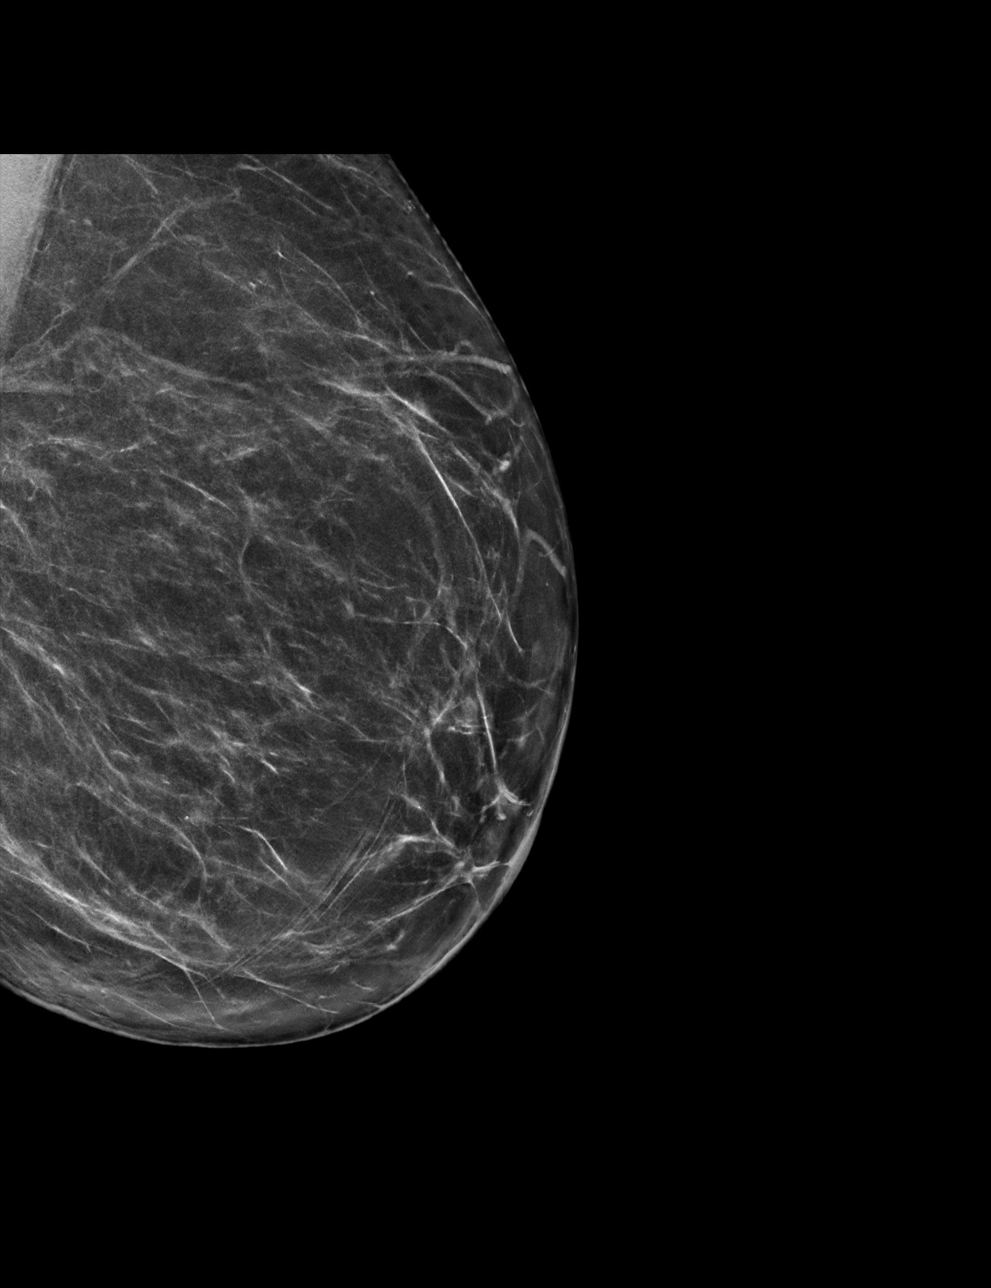

[L MLO synth-2D]
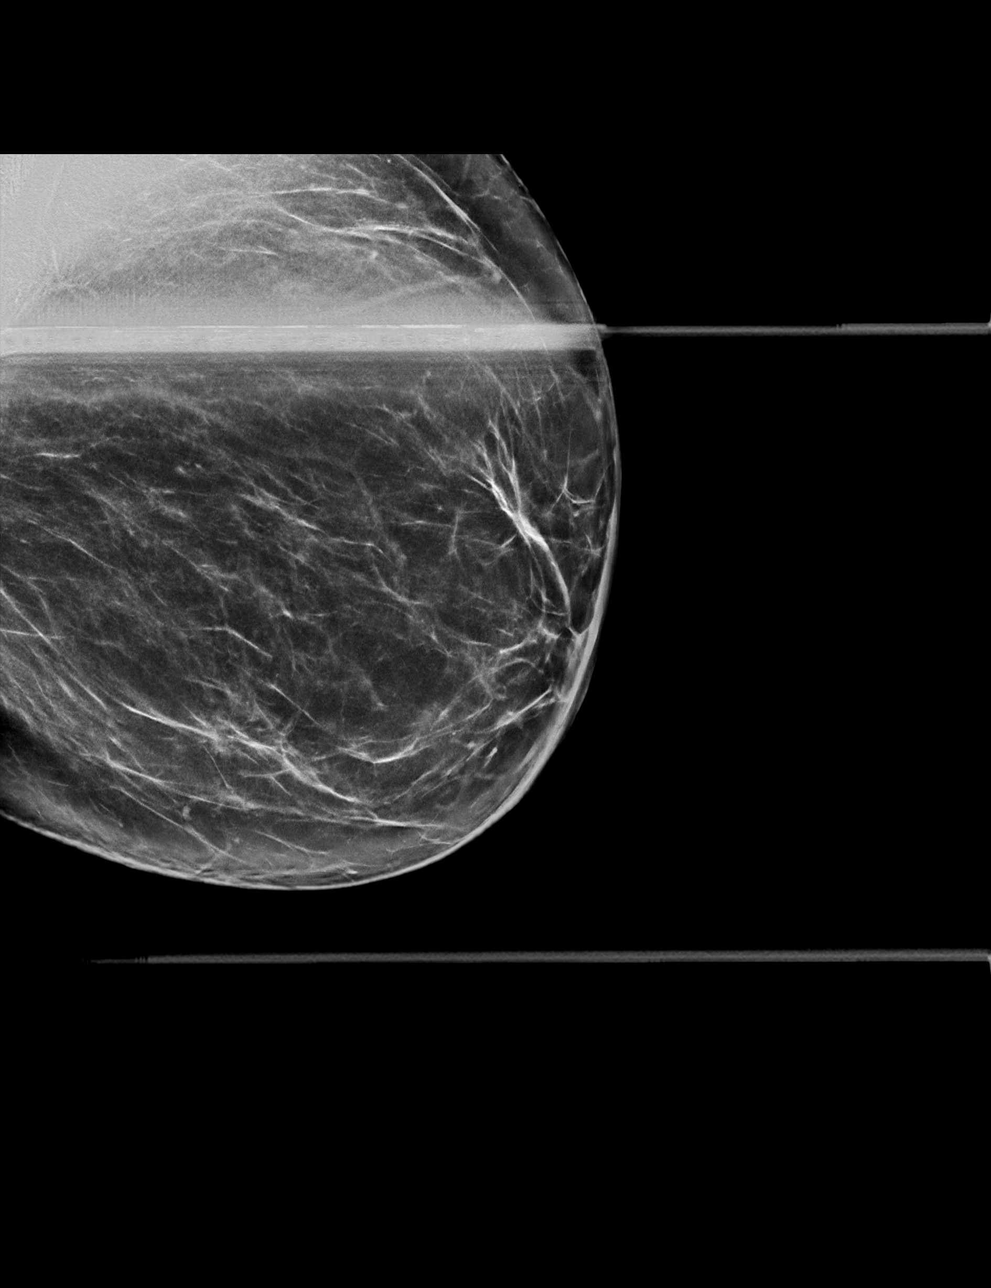

[L MLO tomo · tomo slice 35/70.0]
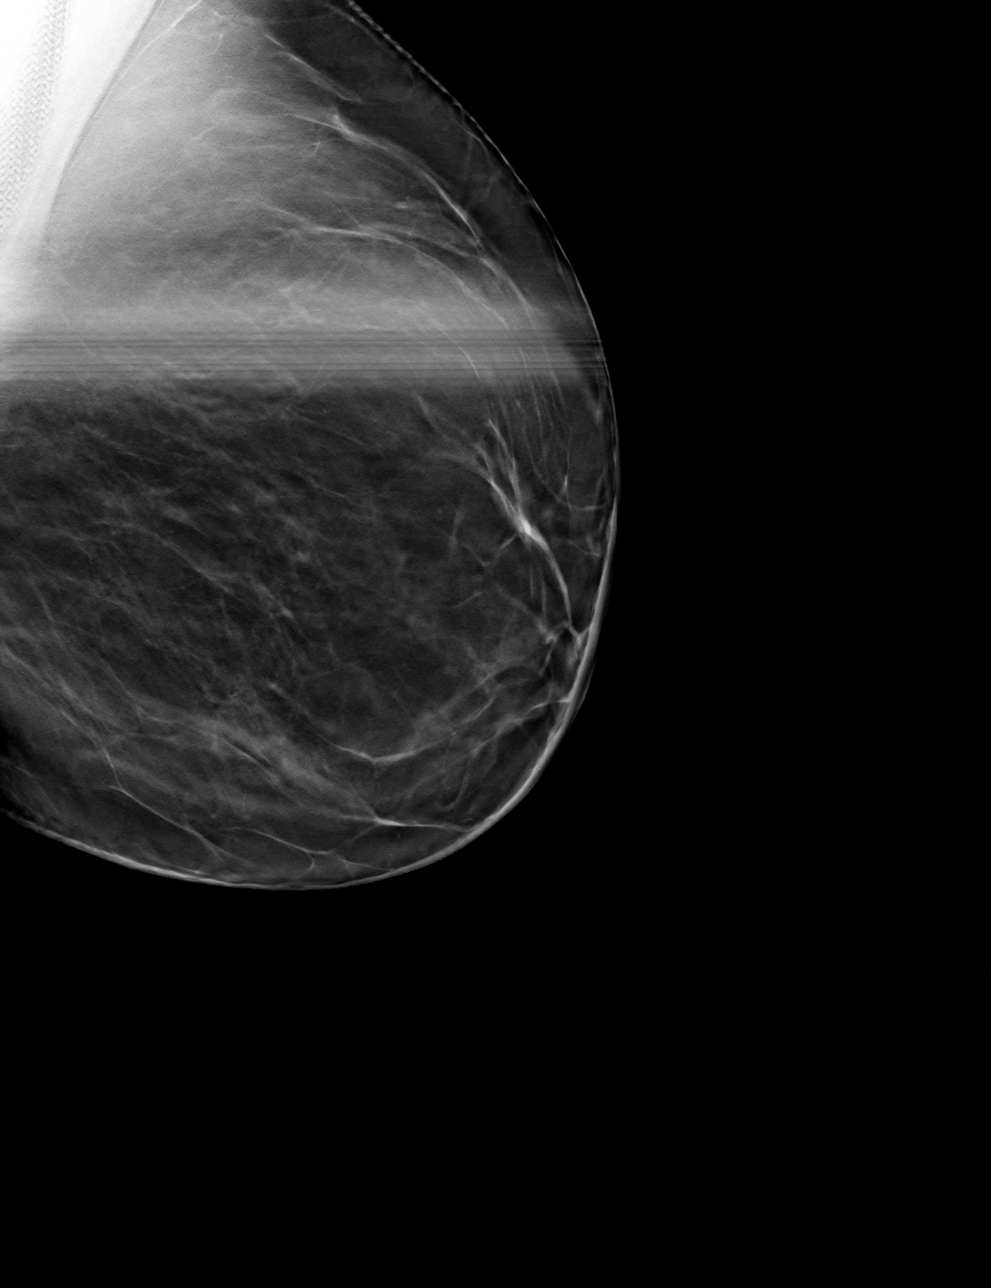

[L ML tomo · tomo slice 33/65.0]
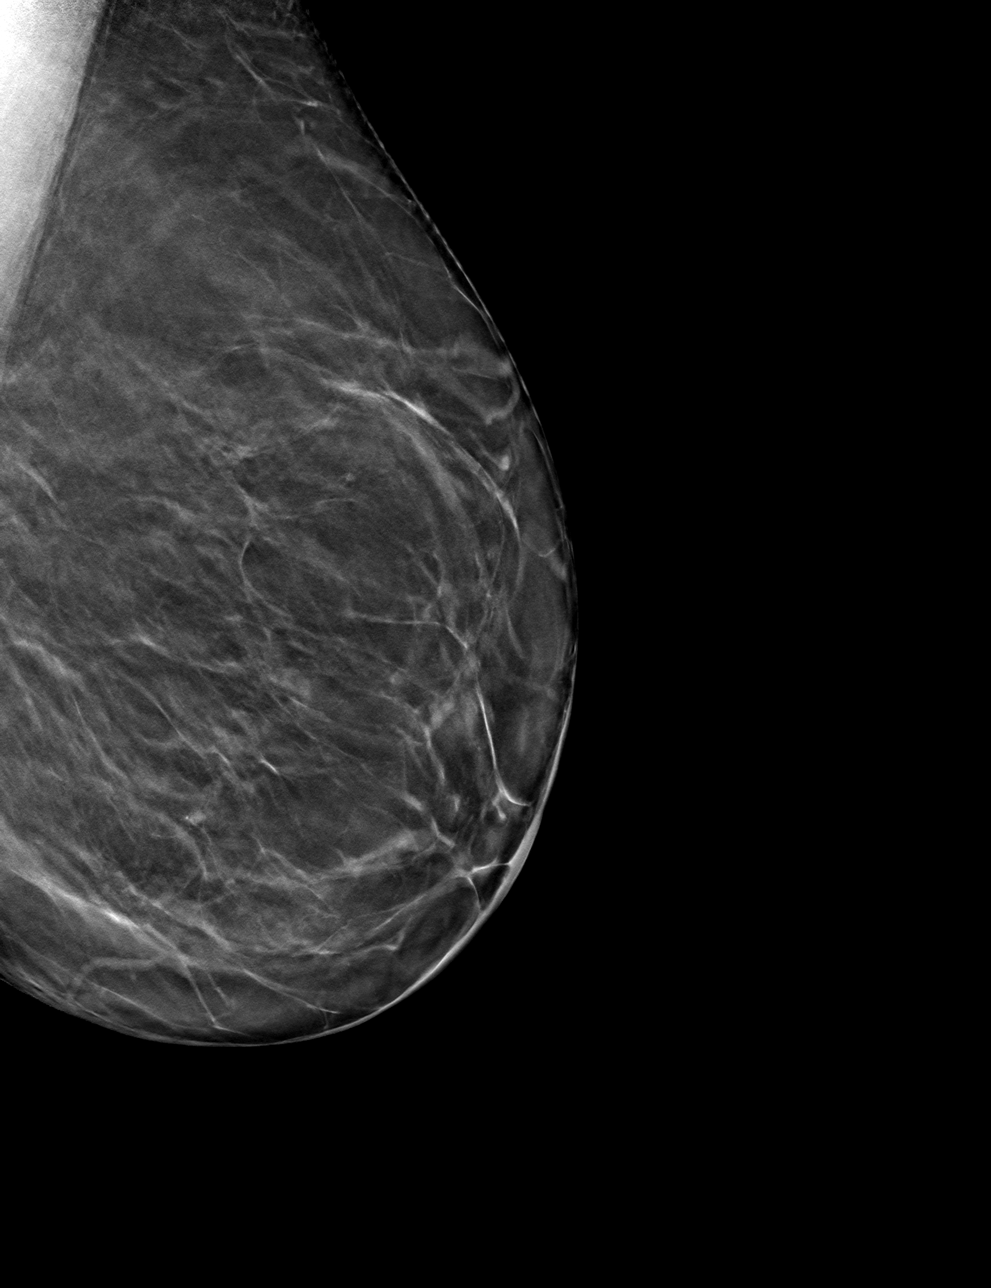

[4 of 12 positions shown; findings below may reference images not displayed]

ACR Breast Density Category b: There are scattered areas of
fibroglandular density.
FINDINGS: Spot compression and full field mL tomosynthesis views of the left
breast performed to evaluate the questioned asymmetry seen only on
the MLO view in the inferior left breast. On the additional imaging
the tissue in this area disperses without persistent asymmetry, mass
or distortion. There are no new findings in the left breast.

Mammographic images were processed with CAD.
IMPRESSION: Resolution of the asymmetry seen on screening mammogram in the
inferior left breast. No mammographic evidence of malignancy.

RECOMMENDATION:
Screening mammogram in one year.(Code:[id number])

I have discussed the findings and recommendations with the patient.
If applicable, a reminder letter will be sent to the patient
regarding the next appointment.

BI-RADS CATEGORY  1: Negative.

## 2022-06-27 ENCOUNTER — Telehealth: Payer: Self-pay | Admitting: *Deleted

## 2022-06-27 NOTE — Telephone Encounter (Signed)
Patient is calling for the MRI results from 06/06/22. She is still having problems with numbness in foot and toes,concerned because having more pain as well.

## 2022-06-27 NOTE — Telephone Encounter (Signed)
Most likely a surgical fix. Have her in for consult appt

## 2022-06-27 NOTE — Telephone Encounter (Signed)
Please schedule for consult.

## 2022-06-28 ENCOUNTER — Telehealth: Payer: Self-pay | Admitting: *Deleted

## 2022-06-28 NOTE — Telephone Encounter (Signed)
Patient returning a call back, transferred to scheduling.

## 2022-07-15 ENCOUNTER — Ambulatory Visit: Payer: BC Managed Care – PPO | Admitting: Podiatry

## 2022-07-15 ENCOUNTER — Encounter: Payer: Self-pay | Admitting: Podiatry

## 2022-07-15 DIAGNOSIS — M722 Plantar fascial fibromatosis: Secondary | ICD-10-CM

## 2022-07-15 NOTE — Progress Notes (Signed)
She presents today for follow-up of her plantar fascial pain.  States that she is still experiencing some pain in the right heel.  She is also experiencing some numbness plantar foot and dorsum of the foot particular around the saphenous distribution and the medial dorsal cutaneous distribution.  She does relate some hip and knee pain as well.  Objective: Vital signs are stable alert oriented x3 reviewed radiographs and MRI readings with her and consisting of Planter fasciitis.  No tears are identified per MRI.  Possible osteochondral defect.  Assessment: We will send her to physical therapy at [facility] to correlate with her knee and her hip physical therapy as well.  May need to consider neuro eval if this continues.

## 2022-07-17 ENCOUNTER — Telehealth: Payer: Self-pay | Admitting: *Deleted

## 2022-07-17 NOTE — Telephone Encounter (Signed)
-----   Message from Rip Harbour, [facility] sent at 07/15/2022  1:43 PM EDT ----- Regarding: PT I put in an external order for PT at [facility]. She is already seen there for her hip and knee. Can you just fax that order for her foot to them? Thank you!

## 2022-07-17 NOTE — Telephone Encounter (Signed)
Faxed referral to: Raliegh Ip ([place])PT,confirmation received 07/17/22

## 2022-07-24 DIAGNOSIS — R590 Localized enlarged lymph nodes: Secondary | ICD-10-CM | POA: Insufficient documentation

## 2022-08-22 ENCOUNTER — Ambulatory Visit: Payer: BC Managed Care – PPO | Admitting: Internal Medicine

## 2022-08-27 ENCOUNTER — Ambulatory Visit: Payer: BC Managed Care – PPO | Admitting: Internal Medicine

## 2022-09-02 ENCOUNTER — Ambulatory Visit: Payer: BC Managed Care – PPO | Admitting: Nurse Practitioner

## 2022-09-02 ENCOUNTER — Encounter: Payer: Self-pay | Admitting: Nurse Practitioner

## 2022-09-02 VITALS — BP 118/66 | HR 76 | Temp 98.4°F | Ht 64.0 in | Wt 222.0 lb

## 2022-09-02 DIAGNOSIS — N289 Disorder of kidney and ureter, unspecified: Secondary | ICD-10-CM

## 2022-09-02 DIAGNOSIS — E890 Postprocedural hypothyroidism: Secondary | ICD-10-CM

## 2022-09-02 DIAGNOSIS — J060 Acute laryngopharyngitis: Secondary | ICD-10-CM

## 2022-09-02 DIAGNOSIS — R7303 Prediabetes: Secondary | ICD-10-CM | POA: Diagnosis not present

## 2022-09-02 DIAGNOSIS — R040 Epistaxis: Secondary | ICD-10-CM

## 2022-09-02 DIAGNOSIS — E7800 Pure hypercholesterolemia, unspecified: Secondary | ICD-10-CM

## 2022-09-02 DIAGNOSIS — I10 Essential (primary) hypertension: Secondary | ICD-10-CM

## 2022-09-02 DIAGNOSIS — R590 Localized enlarged lymph nodes: Secondary | ICD-10-CM

## 2022-09-02 MED ORDER — DOXYCYCLINE MONOHYDRATE 100 MG PO CAPS: 100.0000 mg | ORAL_CAPSULE | Freq: Two times a day (BID) | ORAL | 0 refills | Status: DC

## 2022-09-02 NOTE — Patient Instructions (Signed)
Sore Throat When you have a sore throat, your throat may feel: Tender. Burning. Irritated. Scratchy. Painful when you swallow. Painful when you talk. Many things can cause a sore throat, such as: An infection. Allergies. Dry air. Smoke or pollution. Radiation treatment for cancer. Gastroesophageal reflux disease (GERD). A tumor. A sore throat can be the first sign of another sickness. It can happen with other problems, like: Coughing. Sneezing. Fever. Swelling of the glands in the neck. Most sore throats go away without treatment. Follow these instructions at home:     Medicines Take over-the-counter and prescription medicines only as told by your doctor. Children often get sore throats. Do not give your child aspirin. Use throat sprays to soothe your throat as told by your health care provider. Managing pain To help with pain: Sip warm liquids, such as broth, herbal tea, or warm water. Eat or drink cold or frozen liquids, such as frozen ice pops. Rinse your mouth (gargle) with a salt water mixture 3-4 times a day or as needed. To make salt water, dissolve -1 tsp (3-6 g) of salt in 1 cup (237 mL) of warm water. Do not swallow this mixture. Suck on hard candy or throat lozenges. Put a cool-mist humidifier in your bedroom at night. Sit in the bathroom with the door closed for 5-10 minutes while you run hot water in the shower. General instructions Do not smoke or use any products that contain nicotine or tobacco. If you need help quitting, ask your doctor. Get plenty of rest. Drink enough fluid to keep your pee (urine) pale yellow. Wash your hands often for at least 20 seconds with soap and water. If soap and water are not available, use hand sanitizer. Contact a doctor if: You have a fever for more than 2-3 days. You keep having symptoms for more than 2-3 days. Your throat does not get better in 7 days. You have a fever and your symptoms suddenly get worse. Your  child who is 3 months to 3 years old has a temperature of 102.2F (39C) or higher. Get help right away if: You have trouble breathing. You cannot swallow fluids, soft foods, or your spit. You have swelling in your throat or neck that gets worse. You feel like you may vomit (nauseous) and this feeling lasts a long time. You cannot stop vomiting. These symptoms may be an emergency. Get help right away. Call your local emergency services (911 in the [place] Do not wait to see if the symptoms will go away. Do not drive yourself to the hospital. Summary A sore throat is a painful, burning, irritated, or scratchy throat. Many things can cause a sore throat. Take over-the-counter medicines only as told by your doctor. Get plenty of rest. Drink enough fluid to keep your pee (urine) pale yellow. Contact a doctor if your symptoms get worse or your sore throat does not get better within 7 days. This information is not intended to replace advice given to you by your health care provider. Make sure you discuss any questions you have with your health care provider. Document Revised: 01/10/2021 Document Reviewed: 01/10/2021 Elsevier Patient Education  2023 Elsevier Inc.  

## 2022-09-02 NOTE — Progress Notes (Signed)
I,Tianna Badgett,acting as a Education administrator for [employer], FNP.,have documented all relevant documentation on the behalf of Minette Brine, FNP,as directed by  Minette Brine, FNP while in the presence of Minette Brine, [place].  Subjective:     Patient ID: Dana Vasquez , female    DOB: 11/27/1965 , 56 y.o.   MRN: [id number]   Chief Complaint  Patient presents with   Sore Throat    HPI  Patient presents today for sore throat. She is supposed to see the ENT later this week. A few years ago she had the same thing and was diagnosed with laryngitis or URI. She has been to urgent care at least 4 times this year. Her test are always negative. She is coughing up yellow phlegm and they will give her antibiotics. She has also had autoimmune disease. She has drainage to the back of her throat. She drinks about 4-5 glasses of water a day. She is concerned about Sgrogen's.  When her phlegm starts it is clear and once turns yellow stays that way. She has used inhalers for asthma. She is not using any inhalers regularly. Usually occur with colds. She has had covid 3 times. Feels like her dryness to her throat goes hand in hand with the laryngitis. She had antibiotics one month ago.   She does go to Orthopedics for her arthritis symptoms.   She has stopped taking her atorvastatin and her kidney functions has improved, stopped completely on her own.   Sore Throat  This is a recurrent problem.     Past Medical History:  Diagnosis Date   Allergy    Anemia    Arthritis    knees  AND SHOULDERS   Asthma    allergy related - rarely uses inhaler   Bruises easily    Bunion    Cancer ([place])    1995;thyroid cancer, SURGERY AND RADIOACTIVE IODINE   Ectopic pregnancy    Euthyroid    Fatigue    Fibromyalgia    GERD (gastroesophageal reflux disease)    Headache(784.0)    otc med prn - last one 02/2014   History of blood transfusion 2000   In [place], [place] at [place] - ? 4 units transfused   Hypothyroidism    Knee  pain    Plantar fasciitis    Sleep apnea    uses CPAP   SVD (spontaneous vaginal delivery)    x 3   Thyroid disease    thyroidectomy 1995     Family History  Problem Relation Age of Onset   Hypertension Mother    Cancer Mother        uterine   Cancer Father    Cancer Brother        stomach; 64   Early death Brother    Stroke Maternal Uncle    Heart disease Maternal Uncle    Food Allergy Daughter    Allergic rhinitis Daughter    Allergic rhinitis Daughter    Angioedema Neg Hx    Asthma Neg Hx    Eczema Neg Hx    Immunodeficiency Neg Hx    Urticaria Neg Hx    Atopy Neg Hx      Current Outpatient Medications:    doxycycline (MONODOX) 100 MG capsule, Take 1 capsule (100 mg total) by mouth 2 (two) times daily., Disp: 14 capsule, Rfl: 0   albuterol (VENTOLIN HFA) 108 (90 Base) MCG/ACT inhaler, INHALE 2 PUFFS INTO THE LUNGS EVERY 6 HOURS AS NEEDED FOR  WHEEZING OR SHORTNESS OF BREATH, Disp: 54 g, Rfl: 3   cetirizine (ZYRTEC) 10 MG tablet, Take 1 tablet (10 mg total) by mouth daily., Disp: 90 tablet, Rfl: 2   EPINEPHrine (AUVI-Q) 0.3 mg/0.3 mL IJ SOAJ injection, Inject 0.3 mg into the muscle as needed for anaphylaxis. As directed for life-threatening allergic reactions, Disp: 2 each, Rfl: 2   levothyroxine (SYNTHROID) 112 MCG tablet, TAKE 1 TABLET(112 MCG) BY MOUTH DAILY, Disp: 90 tablet, Rfl: 1   Allergies  Allergen Reactions   Amoxicillin Other (See Comments)    Headache Has patient had a PCN reaction causing immediate rash, facial/tongue/throat swelling, SOB or lightheadedness with hypotension: No Has patient had a PCN reaction causing severe rash involving mucus membranes or skin necrosis: No Has patient had a PCN reaction that required hospitalization: No Has patient had a PCN reaction occurring within the last 10 years: Unknown If all of the above answers are "NO", then may proceed with Cephalosporin use.    Nitrofurantoin Monohyd Macro Other (See Comments)    Pt  stated she gets a bad yeast infection   Dexilant [Dexlansoprazole]     "bad stomachache"   Meloxicam     Upset stomach and stomach pains   Budesonide Rash   Formoterol Rash   Formoterol Fumarate Rash     Review of Systems  Constitutional: Negative.   Respiratory: Negative.    Cardiovascular: Negative.   Gastrointestinal: Negative.   Neurological: Negative.   Psychiatric/Behavioral: Negative.       Today's Vitals   09/02/22 1447  BP: 118/66  Pulse: 76  Temp: 98.4 F (36.9 C)  TempSrc: Oral  Weight: 222 lb (100.7 kg)  Height: 5' 4" (1.626 m)   Body mass index is 38.11 kg/m.   Objective:  Physical Exam Vitals reviewed.  Constitutional:      General: She is not in acute distress.    Appearance: She is well-developed. She is obese.  Cardiovascular:     Rate and Rhythm: Normal rate and regular rhythm.     Heart sounds: Normal heart sounds. No murmur heard. Pulmonary:     Effort: Pulmonary effort is normal.     Breath sounds: Normal breath sounds.  Skin:    General: Skin is warm and dry.  Neurological:     Mental Status: She is alert and oriented to person, place, and time.  Psychiatric:        Mood and Affect: Mood normal.        Behavior: Behavior normal.         Assessment And Plan:     1. Sore throat and laryngitis Comments: She has seen ENT last week. Symptoms have been persistent, will treat with antibiotics. Also advised to use her inhalers more regularly for asthma - Sjogren's syndrome antibods(ssa + ssb) - doxycycline (MONODOX) 100 MG capsule; Take 1 capsule (100 mg total) by mouth 2 (two) times daily.  Dispense: 14 capsule; Refill: 0  2. Renal insufficiency Comments: Will check levels, she has stopped taking her atorvastatin and says her kidney functions improved. - Multiple Myeloma Panel (SPEP&IFE w/QIG) - BMP8+eGFR  3. Pure hypercholesterolemia Comments: No current medications due to side effects per patient. Encouraged to eat low fat diet -  Lipid panel - BMP8+eGFR  4. Prediabetes Comments: Diet controlled, continue focusing on healthy diet and increasing physical activity - Hemoglobin A1c  5. Postoperative hypothyroidism Comments: Will check thyroid levels. - TSH + free T4  6. Well-controlled hypertension Comments: Blood pressure is  well controlled, continue current medications - BMP8+eGFR  7. Lymphadenopathy, cervical Comments: Firm node to posterior cervical chain left and left submandibular - US Soft Tissue Head/Neck (NON-THYROID); Future     Patient was given opportunity to ask questions. Patient verbalized understanding of the plan and was able to repeat key elements of the plan. All questions were answered to their satisfaction.  Minette Brine, FNP    I, Minette Brine, FNP, have reviewed all documentation for this visit. The documentation on 09/02/22 for the exam, diagnosis, procedures, and orders are all accurate and complete.  IF YOU HAVE BEEN REFERRED TO A SPECIALIST, IT MAY TAKE 1-2 WEEKS TO SCHEDULE/PROCESS THE REFERRAL. IF YOU HAVE NOT HEARD FROM US/SPECIALIST IN TWO WEEKS, PLEASE GIVE [place] A CALL AT [phone].   THE PATIENT IS ENCOURAGED TO PRACTICE SOCIAL DISTANCING DUE TO THE COVID-19 PANDEMIC.

## 2022-09-04 ENCOUNTER — Ambulatory Visit
Admission: RE | Admit: 2022-09-04 | Discharge: 2022-09-04 | Disposition: A | Discharge status: Home / Self Care | Payer: BC Managed Care – PPO | Source: Ambulatory Visit | Attending: Nurse Practitioner | Admitting: Nurse Practitioner

## 2022-09-04 ENCOUNTER — Other Ambulatory Visit: Payer: BC Managed Care – PPO

## 2022-09-04 DIAGNOSIS — R590 Localized enlarged lymph nodes: Secondary | ICD-10-CM

## 2022-09-04 LAB — BMP8+EGFR
BUN/Creatinine Ratio: 16 (ref 9–23)
BUN: 15 mg/dL (ref 6–24)
CO2: 24 mmol/L (ref 20–29)
Calcium: 9.7 mg/dL (ref 8.7–10.2)
Chloride: 102 mmol/L (ref 96–106)
Creatinine, Ser: 0.91 mg/dL (ref 0.57–1.00)
Glucose: 131 mg/dL — ABNORMAL HIGH (ref 70–99)
Potassium: 4.3 mmol/L (ref 3.5–5.2)
Sodium: 141 mmol/L (ref 134–144)
eGFR: 75 mL/min/{1.73_m2} (ref >59)

## 2022-09-04 LAB — MULTIPLE MYELOMA PANEL, SERUM
Albumin SerPl Elph-Mcnc: 3.8 g/dL (ref 2.9–4.4)
Albumin/Glob SerPl: 1.1 (ref 0.7–1.7)
Alpha 1: 0.3 g/dL (ref 0.0–0.4)
Alpha2 Glob SerPl Elph-Mcnc: 0.8 g/dL (ref 0.4–1.0)
B-Globulin SerPl Elph-Mcnc: 1.1 g/dL (ref 0.7–1.3)
Gamma Glob SerPl Elph-Mcnc: 1.7 g/dL (ref 0.4–1.8)
Globulin, Total: 3.8 g/dL (ref 2.2–3.9)
IgA/Immunoglobulin A, Serum: 230 mg/dL (ref 87–352)
IgG (Immunoglobin G), Serum: 2000 mg/dL — ABNORMAL HIGH (ref 586–1602)
IgM (Immunoglobulin M), Srm: 33 mg/dL (ref 26–217)
M Protein SerPl Elph-Mcnc: 1 g/dL — ABNORMAL HIGH
Total Protein: 7.6 g/dL (ref 6.0–8.5)

## 2022-09-04 LAB — LIPID PANEL
Chol/HDL Ratio: 3.4 ratio (ref 0.0–4.4)
Cholesterol, Total: 243 mg/dL — ABNORMAL HIGH (ref 100–199)
HDL: 72 mg/dL (ref >39)
LDL Chol Calc (NIH): 159 mg/dL — ABNORMAL HIGH (ref 0–99)
Triglycerides: 72 mg/dL (ref 0–149)
VLDL Cholesterol Cal: 12 mg/dL (ref 5–40)

## 2022-09-04 LAB — HEMOGLOBIN A1C
Est. average glucose Bld gHb Est-mCnc: 120 mg/dL
Hgb A1c MFr Bld: 5.8 % — ABNORMAL HIGH (ref 4.8–5.6)

## 2022-09-04 LAB — TSH+FREE T4
Free T4: 1.55 ng/dL (ref 0.82–1.77)
TSH: 1.81 u[IU]/mL (ref 0.450–4.500)

## 2022-09-04 LAB — SJOGREN'S SYNDROME ANTIBODS(SSA + SSB)
ENA SSA (RO) Ab: <0.2 AI (ref 0.0–0.9)
ENA SSB (LA) Ab: <0.2 AI (ref 0.0–0.9)

## 2022-09-05 ENCOUNTER — Encounter: Payer: Self-pay | Admitting: Internal Medicine

## 2022-09-09 ENCOUNTER — Other Ambulatory Visit: Payer: BC Managed Care – PPO

## 2022-09-11 ENCOUNTER — Other Ambulatory Visit: Payer: Self-pay | Admitting: Nurse Practitioner

## 2022-09-11 DIAGNOSIS — R799 Abnormal finding of blood chemistry, unspecified: Secondary | ICD-10-CM

## 2022-09-11 DIAGNOSIS — R591 Generalized enlarged lymph nodes: Secondary | ICD-10-CM

## 2022-09-12 ENCOUNTER — Telehealth: Payer: Self-pay | Admitting: Hematology and Oncology

## 2022-09-12 ENCOUNTER — Other Ambulatory Visit: Payer: Self-pay | Admitting: Nurse Practitioner

## 2022-09-12 ENCOUNTER — Telehealth: Payer: Self-pay | Admitting: Nurse Practitioner

## 2022-09-12 DIAGNOSIS — R591 Generalized enlarged lymph nodes: Secondary | ICD-10-CM

## 2022-09-12 NOTE — Telephone Encounter (Signed)
Spoke with patient confirming new patient appointments 12/6

## 2022-09-12 NOTE — Telephone Encounter (Signed)
Returned call to answer questions about her most recent labs and

## 2022-10-02 ENCOUNTER — Inpatient Hospital Stay: Payer: BC Managed Care – PPO

## 2022-10-02 ENCOUNTER — Inpatient Hospital Stay: Payer: BC Managed Care – PPO | Attending: Hematology and Oncology | Admitting: Hematology and Oncology

## 2022-10-02 VITALS — BP 152/77 | HR 68 | Temp 97.3°F | Resp 14 | Wt 223.7 lb

## 2022-10-02 DIAGNOSIS — R599 Enlarged lymph nodes, unspecified: Secondary | ICD-10-CM | POA: Insufficient documentation

## 2022-10-02 DIAGNOSIS — Z8042 Family history of malignant neoplasm of prostate: Secondary | ICD-10-CM | POA: Diagnosis not present

## 2022-10-02 DIAGNOSIS — D472 Monoclonal gammopathy: Secondary | ICD-10-CM

## 2022-10-02 DIAGNOSIS — Z79899 Other long term (current) drug therapy: Secondary | ICD-10-CM | POA: Diagnosis not present

## 2022-10-02 DIAGNOSIS — Z85850 Personal history of malignant neoplasm of thyroid: Secondary | ICD-10-CM | POA: Insufficient documentation

## 2022-10-02 DIAGNOSIS — Z8616 Personal history of COVID-19: Secondary | ICD-10-CM | POA: Diagnosis not present

## 2022-10-02 DIAGNOSIS — Z808 Family history of malignant neoplasm of other organs or systems: Secondary | ICD-10-CM | POA: Insufficient documentation

## 2022-10-02 DIAGNOSIS — E039 Hypothyroidism, unspecified: Secondary | ICD-10-CM | POA: Insufficient documentation

## 2022-10-02 DIAGNOSIS — Z800 Family history of malignant neoplasm of digestive organs: Secondary | ICD-10-CM | POA: Insufficient documentation

## 2022-10-02 DIAGNOSIS — K219 Gastro-esophageal reflux disease without esophagitis: Secondary | ICD-10-CM | POA: Insufficient documentation

## 2022-10-02 LAB — CMP (CANCER CENTER ONLY)
ALT: 15 U/L (ref 0–44)
AST: 14 U/L — ABNORMAL LOW (ref 15–41)
Albumin: 4.3 g/dL (ref 3.5–5.0)
Alkaline Phosphatase: 79 U/L (ref 38–126)
Anion gap: 8 (ref 5–15)
BUN: 16 mg/dL (ref 6–20)
CO2: 28 mmol/L (ref 22–32)
Calcium: 9.4 mg/dL (ref 8.9–10.3)
Chloride: 105 mmol/L (ref 98–111)
Creatinine: 1.04 mg/dL — ABNORMAL HIGH (ref 0.44–1.00)
GFR, Estimated: >60 mL/min (ref >60)
Glucose, Bld: 92 mg/dL (ref 70–99)
Potassium: 3.8 mmol/L (ref 3.5–5.1)
Sodium: 141 mmol/L (ref 135–145)
Total Bilirubin: 0.4 mg/dL (ref 0.3–1.2)
Total Protein: 9.1 g/dL — ABNORMAL HIGH (ref 6.5–8.1)

## 2022-10-02 LAB — CBC WITH DIFFERENTIAL (CANCER CENTER ONLY)
Abs Immature Granulocytes: 0.04 10*3/uL (ref 0.00–0.07)
Basophils Absolute: 0 10*3/uL (ref 0.0–0.1)
Basophils Relative: 0 %
Eosinophils Absolute: 0.1 10*3/uL (ref 0.0–0.5)
Eosinophils Relative: 1 %
HCT: 38.9 % (ref 36.0–46.0)
Hemoglobin: 12.8 g/dL (ref 12.0–15.0)
Immature Granulocytes: 0 %
Lymphocytes Relative: 27 %
Lymphs Abs: 2.7 10*3/uL (ref 0.7–4.0)
MCH: 26.1 pg (ref 26.0–34.0)
MCHC: 32.9 g/dL (ref 30.0–36.0)
MCV: 79.2 fL — ABNORMAL LOW (ref 80.0–100.0)
Monocytes Absolute: 0.7 10*3/uL (ref 0.1–1.0)
Monocytes Relative: 7 %
Neutro Abs: 6.5 10*3/uL (ref 1.7–7.7)
Neutrophils Relative %: 65 %
Platelet Count: 340 10*3/uL (ref 150–400)
RBC: 4.91 MIL/uL (ref 3.87–5.11)
RDW: 15 % (ref 11.5–15.5)
WBC Count: 10 10*3/uL (ref 4.0–10.5)
nRBC: 0 % (ref 0.0–0.2)

## 2022-10-02 LAB — C-REACTIVE PROTEIN: CRP: 1.5 mg/dL — ABNORMAL HIGH (ref <1.0)

## 2022-10-02 LAB — LACTATE DEHYDROGENASE: LDH: 158 U/L (ref 98–192)

## 2022-10-02 LAB — SEDIMENTATION RATE: Sed Rate: 22 mm/hr (ref 0–22)

## 2022-10-02 NOTE — Progress Notes (Signed)
[facility] [phone]   [phone]  INITIAL CONSULT NOTE  Patient Care Team: Sanders, Robyn, MD as PCP - General (Internal Medicine) Jordan, Peter M, MD as PCP - Cardiology (Cardiology)  Hematological/Oncological History # IgG Kappa Monoclonal Gammopathy  09/02/2022: SPEP showed M protein 1.0, IgG kappa specificity 09/04/2022: US soft tissue/head/neck showed physiologic lymph nodes identified by ultrasound in the palpable areas of concern. 10/02/2022: establish care with Dr. Dorsey   CHIEF COMPLAINTS/PURPOSE OF CONSULTATION:  "IgG Kappa Monoclonal Gammopathy  "  HISTORY OF PRESENTING ILLNESS:  Dana Vasquez 56 y.o. female with medical history significant for thyroid cancer status post surgery and radioactive iodine, fibromyalgia, hypothyroidism, plantar fasciitis, and GERD who presents for evaluation of a monoclonal gammopathy and prominent cervical lymph nodes.  On review of the previous records Dana Vasquez underwent an SPEP on 09/02/2022 which showed an M protein of 1.0 with IgG kappa specificity.  She also underwent a an ultrasound of her head and neck on 09/04/2022 which showed physiologic lymph nodes identified by ultrasound and palpable areas of concern.  Due to concern for the prominent lymph nodes and elevated M protein the patient was referred to hematology for further evaluation and management.  On exam today Dana Vasquez is accompanied by her husband.  She reports that she has lymphadenopathy in the neck which "comes and goes".  She reports that sometimes the lymph node gets agitated and can be sore and prominent and then receded shortly thereafter.  She reports that she also has some difficulty with cough, productive sputum, and occasional sore throats.  She notes that she is undergone autoimmune testing but no clear etiology has been found.  She reports that she has gone to urgent care before for these issues but no etiology has ever been  uncovered.  She reports that she had had problems with her throat since childhood.  She also notes that she underwent treatment for thyroid cancer in the past.  She also had a terrible case of COVID back in early 2020.  On further discussion she reports that her mother had uterine cancer, father had prostate cancer, and her brother had gastric cancer.  She personally had thyroid cancer in 1995.  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  She notes that she is not having any issues with fevers, chills, sweats, nausea, vomiting or diarrhea.  She does currently have a lymph node inflamed on the right lower cervical chain.  A full 10 point ROS was otherwise negative.  MEDICAL HISTORY:  Past Medical History:  Diagnosis Date   Allergy    Anemia    Arthritis    knees  AND SHOULDERS   Asthma    allergy related - rarely uses inhaler   Bruises easily    Bunion    Cancer (HCC)    1995;thyroid cancer, SURGERY AND RADIOACTIVE IODINE   Ectopic pregnancy    Euthyroid    Fatigue    Fibromyalgia    GERD (gastroesophageal reflux disease)    Headache(784.0)    otc med prn - last one 02/2014   History of blood transfusion 2000   In [place], [place] [facility] [place] - ? 4 units transfused   Hypothyroidism    Knee pain    Plantar fasciitis    Sleep apnea    uses CPAP   SVD (spontaneous vaginal delivery)    x 3     Thyroid disease    thyroidectomy 1995    SURGICAL HISTORY: Past Surgical History:  Procedure Laterality Date   ABDOMINAL HYSTERECTOMY N/A 04/20/2014   Procedure: Total ABDOMINAL HYSTERECTOMY partial right salpingectomy;  Surgeon: [name] Haygood, MD;  Location: [facility];  Service: Gynecology;  Laterality: N/A;   BREATH TEK H PYLORI  09/18/2011   Procedure: BREATH TEK H PYLORI;  Surgeon: Matthew B Martin, MD;  Location: [facility];  Service: General;  Laterality: N/A;   BUNIONECTOMY     left;2015   BUNIONECTOMY      COLONOSCOPY     DILATION AND CURETTAGE OF UTERUS  1988   endometriosis   ECTOPIC PREGNANCY SURGERY     fallopian tubes removed     left tube removed per patient - laparotomy   FOOT SURGERY Right 2019   FOOT SURGERY Bilateral 2021   LAPAROSCOPIC GASTRIC SLEEVE RESECTION N/A 04/14/2018   Procedure: LAPAROSCOPIC GASTRIC SLEEVE RESECTION WITH UPPER ENDO AND ERAS PATHWAY;  Surgeon: Martin, Matthew, MD;  Location: [facility];  Service: General;  Laterality: N/A;   LYSIS OF ADHESION N/A 04/20/2014   Procedure: LYSIS OF ADHESION;  Surgeon: [name] Haygood, MD;  Location: [facility];  Service: Gynecology;  Laterality: N/A;   THYROIDECTOMY  1995   TUBAL LIGATION     WISDOM TOOTH EXTRACTION      SOCIAL HISTORY: Social History   Socioeconomic History   Marital status: Married    Spouse name: Not on file   Number of children: 3   Years of education: Not on file   Highest education level: Not on file  Occupational History   Not on file  Tobacco Use   Smoking status: Never   Smokeless tobacco: Never  Vaping Use   Vaping Use: Never used  Substance and Sexual Activity   Alcohol use: Yes    Alcohol/week: 0.0 standard drinks of alcohol    Comment: occ   Drug use: No   Sexual activity: Yes    Birth control/protection: Surgical  Other Topics Concern   Not on file  Social History Narrative   Not on file   Social Determinants of Health   Financial Resource Strain: Not on file  Food Insecurity: Not on file  Transportation Needs: Not on file  Physical Activity: Not on file  Stress: Not on file  Social Connections: Not on file  Intimate Partner Violence: Not on file    FAMILY HISTORY: Family History  Problem Relation Age of Onset   Hypertension Mother    Cancer Mother        uterine   Cancer Father    Cancer Brother        stomach; 40   Early death Brother    Stroke Maternal Uncle    Heart disease Maternal Uncle    Food Allergy Daughter    Allergic rhinitis Daughter    Allergic  rhinitis Daughter    Angioedema Neg Hx    Asthma Neg Hx    Eczema Neg Hx    Immunodeficiency Neg Hx    Urticaria Neg Hx    Atopy Neg Hx     ALLERGIES:  is allergic to amoxicillin, nitrofurantoin monohyd macro, dexilant [dexlansoprazole], meloxicam, budesonide, formoterol, and formoterol fumarate.  MEDICATIONS:  Current Outpatient Medications  Medication Sig Dispense Refill   albuterol (VENTOLIN HFA) 108 (90 Base) MCG/ACT inhaler INHALE 2 PUFFS INTO THE LUNGS EVERY 6 HOURS AS NEEDED FOR WHEEZING OR SHORTNESS OF BREATH 54 g 3   atorvastatin (  LIPITOR) 10 MG tablet Take 1 tablet by mouth daily.     cetirizine (ZYRTEC) 10 MG tablet Take 1 tablet (10 mg total) by mouth daily. 90 tablet 2   EPINEPHrine (AUVI-Q) 0.3 mg/0.3 mL IJ SOAJ injection Inject 0.3 mg into the muscle as needed for anaphylaxis. As directed for life-threatening allergic reactions 2 each 2   levothyroxine (SYNTHROID) 112 MCG tablet TAKE 1 TABLET(112 MCG) BY MOUTH DAILY 90 tablet 1   No current facility-administered medications for this visit.    REVIEW OF SYSTEMS:   Constitutional: ( - ) fevers, ( - )  chills , ( - ) night sweats Eyes: ( - ) blurriness of vision, ( - ) double vision, ( - ) watery eyes Ears, nose, mouth, throat, and face: ( - ) mucositis, ( - ) sore throat Respiratory: ( - ) cough, ( - ) dyspnea, ( - ) wheezes Cardiovascular: ( - ) palpitation, ( - ) chest discomfort, ( - ) lower extremity swelling Gastrointestinal:  ( - ) nausea, ( - ) heartburn, ( - ) change in bowel habits Skin: ( - ) abnormal skin rashes Lymphatics: ( - ) new lymphadenopathy, ( - ) easy bruising Neurological: ( - ) numbness, ( - ) tingling, ( - ) new weaknesses Behavioral/Psych: ( - ) mood change, ( - ) new changes  All other systems were reviewed with the patient and are negative.  PHYSICAL EXAMINATION:  Vitals:   10/02/22 1347  BP: (!) 152/77  Pulse: 68  Resp: 14  Temp: (!) 97.3 F (36.3 C)  SpO2: 100%   Filed Weights    10/02/22 1347  Weight: 223 lb 11.2 oz (101.5 kg)    GENERAL: well appearing middle-aged African-American female in NAD  SKIN: skin color, texture, turgor are normal, no rashes or significant lesions LYMPH: 2.0 cm enlarged lymph node of right lower cervical chain.  No other adenopathy notable. EYES: conjunctiva are pink and non-injected, sclera clear LUNGS: clear to auscultation and percussion with normal breathing effort HEART: regular rate & rhythm and no murmurs and no lower extremity edema Musculoskeletal: no cyanosis of digits and no clubbing  PSYCH: alert & oriented x 3, fluent speech NEURO: no focal motor/sensory deficits  LABORATORY DATA:  I have reviewed the data as listed    Latest Ref Rng & Units 10/02/2022    2:42 PM 04/09/2022   10:28 AM 12/04/2021    4:51 PM  CBC  WBC 4.0 - 10.5 K/uL 10.0  7.7  9.6   Hemoglobin 12.0 - 15.0 g/dL 12.8  11.6  12.2   Hematocrit 36.0 - 46.0 % 38.9  36.7  38.0   Platelets 150 - 400 K/uL 340  314  359        Latest Ref Rng & Units 10/02/2022    2:42 PM 09/02/2022    3:45 PM 04/09/2022   10:28 AM  CMP  Glucose 70 - 99 mg/dL 92  131  95   BUN 6 - 20 mg/dL 16  15  12   Creatinine 0.44 - 1.00 mg/dL 1.04  0.91  0.94   Sodium 135 - 145 mmol/L 141  141  143   Potassium 3.5 - 5.1 mmol/L 3.8  4.3  4.2   Chloride 98 - 111 mmol/L 105  102  105   CO2 22 - 32 mmol/L 28  24  23   Calcium 8.9 - 10.3 mg/dL 9.4  9.7  9.2   Total Protein 6.5 - 8.1   g/dL 9.1  7.6  7.6   Total Bilirubin 0.3 - 1.2 mg/dL 0.4   0.3   Alkaline Phos 38 - 126 U/L 79   84   AST 15 - 41 U/L 14   9   ALT 0 - 44 U/L 15   13      ASSESSMENT & PLAN Dana Vasquez 56 y.o. female with medical history significant for thyroid cancer status post surgery and radioactive iodine, fibromyalgia, hypothyroidism, plantar fasciitis, and GERD who presents for evaluation of a monoclonal gammopathy and prominent cervical lymph nodes.  After review of the labs, review of the records, and  discussion with the patient the patients findings are most consistent with a monoclonal gammopathy of unclear significance.  Monoclonal Gammopathies are a group of medical conditions defined by the presence of a monoclonal protein (an M protein) in the blood or urine. Monoclonal gammopathies include monoclonal gammopathy of unknown significance (MGUS), Monoclonal gammopathies of renal or neurological significance,  smoldering multiple myeloma (SMM), multiple myeloma (MM), AL amyloidosis, and Waldenstrom macroglobulinemia. The goal of the initial workup is to determine which monoclonal gammopathy a patient has. The workup consists of evaluating protein in the serum (with serum protein electrophoresis (SPEP) and serum free light chains) , evaluating protein in the urine (UPEP), and evaluation of the skeleton (DG Bone Met Survey) to assure no lytic lesions. Baseline bloodwork includes CMP and CBC. If no CRAB criteria or high risk criteria are noted then the diagnosis is MGUS. MGUS must be followed with bloodwork periodically to assure it does not convert to multiple myeloma (occurs to approximately 1% of patients per year). If there are CRAB criteria or high risk features (such as elevated serum free light chain ratio (taking into account renal function), a non IgG M protein, or M protein >1.5) then a bone marrow biopsy must be pursued.    #IgG Kappa Monoclonal Gammopathy --today will order an SPEP, UPEP, SFLC and beta 2 microglobulin --additionally will collect new baseline CBC, CMP, and LDH --recommend a metastatic bone survey to assess for lytic lesions --will consider the need for a bone marrow biopsy pending the above results --RTC in 6 months or sooner if bone marrow biopsy is required.   #Concern for Prominent Lymph Nodes --US on 09/04/2022 does not show any enlarged lymph nodes --repeat CT scan soft tissue neck scheduled for 10/17/2022.  --had normal ESR at last check on 05/14/2022 --Will assess  LDH and flow cytometry. --if concern for enlarged nodes on upcoming CT scan recommend notifying our team.    Orders Placed This Encounter  Procedures   DG Bone Survey Met    Standing Status:   Future    Standing Expiration Date:   10/03/2023    Order Specific Question:   Reason for Exam (SYMPTOM  OR DIAGNOSIS REQUIRED)    Answer:   monoclonal gammopathy, rule out lytic lesions    Order Specific Question:   Is patient pregnant?    Answer:   No    Order Specific Question:   Preferred imaging location?    Answer:   [facility]   CBC with Differential ([place] Only)    Standing Status:   Future    Number of Occurrences:   1    Standing Expiration Date:   10/03/2023   CMP ([place] only)    Standing Status:   Future    Number of Occurrences:   1    Standing Expiration Date:  10/03/2023   Lactate dehydrogenase (LDH)    Standing Status:   Future    Number of Occurrences:   1    Standing Expiration Date:   10/02/2023   Multiple Myeloma Panel (SPEP&IFE w/QIG)    Standing Status:   Future    Number of Occurrences:   1    Standing Expiration Date:   10/02/2023   Kappa/lambda light chains    Standing Status:   Future    Number of Occurrences:   1    Standing Expiration Date:   10/02/2023   Beta 2 microglobulin    Standing Status:   Future    Number of Occurrences:   1    Standing Expiration Date:   10/02/2023   24-Hr Ur UPEP/UIFE/Light Chains/TP    Standing Status:   Future    Standing Expiration Date:   10/02/2023   Sedimentation rate    Standing Status:   Future    Number of Occurrences:   1    Standing Expiration Date:   10/02/2023   C-reactive protein    Standing Status:   Future    Number of Occurrences:   1    Standing Expiration Date:   10/02/2023   Flow Cytometry, Peripheral Blood (Oncology)    Standing Status:   Future    Number of Occurrences:   1    Standing Expiration Date:   10/03/2023    All questions were answered. The patient knows to call the  clinic with any problems, questions or concerns.  A total of more than 60 minutes were spent on this encounter with face-to-face time and non-face-to-face time, including preparing to see the patient, ordering tests and/or medications, counseling the patient and coordination of care as outlined above.   Ledell Peoples, MD Department of Hematology/Oncology [place] [facility] Phone: [phone] Pager: [phone] Email: [email or web address]  10/02/2022 3:58 PM

## 2022-10-03 LAB — KAPPA/LAMBDA LIGHT CHAINS
Kappa free light chain: 32 mg/L — ABNORMAL HIGH (ref 3.3–19.4)
Kappa, lambda light chain ratio: 1.28 (ref 0.26–1.65)
Lambda free light chains: 25 mg/L (ref 5.7–26.3)

## 2022-10-03 LAB — BETA 2 MICROGLOBULIN, SERUM: Beta-2 Microglobulin: 1.9 mg/L (ref 0.6–2.4)

## 2022-10-04 LAB — SURGICAL PATHOLOGY

## 2022-10-04 LAB — FLOW CYTOMETRY

## 2022-10-05 ENCOUNTER — Encounter: Payer: Self-pay | Admitting: Hematology and Oncology

## 2022-10-07 ENCOUNTER — Telehealth: Payer: Self-pay | Admitting: *Deleted

## 2022-10-07 LAB — MULTIPLE MYELOMA PANEL, SERUM
Albumin SerPl Elph-Mcnc: 4 g/dL (ref 2.9–4.4)
Albumin/Glob SerPl: 1.1 (ref 0.7–1.7)
Alpha 1: 0.3 g/dL (ref 0.0–0.4)
Alpha2 Glob SerPl Elph-Mcnc: 0.8 g/dL (ref 0.4–1.0)
B-Globulin SerPl Elph-Mcnc: 1.1 g/dL (ref 0.7–1.3)
Gamma Glob SerPl Elph-Mcnc: 1.8 g/dL (ref 0.4–1.8)
Globulin, Total: 4 g/dL — ABNORMAL HIGH (ref 2.2–3.9)
IgA: 254 mg/dL (ref 87–352)
IgG (Immunoglobin G), Serum: 2105 mg/dL — ABNORMAL HIGH (ref 586–1602)
IgM (Immunoglobulin M), Srm: 39 mg/dL (ref 26–217)
M Protein SerPl Elph-Mcnc: 1.4 g/dL — ABNORMAL HIGH
Total Protein ELP: 8 g/dL (ref 6.0–8.5)

## 2022-10-07 NOTE — Telephone Encounter (Signed)
TCT patient regarding earache. Per Dr. Lorenso Courier "not likely related to MGUS or prominent lymph nodes. Can you call the patient to discuss (and make sure no urgent need for ED visit) and have Dana Vasquez put in an ENT consult for chronic ear pain/sore throat/prominent lymph nodes. "  Spoke with patient and advised of Dr. Libby Maw response. Pt states she does have an appt with ENT in February but will call to see if she can move up that appt.  She states that she was prescribed Bactrim and a Medrol dosepak for her presenting symptoms. Advised that it was ok to take those. Pt voiced understanding.

## 2022-10-09 ENCOUNTER — Ambulatory Visit (HOSPITAL_COMMUNITY)
Admission: RE | Admit: 2022-10-09 | Discharge: 2022-10-09 | Disposition: A | Discharge status: Home / Self Care | Payer: BC Managed Care – PPO | Source: Ambulatory Visit | Attending: Hematology and Oncology | Admitting: Hematology and Oncology

## 2022-10-09 ENCOUNTER — Other Ambulatory Visit: Payer: Self-pay | Admitting: *Deleted

## 2022-10-09 DIAGNOSIS — D472 Monoclonal gammopathy: Secondary | ICD-10-CM | POA: Diagnosis present

## 2022-10-15 ENCOUNTER — Encounter: Payer: Self-pay | Admitting: Hematology and Oncology

## 2022-10-15 LAB — UPEP/UIFE/LIGHT CHAINS/TP, 24-HR UR
% BETA, Urine: 24.8 %
ALPHA 1 URINE: 1.4 %
Albumin, U: 28.2 %
Alpha 2, Urine: 14.9 %
Free Kappa Lt Chains,Ur: 39.94 mg/L (ref 1.17–86.46)
Free Kappa/Lambda Ratio: 8.13 (ref 1.83–14.26)
Free Lambda Lt Chains,Ur: 4.91 mg/L (ref 0.27–15.21)
GAMMA GLOBULIN URINE: 30.8 %
M-SPIKE %, Urine: 16.7 % — ABNORMAL HIGH
M-Spike, Mg/24 Hr: 20 mg/24 hr — ABNORMAL HIGH
Total Protein, Urine-Ur/day: 117 mg/24 hr (ref 30–150)
Total Protein, Urine: 9 mg/dL
Total Volume: 1300

## 2022-10-16 ENCOUNTER — Encounter: Payer: Self-pay | Admitting: Hematology and Oncology

## 2022-10-17 ENCOUNTER — Inpatient Hospital Stay: Admission: RE | Admit: 2022-10-17 | Payer: BC Managed Care – PPO | Source: Ambulatory Visit

## 2022-11-21 ENCOUNTER — Ambulatory Visit (INDEPENDENT_AMBULATORY_CARE_PROVIDER_SITE_OTHER): Payer: BC Managed Care – PPO | Admitting: Internal Medicine

## 2022-11-21 ENCOUNTER — Encounter: Payer: Self-pay | Admitting: Hematology and Oncology

## 2022-11-21 ENCOUNTER — Encounter: Payer: Self-pay | Admitting: Internal Medicine

## 2022-11-21 ENCOUNTER — Other Ambulatory Visit: Payer: Self-pay

## 2022-11-21 VITALS — BP 130/82 | HR 75 | Temp 98.1°F | Ht 64.0 in | Wt 223.2 lb

## 2022-11-21 DIAGNOSIS — R591 Generalized enlarged lymph nodes: Secondary | ICD-10-CM

## 2022-11-21 DIAGNOSIS — E6609 Other obesity due to excess calories: Secondary | ICD-10-CM

## 2022-11-21 DIAGNOSIS — K5909 Other constipation: Secondary | ICD-10-CM

## 2022-11-21 DIAGNOSIS — M545 Low back pain, unspecified: Secondary | ICD-10-CM

## 2022-11-21 DIAGNOSIS — K921 Melena: Secondary | ICD-10-CM

## 2022-11-21 DIAGNOSIS — E7800 Pure hypercholesterolemia, unspecified: Secondary | ICD-10-CM

## 2022-11-21 DIAGNOSIS — I10 Essential (primary) hypertension: Secondary | ICD-10-CM

## 2022-11-21 DIAGNOSIS — Z6838 Body mass index (BMI) 38.0-38.9, adult: Secondary | ICD-10-CM

## 2022-11-21 LAB — CBC
Hematocrit: 37.2 % (ref 34.0–46.6)
Hemoglobin: 11.9 g/dL (ref 11.1–15.9)
MCH: 26 pg — ABNORMAL LOW (ref 26.6–33.0)
MCHC: 32 g/dL (ref 31.5–35.7)
MCV: 81 fL (ref 79–97)
Platelets: 338 10*3/uL (ref 150–450)
RBC: 4.58 x10E6/uL (ref 3.77–5.28)
RDW: 14.6 % (ref 11.7–15.4)
WBC: 9.9 10*3/uL (ref 3.4–10.8)

## 2022-11-21 LAB — POC HEMOCCULT BLD/STL (OFFICE/1-CARD/DIAGNOSTIC): Fecal Occult Blood, POC: NEGATIVE

## 2022-11-21 MED ORDER — ALBUTEROL SULFATE HFA 108 (90 BASE) MCG/ACT IN AERS: 2.0000 | INHALATION_SPRAY | Freq: Four times a day (QID) | RESPIRATORY_TRACT | 3 refills | Status: DC | PRN

## 2022-11-21 NOTE — Patient Instructions (Signed)
Acute Back Pain, Adult Acute back pain is sudden and usually short-lived. It is often caused by an injury to the muscles and tissues in the back. The injury may result from: A muscle, tendon, or ligament getting overstretched or torn. Ligaments are tissues that connect bones to each other. Lifting something improperly can cause a back strain. Wear and tear (degeneration) of the spinal disks. Spinal disks are circular tissue that provide cushioning between the bones of the spine (vertebrae). Twisting motions, such as while playing sports or doing yard work. A hit to the back. Arthritis. You may have a physical exam, lab tests, and imaging tests to find the cause of your pain. Acute back pain usually goes away with rest and home care. Follow these instructions at home: Managing pain, stiffness, and swelling Take over-the-counter and prescription medicines only as told by your health care provider. Treatment may include medicines for pain and inflammation that are taken by mouth or applied to the skin, or muscle relaxants. Your health care provider may recommend applying ice during the first 24-48 hours after your pain starts. To do this: Put ice in a plastic bag. Place a towel between your skin and the bag. Leave the ice on for 20 minutes, 2-3 times a day. Remove the ice if your skin turns bright red. This is very important. If you cannot feel pain, heat, or cold, you have a greater risk of damage to the area. If directed, apply heat to the affected area as often as told by your health care provider. Use the heat source that your health care provider recommends, such as a moist heat pack or a heating pad. Place a towel between your skin and the heat source. Leave the heat on for 20-30 minutes. Remove the heat if your skin turns bright red. This is especially important if you are unable to feel pain, heat, or cold. You have a greater risk of getting burned. Activity  Do not stay in bed. Staying in  bed for more than 1-2 days can delay your recovery. Sit up and stand up straight. Avoid leaning forward when you sit or hunching over when you stand. If you work at a desk, sit close to it so you do not need to lean over. Keep your chin tucked in. Keep your neck drawn back, and keep your elbows bent at a 90-degree angle (right angle). Sit high and close to the steering wheel when you drive. Add lower back (lumbar) support to your car seat, if needed. Take short walks on even surfaces as soon as you are able. Try to increase the length of time you walk each day. Do not sit, drive, or stand in one place for more than 30 minutes at a time. Sitting or standing for long periods of time can put stress on your back. Do not drive or use heavy machinery while taking prescription pain medicine. Use proper lifting techniques. When you bend and lift, use positions that put less stress on your back: [place] your knees. Keep the load close to your body. Avoid twisting. Exercise regularly as told by your health care provider. Exercising helps your back heal faster and helps prevent back injuries by keeping muscles strong and flexible. Work with a physical therapist to make a safe exercise program, as recommended by your health care provider. Do any exercises as told by your physical therapist. Lifestyle Maintain a healthy weight. Extra weight puts stress on your back and makes it difficult to have good  posture. Avoid activities or situations that make you feel anxious or stressed. Stress and anxiety increase muscle tension and can make back pain worse. Learn ways to manage anxiety and stress, such as through exercise. General instructions Sleep on a firm mattress in a comfortable position. Try lying on your side with your knees slightly bent. If you lie on your back, put a pillow under your knees. Keep your head and neck in a straight line with your spine (neutral position) when using electronic equipment like  smartphones or pads. To do this: Raise your smartphone or pad to look at it instead of bending your head or neck to look down. Put the smartphone or pad at the level of your face while looking at the screen. Follow your treatment plan as told by your health care provider. This may include: Cognitive or behavioral therapy. Acupuncture or massage therapy. Meditation or yoga. Contact a health care provider if: You have pain that is not relieved with rest or medicine. You have increasing pain going down into your legs or buttocks. Your pain does not improve after 2 weeks. You have pain at night. You lose weight without trying. You have a fever or chills. You develop nausea or vomiting. You develop abdominal pain. Get help right away if: You develop new bowel or bladder control problems. You have unusual weakness or numbness in your arms or legs. You feel faint. These symptoms may represent a serious problem that is an emergency. Do not wait to see if the symptoms will go away. Get medical help right away. Call your local emergency services (911 in the [place] Do not drive yourself to the hospital. Summary Acute back pain is sudden and usually short-lived. Use proper lifting techniques. When you bend and lift, use positions that put less stress on your back. Take over-the-counter and prescription medicines only as told by your health care provider, and apply heat or ice as told. This information is not intended to replace advice given to you by your health care provider. Make sure you discuss any questions you have with your health care provider. Document Revised: 01/05/2021 Document Reviewed: 01/05/2021 Elsevier Patient Education  2023 [employer]  

## 2022-11-21 NOTE — Progress Notes (Signed)
I,Dana Vasquez,acting as a scribe for Dana Greenland, MD.,have documented all relevant documentation on the behalf of Dana Greenland, MD,as directed by  Dana Greenland, MD while in the presence of Dana Greenland, MD.    Subjective:     Patient ID: Dana Vasquez , female    DOB: 10/16/1966 , 57 y.o.   MRN: [id number]   Chief Complaint  Patient presents with   Blood In Stools   Back Pain    HPI  Pt presents today for evaluation of lower back pain & noticing blood in her stool. She first noticed this yesterday. She does endorses constipation. This is unusual for her. When she wiped after using the restroom, there was blood on the tissue.   Regarding lower back pain, she has been dealing with it for a few weeks. She sleeps with a heating pad at night. She is not sure what may have triggered her symptoms.   She also wants to discuss the lab results from her hematologist. She does not feel they were adequately explained to her.   Back Pain This is a new problem. The current episode started in the past 7 days. The problem is unchanged. The quality of the pain is described as aching.     Past Medical History:  Diagnosis Date   Allergy    Anemia    Arthritis    knees  AND SHOULDERS   Asthma    allergy related - rarely uses inhaler   Bruises easily    Bunion    Cancer ([place])    1995;thyroid cancer, SURGERY AND RADIOACTIVE IODINE   Ectopic pregnancy    Euthyroid    Fatigue    Fibromyalgia    GERD (gastroesophageal reflux disease)    Headache(784.0)    otc med prn - last one 02/2014   History of blood transfusion 2000   In [place], [place] at [facility] - ? 4 units transfused   Hypothyroidism    Knee pain    Plantar fasciitis    Sleep apnea    uses CPAP   SVD (spontaneous vaginal delivery)    x 3   Thyroid disease    thyroidectomy 1995     Family History  Problem Relation Age of Onset   Hypertension Mother    Cancer Mother        uterine   Cancer Father     Cancer Brother        stomach; 53   Early death Brother    Stroke Maternal Uncle    Heart disease Maternal Uncle    Food Allergy Daughter    Allergic rhinitis Daughter    Allergic rhinitis Daughter    Angioedema Neg Hx    Asthma Neg Hx    Eczema Neg Hx    Immunodeficiency Neg Hx    Urticaria Neg Hx    Atopy Neg Hx      Current Outpatient Medications:    atorvastatin (LIPITOR) 10 MG tablet, Take 1 tablet by mouth daily., Disp: , Rfl:    cetirizine (ZYRTEC) 10 MG tablet, Take 1 tablet (10 mg total) by mouth daily., Disp: 90 tablet, Rfl: 2   EPINEPHrine (AUVI-Q) 0.3 mg/0.3 mL IJ SOAJ injection, Inject 0.3 mg into the muscle as needed for anaphylaxis. As directed for life-threatening allergic reactions, Disp: 2 each, Rfl: 2   levothyroxine (SYNTHROID) 112 MCG tablet, TAKE 1 TABLET(112 MCG) BY MOUTH DAILY, Disp: 90 tablet, Rfl: 1   albuterol (VENTOLIN  HFA) 108 (90 Base) MCG/ACT inhaler, Inhale 2 puffs into the lungs every 6 (six) hours as needed for wheezing or shortness of breath., Disp: 54 g, Rfl: 3   Allergies  Allergen Reactions   Sulfa Antibiotics Swelling   Amoxicillin Other (See Comments)    Headache Has patient had a PCN reaction causing immediate rash, facial/tongue/throat swelling, SOB or lightheadedness with hypotension: No Has patient had a PCN reaction causing severe rash involving mucus membranes or skin necrosis: No Has patient had a PCN reaction that required hospitalization: No Has patient had a PCN reaction occurring within the last 10 years: Unknown If all of the above answers are "NO", then may proceed with Cephalosporin use.    Nitrofurantoin Monohyd Macro Other (See Comments)    Pt stated she gets a bad yeast infection   Dexilant [Dexlansoprazole]     "bad stomachache"   Meloxicam     Upset stomach and stomach pains   Budesonide Rash   Formoterol Rash   Formoterol Fumarate Rash     Review of Systems  Constitutional: Negative.   Respiratory: Negative.     Cardiovascular: Negative.   Gastrointestinal:  Positive for constipation.  Musculoskeletal:  Positive for back pain.  Neurological: Negative.   Psychiatric/Behavioral: Negative.       Today's Vitals   11/21/22 1140  BP: 130/82  Pulse: 75  Temp: 98.1 F (36.7 C)  SpO2: 98%  Weight: 223 lb 3.2 oz (101.2 kg)  Height: 5' 4"$  (1.626 m)   Body mass index is 38.31 kg/m.  Wt Readings from Last 3 Encounters:  11/21/22 223 lb 3.2 oz (101.2 kg)  10/02/22 223 lb 11.2 oz (101.5 kg)  09/02/22 222 lb (100.7 kg)    Objective:  Physical Exam Vitals and nursing note reviewed.  Constitutional:      Appearance: Normal appearance.  HENT:     Head: Normocephalic and atraumatic.     Nose:     Comments: Masked     Mouth/Throat:     Comments: Masked  Eyes:     Extraocular Movements: Extraocular movements intact.  Cardiovascular:     Rate and Rhythm: Normal rate and regular rhythm.     Heart sounds: Normal heart sounds.  Pulmonary:     Effort: Pulmonary effort is normal.     Breath sounds: Normal breath sounds.  Genitourinary:    Rectum: Guaiac result negative. No mass or tenderness.  Musculoskeletal:     Cervical back: Normal range of motion.  Skin:    General: Skin is warm.  Neurological:     General: No focal deficit present.     Mental Status: She is alert.  Psychiatric:        Mood and Affect: Mood normal.        Behavior: Behavior normal.      Assessment And Plan:     1. Blood in stool Comments: Rectal exam performed, stool heme negative. I will refer her to GI for further evaluation. Possibly due to mucosal tear from constipation.  Last colonoscopy was in 2018, pos for diverticulosis.  - POC Hemoccult Bld/Stl (1-Cd Office Dx) - CBC no Diff  2. Acute bilateral low back pain without sciatica Comments: Possibly due to constipation. Could also be musculoskeletal in nature. Will consider PT/Chiro if sx persist.  3. Other constipation Comments: She should increase  fiber/water intake. Consider Miralax if persistent.  4. Class 2 obesity due to excess calories without serious comorbidity with body mass index (BMI) of 38.0  to 38.9 in adult She is encouraged to strive for BMI less than 30 to decrease cardiac risk. Advised to aim for at least 150 minutes of exercise per week.    Patient was given opportunity to ask questions. Patient verbalized understanding of the plan and was able to repeat key elements of the plan. All questions were answered to their satisfaction.   I, Dana Greenland, MD, have reviewed all documentation for this visit. The documentation on 11/21/22 for the exam, diagnosis, procedures, and orders are all accurate and complete.   IF YOU HAVE BEEN REFERRED TO A SPECIALIST, IT MAY TAKE 1-2 WEEKS TO SCHEDULE/PROCESS THE REFERRAL. IF YOU HAVE NOT HEARD FROM US/SPECIALIST IN TWO WEEKS, PLEASE GIVE [place] A CALL AT [phone].   THE PATIENT IS ENCOURAGED TO PRACTICE SOCIAL DISTANCING DUE TO THE COVID-19 PANDEMIC.

## 2022-11-28 ENCOUNTER — Encounter: Payer: Self-pay | Admitting: Nurse Practitioner

## 2022-12-04 DIAGNOSIS — R490 Dysphonia: Secondary | ICD-10-CM | POA: Insufficient documentation

## 2022-12-04 DIAGNOSIS — J385 Laryngeal spasm: Secondary | ICD-10-CM | POA: Insufficient documentation

## 2022-12-20 DIAGNOSIS — D472 Monoclonal gammopathy: Secondary | ICD-10-CM | POA: Insufficient documentation

## 2022-12-24 DIAGNOSIS — Z860100 Personal history of colon polyps, unspecified: Secondary | ICD-10-CM

## 2022-12-24 HISTORY — DX: Personal history of colon polyps, unspecified: Z86.0100

## 2022-12-29 NOTE — Progress Notes (Unsigned)
12/29/2022 Dana Vasquez [id number] [birth year]   CHIEF COMPLAINT: Schedule colonoscopy  HISTORY OF PRESENT ILLNESS: Dana Vasquez is a 57 year old female with a past medical history of arthritis, asthma, sleep apnea, thyroid cancer 1995 s/p thyroidectomy and radioactive iodine, GERD, anemia and recently diagnosed with MGUS. S/P gastric sleeve surgery 2019. She is known by Dr. Carlean Purl. She presents to our office today as referred by Dr Glendale Chard for further evaluation regarding bloody stools which occurred 3 to 4 weeks ago.  She has intermittent constipation and sometimes strains to pass a bowel movement. She had 2 episodes January 2024 when had rectal bleeding.  The first episode occurred after passed a normal bowel movement, she saw a small amount of bright red blood on the toilet tissue and in the toilet water. The second episode occurred on 11/20/2022, she saw a small amount of bright red blood on the toilet tissue and not in the water.  No associated anorectal pain.  She saw her PCP the next day and a rectal exam was unrevealing and stool was guaiac negative. She denies having any further rectal bleeding since then. Her most recent colonoscopy was 08/01/2017 which showed diverticulosis otherwise was normal.   She has a history of GERD with chronic throat issues. She describes having frequent yellow mucus in her throat, recently treated with an antibiotic for suspected URI. She has intermittent odynophagia and voice hoarseness which she believes started after she had COVID in 2020. She went to speech therapy at [facility] rehab for 8 sessions in 2021 with some voice improvement. 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 She was started on Pantoprazole 20 mg every other day by her ENT for suspected laryngeal reflux 09/2022.  She has heartburn approximately 2 days weekly and  intermittently has pain in her throat when she swallows which typically occurs for 2 days and goes away for a while then recurs.  No dysphagia with solids, liquids or pills.  Her brother died from stomach cancer the age of 93.      Latest Ref Rng & Units 11/21/2022   12:51 PM 10/02/2022    2:42 PM 04/09/2022   10:28 AM  CBC  WBC 3.4 - 10.8 x10E3/uL 9.9  10.0  7.7   Hemoglobin 11.1 - 15.9 g/dL 11.9  12.8  11.6   Hematocrit 34.0 - 46.6 % 37.2  38.9  36.7   Platelets 150 - 450 x10E3/uL 338  340  314        Latest Ref Rng & Units 10/02/2022    2:42 PM 09/02/2022    3:45 PM 04/09/2022   10:28 AM  CMP  Glucose 70 - 99 mg/dL 92  131  95   BUN 6 - 20 mg/dL '16  15  12   '$ Creatinine 0.44 - 1.00 mg/dL 1.04  0.91  0.94   Sodium 135 - 145 mmol/L 141  141  143   Potassium 3.5 - 5.1 mmol/L 3.8  4.3  4.2   Chloride 98 - 111 mmol/L 105  102  105   CO2 22 - 32 mmol/L '28  24  23   '$ Calcium 8.9 - 10.3 mg/dL 9.4  9.7  9.2   Total Protein 6.5 - 8.1 g/dL 9.1  7.6  7.6   Total Bilirubin 0.3 - 1.2 mg/dL 0.4   0.3   Alkaline Phos  38 - 126 U/L 79   84   AST 15 - 41 U/L 14   9   ALT 0 - 44 U/L 15   13      Flexible Laryngoscopy 09/06/2023 by David Madagascar our PA-C: Indications: Hoarseness  Risks, benefits and clinical relevance of the study were discussed. The patient understands and agrees to proceed.   Procedure: After adequate topical anesthetic was applied, 2 mm flexible laryngoscope was passed through the nasal cavity without difficulty. Flexible laryngoscopy shows patent anterior nasal cavity with minimal crusting, no discharge or infection.  Normal base of tongue and supraglottis Vocal cord movement suggests muscle tension dysphonia. No nodule, mass, polyp or tumor. Hypopharynx shows some mild edema posterior commissure without mass, pooling of secretions or aspiration.  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 Carlean Purl  08/01/2017: - The perianal and digital rectal examinations were normal.  - Multiple small-mouthed diverticula were found in the sigmoid colon. There was no evidence of diverticular bleeding.  - The exam was otherwise without abnormality on direct and retroflexion views - 10 year recall colonoscopy   Colonoscopy 11/24/2008: 1) Mild diverticulosis in the sigmoid colon 2) External hemorrhoids in the rectum 3) ? Proctitis in the rectum - biopsies normal 4) Nodular mucosa in the ileum - biopsies normal 5) Otherwise normal, excellent prep 1. ILEUM, BIOPSY:   - BENIGN SMALL BOWEL MUCOSA WITH REACTIVE LYMPHOID   AGGREGATES.   - THERE IS NO EVIDENCE OF SIGNIFICANT INFLAMMATION,   DYSPLASIA, OR MALIGNANCY.    2. RECTUM, BIOPSY:   - BENIGN COLONIC MUCOSA WITH FOCAL SURFACE EPITHELIAL   DENUDATION.   - NO SIGNIFICANT INFLAMMATION OR OTHER ABNORMALITIES   IDENTIFIED.   EGD 10/26/2008: Nodular mucosa in the bulb of duodenum, question polyp.  Biopsied. 2 cm hiatal hernia, sliding Mild gastritis in the antrum, biopsied Otherwise normal examination Strong gag reflex to scope 1. DUODENAL BULB: FINDINGS CONSISTENT WITH GASTRIC HETEROTOPIA.   NO ADENOMATOUS CHANGE OR EVIDENCE OF MALIGNANCY.   2. STOMACH: MILD CHRONIC GASTRITIS. NO HELICOBACTER PYLORI,   DYSPLASIA OR EVIDENCE OF MALIGNANCY IDENTIFIED.   Colonoscopy 01/15/2005: Diverticulosis External hemorrhoids  Past Medical History:  Diagnosis Date   Allergy    Anemia    Arthritis    knees  AND SHOULDERS   Asthma    allergy related - rarely uses inhaler   Bruises easily    Bunion    Cancer ([place])    1995;thyroid cancer, SURGERY AND RADIOACTIVE IODINE   Ectopic pregnancy    Euthyroid    Fatigue    Fibromyalgia    GERD (gastroesophageal reflux disease)    Headache(784.0)    otc med prn - last one 02/2014   History of blood transfusion 2000   In [place], [place] at [facility] - ? 4 units transfused   Hypothyroidism    Knee pain     Plantar fasciitis    Sleep apnea    uses CPAP   SVD (spontaneous vaginal delivery)    x 3   Thyroid disease    thyroidectomy 1995   Past Surgical History:  Procedure Laterality Date   ABDOMINAL HYSTERECTOMY N/A 04/20/2014   Procedure: Total ABDOMINAL HYSTERECTOMY partial right salpingectomy;  Surgeon: Eldred Manges, MD;  Location: [facility];  Service: Gynecology;  Laterality: N/A;   BREATH TEK H PYLORI  09/18/2011   Procedure: BREATH TEK H PYLORI;  Surgeon: Pedro Earls, MD;  Location: [facility];  Service: General;  Laterality: N/A;   BUNIONECTOMY     left;2015   BUNIONECTOMY     COLONOSCOPY     DILATION AND CURETTAGE OF UTERUS  1988   endometriosis   ECTOPIC PREGNANCY SURGERY     fallopian tubes removed     left tube removed per patient - laparotomy   FOOT SURGERY Right 2019   FOOT SURGERY Bilateral 2021   LAPAROSCOPIC GASTRIC SLEEVE RESECTION N/A 04/14/2018   Procedure: LAPAROSCOPIC GASTRIC SLEEVE RESECTION WITH UPPER ENDO AND ERAS PATHWAY;  Surgeon: Johnathan Hausen, MD;  Location: [facility];  Service: General;  Laterality: N/A;   LYSIS OF ADHESION N/A 04/20/2014   Procedure: LYSIS OF ADHESION;  Surgeon: Eldred Manges, MD;  Location: [facility];  Service: Gynecology;  Laterality: N/A;   THYROIDECTOMY  1995   TUBAL LIGATION     WISDOM TOOTH EXTRACTION     Social History:  She reports that she has never smoked. She has never used smokeless tobacco.  No alcohol use.  No drug use.  She is a Radio producer.  Family History:  Brother had stomach cancer. Father had prostate cancer Mother had uterine cancer. No known family history of esophageal or colorectal cancer.   Allergies  Allergen Reactions   Sulfa Antibiotics Swelling   Amoxicillin Other (See Comments)    Headache Has patient had a PCN reaction causing immediate rash, facial/tongue/throat swelling, SOB or lightheadedness with hypotension: No Has patient had a PCN reaction causing severe rash involving mucus membranes or  skin necrosis: No Has patient had a PCN reaction that required hospitalization: No Has patient had a PCN reaction occurring within the last 10 years: Unknown If all of the above answers are "NO", then may proceed with Cephalosporin use.    Nitrofurantoin Monohyd Macro Other (See Comments)    Pt stated she gets a bad yeast infection   Dexilant [Dexlansoprazole]     "bad stomachache"   Meloxicam     Upset stomach and stomach pains   Budesonide Rash   Formoterol Rash   Formoterol Fumarate Rash      Outpatient Encounter Medications as of 12/30/2022  Medication Sig   albuterol (VENTOLIN HFA) 108 (90 Base) MCG/ACT inhaler Inhale 2 puffs into the lungs every 6 (six) hours as needed for wheezing or shortness of breath.   atorvastatin (LIPITOR) 10 MG tablet Take 1 tablet by mouth daily.   cetirizine (ZYRTEC) 10 MG tablet Take 1 tablet (10 mg total) by mouth daily.   EPINEPHrine (AUVI-Q) 0.3 mg/0.3 mL IJ SOAJ injection Inject 0.3 mg into the muscle as needed for anaphylaxis. As directed for life-threatening allergic reactions   levothyroxine (SYNTHROID) 112 MCG tablet TAKE 1 TABLET(112 MCG) BY MOUTH DAILY   No facility-administered encounter medications on file as of 12/30/2022.    REVIEW OF SYSTEMS:  Gen: Denies fever, sweats or chills. No weight loss.  CV: Denies chest pain, palpitations or edema. Resp: Denies cough, shortness of breath of hemoptysis.  GI: See HPI.  GU : Denies urinary burning, blood in urine, increased urinary frequency or incontinence. MS: Denies joint pain, muscles aches or weakness. Derm: Denies rash, itchiness, skin lesions or unhealing ulcers. Psych: Denies depression, anxiety, memory loss or confusion. Heme: + Bruises easily. Neuro:  Denies headaches, dizziness or paresthesias. Endo:  + Alopecia. Denies any problems with DM, thyroid or adrenal function.  PHYSICAL EXAM: LMP 04/07/2014  BP 126/72   Pulse 66   Ht '5\' 5"'$  (1.651 m)  Wt 221 lb 12.8 oz (100.6 kg)    LMP 04/07/2014   SpO2 95%   BMI 36.91 kg/m   General:  57 year old female in no acute distress. Head: Normocephalic and atraumatic. Eyes:  Sclerae non-icteric, conjunctive pink. Ears: Normal auditory acuity. Mouth: Dentition intact. No ulcers or lesions.  Neck: Supple, no lymphadenopathy or thyromegaly.  Lungs: Clear bilaterally to auscultation without wheezes, crackles or rhonchi. Heart: Regular rate and rhythm. No murmur, rub or gallop appreciated.  Abdomen: Soft, nondistended. Mild lower abdominal tenderness without rebound or guarding. No masses. No hepatosplenomegaly. Normoactive bowel sounds x 4 quadrants.  Rectal: Deferred.  Musculoskeletal: Symmetrical with no gross deformities. Skin: Warm and dry. No rash or lesions on visible extremities. Extremities: No edema. Neurological: Alert oriented x 4, no focal deficits.  Psychological:  Alert and cooperative. Normal mood and affect.  ASSESSMENT AND PLAN:  57 year old female with painless bright red rectal bleeding x 2 episodes Jan 2024.  Colonoscopy 07/2017 showed diverticulosis to the sigmoid colon otherwise was normal. -Colonoscopy benefits and risks discussed including risk with sedation, risk of bleeding, perforation and infection  -Benefiber 1 tablespoon daily -MiraLAX nightly as tolerated -Patient to contact office if rectal bleeding recurs prior to her colonoscopy date  History of a 2cm hiatal hernia and GERD with chronic voice hoarseness and intermittent odynophagia without dysphagia.  Diagnosed with muscle tension dysphonia and laryngeal reflux per ENT.  -EGD benefits and risks discussed including risk with sedation, risk of bleeding, perforation and infection  -Continue Pantoprazole 20 mg p.o. daily -GERD Diet  S/P gastric sleeve surgery 2019  MGUS, physiologic lymph nodes followed by hematologist Dr. Lorenso Courier   Further recommendations to be determined after EGD and colonoscopy completed         CC:  Glendale Chard, MD

## 2022-12-30 ENCOUNTER — Ambulatory Visit: Payer: BC Managed Care – PPO | Admitting: Nurse Practitioner

## 2022-12-30 ENCOUNTER — Encounter: Payer: Self-pay | Admitting: Nurse Practitioner

## 2022-12-30 VITALS — BP 126/72 | HR 66 | Ht 65.0 in | Wt 221.8 lb

## 2022-12-30 DIAGNOSIS — K625 Hemorrhage of anus and rectum: Secondary | ICD-10-CM | POA: Diagnosis not present

## 2022-12-30 DIAGNOSIS — K219 Gastro-esophageal reflux disease without esophagitis: Secondary | ICD-10-CM | POA: Diagnosis not present

## 2022-12-30 MED ORDER — NA SULFATE-K SULFATE-MG SULF 17.5-3.13-1.6 GM/177ML PO SOLN: 1.0000 | Freq: Once | ORAL | 0 refills | Status: AC

## 2022-12-30 NOTE — Patient Instructions (Addendum)
You have been scheduled for an endoscopy and colonoscopy. Please follow the written instructions given to you at your visit today. Please pick up your prep supplies at the pharmacy within the next 1-3 days. If you use inhalers (even only as needed), please bring them with you on the day of your procedure.   Benefiber- 1 tablespoon daily Miralax- every night as needed  Due to recent changes in healthcare laws, you may see the results of your imaging and laboratory studies on MyChart before your provider has had a chance to review them.  We understand that in some cases there may be results that are confusing or concerning to you. Not all laboratory results come back in the same time frame and the provider may be waiting for multiple results in order to interpret others.  Please give [place] 48 hours in order for your provider to thoroughly review all the results before contacting the office for clarification of your results.    Thank you for trusting me with your gastrointestinal care!   Carl Best, CRNP

## 2023-01-15 ENCOUNTER — Other Ambulatory Visit: Payer: Self-pay | Admitting: Internal Medicine

## 2023-01-16 ENCOUNTER — Encounter: Payer: Self-pay | Admitting: Internal Medicine

## 2023-01-22 ENCOUNTER — Encounter: Payer: Self-pay | Admitting: Internal Medicine

## 2023-01-22 ENCOUNTER — Ambulatory Visit (AMBULATORY_SURGERY_CENTER): Payer: BC Managed Care – PPO | Admitting: Internal Medicine

## 2023-01-22 VITALS — BP 121/79 | HR 62 | Temp 97.7°F | Resp 9 | Ht 65.0 in | Wt 221.0 lb

## 2023-01-22 DIAGNOSIS — K635 Polyp of colon: Secondary | ICD-10-CM

## 2023-01-22 DIAGNOSIS — D123 Benign neoplasm of transverse colon: Secondary | ICD-10-CM

## 2023-01-22 DIAGNOSIS — D124 Benign neoplasm of descending colon: Secondary | ICD-10-CM | POA: Diagnosis not present

## 2023-01-22 DIAGNOSIS — K219 Gastro-esophageal reflux disease without esophagitis: Secondary | ICD-10-CM | POA: Diagnosis not present

## 2023-01-22 DIAGNOSIS — K2990 Gastroduodenitis, unspecified, without bleeding: Secondary | ICD-10-CM | POA: Diagnosis present

## 2023-01-22 DIAGNOSIS — K2970 Gastritis, unspecified, without bleeding: Secondary | ICD-10-CM

## 2023-01-22 DIAGNOSIS — K625 Hemorrhage of anus and rectum: Secondary | ICD-10-CM

## 2023-01-22 MED ORDER — SODIUM CHLORIDE 0.9 % IV SOLN: 500.0000 mL | Freq: Once | INTRAVENOUS | Status: DC

## 2023-01-22 NOTE — Patient Instructions (Addendum)
There is some mild inflammation of the stomach - biopsies taken.  Two tiny colon polyps removed - will be analyzed.  There is some possible inflammation in the rectum - biopsied.  You also have a condition called diverticulosis - common and not usually a problem. Please read the handout provided.  Once I see pathology results will let you know.  I appreciate the opportunity to care for you. Gatha Mayer, MD, FACG   YOU HAD AN ENDOSCOPIC PROCEDURE TODAY AT [facility]:   Refer to the procedure report that was given to you for any specific questions about what was found during the examination.  If the procedure report does not answer your questions, please call your gastroenterologist to clarify.  If you requested that your care partner not be given the details of your procedure findings, then the procedure report has been included in a sealed envelope for you to review at your convenience later.  YOU SHOULD EXPECT: Some feelings of bloating in the abdomen. Passage of more gas than usual.  Walking can help get rid of the air that was put into your GI tract during the procedure and reduce the bloating. If you had a lower endoscopy (such as a colonoscopy or flexible sigmoidoscopy) you may notice spotting of blood in your stool or on the toilet paper. If you underwent a bowel prep for your procedure, you may not have a normal bowel movement for a few days.  Please Note:  You might notice some irritation and congestion in your nose or some drainage.  This is from the oxygen used during your procedure.  There is no need for concern and it should clear up in a day or so.  SYMPTOMS TO REPORT IMMEDIATELY:  Following lower endoscopy (colonoscopy or flexible sigmoidoscopy):  Excessive amounts of blood in the stool  Significant tenderness or worsening of abdominal pains  Swelling of the abdomen that is new, acute  Fever of 100F or higher  Following upper endoscopy (EGD)  Vomiting  of blood or coffee ground material  New chest pain or pain under the shoulder blades  Painful or persistently difficult swallowing  New shortness of breath  Fever of 100F or higher  Black, tarry-looking stools  For urgent or emergent issues, a gastroenterologist can be reached at any hour by calling [phone]. Do not use MyChart messaging for urgent concerns.    DIET:  We do recommend a small meal at first, but then you may proceed to your regular diet.  Drink plenty of fluids but you should avoid alcoholic beverages for 24 hours.  ACTIVITY:  You should plan to take it easy for the rest of today and you should NOT DRIVE or use heavy machinery until tomorrow (because of the sedation medicines used during the test).    FOLLOW UP: Our staff will call the number listed on your records the next business day following your procedure.  We will call around 7:15- 8:00 am to check on you and address any questions or concerns that you may have regarding the information given to you following your procedure. If we do not reach you, we will leave a message.     If any biopsies were taken you will be contacted by phone or by letter within the next 1-3 weeks.  Please call us at [phone] if you have not heard about the biopsies in 3 weeks.    SIGNATURES/CONFIDENTIALITY: You and/or your care partner have signed paperwork which  will be entered into your electronic medical record.  These signatures attest to the fact that that the information above on your After Visit Summary has been reviewed and is understood.  Full responsibility of the confidentiality of this discharge information lies with you and/or your care-partner.

## 2023-01-22 NOTE — Progress Notes (Signed)
Called to room to assist during endoscopic procedure.  Patient ID and intended procedure confirmed with present staff. Received instructions for my participation in the procedure from the performing physician.  

## 2023-01-22 NOTE — Progress Notes (Signed)
Pt's states no medical or surgical changes since previsit or office visit. 

## 2023-01-22 NOTE — Op Note (Signed)
[place] Patient Name: Dana Vasquez Procedure Date: 01/22/2023 8:10 AM MRN: [id number] Endoscopist: Gatha Mayer , MD, [id number] Age: 57 Referring MD:  Date of Birth: [birth year] Gender: Female Account #: [id number] Procedure:                Upper GI endoscopy Indications:              Heartburn, Sore throat Medicines:                Monitored Anesthesia Care Procedure:                Pre-Anesthesia Assessment:                           - Prior to the procedure, a History and Physical                            was performed, and patient medications and                            allergies were reviewed. The patient's tolerance of                            previous anesthesia was also reviewed. The risks                            and benefits of the procedure and the sedation                            options and risks were discussed with the patient.                            All questions were answered, and informed consent                            was obtained. Prior Anticoagulants: The patient has                            taken no anticoagulant or antiplatelet agents. ASA                            Grade Assessment: III - A patient with severe                            systemic disease. After reviewing the risks and                            benefits, the patient was deemed in satisfactory                            condition to undergo the procedure.                           After obtaining informed consent, the endoscope was  passed under direct vision. Throughout the                            procedure, the patient's blood pressure, pulse, and                            oxygen saturations were monitored continuously. The                            GIF [id number] was introduced through the                            mouth, and advanced to the second part of duodenum.                            The upper GI endoscopy was  accomplished without                            difficulty. The patient tolerated the procedure                            well. Scope In: 8:28:06 AM Scope Out: 8:43:47 AM Scope Withdrawal Time: 0 hours 12 minutes 32 seconds  Total Procedure Duration: 0 hours 15 minutes 41 seconds  Findings:                 Evidence of a sleeve gastrectomy was found in the                            stomach.                           Patchy mild inflammation characterized by erosions                            and erythema was found in the prepyloric region of                            the stomach. Biopsies were taken with a cold                            forceps for histology. Verification of patient                            identification for the specimen was done. Estimated                            blood loss was minimal.                           The exam was otherwise without abnormality.                           The cardia and gastric fundus were normal on  retroflexion. Complications:            No immediate complications. Estimated Blood Loss:     Estimated blood loss was minimal. Impression:               - A sleeve gastrectomy was found.                           - Gastritis. Biopsied.                           - The examination was otherwise normal. Photo                            function malfunction so no images Recommendation:           - Patient has a contact number available for                            emergencies. The signs and symptoms of potential                            delayed complications were discussed with the                            patient. Return to normal activities tomorrow.                            Written discharge instructions were provided to the                            patient.                           - Resume previous diet.                           - Continue present medications.                           - Await  pathology results.                           - See the other procedure note for documentation of                            additional recommendations.                           - Episodes of spitting up mucous/plegm and LPR sxs                            - not sure there is a correlate here. Speech path                            is helping some. May need to retry PPI at hiogher  dose to see if we can suppress sxs. Consider ba                            swallow eval. Gatha Mayer, MD 01/22/2023 9:00:31 AM This report has been signed electronically.

## 2023-01-22 NOTE — Op Note (Signed)
[place] Patient Name: Dana Vasquez Procedure Date: 01/22/2023 8:11 AM MRN: [id number] Endoscopist: Gatha Mayer , MD, [id number] Age: 57 Referring MD:  Date of Birth: 1966-03-10 Gender: Female Account #: [id number] Procedure:                Colonoscopy Indications:              Rectal bleeding Medicines:                Monitored Anesthesia Care Procedure:                Pre-Anesthesia Assessment:                           - Prior to the procedure, a History and Physical                            was performed, and patient medications and                            allergies were reviewed. The patient's tolerance of                            previous anesthesia was also reviewed. The risks                            and benefits of the procedure and the sedation                            options and risks were discussed with the patient.                            All questions were answered, and informed consent                            was obtained. Prior Anticoagulants: The patient has                            taken no anticoagulant or antiplatelet agents. ASA                            Grade Assessment: III - A patient with severe                            systemic disease. After reviewing the risks and                            benefits, the patient was deemed in satisfactory                            condition to undergo the procedure.                           - Prior to the procedure, a History and Physical  was performed, and patient medications and                            allergies were reviewed. The patient's tolerance of                            previous anesthesia was also reviewed. The risks                            and benefits of the procedure and the sedation                            options and risks were discussed with the patient.                            All questions were answered, and informed consent                             was obtained. Prior Anticoagulants: The patient has                            taken no anticoagulant or antiplatelet agents. ASA                            Grade Assessment: III - A patient with severe                            systemic disease. After reviewing the risks and                            benefits, the patient was deemed in satisfactory                            condition to undergo the procedure.                           After obtaining informed consent, the colonoscope                            was passed under direct vision. Throughout the                            procedure, the patient's blood pressure, pulse, and                            oxygen saturations were monitored continuously. The                            CF [id number] was introduced through the anus                            and advanced to the the cecum, identified by  appendiceal orifice and ileocecal valve. The                            colonoscopy was performed without difficulty. The                            patient tolerated the procedure well. The quality                            of the bowel preparation was good. The ileocecal                            valve, appendiceal orifice, and rectum were not                            photographed due to image malfunction. The bowel                            preparation used was SUPREP via split dose                            instruction. Scope In: Scope Out: Findings:                 The perianal and digital rectal examinations were                            normal.                           Two sessile polyps were found in the descending                            colon and transverse colon. The polyps were                            diminutive in size. These polyps were removed with                            a cold snare. Resection and retrieval were                             complete. Verification of patient identification                            for the specimen was done. Estimated blood loss was                            minimal.                           Multiple diverticula were found in the sigmoid                            colon.  A diffuse area of moderately congested and                            erythematous mucosa was found in the distal rectum.                            Biopsies were taken with a cold forceps for                            histology. Verification of patient identification                            for the specimen was done. Estimated blood loss was                            minimal.                           The exam was otherwise without abnormality on                            direct and retroflexion views. Complications:            No immediate complications. Estimated Blood Loss:     Estimated blood loss was minimal. Impression:               - Two diminutive polyps in the descending colon and                            in the transverse colon, removed with a cold snare.                            Resected and retrieved.                           - Diverticulosis in the sigmoid colon.                           - Congested and erythematous mucosa in the distal                            rectum. Biopsied.                           - The examination was otherwise normal on direct                            and retroflexion views. Photo function was not                            working so no images Recommendation:           - Patient has a contact number available for                            emergencies. The signs and symptoms of  potential                            delayed complications were discussed with the                            patient. Return to normal activities tomorrow.                            Written discharge instructions were provided to the                             patient.                           - Resume previous diet.                           - Continue present medications.                           - Await pathology results.                           - Repeat colonoscopy is recommended. The                            colonoscopy date will be determined after pathology                            results from today's exam become available for                            review. Gatha Mayer, MD 01/22/2023 9:03:15 AM This report has been signed electronically.

## 2023-01-22 NOTE — Progress Notes (Signed)
History and Physical Interval Note:  01/22/2023 8:10 AM  Dana Vasquez  has presented today for endoscopic procedure(s), with the diagnosis of  Encounter Diagnoses  Name Primary?   Gastroesophageal reflux disease without esophagitis Yes   Rectal bleeding   .  The various methods of evaluation and treatment have been discussed with the patient and/or family. After consideration of risks, benefits and other options for treatment, the patient has consented to  the endoscopic procedure(s).   The patient's history has been reviewed, patient examined, no change in status, stable for endoscopic procedure(s).  I have reviewed the patient's chart and labs.  Questions were answered to the patient's satisfaction.     Gatha Mayer, MD, [facility]

## 2023-01-22 NOTE — Progress Notes (Signed)
Vss nad trans to pacu 

## 2023-01-23 ENCOUNTER — Telehealth: Payer: Self-pay

## 2023-01-23 NOTE — Telephone Encounter (Signed)
  Follow up Call-     01/22/2023    7:49 AM  Call back number  Post procedure Call Back phone  # [phone]  Permission to leave phone message Yes     Patient questions:  Do you have a fever, pain , or abdominal swelling? No. Pain Score  0 *  Have you tolerated food without any problems? Yes.    Have you been able to return to your normal activities? Yes.    Do you have any questions about your discharge instructions: Diet   No. Medications  No. Follow up visit  No.  Do you have questions or concerns about your Care? No.  Actions: * If pain score is 4 or above: No action needed, pain <4.

## 2023-01-27 LAB — HM DIABETES EYE EXAM

## 2023-02-01 ENCOUNTER — Encounter: Payer: Self-pay | Admitting: Internal Medicine

## 2023-02-13 ENCOUNTER — Encounter: Payer: Self-pay | Admitting: Allergy

## 2023-02-13 ENCOUNTER — Ambulatory Visit (INDEPENDENT_AMBULATORY_CARE_PROVIDER_SITE_OTHER): Payer: BC Managed Care – PPO | Admitting: Allergy

## 2023-02-13 ENCOUNTER — Other Ambulatory Visit: Payer: Self-pay

## 2023-02-13 VITALS — BP 130/86 | HR 70 | Temp 98.1°F | Resp 16 | Ht 64.0 in | Wt 233.2 lb

## 2023-02-13 DIAGNOSIS — J3089 Other allergic rhinitis: Secondary | ICD-10-CM

## 2023-02-13 DIAGNOSIS — J383 Other diseases of vocal cords: Secondary | ICD-10-CM

## 2023-02-13 DIAGNOSIS — J4520 Mild intermittent asthma, uncomplicated: Secondary | ICD-10-CM | POA: Diagnosis not present

## 2023-02-13 DIAGNOSIS — R0982 Postnasal drip: Secondary | ICD-10-CM | POA: Diagnosis not present

## 2023-02-13 MED ORDER — EPINEPHRINE 0.3 MG/0.3ML IJ SOAJ: 0.3000 mg | INTRAMUSCULAR | 2 refills | Status: AC | PRN

## 2023-02-13 MED ORDER — IPRATROPIUM BROMIDE 0.06 % NA SOLN: NASAL | 5 refills | Status: DC

## 2023-02-13 MED ORDER — LEVOCETIRIZINE DIHYDROCHLORIDE 5 MG PO TABS: 5.0000 mg | ORAL_TABLET | Freq: Every evening | ORAL | 1 refills | Status: AC

## 2023-02-13 MED ORDER — ALBUTEROL SULFATE HFA 108 (90 BASE) MCG/ACT IN AERS: 2.0000 | INHALATION_SPRAY | Freq: Four times a day (QID) | RESPIRATORY_TRACT | 3 refills | Status: DC | PRN

## 2023-02-13 NOTE — Progress Notes (Signed)
Follow-up Note  RE: Dana Vasquez MRN: [id number] DOB: May 17, 1966 Date of Office Visit: 02/13/2023   History of present illness: Dana Vasquez is a 57 y.o. female presenting today for sore throat.  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  She states she keeps getting sore throat that is so bad and feels like she is swallowing razor blades.  She has to throat clear all the time. She has gone to UC for this.  She has been on at least 4 different antibiotics courses over the past year for this. She states she has a lot of drainage and thick yellow mucus.  She states the UC always states she has some type of infection but does rule-out flu, strep, covid everytime.  This affects her breathing and at times has felt choked up or have shortness of breath.  She also states at times when talking she just all of a sudden has to stop talking.  She states sometimes she has had laryngitis.  She states symptoms seem to worsen about 8 months ago.   The last time she has the severe throat pain was around Feb 2024.  Since then she states PCP referred her to ENT.  She had rhinoscopy done and states per pt her vocal cords weren't expanding the way they should.  She then was referred to a VCD ENT specialist and she started speech therapy.  She states the speech therapy has been helpful.   2 weeks ago she had upper endoscopy and colonoscopy which per pt was normal.   She is she has not used any nasal sprays for drainage.  Not currently using any antihistamine.    The lip swelling she had was related to antibiotic (sulfa).     She is eating shellfish/fish in the diet and has not had any issues with this.    Review of systems: Review of Systems  Constitutional: Negative.   HENT:         See HPI  Eyes: Negative.   Respiratory:         See HPI   Cardiovascular: Negative.   Gastrointestinal: Negative.   Musculoskeletal: Negative.   Skin: Negative.   Allergic/Immunologic: Negative.   Neurological: Negative.      All other systems negative unless noted above in HPI  Past medical/social/surgical/family history have been reviewed and are unchanged unless specifically indicated below.  No changes  Medication List: Current Outpatient Medications  Medication Sig Dispense Refill   atorvastatin (LIPITOR) 10 MG tablet Take 1 tablet by mouth daily. 2-3 times a week     ipratropium (ATROVENT) 0.06 % nasal spray 1-2 sprays in each nostril up to three times per day as needed for runny nose/post nasal drip 15 mL 5   levothyroxine (SYNTHROID) 112 MCG tablet TAKE 1 TABLET(112 MCG) BY MOUTH DAILY 90 tablet 1   pantoprazole (PROTONIX) 20 MG tablet Take by mouth.     albuterol (VENTOLIN HFA) 108 (90 Base) MCG/ACT inhaler Inhale 2 puffs into the lungs every 6 (six) hours as needed for wheezing or shortness of breath. 54 g 3   cetirizine (ZYRTEC) 10 MG tablet Take 1 tablet (10 mg total) by mouth daily. (Patient not taking: Reported on 02/13/2023) 90 tablet 2   EPINEPHrine (AUVI-Q) 0.3 mg/0.3 mL IJ SOAJ injection Inject 0.3 mg into the  muscle as needed for anaphylaxis. As directed for life-threatening allergic reactions 2 each 2   levocetirizine (XYZAL) 5 MG tablet Take 1 tablet (5 mg total) by mouth every evening. 90 tablet 1   No current facility-administered medications for this visit.     Known medication allergies: Allergies  Allergen Reactions   Sulfa Antibiotics Swelling   Amoxicillin Other (See Comments)    Headache Has patient had a PCN reaction causing immediate rash, facial/tongue/throat swelling, SOB or lightheadedness with hypotension: No Has patient had a PCN reaction causing severe rash involving mucus membranes or skin necrosis: No Has patient had a PCN reaction that required hospitalization: No Has patient had a PCN reaction  occurring within the last 10 years: Unknown If all of the above answers are "NO", then may proceed with Cephalosporin use.    Nitrofurantoin Monohyd Macro Other (See Comments)    Pt stated she gets a bad yeast infection   Dexilant [Dexlansoprazole]     "bad stomachache"   Meloxicam     Upset stomach and stomach pains   Budesonide Rash   Formoterol Rash   Formoterol Fumarate Rash     Physical examination: Blood pressure 130/86, pulse 70, temperature 98.1 F (36.7 C), resp. rate 16, height 5\' 4"  (1.626 m), weight 233 lb 3.2 oz (105.8 kg), last menstrual period 04/07/2014, SpO2 98 %.  General: Alert, interactive, in no acute distress. HEENT: PERRLA, TMs pearly gray, turbinates minimally edematous with clear discharge, post-pharynx non erythematous. Neck: Supple without lymphadenopathy. Lungs: Clear to auscultation without wheezing, rhonchi or rales. {no increased work of breathing. CV: Normal S1, S2 without murmurs. Abdomen: Nondistended, nontender. Skin: Warm and dry, without lesions or rashes. Extremities:  No clubbing, cyanosis or edema. Neuro:   Grossly intact.  Diagnositics/Labs:  Spirometry: FEV1: 1.88L 83%, FVC: 2.28L 81%, ratio consistent with nonobstructive pattern   Assessment and plan:   Sore throat  You have component of nasal drainage that is likely worsening symptoms as well as diagnosed with VCD (see below).   Laryngeal spasm/vocal cord dysfunction (VCD): - upper airway spasm (wheezing/shortness of breath arising from the upper chest/throat, lack of wheezing, symptoms not worse at night, sudden onset with rapid recovery, inconsistent/absent/immediate response to albuterol and/or other asthma inhalers, lack of other allergy symptoms, age of onset, difficulty with inhalation as opposed to exhalation all speak against asthma as a cause of symptoms) - uncontrolled reflux and post-nasal drainage can make VCD worse and can even be the sole cause of symptoms - start  Atrovent (ipratropium) 1-2 sprays in each nostril up to three times per day as needed for runny nose/post nasal drip (if your nose becomes too dry, please use less) - continue VCD exercises first for breathing symptoms discussed with the ENT speech therapist - for symptoms not improved with breathing exercises, ok to use Albuterol   Asthma Continue albuterol 2 puffs once every 4 hours or albuterol 1 vial via nebulizer as needed for cough or wheeze You may use albuterol 2 puffs 5 to 15 minutes before activity to decrease cough or wheeze  Allergic rhinitis Continue dust mite and weed pollen  Continue allergen avoidance measures directed toward dust mite and weed pollen. Allergen avoidance measures are listed below Continue Levocetirizine (Xyzal) 5mg  or Fexofenadine (Allegra) 180mg  daily during pollen season If medications do not control your symptoms, consider allergen immunotherapy  Angioedema Continue medication avoidance (sulfa)   Follow up in 3-4 months or sooner if needed.  I appreciate the opportunity to take  part in Laelia's care. Please do not hesitate to contact me with questions.  Sincerely,   Margo Aye, MD Allergy/Immunology [facility]

## 2023-02-13 NOTE — Patient Instructions (Addendum)
Sore throat  You have component of nasal drainage that is likely worsening symptoms as well as diagnosed with VCD (see below) on exam.   Laryngeal spasm/vocal cord dysfunction (VCD): - upper airway spasm (wheezing/shortness of breath arising from the upper chest/throat, lack of wheezing, symptoms not worse at night, sudden onset with rapid recovery, inconsistent/absent/immediate response to albuterol and/or other asthma inhalers, lack of other allergy symptoms, age of onset, difficulty with inhalation as opposed to exhalation all speak against asthma as a cause of symptoms) - uncontrolled reflux and post-nasal drainage can make VCD worse and can even be the sole cause of symptoms - start Atrovent (ipratropium) 1-2 sprays in each nostril up to three times per day as needed for runny nose/post nasal drip (if your nose becomes too dry, please use less) - continue VCD exercises first for breathing symptoms discussed with the ENT speech therapist - for symptoms not improved with breathing exercises, ok to use Albuterol   Asthma Continue albuterol 2 puffs once every 4 hours or albuterol 1 vial via nebulizer as needed for cough or wheeze You may use albuterol 2 puffs 5 to 15 minutes before activity to decrease cough or wheeze  Allergic rhinitis Continue dust mite and weed pollen  Continue allergen avoidance measures directed toward dust mite and weed pollen. Allergen avoidance measures are listed below Continue Levocetirizine (Xyzal)  or Fexofenadine (Allegra)  daily during pollen season If medications do not control your symptoms, consider allergen immunotherapy  Angioedema Continue medication avoidance (sulfa)   Follow up in 3-4 months or sooner if needed.

## 2023-02-25 ENCOUNTER — Ambulatory Visit: Payer: BC Managed Care – PPO | Admitting: Podiatry

## 2023-02-25 DIAGNOSIS — M722 Plantar fascial fibromatosis: Secondary | ICD-10-CM | POA: Diagnosis not present

## 2023-02-25 MED ORDER — METHYLPREDNISOLONE 4 MG PO TBPK: ORAL_TABLET | ORAL | 0 refills | Status: DC

## 2023-02-25 MED ORDER — TRIAMCINOLONE ACETONIDE 40 MG/ML IJ SUSP
20.0000 mg | Freq: Once | INTRAMUSCULAR | Status: AC
Start: 2023-02-25 — End: 2023-02-25
  Administered 2023-02-25: 20 mg

## 2023-02-25 NOTE — Progress Notes (Signed)
Chief today ongoing Planter fasciitis pain states he was doing good for quite some time and then started back up again.  She states that the pain feels worse at night and with throbbing pain is even starting to affect her knee at some point on now.  Objective: Vital signs are stable she is alert and oriented x 3.  Pulses are palpable she has pain on palpation medial calcaneal tubercle of the right heel.  She also has some tenderness on palpation of the tibialis anterior tendon right.  Assessment: Tibialis anterior tendinitis and Planter fasciitis.  Plan: Discussed etiology pathology conservative versus surgical therapies at this point I injected the right heel today start her on methylprednisolone follow-up with her in 1 month.

## 2023-03-24 ENCOUNTER — Encounter: Payer: Self-pay | Admitting: Internal Medicine

## 2023-03-25 ENCOUNTER — Other Ambulatory Visit: Payer: Self-pay | Admitting: Internal Medicine

## 2023-03-25 ENCOUNTER — Encounter: Payer: Self-pay | Admitting: Internal Medicine

## 2023-03-25 DIAGNOSIS — R7303 Prediabetes: Secondary | ICD-10-CM

## 2023-03-25 DIAGNOSIS — Z0000 Encounter for general adult medical examination without abnormal findings: Secondary | ICD-10-CM

## 2023-03-27 ENCOUNTER — Ambulatory Visit: Payer: BC Managed Care – PPO | Admitting: Podiatry

## 2023-04-03 ENCOUNTER — Other Ambulatory Visit: Payer: Self-pay | Admitting: Internal Medicine

## 2023-04-04 ENCOUNTER — Ambulatory Visit: Payer: BC Managed Care – PPO | Admitting: Hematology and Oncology

## 2023-04-04 ENCOUNTER — Other Ambulatory Visit: Payer: BC Managed Care – PPO

## 2023-04-15 ENCOUNTER — Ambulatory Visit (INDEPENDENT_AMBULATORY_CARE_PROVIDER_SITE_OTHER): Payer: BC Managed Care – PPO | Admitting: Internal Medicine

## 2023-04-15 ENCOUNTER — Encounter: Payer: Self-pay | Admitting: Internal Medicine

## 2023-04-15 VITALS — BP 124/80 | HR 86 | Temp 99.2°F | Ht 64.0 in | Wt 226.6 lb

## 2023-04-15 DIAGNOSIS — D472 Monoclonal gammopathy: Secondary | ICD-10-CM | POA: Diagnosis not present

## 2023-04-15 DIAGNOSIS — E890 Postprocedural hypothyroidism: Secondary | ICD-10-CM | POA: Diagnosis not present

## 2023-04-15 DIAGNOSIS — E7800 Pure hypercholesterolemia, unspecified: Secondary | ICD-10-CM

## 2023-04-15 DIAGNOSIS — R7303 Prediabetes: Secondary | ICD-10-CM

## 2023-04-15 DIAGNOSIS — D573 Sickle-cell trait: Secondary | ICD-10-CM

## 2023-04-15 DIAGNOSIS — Z0000 Encounter for general adult medical examination without abnormal findings: Secondary | ICD-10-CM

## 2023-04-15 DIAGNOSIS — E6609 Other obesity due to excess calories: Secondary | ICD-10-CM

## 2023-04-15 DIAGNOSIS — Z6838 Body mass index (BMI) 38.0-38.9, adult: Secondary | ICD-10-CM

## 2023-04-15 MED ORDER — MICROLET LANCETS MISC: 2 refills | Status: AC

## 2023-04-15 MED ORDER — CONTOUR TEST VI STRP: ORAL_STRIP | 2 refills | Status: AC

## 2023-04-15 NOTE — Patient Instructions (Signed)

## 2023-04-15 NOTE — Progress Notes (Signed)
Subjective:    Patient ID: Dana Vasquez , female    DOB: Oct 17, 1966 , 57 y.o.   MRN: [id number]  Chief Complaint  Patient presents with   Annual Exam    HPI  She is here today for a full physical examination. She is followed by Henreitta Leber for her GYN care. She reports compliance with meds. She denies having any headaches, chest pain and shortness of breath.   She reports that she recently seeing an oncologist a [facility] in [place]. She was diagnosed with monoclonal gammopathy of undetermined significance. she also reports having some knee pain and gaining weight rapidly about 25 pounds to be exact. She reports her daughter was recently diagnosed with Graves Disease. She also reports having shoulder pain. She stated she was told she had torn rotator cuff in the past but she has done exercises to help with the pain but now it is back. She reports she stopped taking the atorvastatin due to leg cramps. She would like a different med if it is still needed.  Letters sent to for eye exam and mammogram.      Past Medical History:  Diagnosis Date   Allergy    Anemia    Arthritis    knees  AND SHOULDERS   Asthma    allergy related - rarely uses inhaler   Bruises easily    Bunion    Cancer (HCC)    1995;thyroid cancer, SURGERY AND RADIOACTIVE IODINE   Ectopic pregnancy    Euthyroid    Fatigue    Fibromyalgia    GERD (gastroesophageal reflux disease)    Headache(784.0)    otc med prn - last one 02/2014   History of blood transfusion 2000   In [place], [place] at [place] - ? 4 units transfused   Hx of adenomatous and sessile serrated colonic polyps 12/24/2022   Diminutive ssp and adenoma 3/24 - recall 2031   Hypothyroidism    Knee pain    Plantar fasciitis    Sleep apnea    uses CPAP   SVD (spontaneous vaginal delivery)    x 3   Thyroid cancer (HCC)    per pt 1995, thyroid was removed   Thyroid disease    thyroidectomy 1995     Family History  Problem Relation Age of Onset    Hypertension Mother    Cancer Mother        uterine   Cancer Father    Cancer Brother        stomach; 40   Early death Brother    Stroke Maternal Uncle    Heart disease Maternal Uncle    Food Allergy Daughter    Allergic rhinitis Daughter    Allergic rhinitis Daughter    Angioedema Neg Hx    Asthma Neg Hx    Eczema Neg Hx    Immunodeficiency Neg Hx    Urticaria Neg Hx    Atopy Neg Hx    Colon cancer Neg Hx    Rectal cancer Neg Hx      Current Outpatient Medications:    albuterol (VENTOLIN HFA) 108 (90 Base) MCG/ACT inhaler, Inhale 2 puffs into the lungs every 6 (six) hours as needed for wheezing or shortness of breath., Disp: 54 g, Rfl: 3   EPINEPHrine (AUVI-Q) 0.3 mg/0.3 mL IJ SOAJ injection, Inject 0.3 mg into the muscle as needed for anaphylaxis. As directed for life-threatening allergic reactions, Disp: 2 each, Rfl: 2   glucose blood (CONTOUR TEST)  test strip, Use as directed to check blood sugars once daily E11.69, Disp: 100 each, Rfl: 2   ipratropium (ATROVENT) 0.06 % nasal spray, 1-2 sprays in each nostril up to three times per day as needed for runny nose/post nasal drip, Disp: 15 mL, Rfl: 5   levocetirizine (XYZAL) 5 MG tablet, Take 1 tablet (5 mg total) by mouth every evening., Disp: 90 tablet, Rfl: 1   levothyroxine (SYNTHROID) 112 MCG tablet, TAKE 1 TABLET(112 MCG) BY MOUTH DAILY, Disp: 90 tablet, Rfl: 1   Microlet Lancets MISC, Use as directed to check blood sugars once daily E11.69, Disp: 100 each, Rfl: 2   atorvastatin (LIPITOR) 10 MG tablet, Take 1 tablet by mouth daily. 2-3 times a week (Patient not taking: Reported on 04/15/2023), Disp: , Rfl:    Allergies  Allergen Reactions   Sulfa Antibiotics Swelling   Amoxicillin Other (See Comments)    Headache Has patient had a PCN reaction causing immediate rash, facial/tongue/throat swelling, SOB or lightheadedness with hypotension: No Has patient had a PCN reaction causing severe rash involving mucus membranes or  skin necrosis: No Has patient had a PCN reaction that required hospitalization: No Has patient had a PCN reaction occurring within the last 10 years: Unknown If all of the above answers are "NO", then may proceed with Cephalosporin use.    Nitrofurantoin Monohyd Macro Other (See Comments)    Pt stated she gets a bad yeast infection   Dexilant [Dexlansoprazole]     "bad stomachache"   Meloxicam     Upset stomach and stomach pains   Budesonide Rash   Formoterol Rash   Formoterol Fumarate Rash      The patient states she uses post menopausal status for birth control. Patient's last menstrual period was 04/07/2014.. Negative for Dysmenorrhea. Negative for: breast discharge, breast lump(s), breast pain and breast self exam. Associated symptoms include abnormal vaginal bleeding. Pertinent negatives include abnormal bleeding (hematology), anxiety, decreased libido, depression, difficulty falling sleep, dyspareunia, history of infertility, nocturia, sexual dysfunction, sleep disturbances, urinary incontinence, urinary urgency, vaginal discharge and vaginal itching. Diet regular.The patient states her exercise level is  intermittent.  . The patient's tobacco use is:  Social History   Tobacco Use  Smoking Status Never  Smokeless Tobacco Never  . She has been exposed to passive smoke. The patient's alcohol use is:  Social History   Substance and Sexual Activity  Alcohol Use Yes   Alcohol/week: 0.0 standard drinks of alcohol   Comment: occ    Review of Systems  Constitutional: Negative.   HENT: Negative.    Eyes: Negative.   Respiratory: Negative.    Cardiovascular: Negative.   Gastrointestinal: Negative.   Endocrine: Negative.   Genitourinary: Negative.   Musculoskeletal: Negative.   Skin: Negative.   Allergic/Immunologic: Negative.   Neurological: Negative.   Hematological: Negative.   Psychiatric/Behavioral: Negative.       Today's Vitals   04/15/23 1001  BP: 124/80   Pulse: 86  Temp: 99.2 F (37.3 C)  Weight: 226 lb 9.6 oz (102.8 kg)  Height: 5\' 4"  (1.626 m)  PainSc: 4   PainLoc: Knee   Body mass index is 38.9 kg/m.  Wt Readings from Last 3 Encounters:  04/15/23 226 lb 9.6 oz (102.8 kg)  02/13/23 233 lb 3.2 oz (105.8 kg)  01/22/23 221 lb (100.2 kg)     Objective:  Physical Exam Vitals and nursing note reviewed.  Constitutional:      Appearance: Normal appearance.  HENT:  Head: Normocephalic and atraumatic.     Right Ear: Tympanic membrane, ear canal and external ear normal.     Left Ear: Tympanic membrane, ear canal and external ear normal.     Nose: Nose normal.     Mouth/Throat:     Mouth: Mucous membranes are moist.     Pharynx: Oropharynx is clear.  Eyes:     Extraocular Movements: Extraocular movements intact.     Conjunctiva/sclera: Conjunctivae normal.     Pupils: Pupils are equal, round, and reactive to light.  Cardiovascular:     Rate and Rhythm: Normal rate and regular rhythm.     Pulses: Normal pulses.     Heart sounds: Normal heart sounds.  Pulmonary:     Effort: Pulmonary effort is normal.     Breath sounds: Normal breath sounds.  Chest:  Breasts:    Tanner Score is 5.     Comments: She kept her bra on Abdominal:     General: Abdomen is flat. Bowel sounds are normal.     Palpations: Abdomen is soft.  Genitourinary:    Comments: deferred Musculoskeletal:        General: Normal range of motion.     Cervical back: Normal range of motion and neck supple.  Skin:    General: Skin is warm and dry.     Comments: Scarring left foot  Neurological:     General: No focal deficit present.     Mental Status: She is alert and oriented to person, place, and time.  Psychiatric:        Mood and Affect: Mood normal.        Behavior: Behavior normal.         Assessment And Plan:     1. Encounter for general adult medical examination without abnormal findings Comments: A full exam was performed. Importance of  monthly self breast exams was discussed with the patient.  PATIENT IS ADVISED TO GET 30-45 MINUTES REGULAR EXERCISE NO LESS THAN FOUR TO FIVE DAYS PER WEEK - BOTH WEIGHTBEARING EXERCISES AND AEROBIC ARE RECOMMENDED.  PATIENT IS ADVISED TO FOLLOW A HEALTHY DIET WITH AT LEAST SIX FRUITS/VEGGIES PER DAY, DECREASE INTAKE OF RED MEAT, AND TO INCREASE FISH INTAKE TO TWO DAYS PER WEEK.  MEATS/FISH SHOULD NOT BE FRIED, BAKED OR BROILED IS PREFERABLE.  IT IS ALSO IMPORTANT TO CUT BACK ON YOUR SUGAR INTAKE. PLEASE AVOID ANYTHING WITH ADDED SUGAR, CORN SYRUP OR OTHER SWEETENERS. IF YOU MUST USE A SWEETENER, YOU CAN TRY  STEVIA. IT IS ALSO IMPORTANT TO AVOID ARTIFICIALLY SWEETENERS AND DIET BEVERAGES. LASTLY, I SUGGEST WEARING SPF 50 SUNSCREEN ON EXPOSED PARTS AND ESPECIALLY WHEN IN THE DIRECT SUNLIGHT FOR AN EXTENDED PERIOD OF TIME.  PLEASE AVOID FAST FOOD RESTAURANTS AND INCREASE YOUR WATER INTAKE. - Lipid panel - Hemoglobin A1c - TSH + free T4 - CMP14+EGFR - Insulin, random(561)  2. Pure hypercholesterolemia Comments: Chronic, she has stopped atorvastatin due to leg cramps. We discussed the use of cardiac calcium scoring as part of cardiac risk assessment. I will check lipid panel today. Pt is aware of $99 fee.   3. Prediabetes Comments: Chronic, DM cured w/ bariatric surgery. Controlled w/o meds. She will f/uin 4-6 months.  4. Postoperative hypothyroidism Comments: Chronic, I will check thyroid panel and adjust meds as needed. - TSH + free T4  5. Monoclonal gammopathy Comments: Recently diagnosed in the past year.  [facility] note reviewed in full detail.  6. Sickle cell trait (HCC) Comments:  Chronic, encouraged to stay well hydrated.  7. Class 2 severe obesity due to excess calories with serious comorbidity and body mass index (BMI) of 38.0 to 38.9 in adult [facility]) Comments: She is encouraged to incorporate more exercise into her daily routine, while initially striving for BMI<30 to  decrease cardiac risk. - Amb Ref to Medical Weight Management    Return in 6 months (on 10/15/2023), or predm check, for 1 year physical. Patient was given opportunity to ask questions. Patient verbalized understanding of the plan and was able to repeat key elements of the plan. All questions were answered to their satisfaction.    I, Gwynneth Aliment, MD, have reviewed all documentation for this visit. The documentation on 04/15/23 for the exam, diagnosis, procedures, and orders are all accurate and complete.

## 2023-04-16 LAB — CMP14+EGFR
ALT: 11 IU/L (ref 0–32)
AST: 11 IU/L (ref 0–40)
Albumin: 4.1 g/dL (ref 3.8–4.9)
Alkaline Phosphatase: 99 IU/L (ref 44–121)
BUN/Creatinine Ratio: 18 (ref 9–23)
BUN: 18 mg/dL (ref 6–24)
Bilirubin Total: 0.2 mg/dL (ref 0.0–1.2)
CO2: 26 mmol/L (ref 20–29)
Calcium: 9.5 mg/dL (ref 8.7–10.2)
Chloride: 104 mmol/L (ref 96–106)
Creatinine, Ser: 1.01 mg/dL — ABNORMAL HIGH (ref 0.57–1.00)
Globulin, Total: 3.2 g/dL (ref 1.5–4.5)
Glucose: 100 mg/dL — ABNORMAL HIGH (ref 70–99)
Potassium: 4 mmol/L (ref 3.5–5.2)
Sodium: 143 mmol/L (ref 134–144)
Total Protein: 7.3 g/dL (ref 6.0–8.5)
eGFR: 66 mL/min/{1.73_m2} (ref >59)

## 2023-04-16 LAB — TSH+FREE T4
Free T4: 1.3 ng/dL (ref 0.82–1.77)
TSH: 1.91 u[IU]/mL (ref 0.450–4.500)

## 2023-04-16 LAB — HEMOGLOBIN A1C
Est. average glucose Bld gHb Est-mCnc: 117 mg/dL
Hgb A1c MFr Bld: 5.7 % — ABNORMAL HIGH (ref 4.8–5.6)

## 2023-04-16 LAB — LIPID PANEL
Chol/HDL Ratio: 3.1 ratio (ref 0.0–4.4)
Cholesterol, Total: 223 mg/dL — ABNORMAL HIGH (ref 100–199)
HDL: 72 mg/dL (ref >39)
LDL Chol Calc (NIH): 140 mg/dL — ABNORMAL HIGH (ref 0–99)
Triglycerides: 65 mg/dL (ref 0–149)
VLDL Cholesterol Cal: 11 mg/dL (ref 5–40)

## 2023-04-16 LAB — INSULIN, RANDOM: INSULIN: 13.9 u[IU]/mL (ref 2.6–24.9)

## 2023-04-21 ENCOUNTER — Encounter: Payer: Self-pay | Admitting: Internal Medicine

## 2023-04-22 ENCOUNTER — Other Ambulatory Visit: Payer: Self-pay | Admitting: Internal Medicine

## 2023-04-22 DIAGNOSIS — E7800 Pure hypercholesterolemia, unspecified: Secondary | ICD-10-CM

## 2023-04-22 NOTE — Progress Notes (Signed)
Card card

## 2023-05-07 ENCOUNTER — Ambulatory Visit (HOSPITAL_COMMUNITY)
Admission: RE | Admit: 2023-05-07 | Discharge: 2023-05-07 | Disposition: A | Discharge status: Home / Self Care | Payer: BC Managed Care – PPO | Source: Ambulatory Visit | Attending: Internal Medicine | Admitting: Internal Medicine

## 2023-05-07 DIAGNOSIS — E7800 Pure hypercholesterolemia, unspecified: Secondary | ICD-10-CM | POA: Insufficient documentation

## 2023-05-15 ENCOUNTER — Ambulatory Visit: Payer: BC Managed Care – PPO | Admitting: Allergy

## 2023-05-15 ENCOUNTER — Encounter: Payer: Self-pay | Admitting: Allergy

## 2023-05-15 ENCOUNTER — Other Ambulatory Visit: Payer: Self-pay

## 2023-05-15 VITALS — BP 110/70 | HR 57 | Temp 98.0°F | Resp 18

## 2023-05-15 DIAGNOSIS — J4520 Mild intermittent asthma, uncomplicated: Secondary | ICD-10-CM

## 2023-05-15 DIAGNOSIS — J383 Other diseases of vocal cords: Secondary | ICD-10-CM

## 2023-05-15 DIAGNOSIS — R0982 Postnasal drip: Secondary | ICD-10-CM | POA: Diagnosis not present

## 2023-05-15 DIAGNOSIS — J029 Acute pharyngitis, unspecified: Secondary | ICD-10-CM

## 2023-05-15 DIAGNOSIS — J3089 Other allergic rhinitis: Secondary | ICD-10-CM | POA: Diagnosis not present

## 2023-05-15 MED ORDER — IPRATROPIUM BROMIDE 0.06 % NA SOLN: NASAL | 5 refills | Status: DC

## 2023-05-15 NOTE — Patient Instructions (Addendum)
Sore throat  Improved some since last visit Component of nasal drainage that is worsening symptoms as well as with VCD Laryngeal spasm/vocal cord dysfunction (VCD): - upper airway spasm (wheezing/shortness of breath arising from the upper chest/throat, lack of wheezing, symptoms not worse at night, sudden onset with rapid recovery, inconsistent/absent/immediate response to albuterol and/or other asthma inhalers, lack of other allergy symptoms, age of onset, difficulty with inhalation as opposed to exhalation all speak against asthma as a cause of symptoms) - uncontrolled reflux and post-nasal drainage can make VCD worse and can even be the sole cause of symptoms - continue  Atrovent (ipratropium) 1-2 sprays in each nostril up to three times per day as needed for runny nose/post nasal drip (if your nose becomes too dry, please use less) - continue VCD exercises first for breathing symptoms discussed with the ENT speech therapist - for symptoms not improved with breathing exercises, ok to use Albuterol   Asthma - Continue albuterol 2 puffs once every 4 hours or albuterol 1 vial via nebulizer as needed for cough or wheeze - You may use albuterol 2 puffs 5 to 15 minutes before activity to decrease cough or wheeze - If increase in albuterol then will need to consider adding in controller medication like Singulair Breathing control goals:  Full participation in all desired activities (may need albuterol before activity) Albuterol use two time or less a week on average (not counting use with activity) Cough interfering with sleep two time or less a month Oral steroids no more than once a year No hospitalizations  Allergic rhinitis - Continue dust mite and weed pollen avoidance measures   -Continue Levocetirizine (Xyzal) 5mg  daily at this time.   If needing to change Xyzal would change to Allegra - If medications do not control your symptoms, consider allergen immunotherapy   Follow up in  about 4  months or sooner if needed.

## 2023-05-15 NOTE — Progress Notes (Signed)
Follow-up Note  RE: Dana Vasquez MRN: [id number] DOB: 07/11/1966 Date of Office Visit: 05/15/2023   History of present illness: Dana Vasquez is a 57 y.o. female presenting today for follow-up of sore throat with VCD, allergic rhinitis, intermittent asthma.  She was last seen in the office on 02/13/23 by myself.  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  At this time monitoring only.    She feels like the sore throat is better but she can feel when it is coming on.  She is having less frequent episodes.  She states the breathing techniques do help.  She has gone through sessions with speech therapy and has completed this.  She does utilize the therapies that she has received from them.  She does feel like the Xyzal helps with her nasal drainage but it does make her a bit drowsy the next day.  She does state if she takes it earlier in the evening she has less drowsiness the following day.  She is not doing any work during the summer months because she is okay with keeping with the Xyzal at this time.  The nasal Atrovent spray also is helpful and she does need a refill of this to help with her drainage control.  With her breathing as she does report using albuterol 2-3 times a week average however states there is some weeks that she will need to use it at all.  This may use it about 5-6 times a month.  She does feel that majority of her respiratory symptoms seem to originate in the throat area.  She does get relief of symptoms with albuterol sometimes.  Review of systems: 10 point review of systems negative unless noted above  Past medical/social/surgical/family history have been reviewed and are unchanged unless specifically indicated below.  No changes  Medication List: Current Outpatient Medications  Medication Sig Dispense Refill   albuterol (VENTOLIN HFA) 108 (90 Base) MCG/ACT inhaler  Inhale 2 puffs into the lungs every 6 (six) hours as needed for wheezing or shortness of breath. 54 g 3   EPINEPHrine (AUVI-Q) 0.3 mg/0.3 mL IJ SOAJ injection Inject 0.3 mg into the muscle as needed for anaphylaxis. As directed for life-threatening allergic reactions 2 each 2   glucose blood (CONTOUR TEST) test strip Use as directed to check blood sugars once daily E11.69 100 each 2   levocetirizine (XYZAL) 5 MG tablet Take 1 tablet (5 mg total) by mouth every evening. 90 tablet 1   levothyroxine (SYNTHROID) 112 MCG tablet TAKE 1 TABLET(112 MCG) BY MOUTH DAILY 90 tablet 1   Microlet Lancets MISC Use as directed to check blood sugars once daily E11.69 100 each 2   atorvastatin (LIPITOR) 10 MG tablet Take 1 tablet by mouth daily. 2-3 times a week (Patient not taking: Reported on 04/15/2023)     ipratropium (ATROVENT) 0.06 % nasal spray 1-2 sprays in each nostril up to three times per day as needed for runny nose/post nasal drip 15 mL 5   No current facility-administered medications for this visit.     Known medication allergies: Allergies  Allergen Reactions   Sulfa Antibiotics Swelling   Amoxicillin Other (See Comments)    Headache Has patient had a PCN reaction causing immediate rash, facial/tongue/throat swelling, SOB or lightheadedness with hypotension: No Has patient had a PCN reaction causing severe rash involving mucus membranes or skin  necrosis: No Has patient had a PCN reaction that required hospitalization: No Has patient had a PCN reaction occurring within the last 10 years: Unknown If all of the above answers are "NO", then may proceed with Cephalosporin use.    Nitrofurantoin Monohyd Macro Other (See Comments)    Pt stated she gets a bad yeast infection   Dexilant [Dexlansoprazole]     "bad stomachache"   Meloxicam     Upset stomach and stomach pains   Budesonide Rash   Formoterol Rash   Formoterol Fumarate Rash     Physical examination: Blood pressure 110/70, pulse (!)  57, temperature 98 F (36.7 C), temperature source Temporal, resp. rate 18, last menstrual period 04/07/2014, SpO2 100%.  General: Alert, interactive, in no acute distress. HEENT: PERRLA, TMs pearly gray, turbinates non-edematous without discharge, post-pharynx non erythematous. Neck: Supple without lymphadenopathy. Lungs: Clear to auscultation without wheezing, rhonchi or rales. {no increased work of breathing. CV: Normal S1, S2 without murmurs. Abdomen: Nondistended, nontender. Skin: Warm and dry, without lesions or rashes. Extremities:  No clubbing, cyanosis or edema. Neuro:   Grossly intact.  Diagnositics/Labs: None today  Assessment and plan: Sore throat  Improved some since last visit Component of nasal drainage that is worsening symptoms as well as with VCD Laryngeal spasm/vocal cord dysfunction (VCD): - upper airway spasm (wheezing/shortness of breath arising from the upper chest/throat, lack of wheezing, symptoms not worse at night, sudden onset with rapid recovery, inconsistent/absent/immediate response to albuterol and/or other asthma inhalers, lack of other allergy symptoms, age of onset, difficulty with inhalation as opposed to exhalation all speak against asthma as a cause of symptoms) - uncontrolled reflux and post-nasal drainage can make VCD worse and can even be the sole cause of symptoms - continue  Atrovent (ipratropium) 1-2 sprays in each nostril up to three times per day as needed for runny nose/post nasal drip (if your nose becomes too dry, please use less) - continue VCD exercises first for breathing symptoms discussed with the ENT speech therapist - for symptoms not improved with breathing exercises, ok to use Albuterol   Asthma - Continue albuterol 2 puffs once every 4 hours or albuterol 1 vial via nebulizer as needed for cough or wheeze - You may use albuterol 2 puffs 5 to 15 minutes before activity to decrease cough or wheeze - If increase in albuterol then  will need to consider adding in controller medication like Singulair Breathing control goals:  Full participation in all desired activities (may need albuterol before activity) Albuterol use two time or less a week on average (not counting use with activity) Cough interfering with sleep two time or less a month Oral steroids no more than once a year No hospitalizations  Allergic rhinitis with PND - Continue dust mite and weed pollen avoidance measures   -Continue Levocetirizine (Xyzal) 5mg  daily at this time.   If needing to change Xyzal would change to Allegra - If medications do not control your symptoms, consider allergen immunotherapy   Follow up in  about 4 months or sooner if needed.  I appreciate the opportunity to take part in Tuyen's care. Please do not hesitate to contact me with questions.  Sincerely,   Margo Aye, MD Allergy/Immunology [facility]

## 2023-06-15 ENCOUNTER — Encounter: Payer: Self-pay | Admitting: Internal Medicine

## 2023-07-08 ENCOUNTER — Ambulatory Visit (INDEPENDENT_AMBULATORY_CARE_PROVIDER_SITE_OTHER): Payer: BC Managed Care – PPO | Admitting: Podiatry

## 2023-07-08 DIAGNOSIS — M722 Plantar fascial fibromatosis: Secondary | ICD-10-CM | POA: Diagnosis not present

## 2023-07-08 MED ORDER — TRIAMCINOLONE ACETONIDE 40 MG/ML IJ SUSP
20.0000 mg | Freq: Once | INTRAMUSCULAR | Status: AC
Start: 2023-07-08 — End: 2023-07-08
  Administered 2023-07-08: 20 mg

## 2023-07-09 NOTE — Progress Notes (Signed)
She presents today for heel pain on the right side.  States my heel has been hurting me nonstop I treated it by wearing the boot but that really did not help.  Objective: Severe pain on palpation medial calcaneal tubercle of the right heel.  Chronic intractable Planter fasciitis.  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  Plan: I injected 20 mg Kenalog 5 mg Marcaine point maximal tenderness.  Discussed appropriate shoe gear and I will follow-up with her on an as-needed basis.

## 2023-07-22 ENCOUNTER — Other Ambulatory Visit: Payer: Self-pay

## 2023-07-22 ENCOUNTER — Other Ambulatory Visit: Payer: Self-pay | Admitting: Sports Medicine

## 2023-07-22 DIAGNOSIS — M5416 Radiculopathy, lumbar region: Secondary | ICD-10-CM

## 2023-07-22 DIAGNOSIS — C9000 Multiple myeloma not having achieved remission: Secondary | ICD-10-CM

## 2023-07-22 MED ORDER — LEVOTHYROXINE SODIUM 112 MCG PO TABS: 112.0000 ug | ORAL_TABLET | Freq: Every day | ORAL | 2 refills | Status: DC

## 2023-07-30 ENCOUNTER — Other Ambulatory Visit: Payer: Self-pay | Admitting: Sports Medicine

## 2023-07-30 ENCOUNTER — Ambulatory Visit
Admission: RE | Admit: 2023-07-30 | Discharge: 2023-07-30 | Disposition: A | Discharge status: Home / Self Care | Payer: BC Managed Care – PPO | Source: Ambulatory Visit | Attending: Sports Medicine | Admitting: Sports Medicine

## 2023-07-30 DIAGNOSIS — M5416 Radiculopathy, lumbar region: Secondary | ICD-10-CM

## 2023-07-30 DIAGNOSIS — E859 Amyloidosis, unspecified: Secondary | ICD-10-CM

## 2023-08-08 ENCOUNTER — Encounter: Payer: Self-pay | Admitting: Podiatry

## 2023-08-08 ENCOUNTER — Telehealth: Payer: Self-pay | Admitting: Podiatry

## 2023-08-08 NOTE — Telephone Encounter (Signed)
Left message that per Dr Geryl Rankins assistant to get you scheduled to see him 10/15 at 800am. I put pt there and left message to call to confirm.

## 2023-08-12 ENCOUNTER — Ambulatory Visit (INDEPENDENT_AMBULATORY_CARE_PROVIDER_SITE_OTHER): Payer: BC Managed Care – PPO

## 2023-08-12 ENCOUNTER — Encounter: Payer: Self-pay | Admitting: Podiatry

## 2023-08-12 ENCOUNTER — Ambulatory Visit (INDEPENDENT_AMBULATORY_CARE_PROVIDER_SITE_OTHER): Payer: BC Managed Care – PPO | Admitting: Podiatry

## 2023-08-12 DIAGNOSIS — M778 Other enthesopathies, not elsewhere classified: Secondary | ICD-10-CM

## 2023-08-12 DIAGNOSIS — S90111A Contusion of right great toe without damage to nail, initial encounter: Secondary | ICD-10-CM | POA: Diagnosis not present

## 2023-08-12 NOTE — Progress Notes (Signed)
She presents today for a numbness and discomfort from about the mid diaphyseal metatarsal area distally she states this top and bottom however she relates injuring her great toe on the right foot not so long ago where it actually broke the skin just behind the nail plate.  She states that since that time she has been getting radiating pain from the distal medial aspect of the hallux up the toe and into the top of the foot.  Past medical history is remarkable for multiple myeloma and back pain.  Objective: Vital signs are stable alert and oriented x 3.  Pulses are palpable.  She has no significant finding other than numbness and loss of light sensation to the forefoot dorsal plantar and digital areas.  She has no reproducible pain on palpation of the hallux though radiographically it does appear to be sclerotic at the level of the interphalangeal joint but no other significant abnormalities.  See no obvious osseous bone lesions.  Assessment probable contusion of the toe with neuritis right hallux.  Cannot rule out radiculopathy and sciatic issues related to her back which would be referred to her right foot for numbness.  This is unilateral and it is just her right side of her back has been bothering her.  Plan: At this point I recommended that she follow-up with her MRI for her back and notify us with questions or concerns may need to consider neurology neck stop.

## 2023-08-17 ENCOUNTER — Encounter: Payer: Self-pay | Admitting: Podiatry

## 2023-08-21 ENCOUNTER — Encounter: Payer: Self-pay | Admitting: Podiatry

## 2023-08-21 ENCOUNTER — Ambulatory Visit (INDEPENDENT_AMBULATORY_CARE_PROVIDER_SITE_OTHER): Payer: BC Managed Care – PPO

## 2023-08-21 ENCOUNTER — Ambulatory Visit (INDEPENDENT_AMBULATORY_CARE_PROVIDER_SITE_OTHER): Payer: BC Managed Care – PPO | Admitting: Podiatry

## 2023-08-21 DIAGNOSIS — M778 Other enthesopathies, not elsewhere classified: Secondary | ICD-10-CM

## 2023-08-21 DIAGNOSIS — T148XXA Other injury of unspecified body region, initial encounter: Secondary | ICD-10-CM

## 2023-08-21 DIAGNOSIS — M7671 Peroneal tendinitis, right leg: Secondary | ICD-10-CM

## 2023-08-21 NOTE — Progress Notes (Signed)
Subjective:   Patient ID: Dana Vasquez, female   DOB: 57 y.o.   MRN: [id number]   HPI Patient presents stating she felt a pop in her right plantar foot and has a lot of pain in that portion of the foot and now also in the outside of the foot as she is walking differently.  She is wearing a boot which helps to a certain degree but cannot wear this boot all the time   ROS      Objective:  Physical Exam  Neurovascular status intact with inflammation pain of the plantar arch right with what appears to be a tear of the plantar fascial band.  Quite sore when pressed within this area.  Also has discomfort around peroneal tendon but that appears to be more compensatory     Assessment:  Probability for acute tear of the plantar fascia right with peroneal inflammation     Plan:  H&P reviewed and went ahead today discussed care discussed peroneal tendinitis probable compensatory and continued boot usage and I dispensed night splint that I want her to use to sleep been and for periods of time with aggressive ice.  This should heal uneventfully but will probably take 8 to 12 weeks to heal and hopefully in the next 3 to 4 weeks she will be back into shoe gear  X-rays were negative for signs of fracture appears to be soft tissue

## 2023-08-31 DIAGNOSIS — J302 Other seasonal allergic rhinitis: Secondary | ICD-10-CM | POA: Insufficient documentation

## 2023-08-31 DIAGNOSIS — J4520 Mild intermittent asthma, uncomplicated: Secondary | ICD-10-CM | POA: Insufficient documentation

## 2023-09-04 ENCOUNTER — Telehealth: Payer: Self-pay

## 2023-09-04 NOTE — Transitions of Care (Post Inpatient/ED Visit) (Signed)
09/04/2023  Name: Dana Vasquez MRN: [id number] DOB: May 15, 1966  Today's TOC FU Call Status: Today's TOC FU Call Status:: Successful TOC FU Call Completed TOC FU Call Complete Date: 09/04/23 Patient's Name and Date of Birth confirmed.  Transition Care Management Follow-up Telephone Call Date of Discharge: 09/03/23 Discharge Facility: Other [facility]) Name of Other (Non[facility]) Discharge Facility: [facility] Type of Discharge: Inpatient Admission Primary Inpatient Discharge Diagnosis:: Diverticulitis of intestine with abscess without bleeding, unspecified part of intestinal tract How have you been since you were released from the hospital?: Better Any questions or concerns?: No  Items Reviewed: Did you receive and understand the discharge instructions provided?: Yes Medications obtained,verified, and reconciled?: Yes (Medications Reviewed) Any new allergies since your discharge?: No Dietary orders reviewed?: Yes Type of Diet Ordered:: now short foods. Do you have support at home?: Yes People in Home: child(ren), adult  Medications Reviewed Today: Medications Reviewed Today     Reviewed by Marlyn Corporal, CMA (Certified Medical Assistant) on 09/04/23 at 1024  Med List Status: <None>   Medication Order Taking? Sig Documenting Provider Last Dose Status Informant  albuterol (VENTOLIN HFA) 108 (90 Base) MCG/ACT inhaler [id number] Yes Inhale 2 puffs into the lungs every 6 (six) hours as needed for wheezing or shortness of breath. Marcelyn Bruins, MD Taking Active   atorvastatin (LIPITOR) 10 MG tablet [id number] Yes Take 1 tablet by mouth daily. 2-3 times a week [provider] Taking Active   EPINEPHrine (AUVI-Q) 0.3 mg/0.3 mL IJ SOAJ injection [id number] Yes Inject 0.3 mg into the muscle as needed for anaphylaxis. As directed for life-threatening allergic reactions Padgett, Pilar Grammes, MD Taking Active   glucose blood (CONTOUR TEST)  test strip [id number] Yes Use as directed to check blood sugars once daily E11.69 Dorothyann Peng, MD Taking Active   ipratropium (ATROVENT) 0.06 % nasal spray [id number] Yes 1-2 sprays in each nostril up to three times per day as needed for runny nose/post nasal drip Marcelyn Bruins, MD Taking Active   levocetirizine (XYZAL) 5 MG tablet [id number] Yes Take 1 tablet (5 mg total) by mouth every evening. Marcelyn Bruins, MD Taking Active   levothyroxine (SYNTHROID) 112 MCG tablet [id number] Yes Take 1 tablet (112 mcg total) by mouth daily before breakfast. Dorothyann Peng, MD Taking Active   Microlet Lancets MISC [id number] Yes Use as directed to check blood sugars once daily E11.69 Dorothyann Peng, MD Taking Active   naproxen (NAPROSYN) 500 MG tablet [id number] Yes Take 500 mg by mouth 2 (two) times daily as needed. [provider] Taking Active   Med List Note Halford Decamp, CPhT 04/08/18 1526): CPAP AT BEDTIME.            Home Care and Equipment/Supplies: Were Home Health Services Ordered?: No Any new equipment or medical supplies ordered?: No  Functional Questionnaire: Do you need assistance with bathing/showering or dressing?: No Do you need assistance with meal preparation?: No Do you need assistance with eating?: No Do you have difficulty maintaining continence: No Do you need assistance with getting out of bed/getting out of a chair/moving?: No Do you have difficulty managing or taking your medications?: No  Follow up appointments reviewed: PCP Follow-up appointment confirmed?: Yes Date of PCP follow-up appointment?: 09/11/23 Follow-up Provider: sanders, robyn Specialist [facility] Follow-up appointment confirmed?: No Do you need transportation to your follow-up appointment?: No Do you understand care options if your condition(s) worsen?: Yes-patient verbalized understanding    SIGNATURE Lisabeth Devoid, CMA

## 2023-09-11 ENCOUNTER — Ambulatory Visit: Payer: BC Managed Care – PPO | Admitting: Podiatry

## 2023-09-11 ENCOUNTER — Ambulatory Visit (INDEPENDENT_AMBULATORY_CARE_PROVIDER_SITE_OTHER): Payer: BC Managed Care – PPO

## 2023-09-11 ENCOUNTER — Encounter: Payer: Self-pay | Admitting: Podiatry

## 2023-09-11 ENCOUNTER — Ambulatory Visit: Payer: BC Managed Care – PPO | Admitting: Internal Medicine

## 2023-09-11 ENCOUNTER — Encounter: Payer: Self-pay | Admitting: Internal Medicine

## 2023-09-11 VITALS — BP 118/80 | HR 80 | Temp 98.4°F | Ht 64.0 in | Wt 222.6 lb

## 2023-09-11 DIAGNOSIS — S93492A Sprain of other ligament of left ankle, initial encounter: Secondary | ICD-10-CM | POA: Diagnosis not present

## 2023-09-11 DIAGNOSIS — K5780 Diverticulitis of intestine, part unspecified, with perforation and abscess without bleeding: Secondary | ICD-10-CM | POA: Diagnosis not present

## 2023-09-11 DIAGNOSIS — Z85850 Personal history of malignant neoplasm of thyroid: Secondary | ICD-10-CM

## 2023-09-11 DIAGNOSIS — R600 Localized edema: Secondary | ICD-10-CM | POA: Diagnosis not present

## 2023-09-11 DIAGNOSIS — M779 Enthesopathy, unspecified: Secondary | ICD-10-CM

## 2023-09-11 DIAGNOSIS — S93402D Sprain of unspecified ligament of left ankle, subsequent encounter: Secondary | ICD-10-CM

## 2023-09-11 DIAGNOSIS — Z2821 Immunization not carried out because of patient refusal: Secondary | ICD-10-CM

## 2023-09-11 DIAGNOSIS — Z8639 Personal history of other endocrine, nutritional and metabolic disease: Secondary | ICD-10-CM

## 2023-09-11 DIAGNOSIS — Z6838 Body mass index (BMI) 38.0-38.9, adult: Secondary | ICD-10-CM

## 2023-09-11 DIAGNOSIS — D472 Monoclonal gammopathy: Secondary | ICD-10-CM | POA: Diagnosis not present

## 2023-09-11 DIAGNOSIS — W108XXS Fall (on) (from) other stairs and steps, sequela: Secondary | ICD-10-CM

## 2023-09-11 DIAGNOSIS — E66812 Obesity, class 2: Secondary | ICD-10-CM

## 2023-09-11 NOTE — Progress Notes (Signed)
I,Dana Vasquez, CMA,acting as a Neurosurgeon for Gwynneth Aliment, MD.,have documented all relevant documentation on the behalf of Gwynneth Aliment, MD,as directed by  Gwynneth Aliment, MD while in the presence of Gwynneth Aliment, MD.  Subjective:  Patient ID: Dana Vasquez , female    DOB: [birth year] , 57 y.o.   MRN: [id number]  Chief Complaint  Patient presents with   Hospitalization Follow-up    HPI  Patient presents today for hospital follow up. She was admitted to a [facility] hospital (in [place]) on 08/31/2023 for acute abdominal pain.  She was discharged on 09/03/2023. Today she reports her stomach feels a lot better.   Dana Vasquez is a 57 y.o. female with past medical history significant for asthma, GERD, seasonal allergies, history of thyroid cancer status post thyroidectomy, hypothyroidism, MGUS, who presented to the ED at [facility] complaining of abdominal pain. Her pain started the night prior at around 8:30 PM. It was described as sharp, cramping, located mainly in the periumbilical area and the lower quadrants and estimated at 10/10 of intensity. She reports some nausea but denies any vomiting. She also had some chills, but denied having any fever. Due to worsening abdominal pain, she presented to the ED for further medical evaluation.  She states she was given fluids and antibiotics with improvement in her symptoms.  She also reports spraining her left ankle the day prior to going to the hospital. She states this was not addressed during her hospitalization.  She was advised that it was not broken.  She is concerned because she is still having some discomfort.  She has yet to reach out to her podiatrist about this.     She reports having partial hysterectomy in 2015. She reports she will call to schedule mammogram.        Past Medical History:  Diagnosis Date   Allergy    Anemia    Arthritis    knees  AND SHOULDERS   Asthma    allergy related - rarely uses inhaler    Bruises easily    Bunion    Cancer (HCC)    1995;thyroid cancer, SURGERY AND RADIOACTIVE IODINE   Ectopic pregnancy    Euthyroid    Fatigue    Fibromyalgia    GERD (gastroesophageal reflux disease)    Headache(784.0)    otc med prn - last one 02/2014   History of blood transfusion 2000   In [place], [place] at [place] - ? 4 units transfused   Hx of adenomatous and sessile serrated colonic polyps 12/24/2022   Diminutive ssp and adenoma 3/24 - recall 2031   Hypothyroidism    Knee pain    Plantar fasciitis    Sleep apnea    uses CPAP   SVD (spontaneous vaginal delivery)    x 3   Thyroid cancer (HCC)    per pt 1995, thyroid was removed   Thyroid disease    thyroidectomy 1995     Family History  Problem Relation Age of Onset   Hypertension Mother    Cancer Mother        uterine   Cancer Father    Cancer Brother        stomach; 51   Early death Brother    Stroke Maternal Uncle    Heart disease Maternal Uncle    Food Allergy Daughter    Allergic rhinitis Daughter    Allergic rhinitis Daughter    Angioedema Neg Hx  Asthma Neg Hx    Eczema Neg Hx    Immunodeficiency Neg Hx    Urticaria Neg Hx    Atopy Neg Hx    Colon cancer Neg Hx    Rectal cancer Neg Hx      Current Outpatient Medications:    albuterol (VENTOLIN HFA) 108 (90 Base) MCG/ACT inhaler, Inhale 2 puffs into the lungs every 6 (six) hours as needed for wheezing or shortness of breath., Disp: 54 g, Rfl: 3   atorvastatin (LIPITOR) 10 MG tablet, Take 1 tablet by mouth daily. 2-3 times a week (Patient not taking: Reported on 09/18/2023), Disp: , Rfl:    EPINEPHrine (AUVI-Q) 0.3 mg/0.3 mL IJ SOAJ injection, Inject 0.3 mg into the muscle as needed for anaphylaxis. As directed for life-threatening allergic reactions, Disp: 2 each, Rfl: 2   glucose blood (CONTOUR TEST) test strip, Use as directed to check blood sugars once daily E11.69 (Patient not taking: Reported on 09/18/2023), Disp: 100 each, Rfl: 2    ipratropium (ATROVENT) 0.06 % nasal spray, 1-2 sprays in each nostril up to three times per day as needed for runny nose/post nasal drip, Disp: 15 mL, Rfl: 5   levocetirizine (XYZAL) 5 MG tablet, Take 1 tablet (5 mg total) by mouth every evening., Disp: 90 tablet, Rfl: 1   levothyroxine (SYNTHROID) 112 MCG tablet, Take 1 tablet (112 mcg total) by mouth daily before breakfast., Disp: 90 tablet, Rfl: 2   Microlet Lancets MISC, Use as directed to check blood sugars once daily E11.69 (Patient not taking: Reported on 09/18/2023), Disp: 100 each, Rfl: 2   naproxen (NAPROSYN) 500 MG tablet, Take 500 mg by mouth 2 (two) times daily as needed. (Patient not taking: Reported on 09/18/2023), Disp: , Rfl:    acetaminophen (TYLENOL) 325 MG tablet, Take by mouth., Disp: , Rfl:    amoxicillin-clavulanate (AUGMENTIN) 875-125 MG tablet, Take 1 tablet by mouth 2 (two) times daily. (Patient not taking: Reported on 09/18/2023), Disp: , Rfl:    ciprofloxacin (CIPRO) 500 MG tablet, Take by mouth., Disp: , Rfl:    diazepam (VALIUM) 5 MG tablet, Take by mouth. (Patient not taking: Reported on 09/18/2023), Disp: , Rfl:    famotidine (PEPCID) 10 MG tablet, Take 10 mg by mouth daily., Disp: , Rfl:    Lactobacillus Acid-Pectin (ACIDOPHILUS/CITRUS PECTIN) TABS, Take by mouth., Disp: , Rfl:    Lactobacillus Acid-Pectin (ACIDOPHILUS/PECTIN) CAPS, Take 1 capsule by mouth 2 (two) times daily., Disp: , Rfl:    metroNIDAZOLE (FLAGYL) 500 MG tablet, Take by mouth., Disp: , Rfl:    pantoprazole (PROTONIX) 20 MG tablet, Take by mouth. (Patient not taking: Reported on 09/18/2023), Disp: , Rfl:    pantoprazole (PROTONIX) 40 MG tablet, Take by mouth. (Patient not taking: Reported on 09/18/2023), Disp: , Rfl:    Allergies  Allergen Reactions   Sulfa Antibiotics Swelling   Amoxicillin Other (See Comments)    Headache Has patient had a PCN reaction causing immediate rash, facial/tongue/throat swelling, SOB or lightheadedness with  hypotension: No Has patient had a PCN reaction causing severe rash involving mucus membranes or skin necrosis: No Has patient had a PCN reaction that required hospitalization: No Has patient had a PCN reaction occurring within the last 10 years: Unknown If all of the above answers are "NO", then may proceed with Cephalosporin use.    Nitrofurantoin Monohyd Macro Other (See Comments)    Pt stated she gets a bad yeast infection   Dexilant [Dexlansoprazole]     "bad  stomachache"   Meloxicam     Upset stomach and stomach pains   Budesonide Rash   Formoterol Rash   Formoterol Fumarate Rash     Review of Systems  Constitutional: Negative.   Respiratory: Negative.    Cardiovascular: Negative.   Gastrointestinal:  Positive for abdominal pain.  Neurological: Negative.   Psychiatric/Behavioral: Negative.       Today's Vitals   09/11/23 1124  BP: 118/80  Pulse: 80  Temp: 98.4 F (36.9 C)  SpO2: 98%  Weight: 222 lb 9.6 oz (101 kg)  Height: 5\' 4"  (1.626 m)   Body mass index is 38.21 kg/m.  Wt Readings from Last 3 Encounters:  09/18/23 221 lb 14.4 oz (100.7 kg)  09/11/23 222 lb 9.6 oz (101 kg)  04/15/23 226 lb 9.6 oz (102.8 kg)     Objective:  Physical Exam Vitals and nursing note reviewed.  Constitutional:      Appearance: Normal appearance. She is obese.  HENT:     Head: Normocephalic and atraumatic.  Eyes:     Extraocular Movements: Extraocular movements intact.  Cardiovascular:     Rate and Rhythm: Normal rate and regular rhythm.     Heart sounds: Normal heart sounds.  Pulmonary:     Effort: Pulmonary effort is normal.     Breath sounds: Normal breath sounds.  Abdominal:     General: Bowel sounds are normal. There is no distension.     Palpations: Abdomen is soft.     Tenderness: There is abdominal tenderness.  Musculoskeletal:     Cervical back: Normal range of motion.  Skin:    General: Skin is warm.  Neurological:     General: No focal deficit present.      Mental Status: She is alert.  Psychiatric:        Mood and Affect: Mood normal.        Behavior: Behavior normal.         Assessment And Plan:  Diverticulitis of intestine with abscess without bleeding, unspecified part of intestinal tract Assessment & Plan: She has known prior diagnosis of diverticulosis. Admits eating popcorn frequently prior to admission.  I will refer her to GI for f/u. She has completed full course of abx.  Encouraged to stay hydrated and avoid nuts/seeds as counseled at discharge.   TCM PERFORMED. A MEMBER OF THE CLINICAL TEAM ([facility] care mgmt performed TCM call on 09/04/23) SPOKE WITH THE PATIENT UPON DISCHARGE. DISCHARGE SUMMARY WAS REVIEWED IN FULL DETAIL DURING THE VISIT. MEDS RECONCILED AND COMPARED TO DISCHARGE MEDS. MEDICATION LIST WAS UPDATED AND REVIEWED WITH THE PATIENT. GREATER THAN 50% FACE TO FACE TIME WAS SPENT IN COUNSELING AND COORDINATION OF CARE. ALL QUESTIONS WERE ANSWERED TO THE SATISFACTION OF THE PATIENT.    Orders: -     Microalbumin / creatinine urine ratio -     Ambulatory referral to Gastroenterology  Sprain of left ankle, unspecified ligament, subsequent encounter Assessment & Plan: Occurred on 11/2 after a fall.  She is encouraged to f/u with Podiatry for radiographic studies and further evaluation.    Fall (on) (from) other stairs and steps, sequela Assessment & Plan: The fall occurred on 11/2, slipped on her daughter's steps. She states she slid down the steps on her bottom, her left leg/ankle hit the steps. She has had pain/swelling ever since. Encouraged to hold onto railing when climbing/going down steps.    Smoldering multiple myeloma Assessment & Plan: Recently diagnosed at [facility]. Most recent Notes reviewed. I  will check renal function today. Encouraged to stay well hydrated.    Orders: -     BMP8+eGFR  Class 2 severe obesity due to excess calories with serious comorbidity and body mass index (BMI) of 38.0 to 38.9 in adult  [facility]) Assessment & Plan: She is encouraged to strive for BMI less than 30 to decrease cardiac risk. Advised to aim for at least 150 minutes of exercise per week.    History of thyroid cancer -     Thyroglobulin antibody  Immunization declined  History of uncontrolled diabetes -     Hemoglobin A1c  Other orders -     Specimen status report  She is encouraged to strive for BMI less than 30 to decrease cardiac risk. Advised to aim for at least 150 minutes of exercise per week.   Return if symptoms worsen or fail to improve.  Patient was given opportunity to ask questions. Patient verbalized understanding of the plan and was able to repeat key elements of the plan. All questions were answered to their satisfaction.    I, Gwynneth Aliment, MD, have reviewed all documentation for this visit. The documentation on 09/21/23 for the exam, diagnosis, procedures, and orders are all accurate and complete.   IF YOU HAVE BEEN REFERRED TO A SPECIALIST, IT MAY TAKE 1-2 WEEKS TO SCHEDULE/PROCESS THE REFERRAL. IF YOU HAVE NOT HEARD FROM US/SPECIALIST IN TWO WEEKS, PLEASE GIVE [place] A CALL AT [phone].   THE PATIENT IS ENCOURAGED TO PRACTICE SOCIAL DISTANCING DUE TO THE COVID-19 PANDEMIC.

## 2023-09-11 NOTE — Patient Instructions (Signed)
Diverticulitis  Diverticulitis is when small pouches in your colon get infected or swollen. This causes pain in your belly (abdomen) and watery poop (diarrhea). The small pouches are called diverticula. They may form if you have a condition called diverticulosis. What are the causes? You may get this condition if poop (stool) gets trapped in the pouches in your colon. The poop lets germs (bacteria) grow. This causes an infection. What increases the risk? You are more likely to get this condition if you have small pouches in your colon. You are also more likely to get it if: You are overweight or very overweight (obese). You do not exercise enough. You drink alcohol. You smoke. You eat a lot of red meat, like beef, pork, or lamb. You do not eat enough fiber. You are older than 56 years of age. What are the signs or symptoms? Pain in your belly. Pain is often on the left side, but it may be felt in other spots too. Fever and chills. Feeling like you may vomit. Vomiting. Having cramps. Feeling full. Changes in how often you poop. Blood in your poop. How is this treated? Most cases are treated at home. You may be told to: Take over-the-counter pain medicines. Only eat and drink clear liquids. Take antibiotics. Rest. Very bad cases may need to be treated at a hospital. Treatment may include: Not eating or drinking. Taking pain medicines. Getting antibiotics through an IV tube. Getting fluid and food through an IV tube. Having surgery. When you are feeling better, you may need to have a test to look at your colon (colonoscopy). Follow these instructions at home: Medicines Take over-the-counter and prescription medicines only as told by your doctor. These include: Fiber pills. Probiotics. Medicines to make your poop soft (stool softeners). If you were prescribed antibiotics, take them as told by your doctor. Do not stop taking them even if you start to feel better. Ask your  doctor if you should avoid driving or using machines while you are taking your medicine. Eating and drinking  Follow the diet told by your doctor. You may need to only eat and drink liquids. When you feel better, you may be able to eat more foods. You may also be told to eat a lot of fiber. Fiber helps you poop. Foods with fiber include berries, beans, lentils, and green vegetables. Try not to eat red meat. General instructions Do not smoke or use any products that contain nicotine or tobacco. If you need help quitting, ask your doctor. Exercise 3 or more times a week. Try to go for 30 minutes each time. Exercise enough to sweat and make your heart beat faster. Contact a doctor if: Your pain gets worse. You are not pooping like normal. Your symptoms do not get better. Your symptoms get worse very fast. You have a fever. You vomit more than one time. You have poop that is: Bloody. Black. Tarry. This information is not intended to replace advice given to you by your health care provider. Make sure you discuss any questions you have with your health care provider. Document Revised: 07/11/2022 Document Reviewed: 07/11/2022 Elsevier Patient Education  2024 [employer].

## 2023-09-12 ENCOUNTER — Encounter: Payer: Self-pay | Admitting: Internal Medicine

## 2023-09-12 LAB — BMP8+EGFR
BUN/Creatinine Ratio: 14 (ref 9–23)
BUN: 13 mg/dL (ref 6–24)
CO2: 24 mmol/L (ref 20–29)
Calcium: 9.1 mg/dL (ref 8.7–10.2)
Chloride: 106 mmol/L (ref 96–106)
Creatinine, Ser: 0.95 mg/dL (ref 0.57–1.00)
Glucose: 85 mg/dL (ref 70–99)
Potassium: 4.4 mmol/L (ref 3.5–5.2)
Sodium: 145 mmol/L — ABNORMAL HIGH (ref 134–144)
eGFR: 70 mL/min/{1.73_m2} (ref >59)

## 2023-09-12 LAB — HEMOGLOBIN A1C
Est. average glucose Bld gHb Est-mCnc: 126 mg/dL
Hgb A1c MFr Bld: 6 % — ABNORMAL HIGH (ref 4.8–5.6)

## 2023-09-12 LAB — MICROALBUMIN / CREATININE URINE RATIO
Creatinine, Urine: 82.7 mg/dL
Microalb/Creat Ratio: 16 mg/g{creat} (ref 0–29)
Microalbumin, Urine: 12.9 ug/mL

## 2023-09-12 LAB — THYROGLOBULIN ANTIBODY: Thyroglobulin Antibody: <1 [IU]/mL (ref 0.0–0.9)

## 2023-09-12 NOTE — Progress Notes (Signed)
Subjective:   Patient ID: Dana Vasquez, female   DOB: 57 y.o.   MRN: [id number]   HPI Patient states she slipped and fell and turned her left foot she is very worried that she might of broken something.  She has been taking Tylenol and been very swollen   ROS      Objective:  Physical Exam  Neurovascular status found to be intact negat Homan test was noted quite a bit of swelling on the lateral side of the left ankle and foot +2 pitting that is very tender when pressed with difficulty with range of motion due to the tenderness     Assessment:  Significant sprain to the left ankle cannot rule out fracture     Plan:  H&P reviewed discussed swelling discussed x-rays and at this point I went ahead and I applied Unna boot to help reduce the swelling along with Ace wrap elevation and surgical shoe.  Patient will be seen back as symptoms indicate over the next 3 to 4 weeks  X-rays indicate that there is no signs of a fracture or diastases injury currently

## 2023-09-15 LAB — SPECIMEN STATUS REPORT

## 2023-09-18 ENCOUNTER — Encounter: Payer: Self-pay | Admitting: Allergy

## 2023-09-18 ENCOUNTER — Ambulatory Visit (INDEPENDENT_AMBULATORY_CARE_PROVIDER_SITE_OTHER): Payer: BC Managed Care – PPO | Admitting: Allergy

## 2023-09-18 VITALS — BP 120/70 | HR 64 | Temp 97.7°F | Ht 64.0 in | Wt 221.9 lb

## 2023-09-18 DIAGNOSIS — J3089 Other allergic rhinitis: Secondary | ICD-10-CM | POA: Diagnosis not present

## 2023-09-18 DIAGNOSIS — J383 Other diseases of vocal cords: Secondary | ICD-10-CM

## 2023-09-18 DIAGNOSIS — J029 Acute pharyngitis, unspecified: Secondary | ICD-10-CM

## 2023-09-18 DIAGNOSIS — J4520 Mild intermittent asthma, uncomplicated: Secondary | ICD-10-CM | POA: Diagnosis not present

## 2023-09-18 DIAGNOSIS — R0982 Postnasal drip: Secondary | ICD-10-CM

## 2023-09-18 NOTE — Progress Notes (Signed)
Follow-up Note  RE: Dana Vasquez MRN: [id number] DOB: 08/08/1966 Date of Office Visit: 09/18/2023   History of present illness: Dana Vasquez is a 57 y.o. female presenting today for follow-up of sore throat with history of VCD, allergic rhinitis with PND, asthma.  She was last seen in the office 05/15/2023 by myself.  Discussed the use of AI scribe software for clinical note transcription with the patient, who gave verbal consent to proceed.  Since the last visit in July, the patient reported intermittent episodes of respiratory distress, characterized by a choking sensation and difficulty speaking. These episodes were more frequent a month or two ago, during the fall season. The patient found relief from these symptoms by using a rescue inhaler and performing breathing exercises learned from voice lessons.  The patient also reported episodes of chest tightness and sharp pain, which have occurred several times over the years. During a recent hospitalization for diverticulitis, the patient experienced significant chest pressure, leading to multiple EKGs, which did not reveal any abnormalities. The patient wondered if these chest symptoms could be related to her respiratory issues.  The patient reported a weight loss of about eight pounds due to dietary restrictions following the diverticulitis flare.  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  She also recently sustained a sprained ankle ligament.   The patient uses a rescue inhaler, levocetirizine, and a nasal spray during episodes of respiratory distress and allergies. However, the patient reported that levocetirizine made her very sleepy, so it was not used daily.    Review of systems: 10pt ROS negative unless noted above in HPI  All other systems negative unless noted above in HPI  Past medical/social/surgical/family history have been reviewed and are unchanged unless specifically  indicated below.  No changes  Medication List: Current Outpatient Medications  Medication Sig Dispense Refill   acetaminophen (TYLENOL) 325 MG tablet Take by mouth.     albuterol (VENTOLIN HFA) 108 (90 Base) MCG/ACT inhaler Inhale 2 puffs into the lungs every 6 (six) hours as needed for wheezing or shortness of breath. 54 g 3   ciprofloxacin (CIPRO) 500 MG tablet Take by mouth.     EPINEPHrine (AUVI-Q) 0.3 mg/0.3 mL IJ SOAJ injection Inject 0.3 mg into the muscle as needed for anaphylaxis. As directed for life-threatening allergic reactions 2 each 2   famotidine (PEPCID) 10 MG tablet Take 10 mg by mouth daily.     ipratropium (ATROVENT) 0.06 % nasal spray 1-2 sprays in each nostril up to three times per day as needed for runny nose/post nasal drip 15 mL 5   Lactobacillus Acid-Pectin (ACIDOPHILUS/CITRUS PECTIN) TABS Take by mouth.     Lactobacillus Acid-Pectin (ACIDOPHILUS/PECTIN) CAPS Take 1 capsule by mouth 2 (two) times daily.     levocetirizine (XYZAL) 5 MG tablet Take 1 tablet (5 mg total) by mouth every evening. 90 tablet 1   levothyroxine (SYNTHROID) 112 MCG tablet Take 1 tablet (112 mcg total) by mouth daily before breakfast. 90 tablet 2   metroNIDAZOLE (FLAGYL) 500 MG tablet Take by mouth.     amoxicillin-clavulanate (AUGMENTIN) 875-125 MG tablet Take 1 tablet by mouth 2 (two) times daily. (Patient not taking: Reported on 09/18/2023)     atorvastatin (LIPITOR) 10 MG tablet Take 1 tablet by mouth daily. 2-3 times a week (Patient not taking: Reported on 09/18/2023)     diazepam (VALIUM) 5 MG tablet Take by mouth. (Patient not taking: Reported on 09/18/2023)  glucose blood (CONTOUR TEST) test strip Use as directed to check blood sugars once daily E11.69 (Patient not taking: Reported on 09/18/2023) 100 each 2   Microlet Lancets MISC Use as directed to check blood sugars once daily E11.69 (Patient not taking: Reported on 09/18/2023) 100 each 2   naproxen (NAPROSYN) 500 MG tablet Take 500  mg by mouth 2 (two) times daily as needed. (Patient not taking: Reported on 09/18/2023)     pantoprazole (PROTONIX) 20 MG tablet Take by mouth. (Patient not taking: Reported on 09/18/2023)     pantoprazole (PROTONIX) 40 MG tablet Take by mouth. (Patient not taking: Reported on 09/18/2023)     No current facility-administered medications for this visit.     Known medication allergies: Allergies  Allergen Reactions   Sulfa Antibiotics Swelling   Amoxicillin Other (See Comments)    Headache Has patient had a PCN reaction causing immediate rash, facial/tongue/throat swelling, SOB or lightheadedness with hypotension: No Has patient had a PCN reaction causing severe rash involving mucus membranes or skin necrosis: No Has patient had a PCN reaction that required hospitalization: No Has patient had a PCN reaction occurring within the last 10 years: Unknown If all of the above answers are "NO", then may proceed with Cephalosporin use.    Nitrofurantoin Monohyd Macro Other (See Comments)    Pt stated she gets a bad yeast infection   Dexilant [Dexlansoprazole]     "bad stomachache"   Meloxicam     Upset stomach and stomach pains   Budesonide Rash   Formoterol Rash   Formoterol Fumarate Rash     Physical examination: Blood pressure 120/70, pulse 64, temperature 97.7 F (36.5 C), height 5\' 4"  (1.626 m), weight 221 lb 14.4 oz (100.7 kg), last menstrual period 04/07/2014, SpO2 99%.  General: Alert, interactive, in no acute distress. HEENT: PERRLA, TMs pearly gray, turbinates non-edematous without discharge, post-pharynx non erythematous. Neck: Supple without lymphadenopathy. Lungs: Clear to auscultation without wheezing, rhonchi or rales. {no increased work of breathing. CV: Normal S1, S2 without murmurs. Abdomen: Nondistended, nontender. Skin: Warm and dry, without lesions or rashes. Extremities:  No clubbing, cyanosis or edema. Neuro:   Grossly intact.  Diagnositics/Labs: Spirometry:  FEV1: 2.18L 97%, FVC: 2.56L 91%, ratio consistent with nonobstructive pattern  Assessment and plan: Sore throat  Component of nasal drainage that worsens symptoms as well as with VCD  Laryngeal spasm/vocal cord dysfunction (VCD): - upper airway spasm (wheezing/shortness of breath arising from the upper chest/throat, lack of wheezing, symptoms not worse at night, sudden onset with rapid recovery, inconsistent/absent/immediate response to albuterol and/or other asthma inhalers, lack of other allergy symptoms, age of onset, difficulty with inhalation as opposed to exhalation all speak against asthma as a cause of symptoms) - uncontrolled reflux and post-nasal drainage can make VCD worse and can even be the sole cause of symptoms - continue  Atrovent (ipratropium) 1-2 sprays in each nostril up to three times per day as needed for runny nose/post nasal drip (if your nose becomes too dry, please use less) - continue VCD exercises first for breathing symptoms as recommended by ENT speech therapy - for symptoms not improved with breathing exercises, ok to use Albuterol   Asthma - Continue albuterol 2 puffs once every 4 hours or albuterol 1 vial via nebulizer as needed for cough or wheeze - You may use albuterol 2 puffs 5 to 15 minutes before activity to decrease cough or wheeze - If increase in albuterol then will need to consider adding  in controller medication like Singulair Breathing control goals:  Full participation in all desired activities (may need albuterol before activity) Albuterol use two time or less a week on average (not counting use with activity) Cough interfering with sleep two time or less a month Oral steroids no more than once a year No hospitalizations  Allergic rhinitis - Continue dust mite and weed pollen avoidance measures   -Continue Levocetirizine (Xyzal) 5mg  daily as needed.   If needing to change Xyzal would change to Allegra - If medications do not control your  symptoms, consider allergen immunotherapy   Follow up in about 6 months or sooner if needed.  I appreciate the opportunity to take part in Tayjah's care. Please do not hesitate to contact me with questions.  Sincerely,   Margo Aye, MD Allergy/Immunology [facility]

## 2023-09-18 NOTE — Patient Instructions (Addendum)
Sore throat  Component of nasal drainage that worsens symptoms as well as with VCD  Laryngeal spasm/vocal cord dysfunction (VCD): - upper airway spasm (wheezing/shortness of breath arising from the upper chest/throat, lack of wheezing, symptoms not worse at night, sudden onset with rapid recovery, inconsistent/absent/immediate response to albuterol and/or other asthma inhalers, lack of other allergy symptoms, age of onset, difficulty with inhalation as opposed to exhalation all speak against asthma as a cause of symptoms) - uncontrolled reflux and post-nasal drainage can make VCD worse and can even be the sole cause of symptoms - continue  Atrovent (ipratropium) 1-2 sprays in each nostril up to three times per day as needed for runny nose/post nasal drip (if your nose becomes too dry, please use less) - continue VCD exercises first for breathing symptoms as recommended by ENT speech therapy - for symptoms not improved with breathing exercises, ok to use Albuterol   Asthma - Continue albuterol 2 puffs once every 4 hours or albuterol 1 vial via nebulizer as needed for cough or wheeze - You may use albuterol 2 puffs 5 to 15 minutes before activity to decrease cough or wheeze - If increase in albuterol then will need to consider adding in controller medication like Singulair Breathing control goals:  Full participation in all desired activities (may need albuterol before activity) Albuterol use two time or less a week on average (not counting use with activity) Cough interfering with sleep two time or less a month Oral steroids no more than once a year No hospitalizations  Allergic rhinitis - Continue dust mite and weed pollen avoidance measures   -Continue Levocetirizine (Xyzal) 5mg  daily as needed.   If needing to change Xyzal would change to Allegra - If medications do not control your symptoms, consider allergen immunotherapy   Follow up in about 6 months or sooner if needed.

## 2023-09-21 DIAGNOSIS — S93402A Sprain of unspecified ligament of left ankle, initial encounter: Secondary | ICD-10-CM | POA: Insufficient documentation

## 2023-09-21 DIAGNOSIS — W108XXS Fall (on) (from) other stairs and steps, sequela: Secondary | ICD-10-CM | POA: Insufficient documentation

## 2023-09-21 NOTE — Assessment & Plan Note (Signed)
Occurred on 11/2 after a fall.  She is encouraged to f/u with Podiatry for radiographic studies and further evaluation.

## 2023-09-21 NOTE — Assessment & Plan Note (Signed)
The fall occurred on 11/2, slipped on her daughter's steps. She states she slid down the steps on her bottom, her left leg/ankle hit the steps. She has had pain/swelling ever since. Encouraged to hold onto railing when climbing/going down steps.

## 2023-09-21 NOTE — Assessment & Plan Note (Signed)
She has known prior diagnosis of diverticulosis. Admits eating popcorn frequently prior to admission.  I will refer her to GI for f/u. She has completed full course of abx.  Encouraged to stay hydrated and avoid nuts/seeds as counseled at discharge.   TCM PERFORMED. A MEMBER OF THE CLINICAL TEAM ([facility] care mgmt performed TCM call on 09/04/23) SPOKE WITH THE PATIENT UPON DISCHARGE. DISCHARGE SUMMARY WAS REVIEWED IN FULL DETAIL DURING THE VISIT. MEDS RECONCILED AND COMPARED TO DISCHARGE MEDS. MEDICATION LIST WAS UPDATED AND REVIEWED WITH THE PATIENT. GREATER THAN 50% FACE TO FACE TIME WAS SPENT IN COUNSELING AND COORDINATION OF CARE. ALL QUESTIONS WERE ANSWERED TO THE SATISFACTION OF THE PATIENT.

## 2023-09-21 NOTE — Assessment & Plan Note (Signed)
Recently diagnosed at [facility]. Most recent Notes reviewed. I will check renal function today. Encouraged to stay well hydrated.

## 2023-09-21 NOTE — Assessment & Plan Note (Signed)
She is encouraged to strive for BMI less than 30 to decrease cardiac risk. Advised to aim for at least 150 minutes of exercise per week.

## 2023-09-22 ENCOUNTER — Telehealth: Payer: Self-pay

## 2023-09-22 NOTE — Transitions of Care (Post Inpatient/ED Visit) (Signed)
   09/22/2023  Name: Dana Vasquez MRN: [id number] DOB: January 07, 1966  Today's TOC FU Call Status: Today's TOC FU Call Status:: Successful TOC FU Call Completed TOC FU Call Complete Date: 09/22/23 Patient's Name and Date of Birth confirmed.  Transition Care Management Follow-up Telephone Call Date of Discharge: 09/16/23 Discharge Facility: Other ([facility]) Name of Other ([facility]) [facility]charge Facility: [facility] Type of Discharge: Emergency Department Primary Inpatient Discharge Diagnosis:: Diverticulitis Reason for ED Visit: Other: How have you been since you were released from the hospital?: Better (still really nauseated and has diarrhea) Any questions or concerns?: Yes Patient Questions/Concerns:: medication concerns  Items Reviewed: Did you receive and understand the discharge instructions provided?: Yes Medications obtained,verified, and reconciled?: Yes (Medications Reviewed) Any new allergies since your discharge?: No Dietary orders reviewed?: Yes Type of Diet Ordered:: low fiber and liquids Do you have support at home?: Yes People in Home: spouse  Medications Reviewed Today: Medications Reviewed Today   Medications were not reviewed in this encounter     Home Care and Equipment/Supplies: Were Home Health Services Ordered?: NA Any new equipment or medical supplies ordered?: NA  Functional Questionnaire: Do you need assistance with bathing/showering or dressing?: No Do you need assistance with meal preparation?: No Do you need assistance with eating?: No Do you have difficulty maintaining continence: No Do you need assistance with getting out of bed/getting out of a chair/moving?: No Do you have difficulty managing or taking your medications?: No  Follow up appointments reviewed: PCP Follow-up appointment confirmed?: Yes Date of PCP follow-up appointment?: 09/23/23 Follow-up Provider: Ellender Hose [facility] Follow-up appointment confirmed?:  Yes Date of Specialist follow-up appointment?:  (doesnt have the appointment scheduled yet.) Do you need transportation to your follow-up appointment?: No Do you understand care options if your condition(s) worsen?: Yes-patient verbalized understanding    SIGNATUREYL,RMA

## 2023-09-23 ENCOUNTER — Ambulatory Visit (INDEPENDENT_AMBULATORY_CARE_PROVIDER_SITE_OTHER): Payer: BC Managed Care – PPO | Admitting: Family Medicine

## 2023-09-23 ENCOUNTER — Encounter: Payer: Self-pay | Admitting: Family Medicine

## 2023-09-23 VITALS — BP 140/92 | HR 86 | Temp 98.1°F | Ht 64.0 in | Wt 216.0 lb

## 2023-09-23 DIAGNOSIS — R1084 Generalized abdominal pain: Secondary | ICD-10-CM

## 2023-09-23 DIAGNOSIS — K5780 Diverticulitis of intestine, part unspecified, with perforation and abscess without bleeding: Secondary | ICD-10-CM

## 2023-09-23 DIAGNOSIS — D573 Sickle-cell trait: Secondary | ICD-10-CM

## 2023-09-23 DIAGNOSIS — E66812 Obesity, class 2: Secondary | ICD-10-CM

## 2023-09-23 DIAGNOSIS — Z6837 Body mass index (BMI) 37.0-37.9, adult: Secondary | ICD-10-CM

## 2023-09-23 DIAGNOSIS — D472 Monoclonal gammopathy: Secondary | ICD-10-CM | POA: Diagnosis not present

## 2023-09-23 DIAGNOSIS — R112 Nausea with vomiting, unspecified: Secondary | ICD-10-CM

## 2023-09-23 MED ORDER — ONDANSETRON 4 MG PO TBDP: 4.0000 mg | ORAL_TABLET | Freq: Three times a day (TID) | ORAL | 1 refills | Status: DC | PRN

## 2023-09-23 NOTE — Progress Notes (Signed)
I,Jameka J Llittleton, CMA,acting as a Neurosurgeon for [employer], NP.,have documented all relevant documentation on the behalf of Ellender Hose, NP,as directed by  Ellender Hose, NP while in the presence of Ellender Hose, NP.  Subjective:  Patient ID: Dana Vasquez , female    DOB: Dec 30, 1965 , 57 y.o.   MRN: [id number]  Chief Complaint  Patient presents with   E/R Follow up    HPI  Patient is a 57 year old female who presents today for a ER follow up, she is here with her husband. She went to the E/R again on 09/16/23 for diverticulitis of intestine with abscess because of the prolonged abdominal pain, nausea, vomiting with diarrhea.She reports today is the first day she feels a little bit okay. She reports she was still been having a lot of diarrhea before now .She reports she feels like the antibotics are causing her to have a nausea and so she stopped both her remaining antibiotics and probiotics.     Past Medical History:  Diagnosis Date   Allergy    Anemia    Arthritis    knees  AND SHOULDERS   Asthma    allergy related - rarely uses inhaler   Bruises easily    Bunion    Cancer (HCC)    1995;thyroid cancer, SURGERY AND RADIOACTIVE IODINE   Ectopic pregnancy    Euthyroid    Fatigue    Fibromyalgia    GERD (gastroesophageal reflux disease)    Headache(784.0)    otc med prn - last one 02/2014   History of blood transfusion 2000   In [place], [place] at [facility] - ? 4 units transfused   Hx of adenomatous and sessile serrated colonic polyps 12/24/2022   Diminutive ssp and adenoma 3/24 - recall 2031   Hypothyroidism    Knee pain    Nausea and vomiting 09/27/2023   Plantar fasciitis    Sleep apnea    uses CPAP   SVD (spontaneous vaginal delivery)    x 3   Thyroid cancer (HCC)    per pt 1995, thyroid was removed   Thyroid disease    thyroidectomy 1995     Family History  Problem Relation Age of Onset   Hypertension Mother    Cancer Mother        uterine   Cancer Father     Cancer Brother        stomach; 40   Early death Brother    Stroke Maternal Uncle    Heart disease Maternal Uncle    Food Allergy Daughter    Allergic rhinitis Daughter    Allergic rhinitis Daughter    Angioedema Neg Hx    Asthma Neg Hx    Eczema Neg Hx    Immunodeficiency Neg Hx    Urticaria Neg Hx    Atopy Neg Hx    Colon cancer Neg Hx    Rectal cancer Neg Hx      Current Outpatient Medications:    acetaminophen (TYLENOL) 325 MG tablet, Take by mouth., Disp: , Rfl:    albuterol (VENTOLIN HFA) 108 (90 Base) MCG/ACT inhaler, Inhale 2 puffs into the lungs every 6 (six) hours as needed for wheezing or shortness of breath., Disp: 54 g, Rfl: 3   EPINEPHrine (AUVI-Q) 0.3 mg/0.3 mL IJ SOAJ injection, Inject 0.3 mg into the muscle as needed for anaphylaxis. As directed for life-threatening allergic reactions, Disp: 2 each, Rfl: 2   famotidine (PEPCID) 10 MG tablet, Take  10 mg by mouth daily., Disp: , Rfl:    glucose blood (CONTOUR TEST) test strip, Use as directed to check blood sugars once daily E11.69, Disp: 100 each, Rfl: 2   ipratropium (ATROVENT) 0.06 % nasal spray, 1-2 sprays in each nostril up to three times per day as needed for runny nose/post nasal drip, Disp: 15 mL, Rfl: 5   Lactobacillus Acid-Pectin (ACIDOPHILUS/PECTIN) CAPS, Take 1 capsule by mouth 2 (two) times daily., Disp: , Rfl:    levocetirizine (XYZAL) 5 MG tablet, Take 1 tablet (5 mg total) by mouth every evening., Disp: 90 tablet, Rfl: 1   levothyroxine (SYNTHROID) 112 MCG tablet, Take 1 tablet (112 mcg total) by mouth daily before breakfast., Disp: 90 tablet, Rfl: 2   Microlet Lancets MISC, Use as directed to check blood sugars once daily E11.69, Disp: 100 each, Rfl: 2   ondansetron (ZOFRAN-ODT) 4 MG disintegrating tablet, Take 1 tablet (4 mg total) by mouth every 8 (eight) hours as needed for nausea or vomiting., Disp: 20 tablet, Rfl: 1   naproxen (NAPROSYN) 500 MG tablet, Take 500 mg by mouth 2 (two) times daily as  needed. (Patient not taking: Reported on 09/18/2023), Disp: , Rfl:    Allergies  Allergen Reactions   Sulfa Antibiotics Swelling   Amoxicillin Other (See Comments)    Headache Has patient had a PCN reaction causing immediate rash, facial/tongue/throat swelling, SOB or lightheadedness with hypotension: No Has patient had a PCN reaction causing severe rash involving mucus membranes or skin necrosis: No Has patient had a PCN reaction that required hospitalization: No Has patient had a PCN reaction occurring within the last 10 years: Unknown If all of the above answers are "NO", then may proceed with Cephalosporin use.    Nitrofurantoin Monohyd Macro Other (See Comments)    Pt stated she gets a bad yeast infection   Dexilant [Dexlansoprazole]     "bad stomachache"   Meloxicam     Upset stomach and stomach pains   Budesonide Rash   Formoterol Rash   Formoterol Fumarate Rash     Review of Systems  Constitutional: Negative.   HENT: Negative.    Eyes: Negative.   Gastrointestinal:  Positive for abdominal pain, diarrhea and nausea.  Musculoskeletal: Negative.   Skin: Negative.   Neurological: Negative.   Psychiatric/Behavioral: Negative.       Today's Vitals   09/23/23 1435 09/23/23 1533  BP: (!) 150/90 (!) 140/92  Pulse: 65 86  Temp: 98.1 F (36.7 C)   Weight: 216 lb (98 kg)   Height: 5\' 4"  (1.626 m)   PainSc: 4    PainLoc: Abdomen    Body mass index is 37.08 kg/m.  Wt Readings from Last 3 Encounters:  09/23/23 216 lb (98 kg)  09/18/23 221 lb 14.4 oz (100.7 kg)  09/11/23 222 lb 9.6 oz (101 kg)    The 10-year ASCVD risk score (Arnett DK, et al., 2019) is: 9.7%   Values used to calculate the score:     Age: 41 years     Sex: Female     Is Non-Hispanic African American: Yes     Diabetic: Yes     Tobacco smoker: No     Systolic Blood Pressure: 140 mmHg     Is BP treated: No     HDL Cholesterol: 72 mg/dL     Total Cholesterol: 223 mg/dL  Objective:  Physical  Exam Pulmonary:     Effort: Pulmonary effort is normal.  Breath sounds: Normal breath sounds.  Abdominal:     General: Bowel sounds are normal.     Tenderness: There is abdominal tenderness.  Skin:    General: Skin is warm and dry.  Neurological:     General: No focal deficit present.     Mental Status: She is alert and oriented to person, place, and time.  Psychiatric:        Mood and Affect: Mood normal.         Assessment And Plan:  Nausea and vomiting, unspecified vomiting type -     Ondansetron; Take 1 tablet (4 mg total) by mouth every 8 (eight) hours as needed for nausea or vomiting.  Dispense: 20 tablet; Refill: 1  Sickle cell trait (HCC)  Diverticulitis of intestine with abscess without bleeding, unspecified part of intestinal tract Assessment & Plan: Stopped her antibiotic; advised to complete the tx plan.   Smoldering multiple myeloma  Class 2 severe obesity due to excess calories with serious comorbidity and body mass index (BMI) of 37.0 to 37.9 in adult [facility]) Assessment & Plan: She is encouraged to strive for BMI less than 30 to decrease cardiac risk. Advised to aim for at least 150 minutes of exercise per week.      Return if symptoms worsen or fail to improve, for keep next appt.  Patient was given opportunity to ask questions. Patient verbalized understanding of the plan and was able to repeat key elements of the plan. All questions were answered to their satisfaction.   I, Ellender Hose, NP, have reviewed all documentation for this visit. The documentation on 09/27/2023 for the exam, diagnosis, procedures, and orders are all accurate and complete.     IF YOU HAVE BEEN REFERRED TO A SPECIALIST, IT MAY TAKE 1-2 WEEKS TO SCHEDULE/PROCESS THE REFERRAL. IF YOU HAVE NOT HEARD FROM US/SPECIALIST IN TWO WEEKS, PLEASE GIVE [place] A CALL AT [phone].

## 2023-09-27 ENCOUNTER — Encounter: Payer: Self-pay | Admitting: Family Medicine

## 2023-09-27 DIAGNOSIS — E66812 Obesity, class 2: Secondary | ICD-10-CM | POA: Insufficient documentation

## 2023-09-27 DIAGNOSIS — R112 Nausea with vomiting, unspecified: Secondary | ICD-10-CM | POA: Insufficient documentation

## 2023-09-27 HISTORY — DX: Nausea with vomiting, unspecified: R11.2

## 2023-09-27 NOTE — Assessment & Plan Note (Signed)
 She is encouraged to strive for BMI less than 30 to decrease cardiac risk. Advised to aim for at least 150 minutes of exercise per week.

## 2023-09-27 NOTE — Assessment & Plan Note (Signed)
Stopped her antibiotic; advised to complete the tx plan.

## 2023-10-06 ENCOUNTER — Encounter: Payer: BC Managed Care – PPO | Admitting: Neurology

## 2023-10-15 ENCOUNTER — Ambulatory Visit: Payer: BC Managed Care – PPO | Admitting: Internal Medicine

## 2023-10-15 ENCOUNTER — Encounter: Payer: Self-pay | Admitting: Internal Medicine

## 2023-10-15 VITALS — BP 124/88 | HR 85 | Temp 98.4°F | Wt 219.6 lb

## 2023-10-15 DIAGNOSIS — R42 Dizziness and giddiness: Secondary | ICD-10-CM

## 2023-10-15 DIAGNOSIS — R7303 Prediabetes: Secondary | ICD-10-CM | POA: Diagnosis not present

## 2023-10-15 DIAGNOSIS — E890 Postprocedural hypothyroidism: Secondary | ICD-10-CM

## 2023-10-15 DIAGNOSIS — D472 Monoclonal gammopathy: Secondary | ICD-10-CM

## 2023-10-15 DIAGNOSIS — E669 Obesity, unspecified: Secondary | ICD-10-CM

## 2023-10-15 DIAGNOSIS — E7800 Pure hypercholesterolemia, unspecified: Secondary | ICD-10-CM | POA: Diagnosis not present

## 2023-10-15 DIAGNOSIS — D573 Sickle-cell trait: Secondary | ICD-10-CM

## 2023-10-15 DIAGNOSIS — E66812 Obesity, class 2: Secondary | ICD-10-CM

## 2023-10-15 DIAGNOSIS — Z6837 Body mass index (BMI) 37.0-37.9, adult: Secondary | ICD-10-CM

## 2023-10-15 NOTE — Patient Instructions (Signed)

## 2023-10-15 NOTE — Progress Notes (Signed)
I,Victoria T Deloria Lair, CMA,acting as a Neurosurgeon for Gwynneth Aliment, MD.,have documented all relevant documentation on the behalf of Gwynneth Aliment, MD,as directed by  Gwynneth Aliment, MD while in the presence of Gwynneth Aliment, MD.  Subjective:  Patient ID: Dana Vasquez , female    DOB: 1966/07/13 , 57 y.o.   MRN: [id number]  Chief Complaint  Patient presents with   Prediabetes   Hyperlipidemia   Hypothyroidism    HPI  Patient presents today for prediabetes, chol & thyroid follow up. She reports compliance with medications. Denies headache, chest pain & sob.   She reports recently experiencing dizzy spells. Last episode happened Saturday. She has not had any recurrence of sx since Saturday. She is not sure what has caused this issue all of a sudden. She denies recent URI. She thinks she had similar sx about a year ago. She denies recent contact with an ill person.   She admits she has yet to schedule her mammogram.   Hyperlipidemia This is a chronic problem. The current episode started more than 1 year ago. The problem is controlled. She has no history of chronic renal disease. Pertinent negatives include no chest pain. Current antihyperlipidemic treatment includes diet change. Risk factors for coronary artery disease include dyslipidemia, obesity and a sedentary lifestyle.     Past Medical History:  Diagnosis Date   Allergy    Anemia    Arthritis    knees  AND SHOULDERS   Asthma    allergy related - rarely uses inhaler   Bruises easily    Bunion    Cancer (HCC)    1995;thyroid cancer, SURGERY AND RADIOACTIVE IODINE   Ectopic pregnancy    Euthyroid    Fatigue    Fibromyalgia    GERD (gastroesophageal reflux disease)    Headache(784.0)    otc med prn - last one 02/2014   History of blood transfusion 2000   In [place], [place] at [facility] - ? 4 units transfused   Hx of adenomatous and sessile serrated colonic polyps 12/24/2022   Diminutive ssp and adenoma 3/24 - recall 2031    Hypothyroidism    Knee pain    Nausea and vomiting 09/27/2023   Plantar fasciitis    Sleep apnea    uses CPAP   SVD (spontaneous vaginal delivery)    x 3   Thyroid cancer (HCC)    per pt 1995, thyroid was removed   Thyroid disease    thyroidectomy 1995     Family History  Problem Relation Age of Onset   Hypertension Mother    Cancer Mother        uterine   Cancer Father    Cancer Brother        stomach; 40   Early death Brother    Stroke Maternal Uncle    Heart disease Maternal Uncle    Food Allergy Daughter    Allergic rhinitis Daughter    Allergic rhinitis Daughter    Angioedema Neg Hx    Asthma Neg Hx    Eczema Neg Hx    Immunodeficiency Neg Hx    Urticaria Neg Hx    Atopy Neg Hx    Colon cancer Neg Hx    Rectal cancer Neg Hx      Current Outpatient Medications:    albuterol (VENTOLIN HFA) 108 (90 Base) MCG/ACT inhaler, Inhale 2 puffs into the lungs every 6 (six) hours as needed for wheezing or shortness of breath., Disp:  54 g, Rfl: 3   EPINEPHrine (AUVI-Q) 0.3 mg/0.3 mL IJ SOAJ injection, Inject 0.3 mg into the muscle as needed for anaphylaxis. As directed for life-threatening allergic reactions, Disp: 2 each, Rfl: 2   famotidine (PEPCID) 10 MG tablet, Take 10 mg by mouth daily., Disp: , Rfl:    glucose blood (CONTOUR TEST) test strip, Use as directed to check blood sugars once daily E11.69, Disp: 100 each, Rfl: 2   ipratropium (ATROVENT) 0.06 % nasal spray, 1-2 sprays in each nostril up to three times per day as needed for runny nose/post nasal drip, Disp: 15 mL, Rfl: 5   Lactobacillus Acid-Pectin (ACIDOPHILUS/PECTIN) CAPS, Take 1 capsule by mouth 2 (two) times daily., Disp: , Rfl:    levocetirizine (XYZAL) 5 MG tablet, Take 1 tablet (5 mg total) by mouth every evening., Disp: 90 tablet, Rfl: 1   levothyroxine (SYNTHROID) 112 MCG tablet, Take 1 tablet (112 mcg total) by mouth daily before breakfast., Disp: 90 tablet, Rfl: 2   Microlet Lancets MISC, Use as  directed to check blood sugars once daily E11.69, Disp: 100 each, Rfl: 2   ondansetron (ZOFRAN-ODT) 4 MG disintegrating tablet, Take 1 tablet (4 mg total) by mouth every 8 (eight) hours as needed for nausea or vomiting., Disp: 20 tablet, Rfl: 1   naproxen (NAPROSYN) 500 MG tablet, Take 500 mg by mouth 2 (two) times daily as needed. (Patient not taking: Reported on 10/15/2023), Disp: , Rfl:    Allergies  Allergen Reactions   Sulfa Antibiotics Swelling   Amoxicillin Other (See Comments)    Headache Has patient had a PCN reaction causing immediate rash, facial/tongue/throat swelling, SOB or lightheadedness with hypotension: No Has patient had a PCN reaction causing severe rash involving mucus membranes or skin necrosis: No Has patient had a PCN reaction that required hospitalization: No Has patient had a PCN reaction occurring within the last 10 years: Unknown If all of the above answers are "NO", then may proceed with Cephalosporin use.    Nitrofurantoin Monohyd Macro Other (See Comments)    Pt stated she gets a bad yeast infection   Dexilant [Dexlansoprazole]     "bad stomachache"   Meloxicam     Upset stomach and stomach pains   Budesonide Rash   Formoterol Rash   Formoterol Fumarate Rash     Review of Systems  Constitutional: Negative.   Respiratory: Negative.    Cardiovascular: Negative.  Negative for chest pain.  Gastrointestinal: Negative.   Neurological:  Positive for dizziness.  Psychiatric/Behavioral: Negative.       Today's Vitals   10/15/23 0927  BP: 124/88  Pulse: 85  Temp: 98.4 F (36.9 C)  SpO2: 98%  Weight: 219 lb 9.6 oz (99.6 kg)   Body mass index is 37.69 kg/m.  Wt Readings from Last 3 Encounters:  10/15/23 219 lb 9.6 oz (99.6 kg)  09/23/23 216 lb (98 kg)  09/18/23 221 lb 14.4 oz (100.7 kg)     Objective:  Physical Exam Vitals and nursing note reviewed.  Constitutional:      Appearance: Normal appearance.  HENT:     Head: Normocephalic and  atraumatic.     Right Ear: Tympanic membrane, ear canal and external ear normal. There is no impacted cerumen.     Left Ear: Tympanic membrane, ear canal and external ear normal. There is no impacted cerumen.  Eyes:     Extraocular Movements: Extraocular movements intact.  Cardiovascular:     Rate and Rhythm: Normal rate and  regular rhythm.     Heart sounds: Normal heart sounds.  Pulmonary:     Effort: Pulmonary effort is normal.     Breath sounds: Normal breath sounds.  Musculoskeletal:     Cervical back: Normal range of motion.  Skin:    General: Skin is warm.  Neurological:     General: No focal deficit present.     Mental Status: She is alert.  Psychiatric:        Mood and Affect: Mood normal.        Behavior: Behavior normal.         Assessment And Plan:  Prediabetes Assessment & Plan: Her last A1c was drawn Nov 2024, A1c 6.0. No need to recheck this today.    Dizziness Assessment & Plan: Intermittent. I will check labs as below. However, I think her sx can be due to suboptimal hydration. She is encouraged to aim for at least 60 ounces of water daily. No abnormalities noted on ear exam. If persistent, will consider head CT.   Orders: -     BMP8+EGFR -     Magnesium  Pure hypercholesterolemia Assessment & Plan: Chronic, she does not wish to start statin therapy. She agrees to testing as below. Cardiac CT results from July 2024 was reviewed in detail, calcium score is zero. However, if Lp(a) is elevated, I would strongly consider statin therapy.   Orders: -     Lipoprotein A (LPA)  Postoperative hypothyroidism Assessment & Plan: Chronic, she is currently taking Synthroid daily.  I will check thyroid panel and adjust meds as needed.   Orders: -     TSH + free T4  Sickle cell trait (HCC) Assessment & Plan: Chronic, reminded to stay well hydrated.   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 Most recent Notes  reviewed.    Class 2 severe obesity due to excess calories with serious comorbidity and body mass index (BMI) of 37.0 to 37.9 in adult [facility]) Assessment & Plan: She is encouraged to strive for BMI less than 30 to decrease cardiac risk. Advised to aim for at least 150 minutes of exercise per week.    She is encouraged to strive for BMI less than 30 to decrease cardiac risk. Advised to aim for at least 150 minutes of exercise per week.    Return if symptoms worsen or fail to improve.  Patient was given opportunity to ask questions. Patient verbalized understanding of the plan and was able to repeat key elements of the plan. All questions were answered to their satisfaction.    I, Gwynneth Aliment, MD, have reviewed all documentation for this visit. The documentation on 10/15/23 for the exam, diagnosis, procedures, and orders are all accurate and complete.   IF YOU HAVE BEEN REFERRED TO A SPECIALIST, IT MAY TAKE 1-2 WEEKS TO SCHEDULE/PROCESS THE REFERRAL. IF YOU HAVE NOT HEARD FROM US/SPECIALIST IN TWO WEEKS, PLEASE GIVE [place] A CALL AT [phone].   THE PATIENT IS ENCOURAGED TO PRACTICE SOCIAL DISTANCING DUE TO THE COVID-19 PANDEMIC.

## 2023-10-17 LAB — BMP8+EGFR
BUN/Creatinine Ratio: 14 (ref 9–23)
BUN: 14 mg/dL (ref 6–24)
CO2: 27 mmol/L (ref 20–29)
Calcium: 9.3 mg/dL (ref 8.7–10.2)
Chloride: 106 mmol/L (ref 96–106)
Creatinine, Ser: 1 mg/dL (ref 0.57–1.00)
Glucose: 92 mg/dL (ref 70–99)
Potassium: 4.3 mmol/L (ref 3.5–5.2)
Sodium: 143 mmol/L (ref 134–144)
eGFR: 66 mL/min/{1.73_m2} (ref >59)

## 2023-10-17 LAB — MAGNESIUM: Magnesium: 2.2 mg/dL (ref 1.6–2.3)

## 2023-10-17 LAB — TSH+FREE T4
Free T4: 1.82 ng/dL — ABNORMAL HIGH (ref 0.82–1.77)
TSH: 1.27 u[IU]/mL (ref 0.450–4.500)

## 2023-10-17 LAB — LIPOPROTEIN A (LPA): Lipoprotein (a): 77 nmol/L — ABNORMAL HIGH (ref <75.0)

## 2023-10-23 DIAGNOSIS — R42 Dizziness and giddiness: Secondary | ICD-10-CM | POA: Insufficient documentation

## 2023-10-23 NOTE — Assessment & Plan Note (Signed)
Intermittent. I will check labs as below. However, I think her sx can be due to suboptimal hydration. She is encouraged to aim for at least 60 ounces of water daily. No abnormalities noted on ear exam. If persistent, will consider head CT.

## 2023-10-23 NOTE — Assessment & Plan Note (Signed)
Chronic, she is currently taking Synthroid daily.  I will check thyroid panel and adjust meds as needed.

## 2023-10-23 NOTE — Assessment & Plan Note (Signed)
Chronic, reminded to stay well hydrated.

## 2023-10-23 NOTE — Assessment & Plan Note (Addendum)
Chronic, she does not wish to start statin therapy. She agrees to testing as below. Cardiac CT results from July 2024 was reviewed in detail, calcium score is zero. However, if Lp(a) is elevated, I would strongly consider statin therapy.

## 2023-10-23 NOTE — Assessment & Plan Note (Addendum)
Recently diagnosed at [facility]. Most recent Notes reviewed.

## 2023-10-23 NOTE — Assessment & Plan Note (Signed)
Her last A1c was drawn Nov 2024, A1c 6.0. No need to recheck this today.

## 2023-10-23 NOTE — Assessment & Plan Note (Signed)
 She is encouraged to strive for BMI less than 30 to decrease cardiac risk. Advised to aim for at least 150 minutes of exercise per week.

## 2023-11-02 NOTE — Progress Notes (Signed)
 Chief Complaint: Follow-up EGD and colonoscopy, diverticulitis Primary GI Doctor: Dr. Avram  HPI: Patient is is a 58 year old female with a past medical history of arthritis, asthma, sleep apnea, thyroid  cancer 1995 s/p thyroidectomy and radioactive iodine, GERD, anemia and recently diagnosed with MGUS. S/P gastric sleeve surgery 2019. She is known by Dr. Avram.   On 12/30/2022 patient was last seen in the GI office by [facility] for further evaluation regarding bloody stools and intermittent constipation.Patient also has a history of GERD with chronic throat issues.  Patient was scheduled for colonoscopy and EGD.  Patient was also started on Benefiber 1 tablespoon daily and MiraLAX  daily.  Patient was instructed to continue pantoprazole 20 mg p.o. daily and follow a GERD diet.  On 3/37/2024 patient had EGD and colonoscopy, Gastric and rectal bxs normal. 1 ssp and 1 adenoma - colon recall 7 years.  On 08/31/2023 presented to ED at [facility] complaining of abdominal pain. Hemoglobin 12.1, WBC 14.3, 80.2% neutrophils, sodium 139, potassium 5.1, BUN 15, creatinine 0.97, glucose 119, lipase 17, LFTs within normal limits. UA was normal. She had CT scan showing diverticulitis with possible early abscess formation at that time. She was on IV antibiotics and given 10 days of Augmentin.  On 09/16/2023 patient seen again in [facility] ED for abdominal pain. CT Abd/pelvis showed sigmoid diverticulitis with no free air or abscess.  It was unclear if patient had lingering infection or represents worsening infection.  Patient was given a another round of antibiotics however they switched her to Cipro and Flagyl for 7 days.   Interval History:    Patient recently treated for diverticulitis mid November. Patient completed the 7 days of Cipro and Flagyl. Patient reports the abdominal pain has improved by 75%, mild discomfort with palpation. She does have some bloating.  Patient also has history of chronic  constipation, reports it had worsen around the time she was diagnosed with diverticulitis. She is not doing fiber supplements. She is not taking Miralax  po daily. She reports she has bowel movement every 1-2 days.  She reports it often times is a small amount. Denies bloody stools.   Lastly, patient has history of LPR and was taking pantoprazole 20 mg po daily. She is no longer taking the pantoprazole 20 mg po daily.  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 She uses xyzal  prn for allergies which can exacerbate the hoarseness. 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   Past Medical History:  Diagnosis Date   Allergy    Anemia    Arthritis    knees  AND SHOULDERS   Asthma    allergy related - rarely uses inhaler   Bruises easily    Bunion    Cancer (HCC)    1995;thyroid  cancer, SURGERY AND RADIOACTIVE IODINE   Ectopic pregnancy    Euthyroid    Fatigue    Fibromyalgia    GERD (gastroesophageal reflux disease)    Headache(784.0)    otc med prn - last one 02/2014   History of blood transfusion 2000   In [place], [place] at [place] - ? 4 units transfused   Hx of adenomatous and sessile serrated colonic polyps 12/24/2022   Diminutive  ssp and adenoma 3/24 - recall 2031   Hypothyroidism    Knee pain    Nausea and vomiting 09/27/2023   Plantar fasciitis    Sleep apnea    uses CPAP   SVD (spontaneous vaginal delivery)    x 3   Thyroid  cancer (HCC)    per pt 1995, thyroid  was removed   Thyroid  disease    thyroidectomy 1995   Past Surgical History:  Procedure Laterality Date   ABDOMINAL HYSTERECTOMY N/A 04/20/2014   Procedure: Total ABDOMINAL HYSTERECTOMY partial right salpingectomy;  Surgeon: Shanda SHAUNNA Muscat, MD;  Location: [facility];  Service: Gynecology;  Laterality: N/A;   BREATH TEK H PYLORI  09/18/2011   Procedure: BREATH TEK H PYLORI;  Surgeon: Donnice KATHEE Lunger, MD;  Location: [facility];  Service: General;  Laterality: N/A;   BUNIONECTOMY     left;2015   BUNIONECTOMY     COLONOSCOPY     DILATION AND CURETTAGE OF UTERUS  1988   endometriosis   ECTOPIC PREGNANCY SURGERY     fallopian tubes removed     left tube removed per patient - laparotomy   FOOT SURGERY Right 2019   FOOT SURGERY Bilateral 2021   LAPAROSCOPIC GASTRIC SLEEVE RESECTION N/A 04/14/2018   Procedure: LAPAROSCOPIC GASTRIC SLEEVE RESECTION WITH UPPER ENDO AND ERAS PATHWAY;  Surgeon: Lunger Donnice, MD;  Location: [facility];  Service: General;  Laterality: N/A;   LYSIS OF ADHESION N/A 04/20/2014   Procedure: LYSIS OF ADHESION;  Surgeon: Shanda SHAUNNA Muscat, MD;  Location: [facility];  Service: Gynecology;  Laterality: N/A;   THYROIDECTOMY  1995   TUBAL LIGATION     WISDOM TOOTH EXTRACTION     Allergies as of 11/03/2023 - Review Complete 11/03/2023  Allergen Reaction Noted   Sulfa antibiotics Swelling 11/21/2022   Amoxicillin Other (See Comments)    Nitrofurantoin monohyd macro Other (See Comments) 02/12/2012   Dexilant [dexlansoprazole]  07/18/2017   Meloxicam   02/12/2017   Budesonide Rash 09/26/2017   Formoterol Rash 09/26/2017   Formoterol fumarate Rash 09/26/2017   Family History  Problem Relation Age of Onset   Hypertension Mother    Cancer Mother        uterine   Cancer Father    Cancer Brother        stomach; 40   Early death Brother    Stroke Maternal Uncle    Heart disease Maternal Uncle    Food Allergy Daughter    Allergic rhinitis Daughter    Allergic rhinitis Daughter    Angioedema Neg Hx    Asthma Neg Hx    Eczema Neg Hx    Immunodeficiency Neg Hx    Urticaria Neg Hx    Atopy Neg Hx    Colon cancer Neg Hx    Rectal cancer Neg Hx    Review of Systems:    Constitutional: No weight loss,  fever, chills, weakness or fatigue HEENT: Eyes: No change in vision               Ears, Nose, Throat:  No change in hearing or congestion Skin: No rash or itching Cardiovascular: No chest pain, chest pressure or palpitations   Respiratory: No SOB or cough Gastrointestinal: See HPI and otherwise negative Genitourinary: No dysuria or change in urinary frequency Neurological: No headache, dizziness or syncope Musculoskeletal: No new muscle or joint pain Hematologic: No bleeding or bruising Psychiatric: No history of depression or anxiety   Physical Exam:  Vital signs: Pulse  81   Ht 5' 5 (1.651 m)   Wt 220 lb 12.8 oz (100.2 kg)   LMP 04/07/2014   SpO2 98%   BMI 36.74 kg/m   Constitutional: Pleasant African American female appears to be in NAD, Well developed, Well nourished, alert and cooperative Throat: Oral cavity and pharynx without inflammation, swelling or lesion.  Respiratory: Respirations even and unlabored. Lungs clear to auscultation bilaterally.   No wheezes, crackles, or rhonchi.  Cardiovascular: Normal S1, S2. Regular rate and rhythm. No peripheral edema, cyanosis or pallor.  Gastrointestinal:  Soft, nondistended,obese, mild tenderness noted to lower left quadrant with palpation. No rebound or guarding. Normal bowel sounds. No appreciable masses or hepatomegaly. Rectal:  Not performed.  Skin:   Dry and intact without significant lesions or rashes. Psychiatric: Oriented to person, place and time. Demonstrates good judgement and reason without abnormal affect or behaviors.  RELEVANT LABS AND IMAGING:  CBC    Latest Ref Rng & Units 11/21/2022   12:51 PM 10/02/2022    2:42 PM 04/09/2022   10:28 AM  CBC  WBC 3.4 - 10.8 x10E3/uL 9.9  10.0  7.7   Hemoglobin 11.1 - 15.9 g/dL 88.0  87.1  88.3   Hematocrit 34.0 - 46.6 % 37.2  38.9  36.7   Platelets 150 - 450 x10E3/uL 338  340  314    09/16/2023 WBC 9.3, lipase 23  CMP     Latest Ref Rng & Units 10/15/2023   10:19 AM  09/11/2023   12:23 PM 04/15/2023   11:27 AM  CMP  Glucose 70 - 99 mg/dL 92  85  899   BUN 6 - 24 mg/dL 14  13  18    Creatinine 0.57 - 1.00 mg/dL 8.99  9.04  8.98   Sodium 134 - 144 mmol/L 143  145  143   Potassium 3.5 - 5.2 mmol/L 4.3  4.4  4.0   Chloride 96 - 106 mmol/L 106  106  104   CO2 20 - 29 mmol/L 27  24  26    Calcium  8.7 - 10.2 mg/dL 9.3  9.1  9.5   Total Protein 6.0 - 8.5 g/dL   7.3   Total Bilirubin 0.0 - 1.2 mg/dL   0.2   Alkaline Phos 44 - 121 IU/L   99   AST 0 - 40 IU/L   11   ALT 0 - 32 IU/L   11     Lab Results  Component Value Date   TSH 1.270 10/15/2023    09/02/23 GI panel negative, cdiff negative  Flexible Laryngoscopy 09/06/2023 by David Spain our PA-C: Indications: Hoarseness Risks, benefits and clinical relevance of the study were discussed. The patient understands and agrees to proceed.  Procedure: After adequate topical anesthetic was applied, 2 mm flexible laryngoscope was passed through the nasal cavity without difficulty. Flexible laryngoscopy shows patent anterior nasal cavity with minimal crusting, no discharge or infection.  Normal base of tongue and supraglottis. Vocal cord movement suggests muscle tension dysphonia. No nodule, mass, polyp or tumor. Hypopharynx shows some mild edema posterior commissure without mass, pooling of secretions or aspiration.  Impression & Plans:  1) muscle tension dysphonia 2) laryngopharyngeal reflux    GI PROCEDURES:  08/31/2023 CT abdomen/pelvis with IV contrast IMPRESSION:  Findings likely represent diverticulitis or colitis. There is a peripherally enhancing collection that appears to be within the wall of the colon in this region, which could represent a developing abscess. No free air.   09/16/2023 CT  ABDOMEN AND PELVIS WITH CONTRAST  IMPRESSION:  Findings compatible with sigmoid diverticulitis. No free air or  abscess.   Upper Endoscopy 01/22/2023 Impression:  - A sleeve gastrectomy was found.  -  Gastritis. Biopsied.  - The examination was otherwise normal. Photo function malfunction so no images Path:  1. Surgical [P], gastric antrum bx - ANTRAL/OXYNTIC MUCOSA WITH NO SIGNIFICANT PATHOLOGY  Colonoscopy 01/22/2023, colon recall 7 years (12/2029) Impression:  - Two diminutive polyps in the descending colon and in the transverse colon, removed with a cold snare. Resected and retrieved.  - Diverticulosis in the sigmoid colon.  - Congested and erythematous mucosa in the distal rectum. Biopsied.  - The examination was otherwise normal on direct and retroflexion views. Photo function was not working so no images. Path: 2. Surgical [P], colon, transverse and descending, polyp (2) - TUBULAR ADENOMA. - SESSILE SERRATED POLYP WITHOUT DYSPLASIA 3. Surgical [P], colon, rectum bx - COLORECTAL TYPE MUCOSA WITH NO SIGNIFICANT PATHOLOGY  Colonoscopy by Dr. Avram 08/01/2017: - The perianal and digital rectal examinations were normal.  - Multiple small-mouthed diverticula were found in the sigmoid colon. There was no evidence of diverticular bleeding.  - The exam was otherwise without abnormality on direct and retroflexion views - 10 year recall colonoscopy    Colonoscopy 11/24/2008: 1) Mild diverticulosis in the sigmoid colon 2) External hemorrhoids in the rectum 3) ? Proctitis in the rectum - biopsies normal 4) Nodular mucosa in the ileum - biopsies normal 5) Otherwise normal, excellent prep 1. ILEUM, BIOPSY:   - BENIGN SMALL BOWEL MUCOSA WITH REACTIVE LYMPHOID   AGGREGATES.   - THERE IS NO EVIDENCE OF SIGNIFICANT INFLAMMATION,   DYSPLASIA, OR MALIGNANCY.   2. RECTUM, BIOPSY:   - BENIGN COLONIC MUCOSA WITH FOCAL SURFACE EPITHELIAL   DENUDATION.   - NO SIGNIFICANT INFLAMMATION OR OTHER ABNORMALITIES   IDENTIFIED.    EGD 10/26/2008: Nodular mucosa in the bulb of duodenum, question polyp.  Biopsied. 2 cm hiatal hernia, sliding Mild gastritis in the antrum, biopsied Otherwise normal  examination Strong gag reflex to scope 1. DUODENAL BULB: FINDINGS CONSISTENT WITH GASTRIC HETEROTOPIA.   NO ADENOMATOUS CHANGE OR EVIDENCE OF MALIGNANCY.   2. STOMACH: MILD CHRONIC GASTRITIS. NO HELICOBACTER PYLORI,   DYSPLASIA OR EVIDENCE OF MALIGNANCY IDENTIFIED.    Colonoscopy 01/15/2005: Diverticulosis. External hemorrhoids.  Assessment: Encounter Diagnoses  Name Primary?   History of diverticulitis Yes   Chronic idiopathic constipation    Rectal bleeding    Tubular adenoma of colon    Laryngopharyngeal reflux (LPR)     58 year old African American female patient that presents for follow-up after recent bout of reoccurring diverticulitis. Patient received two rounds of antibiotics on two separate occasions few weeks apart. Her most recent WBC 9.9. No presenting symptoms today. Patient reports she feels 75% better. We discussed the importance of maximizing her constipation regimen and slowly increasing her daily fiber intake. She is no longer having rectal bleeding.    She has also had resolution of LPR symptoms which seem coincide with allergic rhinitis and muscle tension dysphonia. She has experienced improvement with speech therapy and prn medications.  Plan: -Reinforced Miralax  po daily. -Recommend starting Citrucel 1 tsp po daily, slowly incorporate fiber into diet. -Go to the ER if unable to pass gas, severe AB pain, unable to hold down food, any shortness of breath of chest pain. -Colon recall 7 years (12/2029) -Follow-up with Dr. Avram in 6 mths  Altamese Deguire, [facility] 11/03/2023, 10:13  AM  Cc: Jarold Medici, MD

## 2023-11-03 ENCOUNTER — Encounter: Payer: Self-pay | Admitting: Gastroenterology

## 2023-11-03 ENCOUNTER — Ambulatory Visit: Payer: 59 | Admitting: Gastroenterology

## 2023-11-03 VITALS — HR 81 | Ht 65.0 in | Wt 220.8 lb

## 2023-11-03 DIAGNOSIS — K625 Hemorrhage of anus and rectum: Secondary | ICD-10-CM

## 2023-11-03 DIAGNOSIS — K219 Gastro-esophageal reflux disease without esophagitis: Secondary | ICD-10-CM

## 2023-11-03 DIAGNOSIS — K5904 Chronic idiopathic constipation: Secondary | ICD-10-CM | POA: Diagnosis not present

## 2023-11-03 DIAGNOSIS — Z860101 Personal history of adenomatous and serrated colon polyps: Secondary | ICD-10-CM | POA: Diagnosis not present

## 2023-11-03 DIAGNOSIS — D126 Benign neoplasm of colon, unspecified: Secondary | ICD-10-CM

## 2023-11-03 DIAGNOSIS — Z8719 Personal history of other diseases of the digestive system: Secondary | ICD-10-CM

## 2023-11-03 MED ORDER — CITRUCEL PO POWD: 1.0000 | Freq: Every day | ORAL | Status: AC

## 2023-11-03 MED ORDER — POLYETHYLENE GLYCOL 3350 17 G PO PACK: 17.0000 g | PACK | Freq: Every day | ORAL | Status: AC

## 2023-11-03 NOTE — Patient Instructions (Addendum)
 If your blood pressure at your visit was 140/90 or greater, please contact your primary care physician to follow up on this. ______________________________________________________  If you are age 57 or older, your body mass index should be between 23-30. Your Body mass index is 36.74 kg/m. If this is out of the aforementioned range listed, please consider follow up with your Primary Care Provider.  If you are age 31 or younger, your body mass index should be between 19-25. Your Body mass index is 36.74 kg/m. If this is out of the aformentioned range listed, please consider follow up with your Primary Care Provider.  ________________________________________________________  The [facility] providers would like to encourage you to use MYCHART to communicate with providers for non-urgent requests or questions.  Due to long hold times on the telephone, sending your provider a message by [facility] may be a faster and more efficient way to get a response.  Please allow 48 business hours for a response.  Please remember that this is for non-urgent requests.  _______________________________________________________  Due to recent changes in healthcare laws, you may see the results of your imaging and laboratory studies on MyChart before your provider has had a chance to review them.  We understand that in some cases there may be results that are confusing or concerning to you. Not all laboratory results come back in the same time frame and the provider may be waiting for multiple results in order to interpret others.  Please give us  48 hours in order for your provider to thoroughly review all the results before contacting the office for clarification of your results.    Please purchase the following medications over the counter and take as directed: Citrucel - take once daily Miralax  - take once daily   Please follow up with Dr. Avram in 6 months.  Thank you for entrusting me with your care and for  [facility], Deanna May, NP

## 2023-11-04 ENCOUNTER — Encounter: Payer: Self-pay | Admitting: Podiatry

## 2023-11-07 ENCOUNTER — Encounter (HOSPITAL_COMMUNITY): Payer: Self-pay | Admitting: *Deleted

## 2023-11-07 ENCOUNTER — Encounter: Payer: Self-pay | Admitting: Internal Medicine

## 2023-11-11 ENCOUNTER — Other Ambulatory Visit: Payer: Self-pay | Admitting: Obstetrics and Gynecology

## 2023-11-11 DIAGNOSIS — Z1231 Encounter for screening mammogram for malignant neoplasm of breast: Secondary | ICD-10-CM

## 2023-11-25 ENCOUNTER — Ambulatory Visit: Payer: BC Managed Care – PPO | Admitting: Podiatry

## 2023-11-27 ENCOUNTER — Encounter (INDEPENDENT_AMBULATORY_CARE_PROVIDER_SITE_OTHER): Payer: Self-pay

## 2023-12-08 ENCOUNTER — Ambulatory Visit: Payer: BC Managed Care – PPO | Admitting: Physician Assistant

## 2023-12-15 ENCOUNTER — Encounter (INDEPENDENT_AMBULATORY_CARE_PROVIDER_SITE_OTHER): Payer: Self-pay

## 2023-12-18 LAB — LAB REPORT - SCANNED: EGFR: 79

## 2023-12-22 ENCOUNTER — Telehealth: Payer: Self-pay

## 2023-12-22 NOTE — Transitions of Care (Post Inpatient/ED Visit) (Signed)
 12/22/2023  Name: Dana Vasquez MRN: [id number] DOB: May 26, 1966  Today's TOC FU Call Status: Today's TOC FU Call Status:: Successful TOC FU Call Completed TOC FU Call Complete Date: 12/22/23 Patient's Name and Date of Birth confirmed.  Transition Care Management Follow-up Telephone Call Date of Discharge: 12/17/23 Discharge Facility: Other ([facility]) Name of Other ([facility]) [facility]charge Facility: [facility] Type of Discharge: Emergency Department Reason for ED Visit: Other: How have you been since you were released from the hospital?: Better Any questions or concerns?: No  Items Reviewed: Did you receive and understand the discharge instructions provided?: Yes Medications obtained,verified, and reconciled?: Yes (Medications Reviewed) Any new allergies since your discharge?: No Dietary orders reviewed?: NA Do you have support at home?: Yes People in Home: spouse  Medications Reviewed Today: Medications Reviewed Today     Reviewed by Argentina Ponder, CMA (Certified Medical Assistant) on 12/22/23 at 1040  Med List Status: <None>   Medication Order Taking? Sig Documenting Provider Last Dose Status Informant  albuterol (VENTOLIN HFA) 108 (90 Base) MCG/ACT inhaler [id number] Yes Inhale 2 puffs into the lungs every 6 (six) hours as needed for wheezing or shortness of breath. Marcelyn Bruins, MD Taking Active   EPINEPHrine (AUVI-Q) 0.3 mg/0.3 mL IJ SOAJ injection [id number] Yes Inject 0.3 mg into the muscle as needed for anaphylaxis. As directed for life-threatening allergic reactions Marcelyn Bruins, MD Taking Active   famotidine (PEPCID) 10 MG tablet [id number] Yes Take 10 mg by mouth daily. [provider] Taking Active   glucose blood (CONTOUR TEST) test strip [id number] Yes Use as directed to check blood sugars once daily E11.69 Dorothyann Peng, MD Taking Active   ipratropium (ATROVENT) 0.06 % nasal spray [id number] Yes 1-2 sprays in each nostril up  to three times per day as needed for runny nose/post nasal drip Marcelyn Bruins, MD Taking Active   levocetirizine (XYZAL) 5 MG tablet [id number] Yes Take 1 tablet (5 mg total) by mouth every evening.  Patient taking differently: Take 5 mg by mouth as needed.   Marcelyn Bruins, MD Taking Active   levothyroxine (SYNTHROID) 112 MCG tablet [id number] Yes Take 1 tablet (112 mcg total) by mouth daily before breakfast. Dorothyann Peng, MD Taking Active   methylcellulose (CITRUCEL) oral powder [id number] Yes Take 1 packet by mouth daily. May, Deanna J, NP Taking Active   Microlet Lancets MISC [id number] Yes Use as directed to check blood sugars once daily E11.69 Dorothyann Peng, MD Taking Active   polyethylene glycol (MIRALAX) 17 g packet [id number] Yes Take 17 g by mouth daily. May, Deanna J, NP Taking Active   Med List Note Halford Decamp, [place] 04/08/18 1526): CPAP AT BEDTIME.            Home Care and Equipment/Supplies: Were Home Health Services Ordered?: NA Any new equipment or medical supplies ordered?: NA  Functional Questionnaire: Do you need assistance with bathing/showering or dressing?: No Do you need assistance with meal preparation?: No Do you need assistance with eating?: No Do you have difficulty maintaining continence: No Do you need assistance with getting out of bed/getting out of a chair/moving?: No Do you have difficulty managing or taking your medications?: No  Follow up appointments reviewed: PCP Follow-up appointment confirmed?: NA Specialist Hospital Follow-up appointment confirmed?: Yes Date of Specialist follow-up appointment?: 12/22/23 Follow-Up Specialty Provider:: Dewaine Conger Do you need transportation to your follow-up appointment?: No Do you understand care options if your condition(s) worsen?: Yes-patient verbalized understanding  SIGNATUREYL,RMA

## 2023-12-24 ENCOUNTER — Ambulatory Visit: Payer: 59

## 2023-12-31 ENCOUNTER — Ambulatory Visit
Admission: RE | Admit: 2023-12-31 | Discharge: 2023-12-31 | Disposition: A | Discharge status: Home / Self Care | Payer: 59 | Source: Ambulatory Visit | Attending: Obstetrics and Gynecology | Admitting: Obstetrics and Gynecology

## 2023-12-31 DIAGNOSIS — Z1231 Encounter for screening mammogram for malignant neoplasm of breast: Secondary | ICD-10-CM

## 2024-02-09 ENCOUNTER — Encounter: Payer: Self-pay | Admitting: Internal Medicine

## 2024-03-02 ENCOUNTER — Ambulatory Visit: Admitting: Dietician

## 2024-03-09 ENCOUNTER — Encounter: Payer: Self-pay | Admitting: Internal Medicine

## 2024-03-09 NOTE — Progress Notes (Signed)
SCANNED DOCUMENT

## 2024-03-10 ENCOUNTER — Ambulatory Visit: Payer: BC Managed Care – PPO | Admitting: Allergy

## 2024-03-25 ENCOUNTER — Encounter: Payer: Self-pay | Admitting: Internal Medicine

## 2024-03-25 ENCOUNTER — Other Ambulatory Visit: Payer: Self-pay | Admitting: Internal Medicine

## 2024-03-25 DIAGNOSIS — D472 Monoclonal gammopathy: Secondary | ICD-10-CM

## 2024-03-30 ENCOUNTER — Other Ambulatory Visit: Payer: Self-pay

## 2024-03-30 DIAGNOSIS — D472 Monoclonal gammopathy: Secondary | ICD-10-CM

## 2024-04-08 ENCOUNTER — Ambulatory Visit: Payer: Self-pay | Admitting: Dietician

## 2024-04-20 NOTE — Progress Notes (Signed)
 I,Victoria T Emmitt, CMA,acting as a Neurosurgeon for [name], MD.,have documented all relevant documentation on the behalf of [name], MD,as directed by  [name], MD while in the presence of [name], MD.  Subjective:    Patient ID: Dana Vasquez , female    DOB: 1965/12/20 , 58 y.o.   MRN: [id number]  Chief Complaint  Patient presents with  . Annual Exam    Patient presents today for annual exam. She reports compliance with medications. Denies headache, chest pain & sob. She complains of painful hemorrhoids. They have flared up more recently. Painful to walk sometimes.  She also notices hair loss.   . Prediabetes  . Hyperlipidemia  . Hypothyroidism    HPI Discussed the use of AI scribe software for clinical note transcription with the patient, who gave verbal consent to proceed.  History of Present Illness Dana Vasquez is a 58 year old female who presents for an annual physical exam.  She experiences left shoulder and knee pain, which she attributes to arthritis. She uses topical Voltaren gel for relief, although not consistently. Flare-ups are described as 'unbearable', and she incorporates turmeric and ginger in her diet to help manage symptoms. She was previously told she had a torn rotator cuff, but her most recent orthopedic evaluation suggested arthritis with frayed areas rather than a tear.  She experiences hemorrhoids, which she attributes to previous constipation. She has used Anusol  in the past but requires a refill as her current supply is old. Apple tea helps regulate her bowel movements.  She bruises easily and has thin skin, with visible veins and varicose veins. She has not been taking vitamin C regularly but has it available. She mentions lumps under her skin and swelling in her foot, which she attributes to past trauma.  She is not currently on any treatment for her smoldering myeloma and is under observation with stable lab results. She  visits her specialist at [facility] annually and has labs done every six months.  She has two daughters, one in [place]  and one in [place]. She plans to travel to [place], where her father is from, and has recently renewed her passport.   Hyperlipidemia This is a chronic problem. The current episode started more than 1 year ago. The problem is controlled. She has no history of chronic renal disease. Pertinent negatives include no chest pain. Current antihyperlipidemic treatment includes diet change. Risk factors for coronary artery disease include dyslipidemia, obesity and a sedentary lifestyle.     Past Medical History:  Diagnosis Date  . Allergy   . Anemia   . Arthritis    knees  AND SHOULDERS  . Asthma    allergy related - rarely uses inhaler  . Bruises easily   . Bunion   . Cancer (HCC)    1995;thyroid  cancer, SURGERY AND RADIOACTIVE IODINE  . Ectopic pregnancy   . Euthyroid   . Fatigue   . Fibromyalgia   . GERD (gastroesophageal reflux disease)   . Headache(784.0)    otc med prn - last one 02/2014  . History of blood transfusion 2000   In [place], [place] at [place] - ? 4 units transfused  . Hx of adenomatous and sessile serrated colonic polyps 12/24/2022   Diminutive ssp and adenoma 3/24 - recall 2031  . Hypothyroidism   . Knee pain   . Nausea and vomiting 09/27/2023  . Plantar fasciitis   . Sleep apnea    uses CPAP  .  SVD (spontaneous vaginal delivery)    x 3  . Thyroid  cancer (HCC)    per pt 1995, thyroid  was removed  . Thyroid  disease    thyroidectomy 1995     Family History  Problem Relation Age of Onset  . Hypertension Mother   . Cancer Mother        uterine  . Cancer Father   . Cancer Brother        stomach; 40  . Early death Brother   . Stroke Maternal Uncle   . Heart disease Maternal Uncle   . Food Allergy Daughter   . Allergic rhinitis Daughter   . Allergic rhinitis Daughter   . Angioedema Neg Hx   . Asthma Neg Hx   . Eczema Neg Hx   .  Immunodeficiency Neg Hx   . Urticaria Neg Hx   . Atopy Neg Hx   . Colon cancer Neg Hx   . Rectal cancer Neg Hx      Current Outpatient Medications:  .  EPINEPHrine  (AUVI-Q ) 0.3 mg/0.3 mL IJ SOAJ injection, Inject 0.3 mg into the muscle as needed for anaphylaxis. As directed for life-threatening allergic reactions, Disp: 2 each, Rfl: 2 .  famotidine  (PEPCID ) 10 MG tablet, Take 10 mg by mouth daily., Disp: , Rfl:  .  glucose blood (CONTOUR TEST) test strip, Use as directed to check blood sugars once daily E11.69, Disp: 100 each, Rfl: 2 .  hydrocortisone  (ANUSOL -HC) 2.5 % rectal cream, Place 1 Application rectally 2 (two) times daily., Disp: 30 g, Rfl: 0 .  ipratropium (ATROVENT ) 0.06 % nasal spray, 1-2 sprays in each nostril up to three times per day as needed for runny nose/post nasal drip, Disp: 15 mL, Rfl: 5 .  levocetirizine (XYZAL ) 5 MG tablet, Take 1 tablet (5 mg total) by mouth every evening. (Patient taking differently: Take 5 mg by mouth as needed.), Disp: 90 tablet, Rfl: 1 .  methylcellulose (CITRUCEL) oral powder, Take 1 packet by mouth daily., Disp: , Rfl:  .  Microlet Lancets MISC, Use as directed to check blood sugars once daily E11.69, Disp: 100 each, Rfl: 2 .  polyethylene glycol (MIRALAX ) 17 g packet, Take 17 g by mouth daily., Disp: , Rfl:  .  albuterol  (VENTOLIN  HFA) 108 (90 Base) MCG/ACT inhaler, Inhale 2 puffs into the lungs every 6 (six) hours as needed for wheezing or shortness of breath., Disp: 54 g, Rfl: 3 .  levothyroxine  (SYNTHROID ) 112 MCG tablet, TAKE 1 TABLET(112 MCG) BY MOUTH DAILY BEFORE BREAKFAST, Disp: 90 tablet, Rfl: 2   Allergies  Allergen Reactions  . Sulfa Antibiotics Swelling  . Amoxicillin Other (See Comments)    Headache Has patient had a PCN reaction causing immediate rash, facial/tongue/throat swelling, SOB or lightheadedness with hypotension: No Has patient had a PCN reaction causing severe rash involving mucus membranes or skin necrosis: No Has  patient had a PCN reaction that required hospitalization: No Has patient had a PCN reaction occurring within the last 10 years: Unknown If all of the above answers are NO, then may proceed with Cephalosporin use.   . Nitrofurantoin Monohyd Macro Other (See Comments)    Pt stated she gets a bad yeast infection  . Dexilant [Dexlansoprazole]     bad stomachache  . Meloxicam      Upset stomach and stomach pains  . Budesonide Rash  . Formoterol Rash  . Formoterol Fumarate Rash      The patient states she uses none for birth control. Patient's  last menstrual period was 04/07/2014.. Negative for Dysmenorrhea. Negative for: breast discharge, breast lump(s), breast pain and breast self exam. Associated symptoms include abnormal vaginal bleeding. Pertinent negatives include abnormal bleeding (hematology), anxiety, decreased libido, depression, difficulty falling sleep, dyspareunia, history of infertility, nocturia, sexual dysfunction, sleep disturbances, urinary incontinence, urinary urgency, vaginal discharge and vaginal itching. Diet regular.The patient states her exercise level is  intermittent.  . The patient's tobacco use is:  Social History   Tobacco Use  Smoking Status Never  Smokeless Tobacco Never  . She has been exposed to passive smoke. The patient's alcohol use is:  Social History   Substance and Sexual Activity  Alcohol Use Yes  . Alcohol/week: 0.0 standard drinks of alcohol   Comment: occ    Review of Systems  Constitutional: Negative.   HENT: Negative.    Eyes: Negative.   Respiratory: Negative.    Cardiovascular: Negative.  Negative for chest pain.  Gastrointestinal: Negative.   Endocrine: Negative.   Genitourinary: Negative.   Musculoskeletal: Negative.   Skin: Negative.   Allergic/Immunologic: Negative.   Neurological: Negative.   Hematological: Negative.   Psychiatric/Behavioral: Negative.       Today's Vitals   04/21/24 0956  BP: 120/82  Pulse: 88   Temp: 98.3 F (36.8 C)  SpO2: 98%  Weight: 233 lb (105.7 kg)  Height: 5' 5 (1.651 m)   Body mass index is 38.77 kg/m.  Wt Readings from Last 3 Encounters:  04/21/24 233 lb (105.7 kg)  11/03/23 220 lb 12.8 oz (100.2 kg)  10/15/23 219 lb 9.6 oz (99.6 kg)     Objective:  Physical Exam Vitals and nursing note reviewed.  Constitutional:      Appearance: Normal appearance.  HENT:     Head: Normocephalic and atraumatic.     Right Ear: Tympanic membrane, ear canal and external ear normal.     Left Ear: Tympanic membrane, ear canal and external ear normal.     Nose: Nose normal.     Mouth/Throat:     Mouth: Mucous membranes are moist.     Pharynx: Oropharynx is clear.  Eyes:     Extraocular Movements: Extraocular movements intact.     Conjunctiva/sclera: Conjunctivae normal.     Pupils: Pupils are equal, round, and reactive to light.  Cardiovascular:     Rate and Rhythm: Normal rate and regular rhythm.     Pulses: Normal pulses.     Heart sounds: Normal heart sounds.     Comments: Varicose veins b/l Pulmonary:     Effort: Pulmonary effort is normal.     Breath sounds: Normal breath sounds.  Chest:  Breasts:    Tanner Score is 5.     Comments: She kept her bra on Abdominal:     General: Abdomen is flat. Bowel sounds are normal.     Palpations: Abdomen is soft.  Genitourinary:    Comments: deferred Musculoskeletal:        General: Normal range of motion.     Cervical back: Normal range of motion and neck supple.  Skin:    General: Skin is warm and dry.     Comments: Scarring right foot  Neurological:     General: No focal deficit present.     Mental Status: She is alert and oriented to person, place, and time.  Psychiatric:        Mood and Affect: Mood normal.        Behavior: Behavior normal.  Assessment And Plan:     Encounter for general adult medical examination without abnormal findings Assessment & Plan: A full exam was performed.  Importance of  monthly self breast exams was discussed with the patient.  She is advised to get 30-45 minutes of regular exercise, no less than four to five days per week. Both weight-bearing and aerobic exercises are recommended.  She is advised to follow a healthy diet with at least six fruits/veggies per day, decrease intake of red meat and other saturated fats and to increase fish intake to twice weekly.  Meats/fish should not be fried -- baked, boiled or broiled is preferable. It is also important to cut back on your sugar intake.  Be sure to read labels - try to avoid anything with added sugar, high fructose corn syrup or other sweeteners.  If you must use a sweetener, you can try stevia or monkfruit.  It is also important to avoid artificially sweetened foods/beverages and diet drinks. Lastly, wear SPF 50 sunscreen on exposed skin and when in direct sunlight for an extended period of time.  Be sure to avoid fast food restaurants and aim for at least 60 ounces of water daily.      Orders: -     CBC -     CMP14+EGFR -     Lipid panel  Prediabetes Assessment & Plan: Previous labs reviewed, her A1c has been elevated in the past. I will check an A1c today. Reminded to avoid refined sugars including sugary drinks/foods and processed meats including bacon, sausages and deli meats.    Orders: -     Hemoglobin A1c -     Microalbumin / creatinine urine ratio  Pure hypercholesterolemia Assessment & Plan: Chronic, on statin therapy. Calcium  score is zero; however, has elevated Lp(a) levels. Therefore, ideally LDL goal should be less than 70.     Postoperative hypothyroidism Assessment & Plan: Chronic, she is currently taking Synthroid  112mcg daily.  I will check thyroid  panel and adjust meds as needed.   Orders: -     TSH + free T4  External hemorrhoids Assessment & Plan: Not examined.  Recent exacerbation likely related to previous constipation. Anusol  used in the past with good effect. - Prescribe  Anusol  cream.   Hair loss -     CBC -     Iron, TIBC and Ferritin Panel  Primary osteoarthritis of left shoulder Assessment & Plan: Chronic arthritis in left shoulder and knee causing significant pain and functional limitations. Previous misdiagnosis of rotator cuff tear. Currently followed at [facility].  Uses turmeric and ginger for anti-inflammatory effects. - Advise Voltaren gel twice daily. - Encourage continued use of turmeric and ginger.    Varicose veins of both legs with edema Assessment & Plan: Referral to vascular surgeon for varicose veins and possible lipedema.   Orders: -     Ambulatory referral to Vascular Surgery  Spontaneous ecchymoses Assessment & Plan: Chronic easy bruising possibly due to weak capillaries. Bruising may be related to thin skin and visible veins. - Check blood count and platelets. - Advise starting vitamin C supplementation.   Smoldering multiple myeloma Assessment & Plan: Smoldering myeloma under observation with stable labs. She is followed by [facility].  Plans to extend lab intervals to six months. - Continue observation with annual specialist visits. - Perform blood work including A1c, blood count, liver and kidney function, cholesterol, and thyroid  function.   Sickle cell trait [facility]) Assessment & Plan: Chronic, reminded to stay well  hydrated.   Class 2 severe obesity due to excess calories with serious comorbidity and body mass index (BMI) of 38.0 to 38.9 in adult [facility]) Assessment & Plan: She has gained 13lbs since January 2025. She is encouraged to aim for at least 150 minutes of exercise per week.    Other orders -     Albuterol  Sulfate HFA; Inhale 2 puffs into the lungs every 6 (six) hours as needed for wheezing or shortness of breath.  Dispense: 54 g; Refill: 3 -     Hydrocortisone  (Perianal); Place 1 Application rectally 2 (two) times daily.  Dispense: 30 g; Refill: 0   Return for 1 year physical, 6 month thyroid   check. Patient was given opportunity to ask questions. Patient verbalized understanding of the plan and was able to repeat key elements of the plan. All questions were answered to their satisfaction.   I, [name], MD, have reviewed all documentation for this visit. The documentation on 04/21/24 for the exam, diagnosis, procedures, and orders are all accurate and complete.

## 2024-04-20 NOTE — Patient Instructions (Incomplete)

## 2024-04-21 ENCOUNTER — Ambulatory Visit (INDEPENDENT_AMBULATORY_CARE_PROVIDER_SITE_OTHER): Payer: Self-pay | Admitting: Internal Medicine

## 2024-04-21 ENCOUNTER — Other Ambulatory Visit: Payer: Self-pay | Admitting: Internal Medicine

## 2024-04-21 ENCOUNTER — Encounter: Payer: Self-pay | Admitting: Internal Medicine

## 2024-04-21 VITALS — BP 120/82 | HR 88 | Temp 98.3°F | Ht 65.0 in | Wt 233.0 lb

## 2024-04-21 DIAGNOSIS — E7800 Pure hypercholesterolemia, unspecified: Secondary | ICD-10-CM | POA: Diagnosis not present

## 2024-04-21 DIAGNOSIS — K644 Residual hemorrhoidal skin tags: Secondary | ICD-10-CM

## 2024-04-21 DIAGNOSIS — R233 Spontaneous ecchymoses: Secondary | ICD-10-CM

## 2024-04-21 DIAGNOSIS — R7303 Prediabetes: Secondary | ICD-10-CM

## 2024-04-21 DIAGNOSIS — L659 Nonscarring hair loss, unspecified: Secondary | ICD-10-CM

## 2024-04-21 DIAGNOSIS — E890 Postprocedural hypothyroidism: Secondary | ICD-10-CM | POA: Diagnosis not present

## 2024-04-21 DIAGNOSIS — Z0000 Encounter for general adult medical examination without abnormal findings: Secondary | ICD-10-CM | POA: Diagnosis not present

## 2024-04-21 DIAGNOSIS — D573 Sickle-cell trait: Secondary | ICD-10-CM

## 2024-04-21 DIAGNOSIS — I83893 Varicose veins of bilateral lower extremities with other complications: Secondary | ICD-10-CM

## 2024-04-21 DIAGNOSIS — Z6838 Body mass index (BMI) 38.0-38.9, adult: Secondary | ICD-10-CM

## 2024-04-21 DIAGNOSIS — M19012 Primary osteoarthritis, left shoulder: Secondary | ICD-10-CM

## 2024-04-21 DIAGNOSIS — E66812 Obesity, class 2: Secondary | ICD-10-CM

## 2024-04-21 DIAGNOSIS — D472 Monoclonal gammopathy: Secondary | ICD-10-CM

## 2024-04-21 MED ORDER — ALBUTEROL SULFATE HFA 108 (90 BASE) MCG/ACT IN AERS: 2.0000 | INHALATION_SPRAY | Freq: Four times a day (QID) | RESPIRATORY_TRACT | 3 refills | Status: AC | PRN

## 2024-04-21 MED ORDER — HYDROCORTISONE (PERIANAL) 2.5 % EX CREA: 1.0000 | TOPICAL_CREAM | Freq: Two times a day (BID) | CUTANEOUS | 0 refills | Status: AC

## 2024-04-21 NOTE — Assessment & Plan Note (Addendum)
 Not examined.  Recent exacerbation likely related to previous constipation. Anusol  used in the past with good effect. - Prescribe Anusol  cream.

## 2024-04-21 NOTE — Assessment & Plan Note (Signed)
 Chronic, reminded to stay well hydrated.

## 2024-04-21 NOTE — Assessment & Plan Note (Signed)
 Chronic easy bruising possibly due to weak capillaries. Bruising may be related to thin skin and visible veins. - Check blood count and platelets. - Advise starting vitamin C supplementation.

## 2024-04-21 NOTE — Assessment & Plan Note (Addendum)
 Smoldering myeloma under observation with stable labs. She is followed by [facility].  Plans to extend lab intervals to six months. - Continue observation with annual specialist visits. - Perform blood work including A1c, blood count, liver and kidney function, cholesterol, and thyroid  function.

## 2024-04-21 NOTE — Assessment & Plan Note (Signed)
 She has gained 13lbs since January 2025. She is encouraged to aim for at least 150 minutes of exercise per week.

## 2024-04-21 NOTE — Assessment & Plan Note (Signed)
 Chronic, she is currently taking Synthroid daily.  I will check thyroid panel and adjust meds as needed.

## 2024-04-21 NOTE — Assessment & Plan Note (Signed)
 Previous labs reviewed, her A1c has been elevated in the past. I will check an A1c today. Reminded to avoid refined sugars including sugary drinks/foods and processed meats including bacon, sausages and deli meats.

## 2024-04-21 NOTE — Assessment & Plan Note (Addendum)
 Chronic, on statin therapy. Calcium  score is zero; however, has elevated Lp(a) levels. Therefore, ideally LDL goal should be less than 70.

## 2024-04-21 NOTE — Assessment & Plan Note (Signed)
 Chronic arthritis in left shoulder and knee causing significant pain and functional limitations. Previous misdiagnosis of rotator cuff tear. Currently followed at [facility].  Uses turmeric and ginger for anti-inflammatory effects. - Advise Voltaren gel twice daily. - Encourage continued use of turmeric and ginger.

## 2024-04-21 NOTE — Assessment & Plan Note (Signed)
 Referral to vascular surgeon for varicose veins and possible lipedema.

## 2024-04-21 NOTE — Assessment & Plan Note (Signed)

## 2024-04-22 ENCOUNTER — Encounter: Payer: Self-pay | Admitting: Internal Medicine

## 2024-04-22 LAB — MICROALBUMIN / CREATININE URINE RATIO
Creatinine, Urine: 76.5 mg/dL
Microalb/Creat Ratio: 20 mg/g{creat} (ref 0–29)
Microalbumin, Urine: 15.4 ug/mL

## 2024-04-22 LAB — CMP14+EGFR
ALT: 11 IU/L (ref 0–32)
AST: 13 IU/L (ref 0–40)
Albumin: 4.3 g/dL (ref 3.8–4.9)
Alkaline Phosphatase: 94 IU/L (ref 44–121)
BUN/Creatinine Ratio: 19 (ref 9–23)
BUN: 18 mg/dL (ref 6–24)
Bilirubin Total: 0.2 mg/dL (ref 0.0–1.2)
CO2: 22 mmol/L (ref 20–29)
Calcium: 9.4 mg/dL (ref 8.7–10.2)
Chloride: 106 mmol/L (ref 96–106)
Creatinine, Ser: 0.97 mg/dL (ref 0.57–1.00)
Globulin, Total: 3.4 g/dL (ref 1.5–4.5)
Glucose: 84 mg/dL (ref 70–99)
Potassium: 4.3 mmol/L (ref 3.5–5.2)
Sodium: 143 mmol/L (ref 134–144)
Total Protein: 7.7 g/dL (ref 6.0–8.5)
eGFR: 69 mL/min/{1.73_m2} (ref >59)

## 2024-04-22 LAB — LIPID PANEL
Chol/HDL Ratio: 3 ratio (ref 0.0–4.4)
Cholesterol, Total: 201 mg/dL — ABNORMAL HIGH (ref 100–199)
HDL: 66 mg/dL (ref >39)
LDL Chol Calc (NIH): 127 mg/dL — ABNORMAL HIGH (ref 0–99)
Triglycerides: 41 mg/dL (ref 0–149)
VLDL Cholesterol Cal: 8 mg/dL (ref 5–40)

## 2024-04-22 LAB — TSH+FREE T4
Free T4: 1.79 ng/dL — ABNORMAL HIGH (ref 0.82–1.77)
TSH: 0.638 u[IU]/mL (ref 0.450–4.500)

## 2024-04-22 LAB — CBC
Hematocrit: 37.4 % (ref 34.0–46.6)
Hemoglobin: 11.4 g/dL (ref 11.1–15.9)
MCH: 25.2 pg — ABNORMAL LOW (ref 26.6–33.0)
MCHC: 30.5 g/dL — ABNORMAL LOW (ref 31.5–35.7)
MCV: 83 fL (ref 79–97)
Platelets: 269 10*3/uL (ref 150–450)
RBC: 4.52 x10E6/uL (ref 3.77–5.28)
RDW: 14.9 % (ref 11.7–15.4)
WBC: 7.7 10*3/uL (ref 3.4–10.8)

## 2024-04-22 LAB — IRON,TIBC AND FERRITIN PANEL
Ferritin: 72 ng/mL (ref 15–150)
Iron Saturation: 16 % (ref 15–55)
Iron: 40 ug/dL (ref 27–159)
Total Iron Binding Capacity: 258 ug/dL (ref 250–450)
UIBC: 218 ug/dL (ref 131–425)

## 2024-04-22 LAB — HEMOGLOBIN A1C
Est. average glucose Bld gHb Est-mCnc: 120 mg/dL
Hgb A1c MFr Bld: 5.8 % — ABNORMAL HIGH (ref 4.8–5.6)

## 2024-04-25 ENCOUNTER — Ambulatory Visit: Payer: Self-pay | Admitting: Internal Medicine

## 2024-04-28 ENCOUNTER — Other Ambulatory Visit: Payer: Self-pay | Admitting: *Deleted

## 2024-04-28 DIAGNOSIS — I8393 Asymptomatic varicose veins of bilateral lower extremities: Secondary | ICD-10-CM

## 2024-04-28 DIAGNOSIS — L659 Nonscarring hair loss, unspecified: Secondary | ICD-10-CM | POA: Insufficient documentation

## 2024-04-29 ENCOUNTER — Ambulatory Visit: Payer: Self-pay | Admitting: Dietician

## 2024-05-04 ENCOUNTER — Encounter: Payer: Self-pay | Admitting: Dietician

## 2024-05-04 ENCOUNTER — Encounter: Payer: Self-pay | Attending: Surgery | Admitting: Dietician

## 2024-05-04 VITALS — Ht 65.0 in | Wt 233.3 lb

## 2024-05-04 DIAGNOSIS — E669 Obesity, unspecified: Secondary | ICD-10-CM | POA: Insufficient documentation

## 2024-05-04 DIAGNOSIS — N182 Chronic kidney disease, stage 2 (mild): Secondary | ICD-10-CM | POA: Diagnosis present

## 2024-05-04 NOTE — Progress Notes (Addendum)
 Medical Nutrition Therapy  Appointment Start time:  1102  Appointment End time:  1200  Primary concerns today: weight gain past 6 months Referral diagnosis: Obesity Preferred learning style: no preference indicated (auditory, visual, hands on, no preference indicated) Learning readiness: preparation (not ready, contemplating, ready, change in progress)   NUTRITION ASSESSMENT  Height: 65 in  Body Composition Scale 05/04/2024  Current Body Weight 233.3  Total Body Fat % 44.1  Visceral Fat 15  Fat-Free Mass % 55.8   Total Body Water % 42.4  Muscle-Mass lbs 31.0  BMI 38.5  Body Fat Displacement          Torso  lbs 63.8         Left Leg  lbs 12.7         Right Leg  lbs 12.7         Left Arm  lbs 6.3         Right Arm   bs 6.3   Clinical Medical Hx: sleeve gastrectomy, cancer, HTN, hypercholesterolemia, obesity,  Medications: tums for reflux Labs: Chol 201; LDL 127; A1c 5.8; TSH 1.79 Notable Signs/Symptoms: nothing noted  Lifestyle & Dietary Hx  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 Pt states she has had gradual weight gain over the past 6 months and limited physical activity due to pain in right foot, knee and left shoulder. Pt states some days it is tough to get her shirt on, due to the pain in shoulder. Pt states she cut a vein in her right foot and found out a year later that she cut the tendon as well. Pt states she fell last year and sprained her ankle (left). Pt states she has an appointment for the foot doctor and states she gets Cortizone shots in her knees. Pt states she started taking a bariatric multivitamin about a week ago.  Estimated daily fluid intake: 48-64 oz Protein  Supplements: bariatric multivitamin Current average weekly physical activity: ADLs due to pain  24-Hr Dietary Recall First Meal: skip Snack: blueberries Second Meal: fish plate with fries (half) Snack: doritos (a few) Third Meal: protein shake Snack:  Beverages: coffee, water, vitamin water  (also Bai), un-sweet tea  NUTRITION DIAGNOSIS  Overweight/obesity ([place]-3.3) related to past poor dietary habits and physical inactivity as evidenced by completed bariatric surgery and following dietary guidelines for continued weight loss and healthy nutrition status.   NUTRITION INTERVENTION  Nutrition education (E-1) on the following topics:   Adequate protein promotes satiety, reducing hunger. Furthermore, is a crucial macronutrient for healing, preventing deficiencies, and sustaining weight loss by minimizing muscle loss and maintaining metabolism. Encouraged patient to honor their body's internal hunger and fullness cues.  Throughout the day, check in mentally and rate hunger. Stop eating when satisfied not full regardless of how much food is left on the plate.  Get more if still hungry 20-30 minutes later.  The key is to honor satisfaction so throughout the meal, rate fullness factor and stop when comfortably satisfied not physically full. The key is to honor hunger and fullness without any feelings of guilt or shame.  Pay attention to what the internal cues are, rather than any external factors. This will enhance the confidence you have in listening to your own body and following those internal cues enabling you to increase how often you eat when you are hungry not out of appetite and stop when you are satisfied not full.  Encouraged pt to continue to eat balanced meals inclusive of non starchy  vegetables 2 times a day 7 days a week Encouraged pt to continue to drink a minium 64 fluid ounces with half being plain water to satisfy proper hydration   Eating throughout the day in small, frequent meals is essential for several reasons. The significantly reduced stomach size means patients can only consume tiny portions at a time, making it impossible to meet nutritional needs with just three traditional meals. Spreading food intake across 5-6 smaller meals ensures adequate protein consumption, crucial  for healing, preserving muscle mass, and maintaining metabolism. This consistent fueling also helps prevent extreme hunger, which can lead to overeating or poor food choices, while stabilizing blood sugar levels and minimizing discomfort like nausea or dumping syndrome. Ultimately, frequent small meals are key to optimizing nutrient absorption, sustaining energy, and supporting long-term weight management success. Iron is an essential mineral vital for many bodily functions, most notably for forming hemoglobin, the protein in red blood cells that carries oxygen from the lungs to all parts of the body. It's also crucial for muscle function, energy production, neurological development, and immune health.  Handouts Provided Include  Standard Prep Plan Advancement Guide (review) List of High Iron Foods (Nutrition Care Manual) Bariatric MyPlate  Learning Style & Readiness for Change Teaching method utilized: Visual & Auditory  Demonstrated degree of understanding via: Teach Back  Barriers to learning/adherence to lifestyle change: mobility  Goals Established by Pt Do not skip meal; eat every 3-5 hours Aim for a protein at each meal or snack Just take the bariatric multivitamin (already has biotin)  MONITORING & EVALUATION Dietary intake, weekly physical activity.  Next Steps  Patient is to return in 2 months for follow-up.

## 2024-05-11 ENCOUNTER — Other Ambulatory Visit: Payer: Self-pay | Admitting: Internal Medicine

## 2024-05-11 DIAGNOSIS — R7989 Other specified abnormal findings of blood chemistry: Secondary | ICD-10-CM

## 2024-05-12 ENCOUNTER — Ambulatory Visit (HOSPITAL_COMMUNITY)
Admission: RE | Admit: 2024-05-12 | Discharge: 2024-05-12 | Disposition: A | Discharge status: Home / Self Care | Payer: Self-pay | Source: Ambulatory Visit | Attending: Vascular Surgery | Admitting: Vascular Surgery

## 2024-05-12 DIAGNOSIS — I8393 Asymptomatic varicose veins of bilateral lower extremities: Secondary | ICD-10-CM | POA: Insufficient documentation

## 2024-05-13 ENCOUNTER — Encounter: Payer: Self-pay | Admitting: Podiatry

## 2024-05-13 ENCOUNTER — Ambulatory Visit (INDEPENDENT_AMBULATORY_CARE_PROVIDER_SITE_OTHER)

## 2024-05-13 ENCOUNTER — Ambulatory Visit: Admitting: Podiatry

## 2024-05-13 DIAGNOSIS — M7741 Metatarsalgia, right foot: Secondary | ICD-10-CM

## 2024-05-13 DIAGNOSIS — M722 Plantar fascial fibromatosis: Secondary | ICD-10-CM

## 2024-05-13 DIAGNOSIS — M76811 Anterior tibial syndrome, right leg: Secondary | ICD-10-CM

## 2024-05-13 MED ORDER — METHYLPREDNISOLONE 4 MG PO TBPK: ORAL_TABLET | ORAL | 0 refills | Status: DC

## 2024-05-13 MED ORDER — TRIAMCINOLONE ACETONIDE 40 MG/ML IJ SUSP
20.0000 mg | Freq: Once | INTRAMUSCULAR | Status: AC
  Administered 2024-05-13: 20 mg

## 2024-05-13 MED ORDER — DICLOFENAC SODIUM 75 MG PO TBEC: 75.0000 mg | DELAYED_RELEASE_TABLET | Freq: Two times a day (BID) | ORAL | 1 refills | Status: DC

## 2024-05-13 NOTE — Progress Notes (Signed)
 She presents today chief concern of pain to the anterior ankle where she had her tibialis anterior tendon repaired several years ago now.  She is has been aching for the past couple of months she goes on to say that she has been having some pain on the side of the foot laterally and medially as well as plantar medial.  Denies any trauma.  Objective: Vital signs are stable alert oriented x 3.  Pulses are palpable.  There is no erythema edema salines drainage noted though she does have some mild fluid on palpation of the tibialis anterior tendon at the proximal retinacula.  She also has moderate to severe pain on palpation medial calcaneal tubercle.  Radiographs taken today do not demonstrate any type of osseous abnormalities other than she does have some soft tissue increase in density plantar fascial calcaneal insertion site.  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   Plan: Discussed etiology pathology conservative versus surgical therapies at this point started on methylprednisolone  and followed by diclofenac .  And I also injected her right heel and we discussed appropriate shoe gear.

## 2024-05-25 ENCOUNTER — Ambulatory Visit: Admitting: Podiatry

## 2024-06-10 ENCOUNTER — Ambulatory Visit: Payer: Self-pay | Attending: Vascular Surgery | Admitting: Physician Assistant

## 2024-06-10 VITALS — BP 138/81 | HR 73 | Temp 97.2°F | Resp 18 | Ht 65.0 in | Wt 236.3 lb

## 2024-06-10 DIAGNOSIS — I8393 Asymptomatic varicose veins of bilateral lower extremities: Secondary | ICD-10-CM | POA: Diagnosis not present

## 2024-06-10 NOTE — Progress Notes (Signed)
 Requested by:  Jarold Medici, MD [address] [place],  [place] [address]  Reason for consultation: venous insufficiency    History of Present Illness   Dana Vasquez is a 58 y.o. ([birth year]) female who presents for evaluation of venous insufficiency. She was previously evaluated by our clinic in 2022 for asymptomatic varicose veins in the left leg. At today's visit she is concerned about frequent and easy bruising of her legs and tenderness to touch.  She has noticed this over the past year or so.  She says that it is hard for her to wear compression stockings and even regular socks sometimes due to her leg tenderness.  She is also concerned about new varicose veins in her right lower leg.  These do not cause her any pain or discomfort.  She also endorses occasional issues with right ankle swelling due to previous ankle trauma.  She denies any other lower extremity swelling.  Her varicose veins on the left calf remain asymptomatic.  She denies any history of DVT or spontaneous bleeding events.  She does not elevate her legs.  She occasionally wears compression stockings.  Past Medical History:  Diagnosis Date   Allergy    Anemia    Arthritis    knees  AND SHOULDERS   Asthma    allergy related - rarely uses inhaler   Bruises easily    Bunion    Cancer (HCC)    1995;thyroid cancer, SURGERY AND RADIOACTIVE IODINE   Ectopic pregnancy    Euthyroid    Fatigue    Fibromyalgia    GERD (gastroesophageal reflux disease)    Headache(784.0)    otc med prn - last one 02/2014   History of blood transfusion 2000   In [place], [place] at [place] - ? 4 units transfused   Hx of adenomatous and sessile serrated colonic polyps 12/24/2022   Diminutive ssp and adenoma 3/24 - recall 2031   Hypothyroidism    Knee pain    Nausea and vomiting 09/27/2023   Plantar fasciitis    Sleep apnea    uses CPAP   SVD (spontaneous vaginal delivery)    x 3   Thyroid cancer (HCC)    per pt  1995, thyroid was removed   Thyroid disease    thyroidectomy 1995    Past Surgical History:  Procedure Laterality Date   ABDOMINAL HYSTERECTOMY N/A 04/20/2014   Procedure: Total ABDOMINAL HYSTERECTOMY partial right salpingectomy;  Surgeon: Shanda SHAUNNA Muscat, MD;  Location: [facility];  Service: Gynecology;  Laterality: N/A;   BREATH TEK H PYLORI  09/18/2011   Procedure: BREATH TEK H PYLORI;  Surgeon: Donnice KATHEE Lunger, MD;  Location: [facility];  Service: General;  Laterality: N/A;   BUNIONECTOMY     left;2015   BUNIONECTOMY     COLONOSCOPY     DILATION AND CURETTAGE OF UTERUS  1988   endometriosis   ECTOPIC PREGNANCY SURGERY     fallopian tubes removed     left tube removed per patient - laparotomy   FOOT SURGERY Right 2019   FOOT SURGERY Bilateral 2021   LAPAROSCOPIC GASTRIC SLEEVE RESECTION N/A 04/14/2018   Procedure: LAPAROSCOPIC GASTRIC SLEEVE RESECTION WITH UPPER ENDO AND ERAS PATHWAY;  Surgeon: Lunger Donnice, MD;  Location: [facility];  Service: General;  Laterality: N/A;   LYSIS OF ADHESION N/A 04/20/2014   Procedure: LYSIS OF ADHESION;  Surgeon: Shanda SHAUNNA Muscat, MD;  Location: [facility];  Service: Gynecology;  Laterality: N/A;  THYROIDECTOMY  1995   TUBAL LIGATION     WISDOM TOOTH EXTRACTION      Social History   Socioeconomic History   Marital status: Married    Spouse name: Not on file   Number of children: 3   Years of education: Not on file   Highest education level: Not on file  Occupational History   Not on file  Tobacco Use   Smoking status: Never   Smokeless tobacco: Never  Vaping Use   Vaping status: Never Used  Substance and Sexual Activity   Alcohol use: Yes    Alcohol/week: 0.0 standard drinks of alcohol    Comment: occ   Drug use: No   Sexual activity: Yes    Birth control/protection: Surgical  Other Topics Concern   Not on file  Social History Narrative   Not on file   Social Drivers of Health   Financial Resource Strain: Not on file  Food  Insecurity: No Food Insecurity (09/01/2023)   Received from [facility]   Hunger Vital Sign    Within the past 12 months, you worried that your food would run out before you got the money to buy more.: Never true    Within the past 12 months, the food you bought just didn't last and you didn't have money to get more.: Never true  Transportation Needs: No Transportation Needs (09/01/2023)   Received from [facility] - Transportation    Lack of Transportation (Medical): No    Lack of Transportation (Non-Medical): No  Physical Activity: Not on file  Stress: No Stress Concern Present (09/01/2023)   Received from [facility] - Occupational Stress Questionnaire    Feeling of Stress : Only a little  Social Connections: Unknown (08/31/2023)   [facility]   Social Network    Social Network: Not on file  Intimate Partner Violence: Not At Risk (09/16/2023)   Received from [facility]   HITS    Over the last 12 months how often did your partner physically hurt you?: Never    Over the last 12 months how often did your partner insult you or talk down to you?: Never    Over the last 12 months how often did your partner threaten you with physical harm?: Never    Over the last 12 months how often did your partner scream or curse at you?: Never    Family History  Problem Relation Age of Onset   Hypertension Mother    Cancer Mother        uterine   Cancer Father    Cancer Brother        stomach; 31   Early death Brother    Stroke Maternal Uncle    Heart disease Maternal Uncle    Food Allergy Daughter    Allergic rhinitis Daughter    Allergic rhinitis Daughter    Angioedema Neg Hx    Asthma Neg Hx    Eczema Neg Hx    Immunodeficiency Neg Hx    Urticaria Neg Hx    Atopy Neg Hx    Colon cancer Neg Hx    Rectal cancer Neg Hx     Current Outpatient Medications  Medication Sig Dispense Refill   albuterol (VENTOLIN  HFA) 108 (90 Base) MCG/ACT inhaler Inhale 2 puffs into the lungs every 6 (six) hours as needed for wheezing or shortness of breath. 54 g 3  diclofenac (VOLTAREN) 75 MG EC tablet Take 1 tablet (75 mg total) by mouth 2 (two) times daily. 60 tablet 1   EPINEPHrine (AUVI-Q) 0.3 mg/0.3 mL IJ SOAJ injection Inject 0.3 mg into the muscle as needed for anaphylaxis. As directed for life-threatening allergic reactions 2 each 2   famotidine (PEPCID) 10 MG tablet Take 10 mg by mouth daily.     glucose blood (CONTOUR TEST) test strip Use as directed to check blood sugars once daily E11.69 100 each 2   hydrocortisone (ANUSOL-HC) 2.5 % rectal cream Place 1 Application rectally 2 (two) times daily. 30 g 0   ipratropium (ATROVENT) 0.06 % nasal spray 1-2 sprays in each nostril up to three times per day as needed for runny nose/post nasal drip 15 mL 5   levocetirizine (XYZAL) 5 MG tablet Take 1 tablet (5 mg total) by mouth every evening. (Patient taking differently: Take 5 mg by mouth as needed.) 90 tablet 1   levothyroxine (SYNTHROID) 112 MCG tablet TAKE 1 TABLET(112 MCG) BY MOUTH DAILY BEFORE BREAKFAST 90 tablet 2   methylcellulose (CITRUCEL) oral powder Take 1 packet by mouth daily.     methylPREDNISolone (MEDROL DOSEPAK) 4 MG TBPK tablet 6 day dose pack - take as directed 21 tablet 0   Microlet Lancets MISC Use as directed to check blood sugars once daily E11.69 100 each 2   polyethylene glycol (MIRALAX) 17 g packet Take 17 g by mouth daily.     No current facility-administered medications for this visit.    Allergies  Allergen Reactions   Sulfa Antibiotics Swelling   Amoxicillin Other (See Comments)    Headache Has patient had a PCN reaction causing immediate rash, facial/tongue/throat swelling, SOB or lightheadedness with hypotension: No Has patient had a PCN reaction causing severe rash involving mucus membranes or skin necrosis: No Has patient had a PCN reaction that required hospitalization: No Has  patient had a PCN reaction occurring within the last 10 years: Unknown If all of the above answers are NO, then may proceed with Cephalosporin use.    Nitrofurantoin Monohyd Macro Other (See Comments)    Pt stated she gets a bad yeast infection   Trimethoprim Other (See Comments)   Dexilant [Dexlansoprazole]     bad stomachache   Meloxicam     Upset stomach and stomach pains   Shellfish-Derived Products     Other Reaction(s): Unknown   Budesonide Rash   Formoterol Rash   Formoterol Fumarate Rash    REVIEW OF SYSTEMS (negative unless checked):   Cardiac:  []  Chest pain or chest pressure? []  Shortness of breath upon activity? []  Shortness of breath when lying flat? []  Irregular heart rhythm?  Vascular:  []  Pain in calf, thigh, or hip brought on by walking? []  Pain in feet at night that wakes you up from your sleep? []  Blood clot in your veins? [x]  Leg swelling?  Pulmonary:  []  Oxygen at home? []  Productive cough? []  Wheezing?  Neurologic:  []  Sudden weakness in arms or legs? []  Sudden numbness in arms or legs? []  Sudden onset of difficult speaking or slurred speech? []  Temporary loss of vision in one eye? []  Problems with dizziness?  Gastrointestinal:  []  Blood in stool? []  Vomited blood?  Genitourinary:  []  Burning when urinating? []  Blood in urine?  Psychiatric:  []  Major depression  Hematologic:  []  Bleeding problems? []  Problems with blood clotting?  Dermatologic:  []  Rashes or ulcers?  Constitutional:  []  Fever or chills?  Ear/Nose/Throat:  []  Change in hearing? []  Nose bleeds? []  Sore throat?  Musculoskeletal:  []  Back pain? []  Joint pain? []  Muscle pain?   Physical Examination     Vitals:   06/10/24 0912  BP: 138/81  Pulse: 73  Resp: 18  Temp: (!) 97.2 F (36.2 C)  TempSrc: Temporal  Weight: 236 lb 4.8 oz (107.2 kg)  Height: 5' 5 (1.651 m)   Body mass index is 39.32 kg/m.  General:  WDWN in NAD; vital signs  documented above Gait: Not observed HENT: WNL, normocephalic Pulmonary: normal non-labored breathing , without Rales, rhonchi,  wheezing Cardiac: regular Abdomen: soft, NT, no masses Skin: without rashes Vascular Exam/Pulses: 2+ DP/PT pulses bilaterally Extremities: BLE with varicose veins, without reticular veins, without edema, without stasis pigmentation, without lipodermatosclerosis, without ulcers Musculoskeletal: no muscle wasting or atrophy  Neurologic: A&O X 3;  No focal weakness or paresthesias are detected Psychiatric:  The pt has Normal affect.  Non-invasive Vascular Imaging   RLE Venous Insufficiency Duplex (05/12/2024):  ------------+--------+  RIGHT        Reflux NoRefluxReflux TimeDiameter cmsComments                          Yes                                   +--------------+---------+------+-----------+------------+--------+  CFV          no                                              +--------------+---------+------+-----------+------------+--------+  FV prox       no                                              +--------------+---------+------+-----------+------------+--------+  FV mid        no                                              +--------------+---------+------+-----------+------------+--------+  FV dist       no                                              +--------------+---------+------+-----------+------------+--------+  Popliteal    no                                              +--------------+---------+------+-----------+------------+--------+  GSV at [facility]    no                            0.44              +--------------+---------+------+-----------+------------+--------+  GSV prox thighno  0.27              +--------------+---------+------+-----------+------------+--------+  GSV mid thigh no                            0.21               +--------------+---------+------+-----------+------------+--------+  GSV dist thighno                            0.22              +--------------+---------+------+-----------+------------+--------+  GSV at knee   no                            0.10              +--------------+---------+------+-----------+------------+--------+  GSV prox calf no                            0.12              +--------------+---------+------+-----------+------------+--------+  GSV mid calf  no                            0.11              +--------------+---------+------+-----------+------------+--------+  GSV dist calf no                            0.10              +--------------+---------+------+-----------+------------+--------+  SSV at [facility]    no                            0.30              +--------------+---------+------+-----------+------------+--------+  SSV prox calf no                            0.17              +--------------+---------+------+-----------+------------+--------+    Medical Decision Making   Dana Vasquez is a 58 y.o. female who presents for evaluation of venous insufficiency  Based on the patient's duplex, there is no evidence of venous insufficiency in the right lower extremity.  There is also no evidence of DVT or SVT The patient has been seen at our office in 2022 for evaluation of asymptomatic left lower extremity varicose veins.  She was reassured at that time that these do not pose any threat to her health.  She returns today for repeat evaluation of new onset right lower extremity varicose veins and generalized leg tenderness and frequent bruising. In regard to her varicose veins of bilateral lower extremities, they still do not cause her any pain or discomfort.  She has no spontaneous bleeding events from these veins either. The patient states over the past year or so she has noticed frequent/easy bruising of her legs and  generalized leg tenderness.  She is unaware of any aggravating or alleviating factors.  She does have a history of fibromyalgia.  She does occasionally have right ankle edema due to previous trauma, otherwise she has no significant lower extremity swelling. On exam she  has palpable pedal pulses bilaterally.  She has a few small varicose veins on both of her lower legs.  She has no significant lower extremity swelling. I reassured the patient that she has no evidence of venous insufficiency in the right lower extremity.  Again I also reassured the patient that her varicose veins do not pose any threat to her health.  I do not think that these are the cause of her leg tenderness either.  She has no focal area of pain or discomfort surrounding her varicose veins.  Overall I would consider other sources as the cause of her lower extremity tenderness, including her known fibromyalgia She can follow-up with her office as needed    Ahmed SHAUNNA Holster, PA-C [facility] Office: [phone]  06/10/2024, 2:20 PM  Clinic MD: [place]

## 2024-06-24 ENCOUNTER — Ambulatory Visit: Admitting: Podiatry

## 2024-07-02 ENCOUNTER — Encounter: Payer: Self-pay | Admitting: Internal Medicine

## 2024-07-06 ENCOUNTER — Ambulatory Visit: Admitting: Dietician

## 2024-07-29 ENCOUNTER — Ambulatory Visit: Admitting: Internal Medicine

## 2024-08-16 ENCOUNTER — Ambulatory Visit: Admitting: Podiatry

## 2024-08-23 NOTE — Patient Instructions (Incomplete)
 Sore throat  Component of nasal drainage that worsens symptoms as well as with VCD  Laryngeal spasm/vocal cord dysfunction (VCD): - upper airway spasm (wheezing/shortness of breath arising from the upper chest/throat, lack of wheezing, symptoms not worse at night, sudden onset with rapid recovery, inconsistent/absent/immediate response to albuterol  and/or other asthma inhalers, lack of other allergy symptoms, age of onset, difficulty with inhalation as opposed to exhalation all speak against asthma as a cause of symptoms) - uncontrolled reflux and post-nasal drainage can make VCD worse and can even be the sole cause of symptoms - continue  Atrovent  (ipratropium) 1-2 sprays in each nostril up to three times per day as needed for runny nose/post nasal drip (if your nose becomes too dry, please use less) - continue VCD exercises first for breathing symptoms as recommended by ENT speech therapy - for symptoms not improved with breathing exercises, ok to use Albuterol    Asthma - Continue albuterol  2 puffs once every 4 hours or albuterol  1 vial via nebulizer as needed for cough or wheeze - You may use albuterol  2 puffs 5 to 15 minutes before activity to decrease cough or wheeze - If increase in albuterol  then will need to consider adding in controller medication like Singulair Breathing control goals:  Full participation in all desired activities (may need albuterol  before activity) Albuterol  use two time or less a week on average (not counting use with activity) Cough interfering with sleep two time or less a month Oral steroids no more than once a year No hospitalizations  Allergic rhinitis - Continue dust mite and weed pollen avoidance measures   -Continue Levocetirizine (Xyzal ) 5mg  daily as needed.   If needing to change Xyzal  would change to Allegra  - If medications do not control your symptoms, consider allergen immunotherapy  Reflux She declines trying another reflux medication  due to  omeprazole and Dexilant causing severe abdominal pain Recommend that she follow up with GI to discuss other options for her not well controlled reflux  Will put in a STAT referral to pulmonology due to abnormal PET scan on 08/12/24   Follow up in about 4-6 weeks with Dr. Jeneal or sooner if needed.

## 2024-08-24 ENCOUNTER — Ambulatory Visit: Admitting: Family

## 2024-08-24 ENCOUNTER — Encounter: Payer: Self-pay | Admitting: Family

## 2024-08-24 ENCOUNTER — Telehealth: Payer: Self-pay | Admitting: Family

## 2024-08-24 ENCOUNTER — Other Ambulatory Visit: Payer: Self-pay

## 2024-08-24 VITALS — BP 126/88 | HR 72 | Temp 97.6°F

## 2024-08-24 DIAGNOSIS — J029 Acute pharyngitis, unspecified: Secondary | ICD-10-CM

## 2024-08-24 DIAGNOSIS — J4520 Mild intermittent asthma, uncomplicated: Secondary | ICD-10-CM

## 2024-08-24 DIAGNOSIS — J383 Other diseases of vocal cords: Secondary | ICD-10-CM

## 2024-08-24 DIAGNOSIS — R918 Other nonspecific abnormal finding of lung field: Secondary | ICD-10-CM

## 2024-08-24 DIAGNOSIS — J3089 Other allergic rhinitis: Secondary | ICD-10-CM

## 2024-08-24 MED ORDER — IPRATROPIUM BROMIDE 0.06 % NA SOLN: NASAL | 5 refills | Status: AC

## 2024-08-24 NOTE — Progress Notes (Signed)
 [address] [place] [address] Dept: [phone]  FOLLOW UP NOTE  Patient ID: Dana Vasquez, female    DOB: 1966-01-13  Age: 58 y.o. MRN: [id number] Date of Office Visit: 08/24/2024  Assessment  Chief Complaint: Follow-up (Allergies/Asthma/), Wheezing, and Nasal Congestion (Sinus drainage yellow mucous  )  HPI Dana Vasquez is a 58 year old female who presents today for follow-up of sore throat, laryngeal spasm/vocal cord dysfunction, asthma, and allergic rhinitis.  She was last seen on September 18, 2023 by Dr. Jeneal.  She reports that her smoldering multiple myeloma is stable.  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  She did not take the antibiotic because she thought she was getting better.  She then ended up taking it in September because she was having a lot of phlegm.  She has seen her oncologist and was told that her numbers look good.  Then on October 16 she reports that she had a PET scan and was told that she is healing from pneumonia.  She  did not know she ever had pneumonia. Her PET scan from 08/12/24 showed: IMPRESSION:   1) No lytic lesions with high-grade metabolic activity definitive for  myeloma. Small area of asymmetric marrow activity at the left femoral  diaphysis without CT correlate is indeterminate. Attention to this area on  follow-up scans is recommended.   2) Several suspected benign findings are noted with some associated  metabolic activity. Attention to these areas on follow-up scans is  recommended.  - New area of clustered tree-in-bud nodularity in the right upper lobe  demonstrates uptake above blood pool. Infectious or inflammatory etiology  is favored.  - New subcentimeter nodule in the right flank with uptake above blood pool.  Suspect inflammatory etiology (e.g. injection medication site).  -New activity above blood pool and subcentimeter right axillary nodes,  favored reactive.    This weekend she reports that she felt bad, but is feeling better and is not as bad.  Over the weekend she went to her daughters and her nose was running and she was stopped up and achy.  She did not feel like doing anything.  She does mention that her grandkids go to daycare.  This morning she did cough up some yellow mucus.  She has had a headache off and on for 3 weeks.  This morning her throat felt scratchy and a little sore.  Today she denies any runny nose or stuffy nose.  She denies postnasal drip.  Last night she did have to use a Kleenex because she was having a constant runny nose.  On Saturday she did have some chills but denies any fever.  She has not had any chills since.  She does take Xyzal  5 mg as needed, but it makes her sleepy.  She also reports that she uses a nasal spray.  After discussing she is using ipratropium bromide  nasal spray in the morning.  2 weeks ago she reports that she coughed up some blood.  Laryngeal spasm/vocal cord dysfunction/asthma: She reports shortness of breath sometimes.  A lot of times she does not feel like her albuterol  helps, but then when she does her vocal cord exercises they do help.  She denies coughing, wheezing, and tightness in her chest.  She uses her albuterol  2-3 times a week.  She does notice that she feels like choking all the time are hard to breathe especially if lying  flat.  She has to prop herself up.  She reports that she has seen cardiology in the past 2 to 3 years.  Recommended that she follow-up with her cardiologist to let them know about her shortness of breath while lying flat.  She cannot remember if she has been on any steroids since her last office visit.  She has maybe had 1 from a orthopedic doctor due to her muscles.  She has not made any trips to the emergency room or urgent care due to breathing problems.  Reflux: Reports that she will have symptoms approximately 3-4 times a week and will take Tums.  She feels like she has taken  a medicine before in the past, but cannot remember.  Discussed starting omeprazole.  After the visit was over it was found that she had Dexilant listed as an allergy.  It is listed as Dexilant causing severe abdominal pain.  Contacted the patient via telephone and she said yes I remember that and I also remember I have tried omeprazole and that is what has caused much abdominal pain.  She reports that she has spoken with her GI doctor about this and that also earlier on this year she was diagnosed with diverticulitis  after having a colonoscopy.  I recommended that she contact her gastroenterologist for other options to help treat her reflux.   Drug Allergies:  Allergies  Allergen Reactions   Sulfa Antibiotics Swelling   Amoxicillin Other (See Comments)    Headache Has patient had a PCN reaction causing immediate rash, facial/tongue/throat swelling, SOB or lightheadedness with hypotension: No Has patient had a PCN reaction causing severe rash involving mucus membranes or skin necrosis: No Has patient had a PCN reaction that required hospitalization: No Has patient had a PCN reaction occurring within the last 10 years: Unknown If all of the above answers are NO, then may proceed with Cephalosporin use.    Nitrofurantoin Monohyd Macro Other (See Comments)    Pt stated she gets a bad yeast infection   Trimethoprim Other (See Comments)   Dexilant [Dexlansoprazole]     bad stomachache   Meloxicam      Upset stomach and stomach pains   Shellfish Protein-Containing Drug Products     Other Reaction(s): Unknown   Budesonide Rash   Formoterol Rash   Formoterol Fumarate Rash    Review of Systems: Negative except as per HPI   Physical Exam: BP 126/88   Pulse 72   Temp 97.6 F (36.4 C)   LMP 04/07/2014   SpO2 99%    Physical Exam Constitutional:      Appearance: Normal appearance.  HENT:     Head: Normocephalic and atraumatic.     Comments: Pharynx normal. Eyes normal. Ears  normal. Nose normal    Right Ear: Tympanic membrane, ear canal and external ear normal.     Left Ear: Tympanic membrane, ear canal and external ear normal.     Nose: Nose normal.     Mouth/Throat:     Mouth: Mucous membranes are moist.     Pharynx: Oropharynx is clear.  Eyes:     Conjunctiva/sclera: Conjunctivae normal.  Cardiovascular:     Rate and Rhythm: Regular rhythm.     Heart sounds: Normal heart sounds.  Pulmonary:     Effort: Pulmonary effort is normal.     Breath sounds: Normal breath sounds.     Comments: Lungs clear to auscultation Musculoskeletal:     Cervical back: Neck supple.  Skin:  General: Skin is warm.  Neurological:     Mental Status: She is alert and oriented to person, place, and time.  Psychiatric:        Mood and Affect: Mood normal.        Behavior: Behavior normal.        Thought Content: Thought content normal.        Judgment: Judgment normal.     Diagnostics: FVC 2.22 L (79%), FEV1 1.83 L (82%), FEV1/FVC 0.82.  Spirometry indicates normal spirometry.  Assessment and Plan: 1. Seasonal allergic rhinitis due to other allergic trigger   2. Mild intermittent asthma, uncomplicated   3. Sore throat   4. Vocal cord dysfunction     Meds ordered this encounter  Medications   ipratropium (ATROVENT ) 0.06 % nasal spray    Sig: 1-2 sprays in each nostril up to three times per day as needed for runny nose/post nasal drip    Dispense:  15 mL    Refill:  5    Patient Instructions  Sore throat  Component of nasal drainage that worsens symptoms as well as with VCD  Laryngeal spasm/vocal cord dysfunction (VCD): - upper airway spasm (wheezing/shortness of breath arising from the upper chest/throat, lack of wheezing, symptoms not worse at night, sudden onset with rapid recovery, inconsistent/absent/immediate response to albuterol  and/or other asthma inhalers, lack of other allergy symptoms, age of onset, difficulty with inhalation as opposed to exhalation  all speak against asthma as a cause of symptoms) - uncontrolled reflux and post-nasal drainage can make VCD worse and can even be the sole cause of symptoms - continue  Atrovent  (ipratropium) 1-2 sprays in each nostril up to three times per day as needed for runny nose/post nasal drip (if your nose becomes too dry, please use less) - continue VCD exercises first for breathing symptoms as recommended by ENT speech therapy - for symptoms not improved with breathing exercises, ok to use Albuterol    Asthma - Continue albuterol  2 puffs once every 4 hours or albuterol  1 vial via nebulizer as needed for cough or wheeze - You may use albuterol  2 puffs 5 to 15 minutes before activity to decrease cough or wheeze - If increase in albuterol  then will need to consider adding in controller medication like Singulair Breathing control goals:  Full participation in all desired activities (may need albuterol  before activity) Albuterol  use two time or less a week on average (not counting use with activity) Cough interfering with sleep two time or less a month Oral steroids no more than once a year No hospitalizations  Allergic rhinitis - Continue dust mite and weed pollen avoidance measures   -Continue Levocetirizine (Xyzal ) 5mg  daily as needed.   If needing to change Xyzal  would change to Allegra  - If medications do not control your symptoms, consider allergen immunotherapy  Reflux Recommend that she follow up with GI to discuss other options for her not well controlled reflux  Will put in a STAT referral to pulmonology due to abnormal PET scan on 08/12/24   Follow up in about 4-6 weeks with Dr. Jeneal or sooner if needed.  Return in about 6 weeks (around 10/05/2024), or if symptoms worsen or fail to improve.    Thank you for the opportunity to care for this patient.  Please do not hesitate to contact me with questions.  [name], FNP [facility] 

## 2024-08-24 NOTE — Telephone Encounter (Signed)
 Please call Dana Vasquez and remind her that I would like her to contact her cardiologist about not being able to lye flat without feeling short of breath.. We discussed it in the office,but I forgot to put it in her after visit summary.  [name], FNP [facility]

## 2024-08-25 ENCOUNTER — Encounter: Payer: Self-pay | Admitting: Family

## 2024-08-25 NOTE — Telephone Encounter (Signed)
 Patient was notified in detailed of Wanda, FNP message by phone.

## 2024-08-25 NOTE — Telephone Encounter (Signed)
 Please place referral for pulmonologist.

## 2024-08-30 NOTE — Addendum Note (Signed)
 Addended by: IVA MARTY SALTNESS on: 08/30/2024 08:55 AM   Modules accepted: Orders

## 2024-09-06 ENCOUNTER — Ambulatory Visit: Payer: Self-pay

## 2024-09-06 NOTE — Telephone Encounter (Signed)
 FYI Only or Action Required?: Action required by provider: request for appointment.  Patient was last seen in primary care on 04/21/2024 by Jarold Medici, MD.  Called Nurse Triage reporting Hemoptysis.  Symptoms began several weeks ago.  Interventions attempted: Nothing.  Symptoms are: stable.  Triage Disposition: See PCP When Office is Open (Within 3 Days)  Patient/caregiver understands and will follow disposition?: Yes, but will wait                            Copied from CRM [id number]. Topic: Clinical - Red Word Triage >> Sep 06, 2024  4:46 PM Joesph NOVAK wrote: Red Word that prompted transfer to Nurse Triage: Patient spits up blood in the morning, a consistent headache.  Reason for Disposition  Cough has been present for > 3 weeks  Answer Assessment - Initial Assessment Questions This RN advised availability within 3 days. This RN offered available appointment times to patient and no available option worked with her schedule. Patient is requesting an appointment tomorrow or Wednesday morning. Please advise on scheduling. Patient would like a call back. Patient stated she was advised by oncologist to follow-up with PCP.  1. ONSET: When did the cough begin?      Sporadically over 6 weeks 2. SEVERITY: How bad is the cough today? Did the blood appear after a coughing spell?      Denies coughing spells, states coughing up mucous occurs in mornings after waking up 3. SPUTUM: Describe the color of your sputum (e.g., none, dry cough; clear, white, yellow, green)     Blood-tinged mucous 4. HEMOPTYSIS: How much blood? (e.g., flecks, streaks, tablespoons)     Thick blood (1 tsp) 3 x in past month 5. DIFFICULTY BREATHING: Are you having difficulty breathing? If Yes, ask: How bad is it? (e.g., mild, moderate, severe)      Denies 6. FEVER: Do you have a fever? If Yes, ask: What is your temperature, how was it measured, and when did it start?      Denies 7. CARDIAC HISTORY: Do you have any history of heart disease? (e.g., heart attack, congestive heart failure)      Denies 8. LUNG HISTORY: Do you have any history of lung disease?  (e.g., pulmonary embolus, asthma, emphysema)     Denies 9. PE RISK FACTORS: Do you have a history of blood clots? Note: Other risk factors include recent major surgery, recent prolonged travel, being bedridden.     Denies history of blood clots, denies recent surgery, denies recent prolonged travel  10. OTHER SYMPTOMS: Do you have any other symptoms? (e.g., runny nose, wheezing, chest pain)     Occasional chest pain- tenderness to the touch + pressure when laying flat (ongoing for 6 months to a year), constant headache, denies wheezing Leg pain- states she has issues with foot at baseline 11. PREGNANCY: Is there any chance you are pregnant? When was your last menstrual period?     N/A 12. TRAVEL: Have you traveled out of the country in the last month? (e.g., travel history, exposures)     N/A  Protocols used: Coughing Up Blood-A-AH

## 2024-09-08 NOTE — Telephone Encounter (Signed)
 Dana Vasquez is scheduled for 12/11 8:30 am with Dr. Dante

## 2024-09-09 ENCOUNTER — Encounter: Payer: Self-pay | Admitting: Podiatry

## 2024-09-09 ENCOUNTER — Ambulatory Visit: Admitting: Podiatry

## 2024-09-09 DIAGNOSIS — M19071 Primary osteoarthritis, right ankle and foot: Secondary | ICD-10-CM

## 2024-09-09 MED ORDER — DEXAMETHASONE SODIUM PHOSPHATE 120 MG/30ML IJ SOLN
2.0000 mg | Freq: Once | INTRAMUSCULAR | Status: AC
  Administered 2024-09-09: 2 mg via INTRA_ARTICULAR

## 2024-09-10 NOTE — Progress Notes (Signed)
 Belmira states that her course today the foot is starting to feel better since I was coming here but when I made the appointment my foot was absolutely killing me I can hardly put weight down on it.  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  She is here for follow-up of her plantar fasciitis right.  States it is so much better now than it was when I made the appointment but it hurts right and here she points to the dorsal and dorsal medial aspect for we repaired a tibialis anterior tendon and she is also complaining about the first metatarsal phalangeal joint area of the right big toe.  States that it hurts from here all the way back as she points to the medial longitudinal arch.  Objective: Vital signs stable alert oriented x 3.  Pulses are palpable.  No reproducible pain on palpation of the tibialis anterior tendon or the scars.  No fluctuance in this area.  She has some tenderness on palpation of the posterior tibial tendon but minimally so and again no fluctuance noted she has some tenderness on palpation of the plantar fascia but not nearly as symptomatic as it has been in the past.  The majority of her symptoms is located around the first metatarsal phalangeal joint.  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  Plan: I injected 5 mg of dexamethasone  and local anesthetic Gastrobid and Skin-Prep into the first metatarsal phalangeal joint right foot.  She tolerated procedure well.

## 2024-09-28 ENCOUNTER — Encounter: Payer: Self-pay | Admitting: Internal Medicine

## 2024-09-28 ENCOUNTER — Ambulatory Visit: Payer: Self-pay | Admitting: Internal Medicine

## 2024-09-28 VITALS — BP 120/76 | HR 75 | Temp 98.3°F | Ht 65.0 in | Wt 235.0 lb

## 2024-09-28 DIAGNOSIS — K5903 Drug induced constipation: Secondary | ICD-10-CM | POA: Diagnosis not present

## 2024-09-28 DIAGNOSIS — Z23 Encounter for immunization: Secondary | ICD-10-CM

## 2024-09-28 DIAGNOSIS — R042 Hemoptysis: Secondary | ICD-10-CM | POA: Diagnosis not present

## 2024-09-28 DIAGNOSIS — R942 Abnormal results of pulmonary function studies: Secondary | ICD-10-CM

## 2024-09-28 DIAGNOSIS — R7303 Prediabetes: Secondary | ICD-10-CM | POA: Diagnosis not present

## 2024-09-28 DIAGNOSIS — D472 Monoclonal gammopathy: Secondary | ICD-10-CM | POA: Diagnosis not present

## 2024-09-28 DIAGNOSIS — E890 Postprocedural hypothyroidism: Secondary | ICD-10-CM | POA: Diagnosis not present

## 2024-09-28 NOTE — Progress Notes (Signed)
 I,Dana Vasquez, CMA,acting as a neurosurgeon for Dana LOISE Slocumb, MD.,have documented all relevant documentation on the behalf of Dana LOISE Slocumb, MD,as directed by  Dana LOISE Slocumb, MD while in the presence of Dana LOISE Slocumb, MD.  Subjective:  Patient ID: Dana Vasquez , female    DOB: 11/13/1965 , 58 y.o.   MRN: [id number]  Chief Complaint  Patient presents with   Prediabetes   Hypothyroidism    Patient presents today for prediabetes, chol & thyroid  follow up. She reports compliance with medications. Denies headache, chest pain & sob.    Hyperlipidemia    HPI Discussed the use of AI scribe software for clinical note transcription with the patient, who gave verbal consent to proceed.  History of Present Illness Dana Vasquez is a 58 year old female with prediabetes and multiple myeloma who presents for a prediabetes check and follow-up on lung concerns.  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 She experiences respiratory symptoms including waking up with phlegm and mucus, sometimes tinged with blood, especially when lying flat. This has occurred sporadically, with the last episode about a week and a half ago. She also notes swelling on the right side of her face, which she is unsure of the cause.  She experiences pain in her right hip, although the PET scan noted indeterminate activity in her left hip.  She started Mounjaro in October for her history of diabetes, with her A1c previously reaching 6.5. This is managed by Dr. Tommas.  Her current dose is 2.5 mg, and her A1c has decreased to 5.5. She experiences constipation as a side effect and uses Miralax  for relief. She was on antibiotics prior to the PET scan due to flu-like symptoms before school started, which improved her condition. However, she initially stopped the antibiotics early, thinking she  was better, but later completed the course when symptoms persisted.  She saw a vascular specialist in August for varicose veins, which were deemed cosmetic. She wears compression hose and walks 5,000 to 8,000 steps daily.  Her thyroid  function was previously noted to be off, with a low TSH and high free T4, but she has not reported palpitations or diarrhea.  Past Medical History:  Diagnosis Date   Allergy    Anemia    Arthritis    knees  AND SHOULDERS   Asthma    allergy related - rarely uses inhaler   Bruises easily    Bunion    Cancer (HCC) 1995   1995;thyroid  cancer, SURGERY AND RADIOACTIVE IODINE   Ectopic pregnancy    Euthyroid    Fatigue    Fibromyalgia    GERD (gastroesophageal reflux disease)    Headache(784.0)    otc med prn - last one 02/2014   History of blood transfusion 2000   In [place], [place] at [facility] - ? 4 units transfused   Hx of adenomatous and sessile serrated colonic polyps 12/24/2022   Diminutive ssp and adenoma 3/24 - recall 2031   Hypothyroidism    Knee pain    Nausea and vomiting 09/27/2023   Plantar fasciitis    Sleep apnea    uses CPAP   SVD (spontaneous vaginal delivery)    x 3   Thyroid  cancer (HCC)    per pt 1995, thyroid  was removed   Thyroid  disease    thyroidectomy 1995  Family History  Problem Relation Age of Onset   Hypertension Mother    Cancer Mother        uterine   Cancer Father    Cancer Brother        stomach; 71   Early death Brother    Asthma Brother    Stroke Maternal Uncle    Heart disease Maternal Uncle    Food Allergy Daughter    Allergic rhinitis Daughter    Allergic rhinitis Daughter    Angioedema Neg Hx    Eczema Neg Hx    Immunodeficiency Neg Hx    Urticaria Neg Hx    Atopy Neg Hx    Colon cancer Neg Hx    Rectal cancer Neg Hx      Current Outpatient Medications:    albuterol  (VENTOLIN  HFA) 108 (90 Base) MCG/ACT inhaler, Inhale 2 puffs into the lungs every 6 (six) hours as needed for wheezing or  shortness of breath., Disp: 54 g, Rfl: 3   EPINEPHrine  (AUVI-Q ) 0.3 mg/0.3 mL IJ SOAJ injection, Inject 0.3 mg into the muscle as needed for anaphylaxis. As directed for life-threatening allergic reactions, Disp: 2 each, Rfl: 2   glucose blood (CONTOUR TEST) test strip, Use as directed to check blood sugars once daily E11.69, Disp: 100 each, Rfl: 2   ipratropium (ATROVENT ) 0.06 % nasal spray, 1-2 sprays in each nostril up to three times per day as needed for runny nose/post nasal drip, Disp: 15 mL, Rfl: 5   levocetirizine (XYZAL ) 5 MG tablet, Take 1 tablet (5 mg total) by mouth every evening., Disp: 90 tablet, Rfl: 1   levothyroxine  (SYNTHROID ) 112 MCG tablet, TAKE 1 TABLET(112 MCG) BY MOUTH DAILY BEFORE BREAKFAST, Disp: 90 tablet, Rfl: 2   Microlet Lancets MISC, Use as directed to check blood sugars once daily E11.69, Disp: 100 each, Rfl: 2   MOUNJARO 2.5 MG/0.5ML Pen, SMARTSIG:2.5 Milligram(s) SUB-Q Once a Week, Disp: , Rfl:    polyethylene glycol (MIRALAX ) 17 g packet, Take 17 g by mouth daily., Disp: , Rfl:    hydrocortisone  (ANUSOL -HC) 2.5 % rectal cream, Place 1 Application rectally 2 (two) times daily., Disp: 30 g, Rfl: 0   methylcellulose (CITRUCEL) oral powder, Take 1 packet by mouth daily., Disp: , Rfl:    Allergies  Allergen Reactions   Sulfa Antibiotics Swelling   Amoxicillin Other (See Comments)    Headache Has patient had a PCN reaction causing immediate rash, facial/tongue/throat swelling, SOB or lightheadedness with hypotension: No Has patient had a PCN reaction causing severe rash involving mucus membranes or skin necrosis: No Has patient had a PCN reaction that required hospitalization: No Has patient had a PCN reaction occurring within the last 10 years: Unknown If all of the above answers are NO, then may proceed with Cephalosporin use.    Nitrofurantoin Monohyd Macro Other (See Comments)    Pt stated she gets a bad yeast infection   Trimethoprim Other (See Comments)    Dexilant [Dexlansoprazole]     bad stomachache   Meloxicam      Upset stomach and stomach pains   Budesonide Rash   Formoterol Rash   Formoterol Fumarate Rash     Review of Systems  Constitutional: Negative.   Respiratory: Negative.    Cardiovascular: Negative.   Gastrointestinal: Negative.   Neurological: Negative.   Psychiatric/Behavioral: Negative.       Today's Vitals   09/28/24 1554  BP: 120/76  Pulse: 75  Temp: 98.3 F (36.8 C)  TempSrc:  Oral  Weight: 235 lb (106.6 kg)  Height: 5' 5 (1.651 m)  PainSc: 0-No pain   Body mass index is 39.11 kg/m.  Wt Readings from Last 3 Encounters:  10/07/24 232 lb 9.6 oz (105.5 kg)  09/28/24 235 lb (106.6 kg)  06/10/24 236 lb 4.8 oz (107.2 kg)    The 10-year ASCVD risk score (Arnett DK, et al., 2019) is: 7.8%   Values used to calculate the score:     Age: 76 years     Clinically relevant sex: Female     Is Non-Hispanic African American: Yes     Diabetic: Yes     Tobacco smoker: No     Systolic Blood Pressure: 124 mmHg     Is BP treated: No     HDL Cholesterol: 67 mg/dL     Total Cholesterol: 223 mg/dL  Objective:  Physical Exam Vitals and nursing note reviewed.  Constitutional:      Appearance: Normal appearance. She is obese.  HENT:     Head: Normocephalic and atraumatic.  Eyes:     Extraocular Movements: Extraocular movements intact.  Cardiovascular:     Rate and Rhythm: Normal rate and regular rhythm.     Heart sounds: Normal heart sounds.  Pulmonary:     Effort: Pulmonary effort is normal.     Breath sounds: Normal breath sounds.  Musculoskeletal:     Cervical back: Normal range of motion.  Skin:    General: Skin is warm.  Neurological:     General: No focal deficit present.     Mental Status: She is alert.  Psychiatric:        Mood and Affect: Mood normal.        Behavior: Behavior normal.         Assessment And Plan:   Assessment & Plan Prediabetes A1c improved to 5.5% on Mounjaro 2.5 mg,  previously 6.5 many years ago prior to her bariatric surgery. Will request Dr. Becki notes, I suspect she has to use DM diagnosis to get Mounjaro approved.  - Continue Mounjaro 2.5 mg. - Ensure adequate protein intake and hydration. - Encourage strengthening exercises to prevent muscle loss. - Monitor A1c levels regularly. Postoperative hypothyroidism TSH slightly low and free T4 high. - Monitor thyroid  function tests regularly. - Report any symptoms of hyperthyroidism such as palpitations or diarrhea. - Followed by Dr. 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 Indeterminate activity in left hip, right hip pain reported. Potential inflammation or infection in right lung noted. - Continue surveillance with regular follow-up scans. - Monitor for symptoms of infection or inflammation. Cough with hemoptysis Intermittent hemoptysis with right lung abnormalities on PET scan. Possible infection or inflammation. Previous antibiotic course incomplete, potential resistance. - Follow up with pulmonologist on December 11th. - Ensure completion of antibiotic courses to prevent resistance. Drug-induced constipation Constipation associated with Mounjaro use, managed with Miralax . - Continue Miralax  for constipation management. - Ensure adequate fiber and hydration intake. Abnormal PET scan of lung She is encouraged to keep upcoming Pulmonary appt on 10/07/24.  Immunization due  Morbid obesity due to excess calories (HCC) BMI 38 with underlying prediabetes, myeloma. Now on tirzepatide to help augment her weight loss efforts. Encouraged to strive to lose ten percent of her body weight to decrease cardiac risk.   Orders Placed This Encounter  Procedures   Flu vaccine trivalent PF, 6mos and older(Flulaval,Afluria,Fluarix,Fluzone)   Return for 4 month predm f/u.SABRA  Patient was given opportunity to ask questions. Patient  verbalized understanding of the plan and was able to repeat key elements of the plan. All questions were answered to their satisfaction.    I, Dana LOISE Slocumb, MD, have reviewed all documentation for this visit. The documentation on 10/09/2024 for the exam, diagnosis, procedures, and orders are all accurate and complete.   IF YOU HAVE BEEN REFERRED TO A SPECIALIST, IT MAY TAKE 1-2 WEEKS TO SCHEDULE/PROCESS THE REFERRAL. IF YOU HAVE NOT HEARD FROM US /SPECIALIST IN TWO WEEKS, PLEASE GIVE US  A CALL AT [phone].

## 2024-09-28 NOTE — Patient Instructions (Signed)
 Prediabetes: What to Know Prediabetes is when your blood sugar, also called glucose, is at a higher level than normal but not high enough for you to be diagnosed with type 2 diabetes (type 2 diabetes mellitus). Having prediabetes puts you at risk for getting type 2 diabetes. By making some healthy changes, you may be able to prevent or delay getting type 2 diabetes. This is important because type 2 diabetes can lead to serious problems. Some of these include: Heart disease. Stroke. Blindness. Kidney disease. Depression. Poor blood flow in the feet and legs. In very bad cases, this could lead to having a leg removed by surgery (amputation). What are the causes? The exact cause of prediabetes isn't known. It may result from insulin resistance. Insulin resistance happens when cells in the body don't respond properly to insulin that the body makes. This can cause too much sugar to build up in the blood. High blood sugar, also called hyperglycemia, can develop. What increases the risk? Having a family member with type 2 diabetes. Being older than 57 years of age. Having had a temporary form of diabetes during a pregnancy. This is called gestational diabetes. Having had polycystic ovary syndrome (PCOS). Being overweight or obese. Being inactive and not getting much exercise. Having a history of heart disease. This may include problems with cholesterol levels, high levels of blood fats, or high blood pressure. What are the signs or symptoms? You may have no symptoms. If you do have symptoms, they may include: Increased hunger. Increased thirst. Needing to pee more often. Changes in how you see, like blurry vision. Feeling tired. How is this diagnosed? Prediabetes can be diagnosed with blood tests that check your blood sugar. One or more of these tests may be done: A fasting blood glucose (FBG) test. You won't be allowed to eat (you will fast) for at least 8 hours before a blood sample is  taken. An A1C blood test, also called a hemoglobin A1C test. This test shows information about blood sugar levels over the past 2?3 months. An oral glucose tolerance test (OGTT). This test measures your blood sugar at two points in time: After you haven't eaten for a while. This is your baseline level. Two hours after you drink a beverage that has sugar in it. You may be diagnosed with prediabetes if: Your FBG is 100?125 mg/dL (1.6-1.0 mmol/L). Your A1C level is 5.7?6.4% (39-46 mmol/mol). Your OGTT result is 140?199 mg/dL (9.6-04 mmol/L). These blood tests may need to be done again to be sure of the diagnosis. How is this treated? Treatment may include making changes to your diet and lifestyle. These changes can help lower your blood sugar and keep you from getting type 2 diabetes. In some cases, medicine may be given to help lower your risk. Follow these instructions at home: Eating and drinking  Eat and drink as told. Follow a healthy meal plan. This includes eating lean proteins, whole grains, legumes, fresh fruits and vegetables, low-fat dairy products, and healthy fats. Meet with an expert in healthy eating called a dietitian. This person can help create a healthy eating plan that's right for you. Lifestyle Do moderate-intensity exercise. Do this for at least 30 minutes a day on 5 or more days each week, or as told by your health care provider. A mix of activities may be best. Good choices include brisk walking, swimming, biking, and weight lifting. Try to lose weight if your provider says it's OK. Losing 5-7% of your body weight can  help reverse insulin resistance. Do not drink alcohol if: Your provider tells you not to drink. You're pregnant, may be pregnant, or plan to become pregnant. If you drink alcohol: Limit how much you have to: 0-1 drink a day if you're female. 0-2 drinks a day if you're female. Know how much alcohol is in your drink. In the [place] one drink is one 12 oz  bottle of beer (355 mL), one 5 oz glass of wine (148 mL), or one 1 oz glass of hard liquor (44 mL). General instructions Take medicines only as told. You may be given medicines that help lower the risk of type 2 diabetes. Do not smoke, vape, or use nicotine or tobacco. Where to find more information American Diabetes Association: diabetes.org/about-diabetes/prediabetes Academy of Nutrition and Dietetics: eatright.org American Heart Association: Go to ThisJobs.cz. Click the search icon. Type "prediabetes" in the search box. Contact a health care provider if: You have any of these symptoms: Increased hunger. Peeing more often than usual. Increased thirst. Feeling tired. Changes in how you see, like blurry vision. Feeling like you may throw up. Throwing up. Get help right away if: You have shortness of breath. You feel confused. This information is not intended to replace advice given to you by your health care provider. Make sure you discuss any questions you have with your health care provider. Document Revised: 05/18/2023 Document Reviewed: 05/18/2023 Elsevier Patient Education  2024 [employer].

## 2024-10-07 ENCOUNTER — Inpatient Hospital Stay
Admission: RE | Admit: 2024-10-07 | Discharge: 2024-10-07 | Disposition: A | Discharge status: Home / Self Care | Payer: Self-pay | Source: Ambulatory Visit | Attending: Emergency Medicine | Admitting: Emergency Medicine

## 2024-10-07 ENCOUNTER — Ambulatory Visit: Admitting: Emergency Medicine

## 2024-10-07 ENCOUNTER — Encounter: Payer: Self-pay | Admitting: Emergency Medicine

## 2024-10-07 VITALS — BP 124/74 | HR 74 | Ht 65.0 in | Wt 232.6 lb

## 2024-10-07 DIAGNOSIS — R9389 Abnormal findings on diagnostic imaging of other specified body structures: Secondary | ICD-10-CM

## 2024-10-07 DIAGNOSIS — R918 Other nonspecific abnormal finding of lung field: Secondary | ICD-10-CM

## 2024-10-07 NOTE — Patient Instructions (Signed)
°  VISIT SUMMARY: Dana Vasquez, a 58 year old female with upper airway irritation and asthma, moldering multiple myeloma, visited for a follow-up on her chest imaging findings. A recent PET scan showed subtle changes in the right upper lobe and small right axillary nodes. She has a history of recurrent throat infections, excessive mucus, occasional blood-tinged sputum, and chest pressure. Her past medical history includes asthma, allergic rhinitis, obstructive sleep apnea, GERD, sickle cell trait, and a remote history of thyroid  cancer. She is followed by [facility] for smoldering multiple myeloma.  YOUR PLAN: -PULMONARY NODULES WITH POSSIBLE RESIDUAL INFLAMMATION: You have subtle clustered nodules in your right upper lung, which may be due to inflammation or infection. We will perform a CT scan of your chest in mid-January to further assess these nodules. We will review the CT scan results and compare them with your previous PET scan images from [facility]. Depending on the findings, we may need to conduct further testing or follow-up.  INSTRUCTIONS: Please schedule a CT scan of your chest for mid-January. We will review the results and determine if further testing or follow-up is needed.

## 2024-10-07 NOTE — Progress Notes (Signed)
 Subjective:    Patient ID: Dana Vasquez, female    DOB: 1966/02/27, 58 y.o.   MRN: [id number]  HPI Discussed the use of AI scribe software for clinical note transcription with the patient, who gave verbal consent to proceed.  History of Present Illness Dana Vasquez is a 58 year old female with asthma and smoldering multiple myeloma who presents for follow-up of chest imaging findings. She was referred by her allergist for evaluation of her chest imaging findings.  She underwent a PET scan on August 12, 2024, which showed no lytic lesions but revealed subtle clustered micronodular changes in the right upper lobe with some discrete nodules, all less than one centimeter, and a few small right axillary nodes with mild metabolic activity.  She has a history of recurrent throat infections, leading to frequent emergency room visits with negative tests for strep, flu, and COVID. Throat pain is described as feeling like 'razor blades', and she experienced significant voice loss after her first COVID infection, resulting in a two-month period of voice loss and subsequent stuttering. Speech therapy was beneficial, and she now uses an amplifier to reduce strain on her throat.  She experiences excessive mucus and occasional blood-tinged sputum, attributed to possible nasal bleeding, with the last occurrence about two weeks ago. Occasional chest pressure, described as feeling like 'someone is sitting on my chest', occurs once or twice a year, often while lying down.  Her past medical history includes asthma, allergic rhinitis, obstructive sleep apnea (not on CPAP), GERD, sickle cell trait, and a remote history of thyroid  cancer treated surgically and with radioactive iodine. 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  She has undergone spirometry testing, most recently on August 24, 2024, which showed normal airflows. She has a history of bariatric surgery and is a never smoker. She lives locally in [place], has no known occupational exposures, and has not been exposed to significant asbestos, although she did remove popcorn ceilings in her home about six to seven years ago.  PET scan from 08/12/2024 and prior imaging reviewed by me  Results RADIOLOGY PET scan: No lytic lesions, subtle clustered micronodular inflammation in right upper lobe, discrete nodules <1 cm, mild metabolic activity in right axillary nodes (08/12/2024)  DIAGNOSTIC Spirometry: Normal airflows (08/24/2024)    Review of Systems As per HPI  Past Medical History:  Diagnosis Date   Allergy    Anemia    Arthritis    knees  AND SHOULDERS   Asthma    allergy related - rarely uses inhaler   Bruises easily    Bunion    Cancer (HCC) 1995   1995;thyroid  cancer, SURGERY AND RADIOACTIVE IODINE   Ectopic pregnancy    Euthyroid    Fatigue    Fibromyalgia    GERD (gastroesophageal reflux disease)    Headache(784.0)    otc med prn - last one 02/2014   History of blood transfusion 2000   In [place], [place] at [facility] - ? 4 units transfused   Hx of adenomatous and sessile serrated colonic polyps 12/24/2022   Diminutive ssp and adenoma 3/24 - recall 2031   Hypothyroidism    Knee pain    Nausea and vomiting 09/27/2023   Plantar fasciitis    Sleep apnea    uses  CPAP   SVD (spontaneous vaginal delivery)    x 3   Thyroid  cancer (HCC)    per pt 1995, thyroid  was removed   Thyroid  disease    thyroidectomy 1995    Family History  Problem Relation Age of Onset   Hypertension Mother    Cancer Mother        uterine   Cancer Father    Cancer Brother        stomach; 80   Early death Brother    Asthma Brother    Stroke Maternal Uncle    Heart disease Maternal Uncle    Food  Allergy Daughter    Allergic rhinitis Daughter    Allergic rhinitis Daughter    Angioedema Neg Hx    Eczema Neg Hx    Immunodeficiency Neg Hx    Urticaria Neg Hx    Atopy Neg Hx    Colon cancer Neg Hx    Rectal cancer Neg Hx      Social History   Socioeconomic History   Marital status: Married    Spouse name: Not on file   Number of children: 3   Years of education: Not on file   Highest education level: Not on file  Occupational History   Not on file  Tobacco Use   Smoking status: Never    Passive exposure: Past   Smokeless tobacco: Never  Vaping Use   Vaping status: Never Used  Substance and Sexual Activity   Alcohol use: Yes    Alcohol/week: 0.0 standard drinks of alcohol    Comment: occ   Drug use: No   Sexual activity: Yes    Birth control/protection: Surgical  Other Topics Concern   Not on file  Social History Narrative   Not on file   Social Drivers of Health   Tobacco Use: Low Risk (10/07/2024)   Patient History    Smoking Tobacco Use: Never    Smokeless Tobacco Use: Never    Passive Exposure: Past  Financial Resource Strain: Not on file  Food Insecurity: No Food Insecurity (09/01/2023)   Received from [facility]   Epic    Within the past 12 months, you worried that your food would run out before you got the money to buy more.: Never true    Within the past 12 months, the food you bought just didn't last and you didn't have money to get more.: Never true  Transportation Needs: No Transportation Needs (09/01/2023)   Received from [facility] - Transportation    Lack of Transportation (Medical): No    Lack of Transportation (Non-Medical): No  Physical Activity: Not on file  Stress: No Stress Concern Present (09/01/2023)   Received from [facility] - Occupational Stress Questionnaire    Feeling of Stress : Only a little  Social Connections: Not on file  Intimate Partner Violence: Not At Risk  (09/16/2023)   Received from [facility]   HITS    Over the last 12 months how often did your partner physically hurt you?: Never    Over the last 12 months how often did your partner insult you or talk down to you?: Never    Over the last 12 months how often did your partner threaten you with physical harm?: Never    Over the last 12 months how often did your partner scream or curse at you?: Never  Depression (PHQ2-9): Low Risk (09/28/2024)  Depression (PHQ2-9)    PHQ-2 Score: 0  Alcohol Screen: Not on file  Housing: Unknown (12/09/2023)   Received from [facility]   Epic    Unable to Pay for Housing in the Last Year: Not on file    Number of Times Moved in the Last Year: Not on file    At any time in the past 12 months, were you homeless or living in a shelter (including now)?: No  Utilities: Not At Risk (09/01/2023)   Received from [facility] Utilities    Threatened with loss of utilities: No  Health Literacy: Not on file  She works as a runner, broadcasting/film/video No occupational exposures Has always lived in [place]  No military, no TB exposure or positive test. No animals, no mold exposure or water damage  Allergies[1]  Medications Ordered Prior to Encounter[2]     Objective:    Vitals:   10/07/24 0829  BP: 124/74  Pulse: 74  SpO2: 100%  Weight: 232 lb 9.6 oz (105.5 kg)  Height: 5' 5 (1.651 m)   Physical Exam Gen: Pleasant, well-nourished, in no distress,  normal affect  ENT: No lesions,  mouth clear,  oropharynx clear, no postnasal drip  Neck: No JVD, no stridor  Lungs: No use of accessory muscles, no crackles or wheezing on normal respiration, no wheeze on forced expiration  Cardiovascular: RRR, heart sounds normal, no murmur or gallops, no peripheral edema  Musculoskeletal: No deformities, no cyanosis or clubbing  Neuro: alert, awake, non focal  Skin: Warm, no lesions or rashes       Assessment & Plan:   Assessment &  Plan Abnormal magnetic resonance imaging of chest  Pulmonary nodules   Assessment and Plan Assessment & Plan Pulmonary nodules with possible residual inflammation Subtle clustered tree-in-bud nodularity in the right upper lobe with discrete nodules <1 cm on PET scan. Possible residual inflammation or infection. Differential includes benign inflammatory processes, infection, or less likely, malignancy. Normal spirometry. Occasional hemoptysis and epistaxis possibly related to upper airway disease. - Ordered CT scan of the chest for mid-January to assess pulmonary nodules. - Will review CT scan results to determine need for further testing or follow-up. - Will coordinate with [facility] to obtain PET scan images for comparison. - Will discuss potential need for further testing if nodules correlate with significant findings.  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    Return in about 7 weeks (around 11/24/2024) for With Dr. Shelah, To review CT scan of the chest.   Lamar Shelah, MD, PhD 10/07/2024, 9:07 AM [facility] [phone] or if no answer before 7:00PM call [phone] For any issues after 7:00PM please call eLink [phone]      [1]  Allergies Allergen Reactions   Sulfa Antibiotics Swelling   Amoxicillin Other (See Comments)    Headache Has patient had a PCN reaction  causing immediate rash, facial/tongue/throat swelling, SOB or lightheadedness with hypotension: No Has  patient had a PCN reaction causing severe rash involving mucus membranes or skin necrosis: No Has patient had a PCN reaction that required hospitalization: No Has patient had a PCN reaction occurring within the last 10 years: Unknown If all of the above answers are NO, then may proceed with Cephalosporin use.    Nitrofurantoin Monohyd Macro Other (See Comments)    Pt stated she gets a bad yeast infection   Trimethoprim Other (See Comments)   Dexilant [Dexlansoprazole]     bad stomachache   Meloxicam      Upset stomach and stomach pains   Budesonide Rash   Formoterol Rash   Formoterol Fumarate Rash  [2]  Current Outpatient Medications on File Prior to Visit  Medication Sig Dispense Refill   albuterol  (VENTOLIN  HFA) 108 (90 Base) MCG/ACT inhaler Inhale 2 puffs into the lungs every 6 (six) hours as needed for wheezing or shortness of breath. 54 g 3   EPINEPHrine  (AUVI-Q ) 0.3 mg/0.3 mL IJ SOAJ injection Inject 0.3 mg into the muscle as needed for anaphylaxis. As directed for life-threatening allergic reactions 2 each 2   glucose blood (CONTOUR TEST) test strip Use as directed to check blood sugars once daily E11.69 100 each 2   hydrocortisone  (ANUSOL -HC) 2.5 % rectal cream Place 1 Application rectally 2 (two) times daily. 30 g 0   ipratropium (ATROVENT ) 0.06 % nasal spray 1-2 sprays in each nostril up to three times per day as needed for runny nose/post nasal drip 15 mL 5   levocetirizine (XYZAL ) 5 MG tablet Take 1 tablet (5 mg total) by mouth every evening. 90 tablet 1   levothyroxine  (SYNTHROID ) 112 MCG tablet TAKE 1 TABLET(112 MCG) BY MOUTH DAILY BEFORE BREAKFAST 90 tablet 2   methylcellulose (CITRUCEL) oral powder Take 1 packet by mouth daily.     Microlet Lancets MISC Use as directed to check blood sugars once daily E11.69 100 each 2   MOUNJARO 2.5 MG/0.5ML Pen SMARTSIG:2.5  Milligram(s) SUB-Q Once a Week     polyethylene glycol (MIRALAX ) 17 g packet Take 17 g by mouth daily.     No current facility-administered medications on file prior to visit.

## 2024-10-09 DIAGNOSIS — K5903 Drug induced constipation: Secondary | ICD-10-CM | POA: Insufficient documentation

## 2024-10-09 NOTE — Assessment & Plan Note (Addendum)
 TSH slightly low and free T4 high. - Monitor thyroid  function tests regularly. - Report any symptoms of hyperthyroidism such as palpitations or diarrhea. - Followed by Dr. Balan

## 2024-10-09 NOTE — Assessment & Plan Note (Signed)
 A1c improved to 5.5% on Mounjaro 2.5 mg, previously 6.5 many years ago prior to her bariatric surgery. Will request Dr. Becki notes, I suspect she has to use DM diagnosis to get Mounjaro approved.  - Continue Mounjaro 2.5 mg. - Ensure adequate protein intake and hydration. - Encourage strengthening exercises to prevent muscle loss. - Monitor A1c levels regularly.

## 2024-10-09 NOTE — Assessment & Plan Note (Addendum)
 Constipation associated with Mounjaro use, managed with Miralax . - Continue Miralax  for constipation management. - Ensure adequate fiber and hydration intake.

## 2024-10-09 NOTE — Assessment & Plan Note (Signed)
 BMI 38 with underlying prediabetes, myeloma. Now on tirzepatide to help augment her weight loss efforts. Encouraged to strive to lose ten percent of her body weight to decrease cardiac risk.

## 2024-10-09 NOTE — Assessment & Plan Note (Addendum)
 Intermittent hemoptysis with right lung abnormalities on PET scan. Possible infection or inflammation. Previous antibiotic course incomplete, potential resistance. - Follow up with pulmonologist on December 11th. - Ensure completion of antibiotic courses to prevent resistance.

## 2024-10-09 NOTE — Assessment & Plan Note (Addendum)
 PET scan showed no increased metabolic activity suggestive of myeloma progression. Indeterminate activity in left hip, right hip pain reported. Potential inflammation or infection in right lung noted. - Continue surveillance with regular follow-up scans. - Monitor for symptoms of infection or inflammation.

## 2024-10-12 ENCOUNTER — Ambulatory Visit: Admitting: Podiatry

## 2024-11-05 ENCOUNTER — Encounter: Payer: Self-pay | Admitting: Internal Medicine

## 2024-11-09 ENCOUNTER — Encounter: Payer: Self-pay | Admitting: Internal Medicine

## 2024-11-10 ENCOUNTER — Other Ambulatory Visit

## 2024-11-11 ENCOUNTER — Ambulatory Visit
Admission: RE | Admit: 2024-11-11 | Discharge: 2024-11-11 | Disposition: A | Source: Ambulatory Visit | Attending: Emergency Medicine | Admitting: Emergency Medicine

## 2024-11-11 DIAGNOSIS — R9389 Abnormal findings on diagnostic imaging of other specified body structures: Secondary | ICD-10-CM

## 2024-11-11 DIAGNOSIS — R918 Other nonspecific abnormal finding of lung field: Secondary | ICD-10-CM

## 2024-11-16 ENCOUNTER — Other Ambulatory Visit: Payer: Self-pay

## 2024-11-16 DIAGNOSIS — D472 Monoclonal gammopathy: Secondary | ICD-10-CM

## 2024-11-17 ENCOUNTER — Ambulatory Visit: Admitting: Allergy

## 2024-11-19 ENCOUNTER — Other Ambulatory Visit: Payer: Self-pay

## 2024-11-19 DIAGNOSIS — D472 Monoclonal gammopathy: Secondary | ICD-10-CM

## 2024-11-24 LAB — IMMUNOFIXATION ELECTROPHORESIS
IgG (Immunoglobin G), Serum: 2129 mg/dL — ABNORMAL HIGH (ref 586–1602)
IgM (Immunoglobulin M), Srm: 27 mg/dL (ref 26–217)
Immunoglobulin A, (IgA) QN, Serum: 171 mg/dL (ref 87–352)
Total Protein: 7.6 g/dL (ref 6.0–8.5)

## 2024-11-24 LAB — PROTEIN ELECTROPHORESIS, SERUM
A/G Ratio: 0.9 (ref 0.7–1.7)
Albumin ELP: 3.6 g/dL (ref 2.9–4.4)
Alpha 1: 0.3 g/dL (ref 0.0–0.4)
Alpha 2: 0.8 g/dL (ref 0.4–1.0)
Beta: 1.1 g/dL (ref 0.7–1.3)
Gamma Globulin: 1.8 g/dL (ref 0.4–1.8)
Globulin, Total: 4 g/dL — ABNORMAL HIGH (ref 2.2–3.9)
M-Spike, %: 1.4 g/dL — ABNORMAL HIGH

## 2024-11-24 LAB — FREE K+L LT CHAINS,QN,S
Ig Kappa Free Light Chain: 32.4 mg/L — ABNORMAL HIGH (ref 3.3–19.4)
Ig Lambda Free Light Chain: 14.3 mg/L (ref 5.7–26.3)
KAPPA/LAMBDA RATIO: 2.27 — ABNORMAL HIGH (ref 0.26–1.65)

## 2024-11-24 LAB — IMMUNOGLOBULINS A/E/G/M, SERUM: IgE (Immunoglobulin E), Serum: 43 [IU]/mL (ref 6–495)

## 2024-11-30 ENCOUNTER — Inpatient Hospital Stay
Admission: RE | Admit: 2024-11-30 | Discharge: 2024-11-30 | Disposition: A | Payer: Self-pay | Source: Ambulatory Visit | Attending: Emergency Medicine | Admitting: Emergency Medicine

## 2024-11-30 ENCOUNTER — Ambulatory Visit: Payer: Self-pay | Admitting: Emergency Medicine

## 2024-11-30 DIAGNOSIS — R918 Other nonspecific abnormal finding of lung field: Secondary | ICD-10-CM

## 2024-11-30 NOTE — Progress Notes (Signed)
 Called and spoke with the pt and advised of RB results note. Pt verbalized understanding & will discuss in detail at upcoming ov.

## 2024-11-30 NOTE — Telephone Encounter (Addendum)
 PET dated 08/12/2024 performed at [facility] has been requested Urgently through [facility].  Routing to [facility] to follow up on request 12/01/2024 Pt has appt 12/02/2024 with RB

## 2024-12-01 NOTE — Telephone Encounter (Signed)
 PET WB with nondiagnostic concurrent CT subsequent FDG from 08/12/2024 completed at [facility] has been uploaded into PACS under the order CT Outside Films Chest  Imaging is available for RB to review at pts ov 2/5. Nothing further needed.

## 2024-12-01 NOTE — Telephone Encounter (Signed)
 Called and spoke with [facility] and they have received images from [facility]. They are currently validating.  Will call back later to check on images being uploaded to the order.

## 2024-12-02 ENCOUNTER — Encounter: Payer: Self-pay | Admitting: Emergency Medicine

## 2024-12-02 ENCOUNTER — Ambulatory Visit: Admitting: Emergency Medicine

## 2024-12-02 VITALS — BP 139/87 | HR 94 | Ht 65.0 in | Wt 232.0 lb

## 2024-12-02 DIAGNOSIS — J4520 Mild intermittent asthma, uncomplicated: Secondary | ICD-10-CM

## 2024-12-02 DIAGNOSIS — R918 Other nonspecific abnormal finding of lung field: Secondary | ICD-10-CM | POA: Insufficient documentation

## 2024-12-02 DIAGNOSIS — J3089 Other allergic rhinitis: Secondary | ICD-10-CM

## 2024-12-02 NOTE — Progress Notes (Signed)
 "  Subjective:    Patient ID: Dana Vasquez, female    DOB: 06/27/1966, 59 y.o.   MRN: [id number]  HPI  ROV 12/02/2024 --59 year old woman, never smoker, with a history of smoldering multiple myeloma, asthma with allergic rhinitis, GERD, untreated OSA, sickle cell trait, history of thyroid  cancer, prior bariatric surgery.  I saw her in December 2025 for pulmonary nodular disease noted on CT scan of the chest and a clustered tree-in-bud pattern, question inflammatory. She describes today . Her breathing is sometimes limiting - she does use albuterol  but not every day. She sometimes has epistaxis, feels dryness. Sometimes uses atrovent  NS occasionally. She is on xyzal .   CT scan of the chest 11/11/2024 reviewed by me, shows scattered pulmonary nodular disease including 3 mm nodule in the left lower lobe, unchanged from 04/2023.  Additional scattered nodules 3 mm or less.  The subtle tree-in-bud nodularity noted on her PET scan has resolved    Review of Systems As per HPI  Past Medical History:  Diagnosis Date   Allergy    Anemia    Arthritis    knees  AND SHOULDERS   Asthma    allergy related - rarely uses inhaler   Bruises easily    Bunion    Cancer (HCC) 1995   1995;thyroid  cancer, SURGERY AND RADIOACTIVE IODINE   Ectopic pregnancy    Euthyroid    Fatigue    Fibromyalgia    GERD (gastroesophageal reflux disease)    Headache(784.0)    otc med prn - last one 02/2014   History of blood transfusion 2000   In [place], [place] at [place] - ? 4 units transfused   Hx of adenomatous and sessile serrated colonic polyps 12/24/2022   Diminutive ssp and adenoma 3/24 - recall 2031   Hypothyroidism    Knee pain    Nausea and vomiting 09/27/2023   Plantar fasciitis    Sleep apnea    uses CPAP   SVD (spontaneous vaginal delivery)    x 3   Thyroid  cancer (HCC)    per pt 1995, thyroid  was removed   Thyroid  disease    thyroidectomy 1995    Family History  Problem Relation Age of Onset    Hypertension Mother    Cancer Mother        uterine   Cancer Father    Cancer Brother        stomach; 40   Early death Brother    Asthma Brother    Stroke Maternal Uncle    Heart disease Maternal Uncle    Food Allergy Daughter    Allergic rhinitis Daughter    Allergic rhinitis Daughter    Angioedema Neg Hx    Eczema Neg Hx    Immunodeficiency Neg Hx    Urticaria Neg Hx    Atopy Neg Hx    Colon cancer Neg Hx    Rectal cancer Neg Hx      Social History   Socioeconomic History   Marital status: Married    Spouse name: Not on file   Number of children: 3   Years of education: Not on file   Highest education level: Not on file  Occupational History   Not on file  Tobacco Use   Smoking status: Never    Passive exposure: Past   Smokeless tobacco: Never  Vaping Use   Vaping status: Never Used  Substance and Sexual Activity   Alcohol use: Yes    Alcohol/week: 0.0 standard  drinks of alcohol    Comment: occ   Drug use: No   Sexual activity: Yes    Birth control/protection: Surgical  Other Topics Concern   Not on file  Social History Narrative   Not on file   Social Drivers of Health   Tobacco Use: Low Risk (12/02/2024)   Patient History    Smoking Tobacco Use: Never    Smokeless Tobacco Use: Never    Passive Exposure: Past  Financial Resource Strain: Not on file  Food Insecurity: No Food Insecurity (09/01/2023)   Received from [facility]   Epic    Within the past 12 months, you worried that your food would run out before you got the money to buy more.: Never true    Within the past 12 months, the food you bought just didn't last and you didn't have money to get more.: Never true  Transportation Needs: No Transportation Needs (09/01/2023)   [facility] - Transportation    Lack of Transportation (Medical): No    Lack of Transportation (Non-Medical): No  Physical Activity: Not on file  Stress: No Stress Concern Present (09/01/2023)    Received from [facility] - Occupational Stress Questionnaire    Feeling of Stress : Only a little  Social Connections: Not on file  Intimate Partner Violence: Not At Risk (09/16/2023)   Received from [facility]   HITS    Over the last 12 months how often did your partner physically hurt you?: Never    Over the last 12 months how often did your partner insult you or talk down to you?: Never    Over the last 12 months how often did your partner threaten you with physical harm?: Never    Over the last 12 months how often did your partner scream or curse at you?: Never  Depression (PHQ2-9): Low Risk (09/28/2024)   Depression (PHQ2-9)    PHQ-2 Score: 0  Alcohol Screen: Not on file  Housing: Unknown (12/09/2023)   Received from [facility]   Epic    Unable to Pay for Housing in the Last Year: Not on file    Number of Times Moved in the Last Year: Not on file    At any time in the past 12 months, were you homeless or living in a shelter (including now)?: No  Utilities: Not At Risk (09/01/2023)   Received from [facility] Utilities    Threatened with loss of utilities: No  Health Literacy: Not on file  She works as a runner, broadcasting/film/video No occupational exposures Has always lived in [place]  No military, no TB exposure or positive test. No animals, no mold exposure or water damage  Allergies[1]  Medications Ordered Prior to Encounter[2]     Objective:    Vitals:   12/02/24 1538  BP: 139/87  Pulse: 94  SpO2: 97%  Weight: 232 lb (105.2 kg)  Height: 5' 5 (1.651 m)   Physical Exam Gen: Pleasant, well-nourished, in no distress,  normal affect  ENT: No lesions,  mouth clear,  oropharynx clear, no postnasal drip  Neck: No JVD, no stridor  Lungs: No use of accessory muscles, no crackles or wheezing on normal respiration, no wheeze on forced expiration  Cardiovascular: RRR, heart sounds normal, no murmur or  gallops, no peripheral edema  Musculoskeletal: No deformities, no cyanosis or clubbing  Neuro: alert, awake, non focal  Skin:  Warm, no lesions or rashes       Assessment & Plan:   Assessment & Plan Pulmonary nodules Reassuring CT chest.  No evidence of tree-in-bud nodularity as was suggested by her previous PET scan.  There were some small scattered punctate nodules that are likely benign.  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  If stable at that time we can probably stop following dedicated scans. Mild intermittent asthma without complication Okay to keep your albuterol  available to use 2 puffs when needed for shortness of breath, chest tightness, wheezing. Allergic rhinitis Overall doing okay but she is seeing some epistaxis.  She uses Atrovent  nasal spray which can be very drying.  Discussed this with her and possible use of nasal saline today.  Continue your Xyzal  and your Atrovent  nasal spray as you have been using them.  You might want to try using nasal saline or saline jelly to ensure that your nasal passages stay moist.  This might prevent bleeding    Return in about 1 year (around 12/02/2025) for With Dr. Shelah, To review CT scan of the chest.   I personally spent a total of 25 minutes in the care of the patient today including preparing to see the patient, getting/reviewing separately obtained history, performing a medically appropriate exam/evaluation, counseling and educating, placing orders, documenting clinical information in the EHR, independently interpreting results, and communicating results.   Lamar Shelah, MD, PhD 12/02/2024, 5:08 PM [facility] [phone] or if no answer before 7:00PM call [phone] For any issues after 7:00PM please call eLink [phone]        [1]  Allergies Allergen Reactions   Sulfa Antibiotics Swelling   Amoxicillin Other (See Comments)     Headache Has patient had a PCN reaction causing immediate rash, facial/tongue/throat swelling, SOB or lightheadedness with hypotension: No Has patient had a PCN reaction causing severe rash involving mucus membranes or skin necrosis: No Has patient had a PCN reaction that required hospitalization: No Has patient had a PCN reaction occurring within the last 10 years: Unknown If all of the above answers are NO, then may proceed with Cephalosporin use.    Nitrofurantoin Monohyd Macro Other (See Comments)    Pt stated she gets a bad yeast infection   Trimethoprim Other (See Comments)   Dexilant [Dexlansoprazole]     bad stomachache   Meloxicam      Upset stomach and stomach pains   Budesonide Rash   Formoterol Rash   Formoterol Fumarate Rash  [2]  Current Outpatient Medications on File Prior to Visit  Medication Sig Dispense Refill   albuterol  (VENTOLIN  HFA) 108 (90 Base) MCG/ACT inhaler Inhale 2 puffs into the lungs every 6 (six) hours as needed for wheezing or shortness of breath. 54 g 3   EPINEPHrine  (AUVI-Q ) 0.3 mg/0.3 mL IJ SOAJ injection Inject 0.3 mg into the muscle as needed for anaphylaxis. As directed for life-threatening allergic reactions 2 each 2   glucose blood (CONTOUR TEST) test strip Use as directed to check blood sugars once daily E11.69 100 each 2   hydrocortisone  (ANUSOL -HC) 2.5 % rectal cream Place 1 Application rectally 2 (two) times daily. 30 g 0   ipratropium (ATROVENT ) 0.06 % nasal spray 1-2 sprays in each nostril up to three times per day as needed for runny nose/post nasal drip 15 mL 5   levocetirizine (XYZAL ) 5 MG tablet Take 1 tablet (5 mg total) by  mouth every evening. 90 tablet 1   levothyroxine  (SYNTHROID ) 112 MCG tablet TAKE 1 TABLET(112 MCG) BY MOUTH DAILY BEFORE BREAKFAST 90 tablet 2   methylcellulose (CITRUCEL) oral powder Take 1 packet by mouth daily.     Microlet Lancets MISC Use as directed to check blood sugars once daily E11.69 100 each 2    MOUNJARO 2.5 MG/0.5ML Pen SMARTSIG:2.5 Milligram(s) SUB-Q Once a Week     polyethylene glycol (MIRALAX ) 17 g packet Take 17 g by mouth daily.     No current facility-administered medications on file prior to visit.   "

## 2024-12-02 NOTE — Assessment & Plan Note (Addendum)
 Okay to keep your albuterol  available to use 2 puffs when needed for shortness of breath, chest tightness, wheezing.

## 2024-12-02 NOTE — Assessment & Plan Note (Addendum)
 Overall doing okay but she is seeing some epistaxis.  She uses Atrovent  nasal spray which can be very drying.  Discussed this with her and possible use of nasal saline today.  Continue your Xyzal  and your Atrovent  nasal spray as you have been using them.  You might want to try using nasal saline or saline jelly to ensure that your nasal passages stay moist.  This might prevent bleeding

## 2024-12-02 NOTE — Patient Instructions (Addendum)
 We reviewed your CT chest today. This was a reassuring study. We will plan to repeat your CT scan of the chest in 1 year, January 2027. Okay to keep your albuterol  available to use 2 puffs when needed for shortness of breath, chest tightness, wheezing. Continue your Xyzal  and your Atrovent  nasal spray as you have been using them.  You might want to try using nasal saline or saline jelly to ensure that your nasal passages stay moist.  This might prevent bleeding Follow Dr. Shelah in January 2027 so we can review your CT.  Please call if you have any problems so we can see you sooner

## 2024-12-02 NOTE — Assessment & Plan Note (Addendum)
 Reassuring CT chest.  No evidence of tree-in-bud nodularity as was suggested by her previous PET scan.  There were some small scattered punctate nodules that are likely benign.  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  If stable at that time we can probably stop following dedicated scans.

## 2025-01-31 ENCOUNTER — Ambulatory Visit: Admitting: Internal Medicine

## 2025-02-24 ENCOUNTER — Ambulatory Visit: Admitting: Allergy

## 2025-04-27 ENCOUNTER — Encounter: Payer: Self-pay | Admitting: Internal Medicine
